# Patient Record
Sex: Female | Born: 1960 | Race: White | Hispanic: No | Marital: Single | Smoking: Former smoker
Health system: Southern US, Community
[De-identification: ages and names within clinical notes are randomized; demographics above are authoritative.]

## PROBLEM LIST (undated history)

## (undated) DIAGNOSIS — R3915 Urgency of urination: Secondary | ICD-10-CM

## (undated) DIAGNOSIS — I340 Nonrheumatic mitral (valve) insufficiency: Secondary | ICD-10-CM

## (undated) DIAGNOSIS — I739 Peripheral vascular disease, unspecified: Secondary | ICD-10-CM

## (undated) DIAGNOSIS — I2510 Atherosclerotic heart disease of native coronary artery without angina pectoris: Secondary | ICD-10-CM

## (undated) DIAGNOSIS — Z8669 Personal history of other diseases of the nervous system and sense organs: Secondary | ICD-10-CM

## (undated) DIAGNOSIS — K219 Gastro-esophageal reflux disease without esophagitis: Secondary | ICD-10-CM

## (undated) DIAGNOSIS — I071 Rheumatic tricuspid insufficiency: Secondary | ICD-10-CM

## (undated) DIAGNOSIS — R350 Frequency of micturition: Secondary | ICD-10-CM

## (undated) DIAGNOSIS — I5032 Chronic diastolic (congestive) heart failure: Secondary | ICD-10-CM

## (undated) DIAGNOSIS — G8929 Other chronic pain: Secondary | ICD-10-CM

## (undated) DIAGNOSIS — F419 Anxiety disorder, unspecified: Secondary | ICD-10-CM

## (undated) DIAGNOSIS — E785 Hyperlipidemia, unspecified: Secondary | ICD-10-CM

## (undated) DIAGNOSIS — F32A Depression, unspecified: Secondary | ICD-10-CM

## (undated) DIAGNOSIS — F329 Major depressive disorder, single episode, unspecified: Secondary | ICD-10-CM

## (undated) DIAGNOSIS — I10 Essential (primary) hypertension: Secondary | ICD-10-CM

## (undated) DIAGNOSIS — M1990 Unspecified osteoarthritis, unspecified site: Secondary | ICD-10-CM

## (undated) DIAGNOSIS — G4733 Obstructive sleep apnea (adult) (pediatric): Secondary | ICD-10-CM

## (undated) DIAGNOSIS — M549 Dorsalgia, unspecified: Secondary | ICD-10-CM

## (undated) DIAGNOSIS — F4310 Post-traumatic stress disorder, unspecified: Secondary | ICD-10-CM

## (undated) DIAGNOSIS — R42 Dizziness and giddiness: Secondary | ICD-10-CM

## (undated) DIAGNOSIS — Z8679 Personal history of other diseases of the circulatory system: Secondary | ICD-10-CM

## (undated) HISTORY — DX: Rheumatic tricuspid insufficiency: I07.1

## (undated) HISTORY — DX: Chronic diastolic (congestive) heart failure: I50.32

## (undated) HISTORY — DX: Depression, unspecified: F32.A

## (undated) HISTORY — DX: Peripheral vascular disease, unspecified: I73.9

## (undated) HISTORY — DX: Atherosclerotic heart disease of native coronary artery without angina pectoris: I25.10

## (undated) HISTORY — DX: Essential (primary) hypertension: I10

## (undated) HISTORY — PX: KNEE ARTHROSCOPY: SUR90

## (undated) HISTORY — DX: Unspecified osteoarthritis, unspecified site: M19.90

## (undated) HISTORY — DX: Anxiety disorder, unspecified: F41.9

## (undated) HISTORY — DX: Major depressive disorder, single episode, unspecified: F32.9

## (undated) HISTORY — DX: Hyperlipidemia, unspecified: E78.5

## (undated) HISTORY — DX: Nonrheumatic mitral (valve) insufficiency: I34.0

## (undated) HISTORY — DX: Personal history of other diseases of the circulatory system: Z86.79

## (undated) HISTORY — DX: Obstructive sleep apnea (adult) (pediatric): G47.33

---

## 1989-11-09 HISTORY — PX: TUBAL LIGATION: SHX77

## 2007-11-10 HISTORY — PX: CARDIAC CATHETERIZATION: SHX172

## 2008-08-10 ENCOUNTER — Inpatient Hospital Stay (HOSPITAL_COMMUNITY): Admission: EM | Admit: 2008-08-10 | Discharge: 2008-08-13 | Payer: Self-pay | Admitting: Emergency Medicine

## 2008-08-13 ENCOUNTER — Encounter (INDEPENDENT_AMBULATORY_CARE_PROVIDER_SITE_OTHER): Payer: Self-pay | Admitting: *Deleted

## 2008-11-09 HISTORY — PX: LUMBAR DISC SURGERY: SHX700

## 2008-12-21 ENCOUNTER — Encounter: Admission: RE | Admit: 2008-12-21 | Discharge: 2008-12-21 | Payer: Self-pay | Admitting: Obstetrics and Gynecology

## 2009-04-15 ENCOUNTER — Encounter: Admission: RE | Admit: 2009-04-15 | Discharge: 2009-04-15 | Payer: Self-pay | Admitting: Family Medicine

## 2009-04-29 ENCOUNTER — Emergency Department (HOSPITAL_COMMUNITY): Admission: EM | Admit: 2009-04-29 | Discharge: 2009-04-30 | Payer: Self-pay | Admitting: Emergency Medicine

## 2009-05-28 ENCOUNTER — Ambulatory Visit: Payer: Self-pay | Admitting: *Deleted

## 2009-05-29 ENCOUNTER — Ambulatory Visit: Payer: Self-pay | Admitting: Vascular Surgery

## 2009-06-04 ENCOUNTER — Ambulatory Visit (HOSPITAL_COMMUNITY): Admission: RE | Admit: 2009-06-04 | Discharge: 2009-06-04 | Payer: Self-pay | Admitting: Psychology

## 2009-06-04 ENCOUNTER — Ambulatory Visit: Payer: Self-pay | Admitting: Surgery

## 2009-06-09 HISTORY — PX: ILIAC ARTERY STENT: SHX1786

## 2009-06-11 ENCOUNTER — Ambulatory Visit (HOSPITAL_COMMUNITY): Admission: RE | Admit: 2009-06-11 | Discharge: 2009-06-11 | Payer: Self-pay | Admitting: Surgery

## 2009-06-11 DIAGNOSIS — I739 Peripheral vascular disease, unspecified: Secondary | ICD-10-CM

## 2009-06-11 HISTORY — DX: Peripheral vascular disease, unspecified: I73.9

## 2009-07-12 ENCOUNTER — Ambulatory Visit: Payer: Self-pay | Admitting: Vascular Surgery

## 2009-07-27 ENCOUNTER — Encounter: Admission: RE | Admit: 2009-07-27 | Discharge: 2009-07-27 | Payer: Self-pay | Admitting: Orthopedic Surgery

## 2010-05-30 ENCOUNTER — Emergency Department (HOSPITAL_COMMUNITY): Admission: EM | Admit: 2010-05-30 | Discharge: 2010-05-30 | Payer: Self-pay | Admitting: Emergency Medicine

## 2010-09-13 ENCOUNTER — Emergency Department (HOSPITAL_COMMUNITY): Admission: EM | Admit: 2010-09-13 | Discharge: 2010-09-13 | Payer: Self-pay | Admitting: Emergency Medicine

## 2011-01-09 ENCOUNTER — Ambulatory Visit (INDEPENDENT_AMBULATORY_CARE_PROVIDER_SITE_OTHER): Payer: Medicare Other | Admitting: Cardiovascular Disease

## 2011-01-09 DIAGNOSIS — R002 Palpitations: Secondary | ICD-10-CM

## 2011-01-13 ENCOUNTER — Ambulatory Visit (INDEPENDENT_AMBULATORY_CARE_PROVIDER_SITE_OTHER): Payer: Medicare Other | Admitting: Nurse Practitioner

## 2011-01-13 DIAGNOSIS — R002 Palpitations: Secondary | ICD-10-CM

## 2011-01-13 DIAGNOSIS — I4891 Unspecified atrial fibrillation: Secondary | ICD-10-CM

## 2011-01-20 LAB — DIFFERENTIAL
Lymphocytes Relative: 33 % (ref 12–46)
Monocytes Absolute: 0.5 10*3/uL (ref 0.1–1.0)
Monocytes Relative: 6 % (ref 3–12)
Neutro Abs: 5.1 10*3/uL (ref 1.7–7.7)

## 2011-01-20 LAB — BASIC METABOLIC PANEL
GFR calc non Af Amer: >60 mL/min (ref >60)
Glucose, Bld: 93 mg/dL (ref 70–99)
Potassium: 4.2 mEq/L (ref 3.5–5.1)
Sodium: 140 mEq/L (ref 135–145)

## 2011-01-20 LAB — CBC
HCT: 46.1 % — ABNORMAL HIGH (ref 36.0–46.0)
Hemoglobin: 15.3 g/dL — ABNORMAL HIGH (ref 12.0–15.0)
MCHC: 33.2 g/dL (ref 30.0–36.0)

## 2011-01-20 LAB — POCT CARDIAC MARKERS
CKMB, poc: <1 ng/mL — ABNORMAL LOW (ref 1.0–8.0)
Myoglobin, poc: 44.5 ng/mL (ref 12–200)
Troponin i, poc: <0.05 ng/mL (ref 0.00–0.09)

## 2011-01-21 ENCOUNTER — Encounter: Payer: Self-pay | Admitting: Cardiovascular Disease

## 2011-01-21 ENCOUNTER — Other Ambulatory Visit (HOSPITAL_COMMUNITY): Payer: Medicare Other

## 2011-01-24 ENCOUNTER — Emergency Department (HOSPITAL_COMMUNITY)
Admission: EM | Admit: 2011-01-24 | Discharge: 2011-01-25 | Disposition: A | Discharge status: Home / Self Care | Payer: Medicare Other | Attending: Emergency Medicine | Admitting: Emergency Medicine

## 2011-01-24 DIAGNOSIS — R112 Nausea with vomiting, unspecified: Secondary | ICD-10-CM | POA: Insufficient documentation

## 2011-01-24 DIAGNOSIS — R1013 Epigastric pain: Secondary | ICD-10-CM | POA: Insufficient documentation

## 2011-01-24 DIAGNOSIS — I4891 Unspecified atrial fibrillation: Secondary | ICD-10-CM | POA: Insufficient documentation

## 2011-01-24 DIAGNOSIS — T50995A Adverse effect of other drugs, medicaments and biological substances, initial encounter: Secondary | ICD-10-CM | POA: Insufficient documentation

## 2011-01-24 DIAGNOSIS — K2970 Gastritis, unspecified, without bleeding: Secondary | ICD-10-CM | POA: Insufficient documentation

## 2011-01-25 LAB — DIFFERENTIAL
Basophils Absolute: 0.1 10*3/uL (ref 0.0–0.1)
Lymphocytes Relative: 34 % (ref 12–46)
Neutro Abs: 5.3 10*3/uL (ref 1.7–7.7)

## 2011-01-25 LAB — CBC
HCT: 41.3 % (ref 36.0–46.0)
Hemoglobin: 14.1 g/dL (ref 12.0–15.0)
MCH: 29.6 pg (ref 26.0–34.0)
MCHC: 34.1 g/dL (ref 30.0–36.0)
MCV: 86.6 fL (ref 78.0–100.0)
Platelets: 220 K/uL (ref 150–400)
RBC: 4.77 MIL/uL (ref 3.87–5.11)
RDW: 13.9 % (ref 11.5–15.5)
WBC: 9 K/uL (ref 4.0–10.5)

## 2011-01-25 LAB — HEPATIC FUNCTION PANEL
AST: 16 U/L (ref 0–37)
Albumin: 3.7 g/dL (ref 3.5–5.2)
Alkaline Phosphatase: 67 U/L (ref 39–117)
Total Bilirubin: <0.1 mg/dL — ABNORMAL LOW (ref 0.3–1.2)
Total Protein: 6.2 g/dL (ref 6.0–8.3)

## 2011-01-25 LAB — POCT I-STAT, CHEM 8
HCT: 43 % (ref 36.0–46.0)
Hemoglobin: 14.6 g/dL (ref 12.0–15.0)
Potassium: 4 mEq/L (ref 3.5–5.1)
Sodium: 137 mEq/L (ref 135–145)

## 2011-02-09 ENCOUNTER — Ambulatory Visit: Payer: Medicare Other | Admitting: Cardiovascular Disease

## 2011-02-09 ENCOUNTER — Encounter: Payer: Self-pay | Admitting: *Deleted

## 2011-02-14 LAB — CREATININE, SERUM
Creatinine, Ser: 0.72 mg/dL (ref 0.4–1.2)
GFR calc non Af Amer: >60 mL/min (ref >60)

## 2011-02-14 LAB — BUN: BUN: 18 mg/dL (ref 6–23)

## 2011-02-14 LAB — POCT I-STAT 4, (NA,K, GLUC, HGB,HCT)
Glucose, Bld: 99 mg/dL (ref 70–99)
HCT: 50 % — ABNORMAL HIGH (ref 36.0–46.0)
Sodium: 139 mEq/L (ref 135–145)

## 2011-02-15 LAB — POCT I-STAT, CHEM 8
Calcium, Ion: 0.96 mmol/L — ABNORMAL LOW (ref 1.12–1.32)
Chloride: 108 mEq/L (ref 96–112)
Creatinine, Ser: 0.8 mg/dL (ref 0.4–1.2)
Glucose, Bld: 96 mg/dL (ref 70–99)
HCT: 46 % (ref 36.0–46.0)
Hemoglobin: 15.6 g/dL — ABNORMAL HIGH (ref 12.0–15.0)
Potassium: 3.6 mEq/L (ref 3.5–5.1)

## 2011-02-16 LAB — DIFFERENTIAL
Basophils Relative: 0 % (ref 0–1)
Lymphocytes Relative: 20 % (ref 12–46)
Lymphs Abs: 2.6 10*3/uL (ref 0.7–4.0)
Monocytes Absolute: 0.6 10*3/uL (ref 0.1–1.0)
Monocytes Relative: 5 % (ref 3–12)
Neutro Abs: 9.5 10*3/uL — ABNORMAL HIGH (ref 1.7–7.7)
Neutrophils Relative %: 74 % (ref 43–77)

## 2011-02-16 LAB — POCT I-STAT, CHEM 8
HCT: 47 % — ABNORMAL HIGH (ref 36.0–46.0)
Hemoglobin: 16 g/dL — ABNORMAL HIGH (ref 12.0–15.0)
Potassium: 3.9 mEq/L (ref 3.5–5.1)
Sodium: 139 mEq/L (ref 135–145)
TCO2: 24 mmol/L (ref 0–100)

## 2011-02-16 LAB — URINE MICROSCOPIC-ADD ON

## 2011-02-16 LAB — CBC
Hemoglobin: 15.2 g/dL — ABNORMAL HIGH (ref 12.0–15.0)
RBC: 4.94 MIL/uL (ref 3.87–5.11)
WBC: 12.9 10*3/uL — ABNORMAL HIGH (ref 4.0–10.5)

## 2011-02-16 LAB — URINALYSIS, ROUTINE W REFLEX MICROSCOPIC
Bilirubin Urine: NEGATIVE
Nitrite: NEGATIVE
Specific Gravity, Urine: 1.026 (ref 1.005–1.030)
pH: 6 (ref 5.0–8.0)

## 2011-02-20 ENCOUNTER — Telehealth: Payer: Self-pay | Admitting: Cardiovascular Disease

## 2011-02-20 NOTE — Telephone Encounter (Signed)
Pt c/o leg pain top of groin and now noticed three lumps in her groin. Pt told to get in with pcp or go to walk in clinic if pcp not available. Pt verbalized understanding. Alfonso Ramus RN

## 2011-03-12 ENCOUNTER — Ambulatory Visit: Payer: Medicare Other | Admitting: Cardiovascular Disease

## 2011-03-24 NOTE — Cardiovascular Report (Signed)
NAME:  Kayla Hopkins, Kayla Hopkins            ACCOUNT NO.:  [id number]   MEDICAL RECORD NO.:  [id number]          PATIENT TYPE:  INP   LOCATION:  3712                         FACILITY:  [facility]   PHYSICIAN:  Elmore Guise., M.D.DATE OF BIRTH:  04/13/1961   DATE OF PROCEDURE:  08/13/2008  DATE OF DISCHARGE:  08/13/2008                            CARDIAC CATHETERIZATION   DATE OF PROCEDURE:  August 13, 2008   Indications for procedure is continued chest pain, multiple cardiac risk  factors, and evaluate for obstructive coronary disease.   PROCEDURE DESCRIPTION:  The patient was brought to the cardiac cath lab  after appropriate informed consent.  She was prepped and draped in  sterile fashion.  Approximately, 10 mL of 1% lidocaine was used for  local anesthesia.  A 5-French sheath was placed in the right femoral  artery without difficulty.  Coronary angiography, LV angiography was  then performed.  The patient tolerated the procedure well and  transferred from the cardiac cath lab in stable condition.   FINDINGS:  1. Left Main:  Normal.  2. LAD:  Normal.  3. D1:  Normal.  4. Large ramus intermedius:  Normal.  5. LCX:  Small, nondominant, and normal appearing.  6. RCA:  Dominant and normal appearing.  7. LV:  EF 65%.  No wall motion abnormalities.  LVDP is 17 mmHg.   IMPRESSION:  1. Normal-appearing coronary arteries.  2. Preserved left ventricular systolic function, ejection fraction      65%, left ventricular diastolic pressure is 17 mmHg.   PLAN:  At this time, I would recommend aggressive risk factor  modification as indicated, and we will continue aspirin and statin  therapy.      Elmore Guise., M.D.  Electronically Signed     TWK/MEDQ  D:  08/13/2008  T:  08/14/2008  Job:  [id number]

## 2011-03-24 NOTE — Consult Note (Signed)
NAME:  Kayla Hopkins, WOOLMAN            ACCOUNT NO.:  [id number]   MEDICAL RECORD NO.:  [id number]          PATIENT TYPE:  INP   LOCATION:  2922                         FACILITY:  [facility]   PHYSICIAN:  Elmore Guise., M.D.DATE OF BIRTH:  Jan 03, 1961   DATE OF CONSULTATION:  DATE OF DISCHARGE:                                 CONSULTATION   REASON FOR CONSULTATION:  Chest pain.   PHYSICIAN REQUESTING CONSULT:  Norton Blizzard, MD   HISTORY OF PRESENT ILLNESS:  Ms. Majkowski is a 50 year old white female  with past medical history of tobacco dependence, dyslipidemia, and  family history of early heart disease who presents with substernal chest  pressure.  The patient reports progressive dyspnea on exertion over the  last year; however, chest pain only starting within the last 24 hours.  Today, the patient had acute onset of left-sided chest pain.  Initially,  she described it as sharp and then a pressure like someone sitting on  my chest.  It was associated with shortness of breath and diaphoresis.  She went to lay down, her pain continued.  She had no improvement until  she was given nitroglycerin.  Her pain had totally relieved, however,  then returned.  She was given a second nitroglycerin with total relief,  and now, she is on Nitropaste with no recurrence in her symptoms.  She  is now chest pain free.  She denies any recent fever, chills, nausea,  vomiting, or diarrhea.  She does report she is very active at home.  She  has occasional palpitations, but this was with activity.  She notes her  heart pounding and skipping at times.  She denies any dizziness or  presyncope associated with these symptoms.  She does smoke at least 1  pack per day and has done so for the last 30 years.  No significant  alcohol intake.   REVIEW OF SYSTEMS:  As per HPI.  All other systems negative.   Current medications are none.   ALLERGIES:  None.   Family history is positive for heart disease.   SOCIAL HISTORY:  She has 30-pack year history of tobacco but no alcohol.  She is moved from [place].   PHYSICAL EXAMINATION:  VITAL SIGNS:  She is afebrile.  Blood pressure  140/70, heart rate is in the 70s, and sating 95% on room air.  GENERAL:  She is very pleasant, middle-aged white female, alert and  oriented x4, in no acute distress.  HEENT:  Appear normal.  NECK:  Supple.  No lymphadenopathy.  A 2+ carotids.  No JVD and no  bruits.  LUNGS:  Clear.  HEART:  Regular with normal S1 and S2.  No murmur, gallops, or rubs.  ABDOMEN:  Soft, nontender, and nondistended.  No rebound or guarding.  EXTREMITIES:  Warm with 2+ pulses and no edema.   Her chest x-ray shows no acute cardiopulmonary disease.  Her ECG shows  normal sinus rhythm at 77 per minute with no ST- or T-wave changes  noted.  Her blood work shows a myoglobin of 45, MB of less than 1.0, and  troponin  I of less than 0.05.  Hemoglobin was 15.  BUN and creatinine of  12 and 0.9 and potassium level 4.0.   IMPRESSION:  1. Chest pain, worrisome for unstable angina.  2. Tobacco dependence.  3. Family history of early heart disease.  4. Dyslipidemia.   PLAN:  1. At this time, I agree with her current treatment.  I would continue      aspirin, heparin, Nitropaste, and add metoprolol 12.5 mg twice      daily.  Check serial cardiac markers, as well as check a.m. lipids.      I will be glad to follow her throughout her hospitalization.  At      this time, the patient likely will need cardiac catheterization for      evaluation of ischemic heart disease.  I did discuss this with her      and her family at length.      Elmore Guise., M.D.  Electronically Signed     TWK/MEDQ  D:  08/10/2008  T:  08/11/2008  Job:  [id number]

## 2011-03-24 NOTE — Op Note (Signed)
NAME:  Kayla Hopkins, Kayla Hopkins            ACCOUNT NO.:  [id number]   MEDICAL RECORD NO.:  [id number]          PATIENT TYPE:  AMB   LOCATION:  SDS                          FACILITY:  [facility]   PHYSICIAN:  Juleen China IV, MDDATE OF BIRTH:  [birth year]   DATE OF PROCEDURE:  06/11/2009  DATE OF DISCHARGE:  06/11/2009                               OPERATIVE REPORT   PREOPERATIVE DIAGNOSIS:  Bilateral claudication.   POSTOPERATIVE DIAGNOSIS:  Bilateral claudication.   PROCEDURES PERFORMED:  1. Ultrasound access, left femoral artery.  2. Ultrasound access, right femoral artery.  3. Stent, right common iliac artery.  4. Stent, left common iliac artery.  5. Right lower extremity runoff.   INDICATION:  This is a 50 year old female with bilateral lifestyle-  limiting claudication.  She underwent arteriogram last week, which  revealed significant proximal common iliac disease.  The sheath had been  inadvertently withdrawn during her runoff last week and therefore she  comes back in today for her intervention and right leg runoff.   PROCEDURE:  The patient was identified in the holding area and taken to  room H.  She was placed supine on the table.  Both groins were prepped  and draped in standard sterile fashion.  A time-out was called.  The  right and left femoral artery were evaluated with ultrasound and found  to be widely patent.  There was some residual hematoma in the right  groin.  Both common femoral arteries were accessed under ultrasound  guidance with a micropuncture needle.  A 0.18 Mandrill wire was advanced  bilaterally into the iliac arterial system.  Under fluoroscopic  visualization, micropuncture sheaths were placed.  Over a Rosen wire, a  7-French bright tip sheaths were placed into the distal abdominal aorta.  Aortogram was performed, which revealed the patient's disease which had  been documented at her previous arteriogram.  Two Cordis genesis 8 x 24  balloon expandable  stents were prepared on the back table and advanced  through the sheath.  They were positioned in the distal abdominal aorta.  Kissing iliac stents were deployed by inflating the balloon to 8  atmospheres.  Follow up arteriogram was then performed.  This revealed  significantly improved flow through the stents.  There was a non-flow  limiting dissection in the left common iliac artery just below the  distal point of the stent.  Multiple images at spanning time of  approximately 15 minutes were taken, this did not appear to be flow-  limiting and the area was increasing in size.  Therefore, I elected to  not intervene in this area.  Finally, right leg runoff was performed via  retrograde injection through the sheath of the right groin.   Right leg angiogram:  The right common femoral artery is widely patent  and the right profunda femoral artery is widely patent.  The right  superficial femoral artery is widely patent, right popliteal artery is  widely patent.  The patient has 3-vessel runoff to right foot.   After all images and procedures above were performed, wires were removed  and the patient had been  taken to the holding area.  Once her  coagulation profile was correct thus she was heparinized for the  interventional portion of the procedure.   IMPRESSION:  1. Successful placement of kissing iliac stents using a Cordis genesis      8 x 24 balloon expandable stent.  2. No significant right lower extremity arterial insufficiency.      Jorge Ny, MD  Electronically Signed     VWB/MEDQ  D:  06/11/2009  T:  06/12/2009  Job:  [id number]

## 2011-03-24 NOTE — Op Note (Signed)
NAME:  Kayla Hopkins, Kayla Hopkins            ACCOUNT NO.:  [id number]   MEDICAL RECORD NO.:  [id number]          PATIENT TYPE:  AMB   LOCATION:  SDS                          FACILITY:  [facility]   PHYSICIAN:  Juleen China IV, MDDATE OF BIRTH:  [birth year]   DATE OF PROCEDURE:  06/04/2009  DATE OF DISCHARGE:  06/04/2009                               OPERATIVE REPORT   SURGEON:  1. Durene Cal IV, MD   PREOPERATIVE DIAGNOSIS:  Bilateral claudication.   POSTOPERATIVE DIAGNOSIS:  Bilateral claudication.   PROCEDURES PERFORMED:  1. Ultrasound-access right femoral artery.  2. Abdominal aortogram.  3. Left lower extremity runoff.  4. Second-order catheterization.   INDICATIONS:  This is a 50 year old female with lifestyle-limiting  claudication with decreased ankle-brachial indices.  She comes in today  for arteriogram, possible intervention.   PROCEDURE:  The patient was identified in the holding area, taken to  room #8, placed supine on table.  Bilateral groins were prepped and  draped in the standard sterile fashion and a time-out was called.  The  right femoral artery was evaluated with ultrasound and found to be  widely patent.  It was accessed under ultrasound guidance with an 18-  gauge needle and 0.035 wire was advanced into the aorta under  fluoroscopic visualization and a 5-French sheath was placed.  Over the  wire, an Omni flush catheter was advanced to the level of L1 and  abdominal aortogram was obtained.  Next, catheters pulled down to the  aortic bifurcation.  Pelvic angiogram was obtained using the Omni flush  catheter and a Bentson wire.  The aortic bifurcation was crossed.  Catheter was placed in left external iliac artery and left leg runoff  was obtained.   FINDINGS:  Aortogram:  The visualized portions of the suprarenal  abdominal aorta showed minimal disease.  There were single renal  arteries bilaterally, which are widely patent.  The infrarenal abdominal  aorta is  widely patent.   Pelvic angiogram:  There is a high-grade stenosis at the origin of the  right common iliac artery.  There is a high-grade stenosis within the  left common iliac artery near its origin.  Both hypogastric arteries are  widely patent.  A large median sacral artery is visualized.  Bilateral  external iliac arteries are widely patent.   Left lower extremity:  The left common femoral artery is widely patent  without significant disease.  Left profunda femoral artery is widely  patent.  The left superficial femoral artery is patent throughout its  course.  The popliteal artery is widely patent.  The patient has 3-  vessel runoff.   In preparation for right lower extremity runoff, the sheath was  inadvertently withdrawn out of the artery, it was unable to be replaced.  For that reason, I have elected to bring the patient back in  approximately 1 week for right lower extremity runoff and bilateral  iliac stenting.   Manual pressure was held.  The patient was hemodynamically stable.  There was not evidence of significant hematoma.   IMPRESSION:  Significant bilateral iliac disease.  Jorge Ny, MD  Electronically Signed     VWB/MEDQ  D:  06/04/2009  T:  06/05/2009  Job:  [id number]

## 2011-03-24 NOTE — Consult Note (Signed)
NEW PATIENT CONSULTATION   Kayla Hopkins, Kayla Hopkins  DOB:  1961-08-11                                       05/29/2009  EAVWU#:[id number]   The patient presents today for evaluation of lower extremity discomfort.  She is a pleasant 50 year old white female with progressive pain over  the past year in her lower extremities.  She has two components of the  discomfort.  One is with walking and she reports with walking any  distance she develops cramping sensation in her buttock muscles  extending down through her thighs and in to her calves.  This is  relieved with rest.  She also has a second component of with prolonged  standing she has some aching sensation in her calves and thighs.  She  has no history of tissue loss.   PAST MEDICAL HISTORY:  Is significant for elevated cholesterol.  She was  admitted in October with chest pain and had a cardiac workup which  according to the patient was negative.  She did have a cardiac cath  which was negative.  She does have a positive family history of  premature atherosclerotic disease in her mother and father.   SOCIAL HISTORY:  She is single with three children.  She is not retired.  She unfortunately does smoke one pack of cigarettes per day and does not  drink alcohol on a regular basis.   REVIEW OF SYSTEMS:  Positive for loss of appetite.  Her weight is  reported 173 pounds.  She is 5 feet 6 inches tall.  She denies  pulmonary, GI dysfunction.  She does have urinary frequency, dizziness,  headaches and history of seizure and does have arthritic joint pain,  muscle pain.  She does have a history of depression.   MEDICATION ALLERGIES:  None.   CURRENT MEDICATIONS:  __________for depression.   PHYSICAL EXAM:  General:  A well-nourished, well-developed white female  appearing stated age of 71.  Vital signs:  Her blood pressure is 160/89,  pulse 82, respirations 18.  Her radial pulses are 2+ bilaterally.  She  is grossly  intact neurologically.  Her carotid arteries are without  bruits.  Heart:  Regular rate and rhythm.  Chest:  Clear bilaterally.  She does have palpable but diminished left femoral pulses and absent  popliteal and distal pulses.   She underwent noninvasive vascular laboratory studies in our office  revealing an ankle arm index of 0.8 on the right and 0.73 on the left  with monophasic and biphasic waveforms bilaterally.   I discussed the significance of this with the patient.  I explained that  the exercise component of her discomfort in all likelihood is related to  arterial insufficiency with relatively typical claudication symptoms.  She reports that she is unable to tolerate this level of discomfort and  therefore we have recommended she undergo arteriography for further  evaluation of this.  This has been scheduled with Dr. Myra Gianotti on  06/04/2009 at [facility].  I explained that if she does indeed  have iliac occlusive disease that she may be a good candidate for  angioplasty.  We will make this determination at the time of her  procedure.   Larina Earthly, M.D.  Electronically Signed   TFE/MEDQ  D:  05/29/2009  T:  05/30/2009  Job:  [id number]   cc:  Elmore Guise., M.D.

## 2011-03-24 NOTE — Assessment & Plan Note (Signed)
OFFICE VISIT   Kayla Hopkins, Kayla Hopkins  DOB:  Dec 25, 1960                                       07/12/2009  CHART#:[id number]   The patient presents today for followup today after arteriogram and  bilateral common iliac artery stenting by Dr. Myra Gianotti at [facility] on 06/11/2009.  She did have severe common iliac stenoses  bilaterally and underwent kissing stents.  She has done quite well  following the procedure and reports complete resolution of all hip and  buttock claudication and also of the aching and tired discomfort with  prolonged standing.  Her femoral pulses are 2+ as are her dorsalis pedis  pulses.  She does not have any bruising or evidence of false aneurysm.   She underwent noninvasive vascular laboratory studies in our office  today and this reveals normal ankle arm index bilaterally at 1.0 up from  0.80 on the right and 0.73 on the left.  She is quite pleased with her  initial treatment outcome as am I.  We will see her again in our  vascular lab protocol.   Larina Earthly, M.D.  Electronically Signed   TFE/MEDQ  D:  07/12/2009  T:  07/13/2009  Job:  [id number]   cc:   Jorge Ny, MD  Elmore Guise., M.D.

## 2011-03-27 NOTE — Discharge Summary (Signed)
NAME:  Kayla Hopkins, Kayla Hopkins            ACCOUNT NO.:  [id number]   MEDICAL RECORD NO.:  [id number]          PATIENT TYPE:  INP   LOCATION:  3712                         FACILITY:  [facility]   PHYSICIAN:  Elmore Guise., M.D.DATE OF BIRTH:  [birth year]   DATE OF ADMISSION:  08/10/2008  DATE OF DISCHARGE:  08/13/2008                               DISCHARGE SUMMARY   DISCHARGE DIAGNOSES:  1. Chest pain.  2. Normal coronary arteries by cardiac catheterization.  3. Dyslipidemia.  4. History of tobacco dependence.   HISTORY OF PRESENT ILLNESS:  Kayla Hopkins is a pleasant 50 year old  white female with past medical history of tobacco dependence,  dyslipidemia, and family history of early heart disease presented to the  hospital with substernal chest pain.  She also noted progressive dyspnea  on exertion over the last year.  She was admitted for further evaluation  and treatment.   HOSPITAL COURSE:  The patient's hospital course was uncomplicated.  She  ruled out for myocardial infarction.  She continued to have off and on  chest pain throughout her hospital course and was referred for cardiac  catheterization.  This was performed on August 13, 2008, showing normal  left main, normal LAD, normal diagonal, normal ramus intermedius, normal  circumflex, and normal RCA.  Results of her cath were discussed with her  at length.  Following her catheterization, she did well.  She was up and  ambulatory without any significant problems.  She was discharged home  later that day.  Her discharge medications include aspirin 81 mg daily  and Zocor 40 mg daily.  She was to have routine post cath restrictions  and increase her activity slowly.  We also discussed about complete  tobacco cessation with her at length.   Her followup appointment will be with Dr. Reyes Ivan at [facility] in 2 weeks.  She is to call the office if she has any further  problems.      Elmore Guise., M.D.  Electronically Signed     TWK/MEDQ  D:  10/01/2008  T:  10/02/2008  Job:  [id number]

## 2011-04-30 ENCOUNTER — Emergency Department (HOSPITAL_COMMUNITY)
Admission: EM | Admit: 2011-04-30 | Discharge: 2011-04-30 | Disposition: A | Discharge status: Home / Self Care | Payer: Medicare Other | Attending: Emergency Medicine | Admitting: Emergency Medicine

## 2011-04-30 ENCOUNTER — Emergency Department (HOSPITAL_COMMUNITY): Payer: Medicare Other

## 2011-04-30 DIAGNOSIS — M545 Low back pain, unspecified: Secondary | ICD-10-CM | POA: Insufficient documentation

## 2011-04-30 DIAGNOSIS — X503XXA Overexertion from repetitive movements, initial encounter: Secondary | ICD-10-CM | POA: Insufficient documentation

## 2011-04-30 DIAGNOSIS — E669 Obesity, unspecified: Secondary | ICD-10-CM | POA: Insufficient documentation

## 2011-04-30 DIAGNOSIS — M79609 Pain in unspecified limb: Secondary | ICD-10-CM | POA: Insufficient documentation

## 2011-04-30 DIAGNOSIS — Z79899 Other long term (current) drug therapy: Secondary | ICD-10-CM | POA: Insufficient documentation

## 2011-04-30 DIAGNOSIS — I4891 Unspecified atrial fibrillation: Secondary | ICD-10-CM | POA: Insufficient documentation

## 2011-04-30 DIAGNOSIS — M5430 Sciatica, unspecified side: Secondary | ICD-10-CM | POA: Insufficient documentation

## 2011-04-30 DIAGNOSIS — R209 Unspecified disturbances of skin sensation: Secondary | ICD-10-CM | POA: Insufficient documentation

## 2011-04-30 DIAGNOSIS — Y93H2 Activity, gardening and landscaping: Secondary | ICD-10-CM | POA: Insufficient documentation

## 2011-06-26 ENCOUNTER — Encounter: Payer: Self-pay | Admitting: Cardiovascular Disease

## 2011-06-26 ENCOUNTER — Ambulatory Visit (INDEPENDENT_AMBULATORY_CARE_PROVIDER_SITE_OTHER): Payer: Medicare Other | Admitting: Cardiovascular Disease

## 2011-06-26 VITALS — BP 158/96 | HR 54 | Ht 66.0 in | Wt 177.6 lb

## 2011-06-26 DIAGNOSIS — E785 Hyperlipidemia, unspecified: Secondary | ICD-10-CM

## 2011-06-26 DIAGNOSIS — I4891 Unspecified atrial fibrillation: Secondary | ICD-10-CM

## 2011-06-26 DIAGNOSIS — I10 Essential (primary) hypertension: Secondary | ICD-10-CM

## 2011-06-26 DIAGNOSIS — R002 Palpitations: Secondary | ICD-10-CM

## 2011-06-26 DIAGNOSIS — M549 Dorsalgia, unspecified: Secondary | ICD-10-CM

## 2011-06-26 MED ORDER — METOPROLOL SUCCINATE ER 50 MG PO TB24: 50.0000 mg | ORAL_TABLET | Freq: Every day | ORAL | Status: DC

## 2011-06-26 MED ORDER — ATORVASTATIN CALCIUM 40 MG PO TABS: 40.0000 mg | ORAL_TABLET | Freq: Every day | ORAL | Status: DC

## 2011-06-26 NOTE — Patient Instructions (Addendum)
Return for blood work for your cholesterol levels in 3 months.  You're at low risk for your upcoming back surgery.

## 2011-06-26 NOTE — Assessment & Plan Note (Signed)
Her last lipid levels were very elevated. Her medical doctor increased her lovastatin but I don't think that that'll be strong enough. We'll try her on Lipitor 40 mg a day.  We'll recheck her lipids again in 3 months.

## 2011-06-26 NOTE — Assessment & Plan Note (Signed)
Her blood pressure is well-controlled. She needs a refill on her metoprolol.

## 2011-06-26 NOTE — Assessment & Plan Note (Addendum)
Patient is to have back surgery. She's not having any type of cardiac complications. She is at low risk for any complications with her back surgery.

## 2011-06-26 NOTE — Progress Notes (Signed)
Kayla Hopkins Date of Birth  1961/03/28 [facility] [address]     [address] [place], [place]  [address] [phone]  Fax  [phone]  History of Present Illness:  Kayla Hopkins is a 50 year old female with a history of  palpitations and headaches. She also has a history of hypercholesterolemia.  She's had paroxysmal atrial fibrillation and was started on Pradaxa.   She developed some gastritis with the Pradaxa and was ultimately discontinued.   She overall feels fairly well. She still has some palpitations when she eats lots of chocolate.    She's had back surgery. She's here for clearance for that surgery.  She's not having any cardiac complications.  Current Outpatient Prescriptions on File Prior to Visit  Medication Sig Dispense Refill  . aspirin 81 MG tablet Take 81 mg by mouth daily.        . metoprolol (TOPROL-XL) 50 MG 24 hr tablet Take 50 mg by mouth daily.        . propranolol (INDERAL) 10 MG tablet Take 10 mg by mouth 3 (three) times daily as needed.        . dabigatran (PRADAXA) 150 MG CAPS Take 150 mg by mouth every 12 (twelve) hours.          Allergies  Allergen Reactions  . Simvastatin     Leg pains     Past Medical History  Diagnosis Date  . Arrhythmia     afib  . Hypertension   . Peripheral vascular disease   . Hyperlipidemia     Past Surgical History  Procedure Date  . Cardiac catheterization     2009    History  Smoking status  . Former Smoker  . Quit date: 09/09/2010  Smokeless tobacco  . Not on file    History  Alcohol Use No    Family History  Problem Relation Age of Onset  . Hypertension Mother   . Heart disease Father   . Heart attack Father   . Hypertension Brother     Reviw of Systems:  Reviewed in the HPI.  All other systems are negative.  Physical Exam: BP 158/96  Pulse 54  Ht 5\' 6"  (1.676 m)  Wt 177 lb 9.6 oz (80.559 kg)  BMI 28.67 kg/m2 The patient is alert and oriented x  3.  The mood and affect are normal.   Skin: warm and dry.  Color is normal.    HEENT:   the sclera are nonicteric.  The mucous membranes are moist.  The carotids are 2+ without bruits.  There is no thyromegaly.  There is no JVD.    Lungs: clear.  The chest wall is non tender.    Heart: regular rate with a normal S1 and S2.  There are no murmurs, gallops, or rubs. The PMI is not displaced.     Abdomen: good bowel sounds.  There is no guarding or rebound.  There is no hepatosplenomegaly or tenderness.  There are no masses.   Extremities:  no clubbing, cyanosis, or edema.  The legs are without rashes.  The distal pulses are intact.   Neuro:  Cranial nerves II - XII are intact.  Motor and sensory functions are intact.    The gait is normal.  ECG: Sinus bradycardia.  Assessment / Plan:

## 2011-08-03 ENCOUNTER — Telehealth: Payer: Self-pay | Admitting: Cardiovascular Disease

## 2011-08-03 NOTE — Telephone Encounter (Signed)
Please fa

## 2011-08-03 NOTE — Telephone Encounter (Signed)
Faxed over last OV Note, EKG, and ECHO, that were available today.

## 2011-08-03 NOTE — Telephone Encounter (Signed)
Please fax last OV, EKG, ECHO, and STRESS, if at all available.

## 2011-08-11 ENCOUNTER — Encounter (HOSPITAL_COMMUNITY): Payer: Medicare Other

## 2011-08-11 ENCOUNTER — Ambulatory Visit (HOSPITAL_COMMUNITY)
Admission: RE | Admit: 2011-08-11 | Discharge: 2011-08-11 | Disposition: A | Discharge status: Home / Self Care | Payer: Medicare Other | Source: Ambulatory Visit | Attending: Orthopedic Surgery | Admitting: Orthopedic Surgery

## 2011-08-11 ENCOUNTER — Other Ambulatory Visit (HOSPITAL_COMMUNITY): Payer: Self-pay | Admitting: Orthopedic Surgery

## 2011-08-11 DIAGNOSIS — Z01812 Encounter for preprocedural laboratory examination: Secondary | ICD-10-CM | POA: Insufficient documentation

## 2011-08-11 DIAGNOSIS — M545 Low back pain, unspecified: Secondary | ICD-10-CM | POA: Insufficient documentation

## 2011-08-11 DIAGNOSIS — M5126 Other intervertebral disc displacement, lumbar region: Secondary | ICD-10-CM

## 2011-08-11 DIAGNOSIS — I10 Essential (primary) hypertension: Secondary | ICD-10-CM | POA: Insufficient documentation

## 2011-08-11 DIAGNOSIS — I4891 Unspecified atrial fibrillation: Secondary | ICD-10-CM | POA: Insufficient documentation

## 2011-08-11 DIAGNOSIS — Z01818 Encounter for other preprocedural examination: Secondary | ICD-10-CM | POA: Insufficient documentation

## 2011-08-11 DIAGNOSIS — F17200 Nicotine dependence, unspecified, uncomplicated: Secondary | ICD-10-CM | POA: Insufficient documentation

## 2011-08-11 LAB — POCT I-STAT, CHEM 8
BUN: 12
Chloride: 107
Creatinine, Ser: 0.9
Glucose, Bld: 93
HCT: 44
Potassium: 4

## 2011-08-11 LAB — URINALYSIS, ROUTINE W REFLEX MICROSCOPIC
Glucose, UA: NEGATIVE mg/dL
Leukocytes, UA: NEGATIVE
pH: 5 (ref 5.0–8.0)

## 2011-08-11 LAB — COMPREHENSIVE METABOLIC PANEL
ALT: 15
ALT: 12 U/L (ref 0–35)
AST: 23
AST: 16 U/L (ref 0–37)
CO2: 26
Calcium: 9.6 mg/dL (ref 8.4–10.5)
Chloride: 108
GFR calc Af Amer: >60
GFR calc non Af Amer: >60
Sodium: 140
Sodium: 138 mEq/L (ref 135–145)
Total Bilirubin: 1.2
Total Protein: 7.1 g/dL (ref 6.0–8.3)

## 2011-08-11 LAB — BASIC METABOLIC PANEL
BUN: 15
CO2: 28
CO2: 29
Calcium: 9
Chloride: 102
Chloride: 103
Creatinine, Ser: 0.82
GFR calc Af Amer: >60
Glucose, Bld: 94
Potassium: 4.2
Sodium: 138
Sodium: 139

## 2011-08-11 LAB — LIPID PANEL
Cholesterol: 220 — ABNORMAL HIGH
LDL Cholesterol: 147 — ABNORMAL HIGH
Total CHOL/HDL Ratio: 6.5

## 2011-08-11 LAB — CBC
HCT: 40.9
Hemoglobin: 13.4
Hemoglobin: 13.4
Hemoglobin: 13.5
Hemoglobin: 13.8
MCHC: 33.5
MCHC: 33.7
MCV: 91.3
MCV: 91.5
MCV: 89 fL (ref 78.0–100.0)
Platelets: 224 10*3/uL (ref 150–400)
RBC: 4.22
RBC: 4.36
RBC: 4.39
RBC: 5.11 MIL/uL (ref 3.87–5.11)
RDW: 13.3
WBC: 8.3
WBC: 9.1
WBC: 8.7 10*3/uL (ref 4.0–10.5)

## 2011-08-11 LAB — HEPARIN LEVEL (UNFRACTIONATED): Heparin Unfractionated: 0.7

## 2011-08-11 LAB — DIFFERENTIAL
Eosinophils Absolute: 0.1 10*3/uL (ref 0.0–0.7)
Lymphs Abs: 3.4
Lymphs Abs: 2.5 10*3/uL (ref 0.7–4.0)
Monocytes Absolute: 0.6
Monocytes Relative: 7
Neutro Abs: 4.3
Neutro Abs: 5.6 10*3/uL (ref 1.7–7.7)
Neutrophils Relative %: 51
Neutrophils Relative %: 65 % (ref 43–77)

## 2011-08-11 LAB — CARDIAC PANEL(CRET KIN+CKTOT+MB+TROPI)
CK, MB: 0.6
Relative Index: INVALID
Relative Index: INVALID
Total CK: 37
Total CK: 52

## 2011-08-11 LAB — PROTIME-INR: Prothrombin Time: 13.5 seconds (ref 11.6–15.2)

## 2011-08-11 LAB — D-DIMER, QUANTITATIVE: D-Dimer, Quant: 0.78 — ABNORMAL HIGH

## 2011-08-11 LAB — APTT: aPTT: 34 seconds (ref 24–37)

## 2011-08-11 LAB — URINE MICROSCOPIC-ADD ON

## 2011-08-11 LAB — CK TOTAL AND CKMB (NOT AT ARMC)
Relative Index: INVALID
Total CK: 53

## 2011-08-11 LAB — SURGICAL PCR SCREEN: Staphylococcus aureus: NEGATIVE

## 2011-08-11 LAB — POCT CARDIAC MARKERS: Troponin i, poc: <0.05

## 2011-08-11 LAB — ABO/RH: ABO/RH(D): A POS

## 2011-08-13 LAB — TYPE AND SCREEN

## 2011-08-14 ENCOUNTER — Ambulatory Visit (HOSPITAL_COMMUNITY): Admission: RE | Admit: 2011-08-14 | Payer: Medicare Other | Source: Ambulatory Visit | Admitting: Orthopedic Surgery

## 2011-08-25 ENCOUNTER — Other Ambulatory Visit: Payer: Self-pay | Admitting: Orthopedic Surgery

## 2011-08-25 ENCOUNTER — Encounter (HOSPITAL_COMMUNITY): Payer: Medicare Other

## 2011-08-25 LAB — URINALYSIS, ROUTINE W REFLEX MICROSCOPIC
Glucose, UA: NEGATIVE mg/dL
Ketones, ur: NEGATIVE mg/dL
Leukocytes, UA: NEGATIVE
Protein, ur: NEGATIVE mg/dL

## 2011-08-25 LAB — COMPREHENSIVE METABOLIC PANEL
ALT: 17 U/L (ref 0–35)
AST: 15 U/L (ref 0–37)
Albumin: 3.8 g/dL (ref 3.5–5.2)
CO2: 27 mEq/L (ref 19–32)
Calcium: 9.7 mg/dL (ref 8.4–10.5)
Chloride: 99 mEq/L (ref 96–112)
Creatinine, Ser: 0.75 mg/dL (ref 0.50–1.10)
GFR calc non Af Amer: >90 mL/min (ref >90)
Sodium: 135 mEq/L (ref 135–145)

## 2011-08-25 LAB — DIFFERENTIAL
Basophils Relative: 1 % (ref 0–1)
Eosinophils Absolute: 0.2 10*3/uL (ref 0.0–0.7)
Neutrophils Relative %: 57 % (ref 43–77)

## 2011-08-25 LAB — PROTIME-INR: Prothrombin Time: 13.2 seconds (ref 11.6–15.2)

## 2011-08-25 LAB — CBC
MCH: 30.5 pg (ref 26.0–34.0)
Platelets: 249 10*3/uL (ref 150–400)
RBC: 5.28 MIL/uL — ABNORMAL HIGH (ref 3.87–5.11)
WBC: 8.5 10*3/uL (ref 4.0–10.5)

## 2011-08-25 LAB — URINE MICROSCOPIC-ADD ON

## 2011-08-25 LAB — APTT: aPTT: 32 seconds (ref 24–37)

## 2011-08-27 ENCOUNTER — Inpatient Hospital Stay (HOSPITAL_COMMUNITY): Payer: Medicare Other

## 2011-08-27 ENCOUNTER — Ambulatory Visit (HOSPITAL_COMMUNITY)
Admission: RE | Admit: 2011-08-27 | Discharge: 2011-08-29 | Disposition: A | Discharge status: Home / Self Care | Payer: Medicare Other | Source: Ambulatory Visit | Attending: Orthopedic Surgery | Admitting: Orthopedic Surgery

## 2011-08-27 ENCOUNTER — Other Ambulatory Visit: Payer: Self-pay | Admitting: Orthopedic Surgery

## 2011-08-27 DIAGNOSIS — M6289 Other specified disorders of muscle: Secondary | ICD-10-CM | POA: Insufficient documentation

## 2011-08-27 DIAGNOSIS — Z79899 Other long term (current) drug therapy: Secondary | ICD-10-CM | POA: Insufficient documentation

## 2011-08-27 DIAGNOSIS — Z01812 Encounter for preprocedural laboratory examination: Secondary | ICD-10-CM | POA: Insufficient documentation

## 2011-08-27 DIAGNOSIS — M545 Low back pain, unspecified: Secondary | ICD-10-CM | POA: Insufficient documentation

## 2011-08-27 DIAGNOSIS — M5126 Other intervertebral disc displacement, lumbar region: Secondary | ICD-10-CM | POA: Insufficient documentation

## 2011-08-27 DIAGNOSIS — M48061 Spinal stenosis, lumbar region without neurogenic claudication: Principal | ICD-10-CM | POA: Insufficient documentation

## 2011-08-27 LAB — GLUCOSE, CAPILLARY: Glucose-Capillary: 125 mg/dL — ABNORMAL HIGH (ref 70–99)

## 2011-08-27 LAB — TYPE AND SCREEN
ABO/RH(D): A POS
Antibody Screen: NEGATIVE

## 2011-08-28 LAB — CBC
Hemoglobin: 13.1 g/dL (ref 12.0–15.0)
MCH: 30.3 pg (ref 26.0–34.0)
MCV: 89.8 fL (ref 78.0–100.0)
RBC: 4.32 MIL/uL (ref 3.87–5.11)

## 2011-08-28 LAB — GLUCOSE, CAPILLARY: Glucose-Capillary: 117 mg/dL — ABNORMAL HIGH (ref 70–99)

## 2011-08-31 NOTE — Op Note (Signed)
NAMEMarland Kitchen  DORTHEY, DEPACE            ACCOUNT NO.:  [id number]  MEDICAL RECORD NO.:  [id number]  LOCATION:  1616                         FACILITY:  [facility]  PHYSICIAN:  Georges Lynch. Whitni Pasquini, M.D.DATE OF BIRTH:  [birth year]  DATE OF PROCEDURE:  08/27/2011 DATE OF DISCHARGE:                              OPERATIVE REPORT   SURGEON:  Georges Lynch. Darrelyn Hillock, M.D.  ASSISTANT:  Marlowe Kays, M.D.  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 All of her symptoms were on the left.  She had a partial foot drop on the left.  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  All of her symptoms were on the left.  She had a partial foot drop on the left.  OPERATION: 1. Decompressive lumbar laminectomy for severe lateral recess stenosis     at L4-5 on the left. 2. Foraminotomy for the L4 root. 3. Foraminotomy for the L5 root. 4. Microdiskectomy L4-5 on the left for foraminal disk.  PROCEDURE IN DETAIL:  Under general anesthesia, routine orthopedic prep and draping of the back was carried out.  The appropriate time-out was carried out in the operating room prior to any incisions.  Also, prior to bringing the patient back to surgery, I marked the appropriate left side of the back where all her symptoms and her foot drop was.  After sterile prepping and draping and all was carried out and time-out, we first marked the back with 2 spinal needles and x-ray was taken.  Also, at this time, an incision then was made over the L4-5 space.  Bleeders identified and cauterized.  The muscle was stripped bilaterally.  We did preserve the spinous process of L4.  We then inserted the [facility] retractors.  We then went down the left side of L4-5, which was extremely tight.  Microscope was brought in.  We did a complete  lateral recess decompression way out laterally because of the foraminal disk. We cauterized lateral recess veins.  The dura in the L5 root was quite scarred down with the ligamentum flavum.  We peeled as much of that as we safely could take off the root.  After that, the root now was extremely free, we did a nice foraminotomy for the 5 root.  We could easily pass a hockey-stick out the foramen now and easily move the root over.  We went up above and did a foraminotomy above as well.  At that time, we identified put a needle at the disk space area L4-5, an x-ray was taken.  We went a little distal to that needle, probably a few millimeters, where the disk actually was, made a cruciate incision, and then did a microdiskectomy.  We utilized the Epstein curettes and the nerve hooks, we went medially and laterally, went out in the lateral foramina, safely we could then remove the disk material as well.  I thoroughly irrigated out the area.  We then passed the hockey-stick out the foramina of the 4 root and the 5  root, they now were totally free. We irrigated the wound out again, loosely applied thrombin-soaked Gelfoam, and the wound was closed in layers usual fashion.  Note, I left a small distal deep and proximal part of the wound open for drainage purposes.  Subcu was closed with 0- Vicryl, skin with metal staples.  Sterile Neosporin dressing was applied.  Prior to surgery, she had 1 g of IV Ancef.  The patient left the operating room in satisfactory condition.  The estimated blood loss was about 50-75 cc.          ______________________________ Georges Lynch. Darrelyn Hillock, M.D.     RAG/MEDQ  D:  08/27/2011  T:  08/28/2011  Job:  [id number]  cc:   Vesta Mixer, M.D. Fax: [phone]  Electronically Signed by Ranee Gosselin M.D. on 08/31/2011 10:49:39 PM

## 2011-09-29 ENCOUNTER — Other Ambulatory Visit (INDEPENDENT_AMBULATORY_CARE_PROVIDER_SITE_OTHER): Payer: Medicare Other | Admitting: *Deleted

## 2011-09-29 ENCOUNTER — Other Ambulatory Visit: Payer: Self-pay | Admitting: Cardiovascular Disease

## 2011-09-29 DIAGNOSIS — E785 Hyperlipidemia, unspecified: Secondary | ICD-10-CM

## 2011-09-29 LAB — BASIC METABOLIC PANEL
BUN: 20 mg/dL (ref 6–23)
CO2: 26 mEq/L (ref 19–32)
Chloride: 105 mEq/L (ref 96–112)
Creatinine, Ser: 0.7 mg/dL (ref 0.4–1.2)

## 2011-09-29 LAB — LIPID PANEL
Total CHOL/HDL Ratio: 4
Triglycerides: 298 mg/dL — ABNORMAL HIGH (ref 0.0–149.0)
VLDL: 59.6 mg/dL — ABNORMAL HIGH (ref 0.0–40.0)

## 2011-09-29 LAB — HEPATIC FUNCTION PANEL
AST: 19 U/L (ref 0–37)
Total Bilirubin: 0.5 mg/dL (ref 0.3–1.2)

## 2011-09-30 LAB — LDL CHOLESTEROL, DIRECT: Direct LDL: 118.6 mg/dL

## 2012-04-02 ENCOUNTER — Emergency Department (HOSPITAL_COMMUNITY): Payer: Medicare Other

## 2012-04-02 ENCOUNTER — Encounter (HOSPITAL_COMMUNITY): Payer: Self-pay

## 2012-04-02 ENCOUNTER — Other Ambulatory Visit: Payer: Self-pay

## 2012-04-02 ENCOUNTER — Emergency Department (HOSPITAL_COMMUNITY)
Admission: EM | Admit: 2012-04-02 | Discharge: 2012-04-02 | Disposition: A | Discharge status: Home / Self Care | Payer: Medicare Other | Attending: Emergency Medicine | Admitting: Emergency Medicine

## 2012-04-02 DIAGNOSIS — R079 Chest pain, unspecified: Secondary | ICD-10-CM | POA: Insufficient documentation

## 2012-04-02 DIAGNOSIS — I10 Essential (primary) hypertension: Secondary | ICD-10-CM | POA: Insufficient documentation

## 2012-04-02 DIAGNOSIS — R42 Dizziness and giddiness: Secondary | ICD-10-CM | POA: Insufficient documentation

## 2012-04-02 DIAGNOSIS — E785 Hyperlipidemia, unspecified: Secondary | ICD-10-CM | POA: Insufficient documentation

## 2012-04-02 DIAGNOSIS — Z7982 Long term (current) use of aspirin: Secondary | ICD-10-CM | POA: Insufficient documentation

## 2012-04-02 DIAGNOSIS — R002 Palpitations: Secondary | ICD-10-CM | POA: Insufficient documentation

## 2012-04-02 DIAGNOSIS — R110 Nausea: Secondary | ICD-10-CM | POA: Insufficient documentation

## 2012-04-02 DIAGNOSIS — I739 Peripheral vascular disease, unspecified: Secondary | ICD-10-CM | POA: Insufficient documentation

## 2012-04-02 DIAGNOSIS — Z79899 Other long term (current) drug therapy: Secondary | ICD-10-CM | POA: Insufficient documentation

## 2012-04-02 DIAGNOSIS — R5381 Other malaise: Secondary | ICD-10-CM | POA: Insufficient documentation

## 2012-04-02 LAB — BASIC METABOLIC PANEL
CO2: 25 mEq/L (ref 19–32)
Calcium: 8.9 mg/dL (ref 8.4–10.5)
Chloride: 101 mEq/L (ref 96–112)
Glucose, Bld: 93 mg/dL (ref 70–99)
Potassium: 4.4 mEq/L (ref 3.5–5.1)
Sodium: 135 mEq/L (ref 135–145)

## 2012-04-02 LAB — CBC
Hemoglobin: 15.1 g/dL — ABNORMAL HIGH (ref 12.0–15.0)
MCH: 30.5 pg (ref 26.0–34.0)
RBC: 4.95 MIL/uL (ref 3.87–5.11)
WBC: 8.3 10*3/uL (ref 4.0–10.5)

## 2012-04-02 LAB — TROPONIN I: Troponin I: <0.3 ng/mL (ref <0.30)

## 2012-04-02 MED ORDER — ACETAMINOPHEN 325 MG PO TABS
650.0000 mg | ORAL_TABLET | Freq: Once | ORAL | Status: AC
  Administered 2012-04-02: 325 mg via ORAL
  Filled 2012-04-02 (×2): qty 1

## 2012-04-02 MED ORDER — ASPIRIN 81 MG PO CHEW: 324.0000 mg | CHEWABLE_TABLET | Freq: Once | ORAL | Status: DC

## 2012-04-02 NOTE — ED Notes (Signed)
Pt. Reports having a hx of A-fib and last night she began having a "chest strickening"   And she also stated, "MY heart has actually stopped beating".  She describes it as tight and non -radiating. She repots having dizziness and nausea.  Pt. ' skin is w/d, resp  Are even and unlabored.

## 2012-04-02 NOTE — ED Provider Notes (Signed)
4:24 PM Care assumed of patient in CDU. Pt awaiting 2nd troponin. Hx of afib, reports a sensation of palpitations and chest discomfort last evening. Negative cardiac cath in 2009. Case d/w [facility] cardiology who has arranged to see pt in clinic this week.  Pt was moved to CDU to await 2nd troponin, which is negative. Remains pain free. We will discharge her home to f/u in clinic this week.  Ronasia Isola, [place] 04/02/12 1705

## 2012-04-02 NOTE — ED Notes (Signed)
Patient has family at bedside. Patient is waiting on test results

## 2012-04-02 NOTE — ED Notes (Signed)
Patient denies pain and is resting comfortably.  

## 2012-04-02 NOTE — ED Provider Notes (Signed)
History     CSN: [id number]  Arrival date & time 04/02/12  1205   First MD Initiated Contact with Patient 04/02/12 1216      Chief Complaint  Patient presents with  . Chest Pain    (Consider location/radiation/quality/duration/timing/severity/associated sxs/prior treatment) HPI Cardiologist: Dr. Lourena Simmonds  Is to the emergency department with complaints of chest pain and palpitations. She has a long history of atrial fibrillation and can usually tell when she is having arrhythmia. She is normally not in A. fib and only has episodes once or twice a year. She states these past 2 weeks she has been coming in and out of A. fib a lot more. She states last night she had an episode where she was lying in bed and she felt as if  her heart stopped beating. She states that she had her husband put his hand on her chest and as soon as she did, her heart began to beat again. After waking up this morning and realizing that her symptoms continued to come and go she decided to come to the ED. She also complaints of feeling weak, dizzy and nausea. She has not had any chest pain since being in the ED.  Past Medical History  Diagnosis Date  . Arrhythmia     afib  . Hypertension   . Peripheral vascular disease   . Hyperlipidemia     Past Surgical History  Procedure Date  . Cardiac catheterization     2009    Family History  Problem Relation Age of Onset  . Hypertension Mother   . Heart disease Father   . Heart attack Father   . Hypertension Brother     History  Substance Use Topics  . Smoking status: Former Smoker    Quit date: 09/09/2010  . Smokeless tobacco: Not on file  . Alcohol Use: No    OB History    Grav Para Term Preterm Abortions TAB SAB Ect Mult Living                  Review of Systems   HEENT: denies blurry vision or change in hearing PULMONARY: Denies difficulty breathing and SOB MUSCULOSKELETAL:  denies being unable to ambulate ABDOMEN AL: denies abdominal  pain GU: denies loss of bowel or urinary control NEURO: denies numbness and tingling in extremities   Allergies  Simvastatin  Home Medications   Current Outpatient Rx  Name Route Sig Dispense Refill  . ASPIRIN 81 MG PO TABS Oral Take 81 mg by mouth daily.      . ATORVASTATIN CALCIUM 40 MG PO TABS Oral Take 1 tablet (40 mg total) by mouth daily. 30 tablet 11  . CARISOPRODOL 350 MG PO TABS Oral Take 350 mg by mouth 4 (four) times daily as needed. For pain    . METOPROLOL SUCCINATE ER 50 MG PO TB24 Oral Take 1 tablet (50 mg total) by mouth daily. 90 tablet 3  . OXYCODONE-ACETAMINOPHEN 10-650 MG PO TABS Oral Take 1 tablet by mouth every 6 (six) hours as needed. For pain    . TRAMADOL HCL 50 MG PO TABS Oral Take 50 mg by mouth every 6 (six) hours as needed. For pain      BP 160/76  Pulse 69  Temp(Src) 97.5 F (36.4 C) (Oral)  Resp 20  SpO2 99%  Physical Exam  Nursing note and vitals reviewed. Constitutional: She appears well-developed and well-nourished. No distress.  HENT:  Head: Normocephalic and atraumatic.  Eyes: Pupils  are equal, round, and reactive to light.  Neck: Normal range of motion. Neck supple.  Cardiovascular: Normal rate and regular rhythm.   Pulmonary/Chest: Effort normal.  Abdominal: Soft.  Neurological: She is alert.  Skin: Skin is warm and dry.    ED Course  Procedures (including critical care time)  Labs Reviewed  CBC - Abnormal; Notable for the following:    Hemoglobin 15.1 (*)    All other components within normal limits  TROPONIN I  BASIC METABOLIC PANEL   No results found.   No diagnosis found.    MDM   Date: 04/02/2012  Rate: 71  Rhythm: normal sinus rhythm  QRS Axis: normal  Intervals: normal  ST/T Wave abnormalities: normal  Conduction Disutrbances:none  Narrative Interpretation: possible left atrial enlargment  Old EKG Reviewed: unchanged from Sep 13, 2010   CARDIAC CATHETERIZATION  DATE OF PROCEDURE: August 13, 2008   Indications for procedure is continued chest pain, multiple cardiac risk  factors, and evaluate for obstructive coronary disease.  PROCEDURE DESCRIPTION: The patient was brought to the cardiac cath lab  after appropriate informed consent. She was prepped and draped in  sterile fashion. Approximately, 10 mL of 1% lidocaine was used for  local anesthesia. A 5-French sheath was placed in the right femoral  artery without difficulty. Coronary angiography, LV angiography was  then performed. The patient tolerated the procedure well and  transferred from the cardiac cath lab in stable condition.  FINDINGS:  1. Left Main: Normal.  2. LAD: Normal.  3. D1: Normal.  4. Large ramus intermedius: Normal.  5. LCX: Small, nondominant, and normal appearing.  6. RCA: Dominant and normal appearing.  7. LV: EF 65%. No wall motion abnormalities. LVDP is 17 mmHg.  IMPRESSION:  1. Normal-appearing coronary arteries.  2. Preserved left ventricular systolic function, ejection fraction  65%, left ventricular diastolic pressure is 17 mmHg.  PLAN: At this time, I would recommend aggressive risk factor  modification as indicated, and we will continue aspirin and statin  therapy.  Elmore Guise., M.D.  Electronically Signed   Cardiac work-up initiated. Pt is currently pain free.  The patients EKG and first troponin are negative. I have spoken with Dr. Tenny Craw with [facility] cardiology who will arrange patient to see Dr. Elease Hashimoto this week in clinic so long as delta Troponin is negative. Patient also had normal cath in 2009 as shown above.  I have discussed this patient with Dr. Lynelle Doctor agrees if Troponins are both negative and patient remains chest pain free she can follow-up in clinic this week.   PT being placed in CDU to await second Troponin. I discussed case with Betsey Holiday, PA-C.       Dorthula Matas, PA 04/02/12 1500

## 2012-04-02 NOTE — Discharge Instructions (Signed)
Aspirin and Your Heart Aspirin affects the way your blood clots and helps "thin" the blood. Aspirin has many uses in heart disease. It may be used as a primary prevention to help reduce the risk of heart related events. It also can be used as a secondary measure to prevent more heart attacks or to prevent additional damage from blood clots.  ASPIRIN MAY HELP IF YOU:  Have had a heart attack or chest pain.   Have undergone open heart surgery such as CABG (Coronary Artery Bypass Surgery).   Have had coronary angioplasty with or without stents.   Have experienced a stroke or TIA (transient ischemic attack).   Have peripheral vascular disease (PAD).   Have chronic heart rhythm problems such as atrial fibrillation.   Are at risk for heart disease.  BEFORE STARTING ASPIRIN Before you start taking aspirin, your caregiver will need to review your medical history. Many things will need to be taken into consideration, such as:  Smoking status.   Blood pressure.   Diabetes.   Gender.   Weight.   Cholesterol level.  ASPIRIN DOSES  Aspirin should only be taken on the advice of your caregiver. Talk to your caregiver about how much aspirin you should take. Aspirin comes in different doses such as:   81 mg.   162 mg.   325 mg.   The aspirin dose you take may be affected by many factors, some of which include:   Your current medications, especially if your are taking blood-thinners or anti-platelet medicine.   Liver function.   Heart disease risk.   Age.   Aspirin comes in two forms:   Non-enteric-coated. This type of aspirin does not have a coating and is absorbed faster. Non-enteric coated aspirin is recommended for patients experiencing chest pain symptoms. This type of aspirin also comes in a chewable form.   Enteric-coated. This means the aspirin has a special coating that releases the medicine very slowly. Enteric-coated aspirin causes less stomach upset. This type of  aspirin should not be chewed or crushed.  ASPIRIN SIDE EFFECTS Daily use of aspirin can increase your risk of serious side effects, some of these include:  Increased bleeding. This can range from a cut that does not stop bleeding to more serious problems such as stomach bleeding or bleeding into the brain (Intracerebral bleeding).   Increased bruising.   Stomach upset.   An allergic reaction such as red, itchy skin.   Increased risk of bleeding when combined with non-steroidal anti-inflammatory medicine (NSAIDS).   Alcohol should be drank in moderation when taking aspirin. Alcohol can increase the risk of stomach bleeding when taken with aspirin.   Aspirin should not be given to children less than 18 years of age due to the association of Reye syndrome. Reye syndrome is a serious illness that can affect the brain and liver. Studies have linked Reye syndrome with aspirin use in children.   People that have nasal polyps have an increased risk of developing an aspirin allergy.  SEEK MEDICAL CARE IF:   You develop an allergic reaction such as:   Hives.   Itchy skin.   Swelling of the lips, tongue or face.   You develop stomach pain.   You have unusual bleeding or bruising.   You have ringing in your ears.  SEEK IMMEDIATE MEDICAL CARE IF:   You have severe chest pain, especially if the pain is crushing or pressure-like and spreads to the arms, back, neck, or jaw. THIS   IS AN EMERGENCY. Do not wait to see if the pain will go away. Get medical help at once. Call your local emergency services (911 in the [place] DO NOT drive yourself to the hospital.   You have stroke-like symptoms such as:   Loss of vision.   Difficulty talking.   Numbness or weakness on one side of your body.   Numbness or weakness in your arm or leg.   Not thinking clearly or feeling confused.   Your bowel movements are bloody, dark red or black in color.   You vomit or cough up blood.   You have blood  in your urine.   You have shortness of breath, coughing or wheezing.  MAKE SURE YOU:   Understand these instructions.   Will monitor your condition.   Seek immediate medical care if necessary.  Document Released: 10/08/2008 Document Revised: 10/15/2011 Document Reviewed: 10/08/2008 [employer] Patient Information 2012 [employer].Chest Pain (Nonspecific) It is often hard to give a specific diagnosis for the cause of chest pain. There is always a chance that your pain could be related to something serious, such as a heart attack or a blood clot in the lungs. You need to follow up with your caregiver for further evaluation. CAUSES   Heartburn.   Pneumonia or bronchitis.   Anxiety or stress.   Inflammation around your heart (pericarditis) or lung (pleuritis or pleurisy).   A blood clot in the lung.   A collapsed lung (pneumothorax). It can develop suddenly on its own (spontaneous pneumothorax) or from injury (trauma) to the chest.   Shingles infection (herpes zoster virus).  The chest wall is composed of bones, muscles, and cartilage. Any of these can be the source of the pain.  The bones can be bruised by injury.   The muscles or cartilage can be strained by coughing or overwork.   The cartilage can be affected by inflammation and become sore (costochondritis).  DIAGNOSIS  Lab tests or other studies, such as X-rays, electrocardiography, stress testing, or cardiac imaging, may be needed to find the cause of your pain.  TREATMENT   Treatment depends on what may be causing your chest pain. Treatment may include:   Acid blockers for heartburn.   Anti-inflammatory medicine.   Pain medicine for inflammatory conditions.   Antibiotics if an infection is present.   You may be advised to change lifestyle habits. This includes stopping smoking and avoiding alcohol, caffeine, and chocolate.   You may be advised to keep your head raised (elevated) when sleeping. This reduces the  chance of acid going backward from your stomach into your esophagus.   Most of the time, nonspecific chest pain will improve within 2 to 3 days with rest and mild pain medicine.  HOME CARE INSTRUCTIONS   If antibiotics were prescribed, take your antibiotics as directed. Finish them even if you start to feel better.   For the next few days, avoid physical activities that bring on chest pain. Continue physical activities as directed.   Do not smoke.   Avoid drinking alcohol.   Only take over-the-counter or prescription medicine for pain, discomfort, or fever as directed by your caregiver.   Follow your caregiver's suggestions for further testing if your chest pain does not go away.   Keep any follow-up appointments you made. If you do not go to an appointment, you could develop lasting (chronic) problems with pain. If there is any problem keeping an appointment, you must call to reschedule.  SEEK   MEDICAL CARE IF:   You think you are having problems from the medicine you are taking. Read your medicine instructions carefully.   Your chest pain does not go away, even after treatment.   You develop a rash with blisters on your chest.  SEEK IMMEDIATE MEDICAL CARE IF:   You have increased chest pain or pain that spreads to your arm, neck, jaw, back, or abdomen.   You develop shortness of breath, an increasing cough, or you are coughing up blood.   You have severe back or abdominal pain, feel nauseous, or vomit.   You develop severe weakness, fainting, or chills.   You have a fever.  THIS IS AN EMERGENCY. Do not wait to see if the pain will go away. Get medical help at once. Call your local emergency services (911 in [place] Do not drive yourself to the hospital. MAKE SURE YOU:   Understand these instructions.   Will watch your condition.   Will get help right away if you are not doing well or get worse.  Document Released: 08/05/2005 Document Revised: 10/15/2011 Document Reviewed:  05/31/2008 [facility] Patient Information 2012 [place], [place].Cardiac Biomarkers Cardiac biomarkers are enzymes, proteins, and hormones that are associated with heart function, damage or failure. Some of the tests are specific for the heart while others are also elevated with skeletal muscle damage. Cardiac biomarkers are used for diagnostic and prognostic purposes and are frequently ordered by caregivers when someone comes into the Emergency Room complaining of symptoms, such as chest pain, pressure, nausea, and shortness of breath. These tests are ordered, along with other laboratory and non-laboratory tests, to detect heart failure (which is often a chronic, progressive condition affecting the ability of the heart to fill with blood and pump efficiently) and the acute coronary syndromes (ACS) as well as to help determine prognosis for people who have had a heart attack. ACS is a group of symptoms that reflect a sudden decrease in the amount of blood and oxygen, also termed 'ischemia,' reaching the heart. This decrease is frequently due to either a narrowing of the coronary arteries (atherosclerosis or vessel spasm) or unstable plaques, which can cause a blood clot (thrombus) and blockage of blood flow. If the oxygen supply is low, it can cause angina (pain); if blood flow is reduced, it can cause death of heart cells (called myocardial infarction or heart attack) and can lead to death of the affected heart muscle cells and to permanent damage and scarring of the heart.  The goal with cardiac biomarkers is to be able to detect the presence and severity of an acute heart condition as soon as possible so that appropriate treatment can be initiated.  There are only a few cardiac biomarkers that are being routinely used by physicians. Some have been phased out because they are not as specific as the marker of choice - troponin. Many other potential cardiac biomarkers are still being researched but their clinical  utility has yet to be established.  Note: Cardiac biomarkers are not the same tests as those that are used to screen the general healthy population for their risk of developing heart disease. Those can be found under Cardiac Risk Assessment. LABORATORY TESTS CURRENT CARDIAC BIOMARKERS   CK and CK-MB   BNP or (NT-proBNP)   Troponin   Myoglobin (not always used; sometimes ordered with troponin)  MORE GENERAL TESTS FREQUENTLY ORDERED ALONG WITH CARDIAC BIOMARKERS   Blood Gases   CMP   BMP   Electrolytes  CBC  ON THE HORIZON Ischemia modified albumin (IMA) - Test has received FDA approval for use with troponin and electrocardiogram to rule out acute coronary syndrome (ACS) in patients with chest pain. May become useful for identifying patients at higher risk of heart attack and potentially could replace myoglobin one day.  NON-LABORATORY TESTS These tests allow caregivers to look at the size, shape, and function of the heart as it is beating. They can be used to detect changes to the rhythm of the heart as well as to detect and evaluate damaged tissues and blocked arteries.   EKG (ECG, electrocardiogram)   Coronary angiography (or arteriography)   Stress testing   Nuclear scan   ECG (echocardiogram)   Chest X-ray  THE FOLLOWING SUMMARIZES CURRENTLY USED CARDIAC BIOMARKERS. Marker: CK  What: Enzyme that exists in three different isoforms   Where Found: Heart, brain, and skeletal muscle   What Indicates: Injury to muscle cells   Time to Increase: 4 to 6 hours after injury, peaks in 18 to 24 hours   Time back to Normal: Normal in 48 to 72 hours, unless due to continuing injury   When/How Used: Being phased out, may be ordered prior to CK-MB  Marker: CK-MB  What: Heart- related portion of total CK enzyme   Where Found: Heart primarily, but also in skeletal muscle   What Indicates: Injury (cell death) to heart   Time to Increase: 4 to 6 hrs after heart attack,  peaks in 12 to 20 hours   Time back to Normal: Returns to normal in 24 to 48 hours unless new/continual damage   When/How Used: Not as specific as Troponin for heart injury/attack, may be ordered when Troponin is not available, may be ordered to monitor new/continuing damage  Marker: Myoglobin  What: Small oxygen-storing protein   Where Found: Heart and other muscle cells   What Indicates: Injury to heart or other muscle cells. Also elevated with kidney problems.   Time to Increase: Starts to rise within 2 to 3 hours, peaks in 8 to 12 hours.   Time back to Normal: Falls back to normal by about one day after injury occurred   When/How Used: Ordered along with Troponin, helps diagnose heart injury/attack  Marker: Cardiac Troponin  What: Components of a Regulatory protein complex. Two cardiac specific isoforms: T and I   Where Found: Heart muscle   What Indicates: Heart injury/damage   Time to Increase: 4 to 8 hours   Time back to Normal: Remains elevated for 7 to 14 days   When/How Used: Ordered to help assess prognosis and diagnose heart attack  Marker: LDH  What: Enzyme   Where Found: Almost all body tissues   What Indicates: General marker of injury to cells   When/How Used: Phased out, not specific  Marker: AST  What: Enzyme   Where Found: Almost all body tissues   What Indicates: General marker of injury to cells   When/How Used: Phased out, not specific  Marker: Hs-CRP  What: Protein   Where Found: Associated with athero-sclerosis   What Indicates: Inflammatory process   Time back to Normal: Elevated with inflammation   When/How Used: May help determine prognosis of patients who have had heart attack  Marker: BNP  What: Hormone   Where Found: Heart's left ventricle   What Indicates: Heart failure   Time back to Normal: Elevation related to severity   When/How Used: Help diagnose and evaluate heart failure, prognosis, and to  monitor therapy    Document Released: 11/18/2004 Document Revised: 10/15/2011 Document Reviewed: 08/05/2005 [facility] Patient Information 2012 [place], [place].Electrocardiography An electrocardiogram is also known as an EKG or ECG. An EKG records the electrical impulses of the heart and is an important test to assess heart health. It provides insight regarding your heart rhythm, heart size, heart function and can can provide information as to whether you have had a heart attack. An EKG may be part of a routine physical exam and is done if you have experienced chest pain.  PROCEDURE   You will be asked to remove your clothes from the waist up, wear a hospital gown and lie down on your back for the test.   Electrodes or sticky patches are placed on your arms, legs and chest.   The electrodes are attached by wires to an EKG machine which records the electrical activity of your heart.   You will be asked to relax and lie still for a few seconds.   The EKG test is simple, safe and painless. It takes only a few minutes to perform the test. You cannot be shocked by the machine and no electricity goes through your body.  AFTER THE EKG  If the EKG was part of a routine physical exam, you may return to normal activities as told by your caregiver.   If the EKG was done to evaluate chest pain, you may need additional testing as determined by your caregiver for your safety.   Your doctor or a specially trained heart doctor (Cardiologist) will read the EKG results.   Not all test results are available during your visit. If your test results are not back during the visit, make an appointment with your caregiver to find out the results. Do not assume everything is normal if you have not heard from your caregiver or the medical facility. It is important for you to follow up on all of your test results.  Document Released: 10/23/2000 Document Revised: 10/15/2011 Document Reviewed: 08/15/2008 [facility] Patient Information 2012  [place], [place].Heart Attack in Women Heart attack (myocardial infarction) is one of the leading causes of sudden, unexpected death in women. Early recognition of heart attack symptoms is critical. Do not ignore heart attack symptoms. If you experience heart attack symptoms, get immediate help. Early treatment helps reduce heart damage. CAUSES  A heart attack happens because the heart (coronary) arteries become blocked by fatty deposits (plaque) or blood clots. This reduces the oxygen and blood supply to the heart. When one or more of the heart arteries becomes blocked, that area of the heart will begin to die, causing the pain felt during a heart attack.  RISK FACTORS In women, as the level of estrogen in the blood decreases after menopause, the risk of heart attack increases. Other risk factors of heart attack in women include:  High blood pressure.   High cholesterol levels.   Diabetes.   Smoking.   Obesity.   Menopause.   Hysterectomy.   Previous heart attack.   Lack of regular exercise.   Family history of heart attacks.  SYMPTOMS  In women, heart attack symptoms may be different than those in men. Women may not experience the typical chest discomfort or pain, which is considered the primary heart attack symptom in men. Women may describe a feeling of pressure, ache, or tightness in the chest. Women may experience new or different physical symptoms sometimes a month or more before a heart attack. Unusual, unexplained fatigue may be the  most frequently identified symptom. Sleep disturbances and weakness in the arms may also be considered warning signs.  Other heart attack symptoms that may occur more often in women are:  Unexplained feelings of nervousness or anxiety.   Discomfort between the shoulder blades.   Tingling in the hands and arms.   Swollen arms.   Headaches.  Heart attack symptoms for both men and women include:  Pain or discomfort spreading to the neck,  shoulder, arm, or jaw.   Shortness of breath.   Sudden cold sweats.   Pain or discomfort in the abdomen.   Heartburn or indigestion with or without vomiting.   Sudden lightheadedness.   Sudden fainting or blackout.  PREVENTION The following healthy lifestyle habits may help decrease your risk of heart attacks:  Quitting smoking.   Keeping your blood pressure, blood sugar, and cholesterol levels within normal limits.   Maintaining a healthy weight.   Staying physically active and exercising regularly.   Decreasing your salt intake.   Eating a diet low in saturated fats and cholesterol.   Increasing your fiber intake by including whole grains, vegetables, and fruits in your diet.   Avoiding situations that cause stress, anger, or depression.   Taking medicine as advised by your caregiver.  SEEK IMMEDIATE MEDICAL CARE IF:   You have pain or discomfort in the middle of your chest.   You have pain or discomfort in the upper part of your body, such as the arms, back, neck, jaw, or stomach.   You develop shortness of breath.   You break out into a cold sweat.   You feel nauseous or lightheaded.   You have a fainting episode.   You feel very unusual weakness.   You feel that your heart is pounding very hard or is beating irregularly.  MAKE SURE YOU:   Understand these instructions.   Will watch your condition.   Will get help right away if you are not doing well or get worse.  Document Released: 04/23/2008 Document Revised: 10/15/2011 Document Reviewed: 04/23/2008 [facility] Patient Information 2012 [place], [place].

## 2012-04-02 NOTE — ED Notes (Signed)
Vital signs stable. 

## 2012-04-02 NOTE — ED Notes (Addendum)
Awaiting cardiologist

## 2012-04-02 NOTE — ED Provider Notes (Signed)
Medical screening examination/treatment/procedure(s) were performed by non-physician practitioner and as supervising physician I was immediately available for consultation/collaboration.   Pt presents with chest tightness and palpitations.  Reviewed old records.  Pt does not have history of CAD per her cath in 2009.  CARDIAC CATHETERIZATION  DATE OF PROCEDURE: August 13, 2008  Indications for procedure is continued chest pain, multiple cardiac risk  factors, and evaluate for obstructive coronary disease.  PROCEDURE DESCRIPTION: The patient was brought to the cardiac cath lab  after appropriate informed consent. She was prepped and draped in  sterile fashion. Approximately, 10 mL of 1% lidocaine was used for  local anesthesia. A 5-French sheath was placed in the right femoral  artery without difficulty. Coronary angiography, LV angiography was  then performed. The patient tolerated the procedure well and  transferred from the cardiac cath lab in stable condition.  FINDINGS:  1. Left Main: Normal.  2. LAD: Normal.  3. D1: Normal.  4. Large ramus intermedius: Normal.  5. LCX: Small, nondominant, and normal appearing.  6. RCA: Dominant and normal appearing.  7. LV: EF 65%. No wall motion abnormalities. LVDP is 17 mmHg.  IMPRESSION:  1. Normal-appearing coronary arteries.  2. Preserved left ventricular systolic function, ejection fraction  65%, left ventricular diastolic pressure is 17 mmHg.  PLAN: At this time, I would recommend aggressive risk factor  modification as indicated, and we will continue aspirin and statin  therapy.  Elmore Guise., M.D.      Celene Kras, MD 04/02/12 [phone]

## 2012-04-08 ENCOUNTER — Ambulatory Visit (INDEPENDENT_AMBULATORY_CARE_PROVIDER_SITE_OTHER): Payer: Medicare Other | Admitting: Nurse Practitioner

## 2012-04-08 ENCOUNTER — Encounter: Payer: Self-pay | Admitting: Nurse Practitioner

## 2012-04-08 VITALS — BP 166/90 | HR 86 | Ht 66.0 in | Wt 177.2 lb

## 2012-04-08 DIAGNOSIS — R002 Palpitations: Secondary | ICD-10-CM

## 2012-04-08 DIAGNOSIS — I739 Peripheral vascular disease, unspecified: Secondary | ICD-10-CM

## 2012-04-08 DIAGNOSIS — I480 Paroxysmal atrial fibrillation: Secondary | ICD-10-CM

## 2012-04-08 DIAGNOSIS — R079 Chest pain, unspecified: Secondary | ICD-10-CM | POA: Insufficient documentation

## 2012-04-08 DIAGNOSIS — I4891 Unspecified atrial fibrillation: Secondary | ICD-10-CM

## 2012-04-08 NOTE — Progress Notes (Signed)
Kayla Hopkins Date of Birth: 1961/01/31 Medical Record #[id number]  History of Present Illness: [name] is seen today for a work in visit. She is seen for Dr. Elease Hashimoto. She is a former patient of Dr. Silva Bandy. She has no known CAD per cath back in 2009. Has history of PVD, PAF, HTN and HLD. Has continued to smoke.  She comes in today. She has been to the ER about a week ago with complaints of chest pain, shortness of breath, dizziness, staggering and difficulty sleeping. Does have palpitations. Still smoking. Her symptoms have worsened over the past couple of weeks. Only drinking one cup of coffee but still consumes chocolate. No alcohol reported. More pain in her legs with walking. She notes that it is the shortness of breath that she first notices, then the chest pressure and palpitations. This happens at least once a day and mostly at night. Her EKG in the ER showed sinus. She does not check her blood pressure at home.   Current Outpatient Prescriptions on File Prior to Visit  Medication Sig Dispense Refill  . atorvastatin (LIPITOR) 40 MG tablet Take 1 tablet (40 mg total) by mouth daily.  30 tablet  11  . metoprolol (TOPROL-XL) 50 MG 24 hr tablet Take 1 tablet (50 mg total) by mouth daily.  90 tablet  3  . traMADol (ULTRAM) 50 MG tablet Take 50 mg by mouth every 6 (six) hours as needed. For pain        Allergies  Allergen Reactions  . Simvastatin     Leg pains     Past Medical History  Diagnosis Date  . PAF (paroxysmal atrial fibrillation)   . Hypertension   . Peripheral vascular disease     with prior stenting per Dr. Myra Gianotti  . Hyperlipidemia   . Tobacco abuse   . Claudication   . S/P cardiac cath 2009    Normal    Past Surgical History  Procedure Date  . Cardiac catheterization     2009    History  Smoking status  . Smoker, Current Status Unknown  . Types: Cigarettes  . Last Attempt to Quit: 09/09/2010  Smokeless tobacco  . Not on file    History    Alcohol Use No    Family History  Problem Relation Age of Onset  . Hypertension Mother   . Heart disease Father   . Heart attack Father   . Hypertension Brother     Review of Systems: The review of systems is per the HPI.  All other systems were reviewed and are negative.  Physical Exam: BP 166/90  Pulse 86  Ht 5\' 6"  (1.676 m)  Wt 177 lb 3.2 oz (80.377 kg)  BMI 28.60 kg/m2  SpO2 98% Patient is very pleasant and in no acute distress. Skin is warm and dry. Color is normal.  HEENT is unremarkable. Normocephalic/atraumatic. PERRL. Sclera are nonicteric. Neck is supple. No masses. No JVD. Lungs are clear. Cardiac exam shows a regular rate and rhythm. Abdomen is soft. Extremities are without edema. She has decreased pulses in her feet bilaterally. Gait and ROM are intact. No gross neurologic deficits noted.   LABORATORY DATA: EKG today shows sinus rhythm.     Chemistry      Component Value Date/Time   NA 135 04/02/2012 1228   K 4.4 04/02/2012 1228   CL 101 04/02/2012 1228   CO2 25 04/02/2012 1228   BUN 18 04/02/2012 1228   CREATININE 0.69 04/02/2012  1228      Component Value Date/Time   CALCIUM 8.9 04/02/2012 1228   ALKPHOS 84 09/29/2011 0855   AST 19 09/29/2011 0855   ALT 23 09/29/2011 0855   BILITOT 0.5 09/29/2011 0855     Lab Results  Component Value Date   WBC 8.3 04/02/2012   HGB 15.1* 04/02/2012   HCT 44.7 04/02/2012   MCV 90.3 04/02/2012   PLT 240 04/02/2012    Lab Results  Component Value Date   CHOL 217* 09/29/2011   HDL 49.20 09/29/2011   LDLCALC  Value: 147        Total Cholesterol/HDL:CHD Risk Coronary Heart Disease Risk Table                     Men   Women  1/2 Average Risk   3.4   3.3* 08/11/2008   LDLDIRECT 118.6 09/29/2011   TRIG 298.0* 09/29/2011   CHOLHDL 4 09/29/2011   Dg Chest 2 View  04/02/2012  *RADIOLOGY REPORT*  Clinical Data: Chest pressure  CHEST - 2 VIEW  Comparison: 08/11/2011  Findings: Normal heart size.  Clear lungs.  No pneumatosis.  No  pleural effusion.  Intact thoracic spine.  IMPRESSION: No acute cardiopulmonary disease.  Original Report Authenticated By: Donavan Burnet, M.D.    Assessment / Plan:

## 2012-04-08 NOTE — Assessment & Plan Note (Signed)
Patient presents with shortness of breath, chest pain and palpitations. She had no CAD per cath in 2009 but has continued to smoke along with her other risk factors. I am going to update her stress test. We will place a 24 hour monitor. We will also arrange for lower extremity arterial doppler study. Smoking cessation is strongly encouraged. Have asked her to try and check some blood pressure readings as an outpatient. Will have Dr. Elease Hashimoto see her back for further discussion. If she is indeed having atrial fib and her stress test is ok, could consider adding Flecainide. But for now, I have left her on her current regimen. Patient is agreeable to this plan and will call if any problems develop in the interim.

## 2012-04-08 NOTE — Patient Instructions (Addendum)
We are going to place a monitor for 24 hours to check your rhythm  Try to monitor your blood pressure at home  We are going to arrange for a stress test  We are going to check arterial dopplers on your legs to look at the circulation to your legs  I will have you see Dr. Elease Hashimoto back for discussion once your studies are complete  Smoking cessation is strongly encouraged.   Call the [facility] office at [phone] if you have any questions, problems or concerns.

## 2012-04-12 NOTE — Progress Notes (Signed)
Addended by: Reine Just on: 04/12/2012 08:07 PM   Modules accepted: Orders

## 2012-04-15 ENCOUNTER — Telehealth: Payer: Self-pay | Admitting: *Deleted

## 2012-04-15 NOTE — Telephone Encounter (Signed)
Pvc's, nsr results given on monitor, pt verbalized understanding

## 2012-04-19 ENCOUNTER — Encounter (INDEPENDENT_AMBULATORY_CARE_PROVIDER_SITE_OTHER): Payer: Medicare Other

## 2012-04-19 DIAGNOSIS — I70219 Atherosclerosis of native arteries of extremities with intermittent claudication, unspecified extremity: Secondary | ICD-10-CM

## 2012-04-19 DIAGNOSIS — I739 Peripheral vascular disease, unspecified: Secondary | ICD-10-CM

## 2012-04-20 ENCOUNTER — Ambulatory Visit (HOSPITAL_COMMUNITY): Payer: Medicare Other | Attending: Cardiovascular Disease | Admitting: Radiology

## 2012-04-20 VITALS — BP 131/78 | Ht 66.0 in | Wt 178.0 lb

## 2012-04-20 DIAGNOSIS — R079 Chest pain, unspecified: Secondary | ICD-10-CM

## 2012-04-20 DIAGNOSIS — I480 Paroxysmal atrial fibrillation: Secondary | ICD-10-CM

## 2012-04-20 DIAGNOSIS — I4891 Unspecified atrial fibrillation: Secondary | ICD-10-CM | POA: Insufficient documentation

## 2012-04-20 DIAGNOSIS — R0602 Shortness of breath: Secondary | ICD-10-CM

## 2012-04-20 MED ORDER — TECHNETIUM TC 99M TETROFOSMIN IV KIT
10.0000 | PACK | Freq: Once | INTRAVENOUS | Status: AC | PRN
  Administered 2012-04-20: 10 via INTRAVENOUS

## 2012-04-20 MED ORDER — TECHNETIUM TC 99M TETROFOSMIN IV KIT
30.0000 | PACK | Freq: Once | INTRAVENOUS | Status: AC | PRN
  Administered 2012-04-20: 30 via INTRAVENOUS

## 2012-04-20 MED ORDER — REGADENOSON 0.4 MG/5ML IV SOLN
0.4000 mg | Freq: Once | INTRAVENOUS | Status: AC
  Administered 2012-04-20: 0.4 mg via INTRAVENOUS

## 2012-04-20 MED ORDER — AMINOPHYLLINE 25 MG/ML IV SOLN
75.0000 mg | Freq: Once | INTRAVENOUS | Status: AC
  Administered 2012-04-20: 75 mg via INTRAVENOUS

## 2012-04-20 NOTE — Progress Notes (Signed)
[facility] SITE 3 NUCLEAR MED [address] [place] [address] [phone]  Cardiology Nuclear Med Study  Kayla Hopkins is a 51 y.o. female     MRN : [id number]     DOB: December 26, 1960  Procedure Date: 04/20/2012  Nuclear Med Background Indication for Stress Test:  Evaluation for Ischemia, and Patient seen in hospital ED on 04/02/12 for CP, and SOB, Enzymes negative History:  '09 Heart Catheterization-NL, EF=65%, '09 Echo: EF=60-65% Cardiac Risk Factors: Claudication, Family History - CAD, Hypertension, Lipids, PVD and Smoker  Symptoms: Chest Pain and Chest Pressure without exertion (last occurrence 04/02/12),  Dizziness, DOE, Fatigue, Fatigue with Exertion, Light-Headedness, Nausea, Near Syncope, Palpitations and SOB   Nuclear Pre-Procedure Caffeine/Decaff Intake:  None NPO After: 9:00pm   Lungs:  clear O2 Sat: 97% on room air. IV 0.9% NS with Angio Cath:  22g  IV Site: R Antecubital  IV Started by:  Bonnita Levan, RN  Chest Size (in):  36 Cup Size: D  Height: 5\' 6"  (1.676 m)  Weight:  178 lb (80.74 kg)  BMI:  Body mass index is 28.73 kg/(m^2). Tech Comments:  Patient held all meds today. The patient complained of persistent bilateral leg and groin pain, fatigue and headache after lexiscan. Aminophylline 75 mg IVP given with resolution of symptoms.Irean Hong, RN    Nuclear Med Study 1 or 2 day study: 1 day  Stress Test Type:  Treadmill/Lexiscan  Reading MD: Charlton Haws, MD  Order Authorizing Provider:  Kristeen Miss, MD, and Norma Fredrickson, NP  Resting Radionuclide: Technetium 38m Tetrofosmin  Resting Radionuclide Dose: 10.9 mCi   Stress Radionuclide:  Technetium 56m Tetrofosmin  Stress Radionuclide Dose: 33.0 mCi           Stress Protocol Rest HR: 62 Stress HR: 126  Rest BP: 131/78 Stress BP: 192/70  Exercise Time (min): 1:45 METS: n/a   Predicted Max HR: 171 bpm % Max HR: 73.68 bpm Rate Pressure Product: 46962   Dose of Adenosine (mg):  n/a  Dose of Lexiscan: 0.4 mg  Dose of Atropine (mg): n/a Dose of Dobutamine: n/a mcg/kg/min (at max HR)  Stress Test Technologist: Irean Hong, RN  Nuclear Technologist:  Domenic Polite, CNMT     Rest Procedure:  Myocardial perfusion imaging was performed at rest 45 minutes following the intravenous administration of Technetium 87m Tetrofosmin. Rest ECG: NSR with minimal early replorization, nonspecific T wave changes  Stress Procedure:  The patient received IV Lexiscan 0.4 mg over 15-seconds with concurrent low level exercise. Technetium 67m Tetrofosmin was injected at 30-seconds while the patient continued walking until the patient complained of  SOB "can't breath" treadmill stopped. O2 sat 98%RA, lungs clear. There were nonspecific ST changes with Lexiscan. The patient complained of chest tightness. There was a rare PVC. Quantitative spect images were obtained after a 45-minute delay. Stress ECG: No significant change from baseline ECG  QPS Raw Data Images:  Normal; no motion artifact; normal heart/lung ratio. Stress Images:  Normal homogeneous uptake in all areas of the myocardium. Rest Images:  Normal homogeneous uptake in all areas of the myocardium. Subtraction (SDS):  Normal Transient Ischemic Dilatation (Normal <1.22):  1.09 Lung/Heart Ratio (Normal <0.45):  0.28  Quantitative Gated Spect Images QGS EDV:  60 ml QGS ESV:  15 ml  Impression Exercise Capacity:  Lexiscan with low level exercise. BP Response:  Normal blood pressure response. Clinical Symptoms:  There is dyspnea. ECG Impression:  No significant ST segment change suggestive of  ischemia. Comparison with Prior Nuclear Study: No previous nuclear study performed  Overall Impression:  Normal stress nuclear study.  LV Ejection Fraction: 75%.  LV Wall Motion:  NL LV Function; NL Wall Motion      Charlton Haws

## 2012-05-04 ENCOUNTER — Ambulatory Visit (INDEPENDENT_AMBULATORY_CARE_PROVIDER_SITE_OTHER): Payer: Medicare Other | Admitting: Cardiovascular Disease

## 2012-05-04 ENCOUNTER — Encounter: Payer: Self-pay | Admitting: Cardiovascular Disease

## 2012-05-04 ENCOUNTER — Encounter: Payer: Self-pay | Admitting: *Deleted

## 2012-05-04 VITALS — BP 128/80 | HR 80 | Ht 66.0 in | Wt 178.0 lb

## 2012-05-04 DIAGNOSIS — I10 Essential (primary) hypertension: Secondary | ICD-10-CM

## 2012-05-04 DIAGNOSIS — I739 Peripheral vascular disease, unspecified: Secondary | ICD-10-CM

## 2012-05-04 DIAGNOSIS — E785 Hyperlipidemia, unspecified: Secondary | ICD-10-CM

## 2012-05-04 NOTE — Progress Notes (Signed)
HPI  Kayla Hopkins is here today for evaluation and management of peripheral arterial disease.  She has no known CAD per cath back in 2009. She does have known history of peripheral arterial disease, paroxysmal atrial fibrillation, hypertension and hyperlipidemia. She continues to smoke.  She had chest discomfort recently and was evaluated by a stress test which was normal. In 2010, she had significant bilateral hip claudication. She underwent angiography by Dr. Myra Gianotti which showed significant bilateral ostial common iliac disease. She underwent bilateral kissing stent placement with an 8 x 24 mm Genesis stent. 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 She has not seen Dr. Myra Gianotti in more than 2 years.  The patient did fine for about 2 years without any discomfort. However, over the last year she had progressive bilateral lower extremity claudication starting from the buttock area all the way down to her thigh. This has been happening with minimal walking and less than 50 feet. It's significantly interfering with her activities of daily living. Unfortunately, she continues to smoke. A  Allergies  Allergen Reactions  . Simvastatin     Leg pains      Current Outpatient Prescriptions on File Prior to Visit  Medication Sig Dispense Refill  . aspirin 325 MG tablet Take 325 mg by mouth daily.      [name] atorvastatin (LIPITOR) 40 MG tablet Take 1 tablet (40 mg total) by mouth daily.  30 tablet  11  . meloxicam (MOBIC) 15 MG tablet Take 7.5 mg by mouth daily.      . metoprolol (TOPROL-XL) 50 MG 24 hr tablet Take 1 tablet (50 mg total) by mouth daily.  90 tablet  3     Past Medical History  Diagnosis Date  . PAF (paroxysmal atrial fibrillation)   . Hypertension   . Peripheral vascular disease 06/11/2009    Bilateral common iliac kissing stents (8X24 mm) done by  Dr. Myra Gianotti  . Hyperlipidemia   . Tobacco abuse   . Claudication   . S/P cardiac  cath 2009    Normal     Past Surgical History  Procedure Date  . Cardiac catheterization     2009     Family History  Problem Relation Age of Onset  . Hypertension Mother   . Heart disease Father   . Heart attack Father   . Hypertension Brother      History   Social History  . Marital Status: Single    Spouse Name: N/A    Number of Children: N/A  . Years of Education: N/A   Occupational History  . Not on file.   Social History Main Topics  . Smoking status: Smoker, Current Status Unknown    Types: Cigarettes    Last Attempt to Quit: 09/09/2010  . Smokeless tobacco: Not on file  . Alcohol Use: No  . Drug Use: No  . Sexually Active: Yes   Other Topics Concern  . Not on file   Social History Narrative  . No narrative on file     ROS Constitutional: Negative for fever, chills, diaphoresis, activity change, appetite change and fatigue.  HENT: Negative for hearing loss, nosebleeds, congestion, sore throat, facial swelling, drooling, trouble swallowing, neck pain, voice change, sinus pressure and tinnitus.  Eyes: Negative for photophobia, pain, discharge and visual disturbance.  Respiratory: Negative for apnea, cough, chest tightness, shortness of breath and wheezing.  Cardiovascular: Negative for chest pain, palpitations and leg swelling.  Gastrointestinal: Negative for nausea, vomiting, abdominal pain, diarrhea, constipation, blood in stool and abdominal distention.  Genitourinary: Negative for dysuria, urgency, frequency, hematuria and decreased urine volume.  Neurological: Negative for dizziness, tremors, seizures, syncope, speech difficulty, weakness, light-headedness, numbness and headaches.  Psychiatric/Behavioral: Negative for suicidal ideas, hallucinations, behavioral problems and agitation. The patient is not nervous/anxious.     PHYSICAL EXAM   BP 128/80  Pulse 80  Ht 5\' 6"  (1.676 m)  Wt 178 lb (80.74 kg)  BMI 28.73 kg/m2  Constitutional: She  is oriented to person, place, and time. She appears well-developed and well-nourished. No distress.  HENT: No nasal discharge.  Head: Normocephalic and atraumatic.  Eyes: Pupils are equal and round. Right eye exhibits no discharge. Left eye exhibits no discharge.  Neck: Normal range of motion. Neck supple. No JVD present. No thyromegaly present.  Cardiovascular: Normal rate, regular rhythm, normal heart sounds. Exam reveals no gallop and no friction rub. No murmur heard.  Pulmonary/Chest: Effort normal and breath sounds normal. No stridor. No respiratory distress. She has no wheezes. She has no rales. She exhibits no tenderness.  Abdominal: Soft. Bowel sounds are normal. She exhibits no distension. There is no tenderness. There is no rebound and no guarding.  Musculoskeletal: Normal range of motion. She exhibits no edema and no tenderness.  Neurological: She is alert and oriented to person, place, and time. Coordination normal.  Skin: Skin is warm and dry. No rash noted. She is not diaphoretic. No erythema. No pallor.  Psychiatric: She has a normal mood and affect. Her behavior is normal. Judgment and thought content normal.  Vascular: Femoral pulses are barely palpable bilaterally. Distal pulses are very faint bilaterally.    ASSESSMENT AND PLAN

## 2012-05-04 NOTE — Assessment & Plan Note (Signed)
Her blood pressure is well controlled. Continue current medications. 

## 2012-05-04 NOTE — Assessment & Plan Note (Signed)
Lab Results  Component Value Date   CHOL 217* 09/29/2011   HDL 49.20 09/29/2011   LDLCALC  Value: 147        Total Cholesterol/HDL:CHD Risk Coronary Heart Disease Risk Table                     Men   Women  1/2 Average Risk   3.4   3.3* 08/11/2008   LDLDIRECT 118.6 09/29/2011   TRIG 298.0* 09/29/2011   CHOLHDL 4 09/29/2011   Continued treatment with Lipitor. Target LDL is less than 70. She might need increasing the dose.

## 2012-05-04 NOTE — Patient Instructions (Addendum)
Your physician recommends that you continue on your current medications as directed. Please refer to the Current Medication list given to you today.   You are scheduled for an abdominal aortagram with lower extremity runoff on May 11, 2012.  Please see instruction letter. You will need to be at [facility] by 8am on July 3,2013

## 2012-05-04 NOTE — Assessment & Plan Note (Signed)
The patient has significant lifestyle limiting claudication bilaterally. Her femoral pulses are barely palpable bilaterally. ABI showed evidence of inflow disease. She likely has significant restenosis bilaterally. Due to severity of her symptoms, I recommend proceeding with an abdominal aortogram and lower extremity runoff and possible angioplasty. Will likely need ultrasound-guided access to the right femoral artery. She tolerated Plavix in the past which was stopped 6 months after due to stomach discomfort. She has no previous history of bleeding complications. I strongly advised her to quit smoking. Aggressive treatment of risk factors is recommended.

## 2012-05-05 ENCOUNTER — Encounter (HOSPITAL_COMMUNITY): Payer: Self-pay | Admitting: Pharmacy Technician

## 2012-05-05 LAB — CBC WITH DIFFERENTIAL/PLATELET
Basophils Relative: 0.9 % (ref 0.0–3.0)
Eosinophils Relative: 1.7 % (ref 0.0–5.0)
HCT: 44.3 % (ref 36.0–46.0)
Hemoglobin: 14.8 g/dL (ref 12.0–15.0)
Lymphs Abs: 2.9 10*3/uL (ref 0.7–4.0)
MCV: 91.8 fl (ref 78.0–100.0)
Monocytes Absolute: 0.5 10*3/uL (ref 0.1–1.0)
Monocytes Relative: 5.2 % (ref 3.0–12.0)
RBC: 4.83 Mil/uL (ref 3.87–5.11)
WBC: 9.3 10*3/uL (ref 4.5–10.5)

## 2012-05-05 LAB — BASIC METABOLIC PANEL
BUN: 18 mg/dL (ref 6–23)
CO2: 28 mEq/L (ref 19–32)
Glucose, Bld: 93 mg/dL (ref 70–99)
Potassium: 4.1 mEq/L (ref 3.5–5.1)
Sodium: 140 mEq/L (ref 135–145)

## 2012-05-06 ENCOUNTER — Encounter: Payer: Self-pay | Admitting: Cardiovascular Disease

## 2012-05-06 ENCOUNTER — Ambulatory Visit (INDEPENDENT_AMBULATORY_CARE_PROVIDER_SITE_OTHER): Payer: Medicare Other | Admitting: Cardiovascular Disease

## 2012-05-06 VITALS — BP 148/84 | HR 67 | Ht 66.0 in | Wt 177.8 lb

## 2012-05-06 DIAGNOSIS — I739 Peripheral vascular disease, unspecified: Secondary | ICD-10-CM

## 2012-05-06 DIAGNOSIS — I10 Essential (primary) hypertension: Secondary | ICD-10-CM

## 2012-05-06 NOTE — Progress Notes (Signed)
[name] Date of Birth  1961-04-12       [facility]    [place] [address]  [address], [address] [place], [place]  [address]   [place], [place]  [address] [phone]     [phone]   Fax  [phone]    Fax [phone]  Problem List: 1. Peripheral vascular disease- s/p stenting of both iliac arteries 2. Paroxysmal atrial fibrillation 3. Hyperlipidemia  History of Present Illness: Manal is a 51 year old female with a history of palpitations and headaches. She also has a history of hypercholesterolemia. She's had paroxysmal atrial fibrillation and was started on Pradaxa. She developed some gastritis with the Pradaxa and was ultimately discontinued. She overall feels fairly well. She still has some palpitations when she eats lots of chocolate.   She was seen by Lawson Fiscal. She's been having some chest pain. A stress Myoview study was negative for ischemia. She had normal left ventricular systolic function. She had an event monitor that revealed normal sinus rhythm and premature atrial contractions. She was also found to have some peripheral vascular disease and is scheduled to have an angiogram with Dr. Kirke Corin.  She has not had a cigarette in 14 days.  She is breathing better since she has "stopped "  She has had lots of palpitations especially at night.  Current Outpatient Prescriptions on File Prior to Visit  Medication Sig Dispense Refill  . acetaminophen (TYLENOL) 500 MG tablet Take 1,000 mg by mouth every 6 (six) hours as needed. For pain      . aspirin 325 MG tablet Take 325 mg by mouth daily.      [name] atorvastatin (LIPITOR) 40 MG tablet Take 40 mg by mouth daily.      . meloxicam (MOBIC) 15 MG tablet Take 7.5 mg by mouth daily.      . metoprolol succinate (TOPROL-XL) 50 MG 24 hr tablet Take 50 mg by mouth daily. Take with or immediately following a meal.        Allergies  Allergen Reactions  . Simvastatin     Leg pains      Past Medical History  Diagnosis Date  . PAF (paroxysmal atrial fibrillation)   . Hypertension   . Peripheral vascular disease 06/11/2009    Bilateral common iliac kissing stents (8X24 mm) done by  Dr. Myra Gianotti  . Hyperlipidemia   . Tobacco abuse   . Claudication   . S/P cardiac cath 2009    Normal    Past Surgical History  Procedure Date  . Cardiac catheterization     2009    History  Smoking status  . Smoker, Current Status Unknown  . Types: Cigarettes  . Last Attempt to Quit: 09/09/2010  Smokeless tobacco  . Not on file    History  Alcohol Use No    Family History  Problem Relation Age of Onset  . Hypertension Mother   . Heart disease Father   . Heart attack Father   . Hypertension Brother     Reviw of Systems:  Reviewed in the HPI.  All other systems are negative.  Physical Exam: Blood pressure 148/84, pulse 67, height 5\' 6"  (1.676 m), weight 177 lb 12.8 oz (80.65 kg). General: Well developed, well nourished, in no acute distress.  Head: Normocephalic, atraumatic, sclera non-icteric, mucus membranes are moist,   Neck: Supple. Carotids are 2 + without bruits. No JVD  Lungs: Clear bilaterally to auscultation.  Heart: regular rate.  normal  S1 S2. No murmurs, gallops or rubs.  Abdomen: Soft, non-tender, non-distended with normal bowel sounds. No hepatomegaly. No rebound/guarding. No masses.  Msk:  Strength and tone are normal  Extremities: No clubbing or cyanosis. No edema.  Distal pedal pulses are 2+ and equal bilaterally.  Neuro: Alert and oriented X 3. Moves all extremities spontaneously.  Psych:  Responds to questions appropriately with a normal affect.  ECG:  Assessment / Plan:

## 2012-05-06 NOTE — Patient Instructions (Addendum)
Your physician wants you to follow-up in: 6 months with Dr Kirke Corin,  You will receive a reminder letter in the mail two months in advance. If you don't receive a letter, please call our office to schedule the follow-up appointment.  Your physician recommends that you return for a FASTING lipid profile: 6 month

## 2012-05-06 NOTE — Assessment & Plan Note (Signed)
Kayla Hopkins has stopped smoking now for 14 days. She's scheduled have an angiogram with Dr. Kirke Corin next week.I will her again in 6 months for office visit, EKG, and lipid profile.  I discussed the fact with her that she may want to followup with Dr. Kirke Corin as her primary cardiologist.  We will set her up to see him in 6 months.

## 2012-05-06 NOTE — Assessment & Plan Note (Addendum)
Her blood pressure is mildly elevated today. She stopped smoking 14 days ago. I've asked her to exercise regularly and to eat a low-salt diet. She's not able to exercise because of claudication. She is scheduled have an angiogram next week with Dr. Kirke Corin.  We'll have her followup with Dr. Kirke Corin following his visit. I'm happy to see her for hypertension and hypercholesteremia if needed.

## 2012-05-11 ENCOUNTER — Encounter (HOSPITAL_COMMUNITY)
Admission: RE | Disposition: A | Discharge status: Home / Self Care | Payer: Self-pay | Source: Ambulatory Visit | Attending: Cardiovascular Disease

## 2012-05-11 ENCOUNTER — Observation Stay (HOSPITAL_COMMUNITY)
Admission: RE | Admit: 2012-05-11 | Discharge: 2012-05-12 | Disposition: A | Discharge status: Home / Self Care | Payer: Medicare Other | Source: Ambulatory Visit | Attending: Cardiovascular Disease | Admitting: Cardiovascular Disease

## 2012-05-11 DIAGNOSIS — F17200 Nicotine dependence, unspecified, uncomplicated: Secondary | ICD-10-CM | POA: Insufficient documentation

## 2012-05-11 DIAGNOSIS — E785 Hyperlipidemia, unspecified: Secondary | ICD-10-CM | POA: Insufficient documentation

## 2012-05-11 DIAGNOSIS — T82898A Other specified complication of vascular prosthetic devices, implants and grafts, initial encounter: Principal | ICD-10-CM | POA: Insufficient documentation

## 2012-05-11 DIAGNOSIS — Y831 Surgical operation with implant of artificial internal device as the cause of abnormal reaction of the patient, or of later complication, without mention of misadventure at the time of the procedure: Secondary | ICD-10-CM | POA: Insufficient documentation

## 2012-05-11 DIAGNOSIS — I70219 Atherosclerosis of native arteries of extremities with intermittent claudication, unspecified extremity: Secondary | ICD-10-CM | POA: Insufficient documentation

## 2012-05-11 DIAGNOSIS — I739 Peripheral vascular disease, unspecified: Secondary | ICD-10-CM

## 2012-05-11 DIAGNOSIS — I10 Essential (primary) hypertension: Secondary | ICD-10-CM | POA: Insufficient documentation

## 2012-05-11 HISTORY — PX: ABDOMINAL ANGIOGRAM: SHX5499

## 2012-05-11 LAB — POCT ACTIVATED CLOTTING TIME: Activated Clotting Time: 219 seconds

## 2012-05-11 SURGERY — ABDOMINAL ANGIOGRAM
Anesthesia: LOCAL

## 2012-05-11 MED ORDER — SODIUM CHLORIDE 0.9 % IV SOLN
INTRAVENOUS | Status: DC
  Administered 2012-05-11: 1000 mL via INTRAVENOUS

## 2012-05-11 MED ORDER — SODIUM CHLORIDE 0.9 % IJ SOLN: 3.0000 mL | Freq: Two times a day (BID) | INTRAMUSCULAR | Status: DC

## 2012-05-11 MED ORDER — LABETALOL HCL 5 MG/ML IV SOLN
20.0000 mg | Freq: Once | INTRAVENOUS | Status: AC
  Administered 2012-05-11: 20 mg via INTRAVENOUS

## 2012-05-11 MED ORDER — LABETALOL HCL 5 MG/ML IV SOLN
INTRAVENOUS | Status: AC
  Filled 2012-05-11: qty 4

## 2012-05-11 MED ORDER — MORPHINE SULFATE 4 MG/ML IJ SOLN
INTRAMUSCULAR | Status: AC
  Filled 2012-05-11: qty 1

## 2012-05-11 MED ORDER — ATORVASTATIN CALCIUM 40 MG PO TABS
40.0000 mg | ORAL_TABLET | Freq: Every day | ORAL | Status: DC
  Administered 2012-05-11: 40 mg via ORAL
  Filled 2012-05-11 (×2): qty 1

## 2012-05-11 MED ORDER — CLOPIDOGREL BISULFATE 75 MG PO TABS
75.0000 mg | ORAL_TABLET | Freq: Every day | ORAL | Status: DC
  Administered 2012-05-12: 75 mg via ORAL
  Filled 2012-05-11 (×2): qty 1

## 2012-05-11 MED ORDER — ACETAMINOPHEN 325 MG PO TABS: 650.0000 mg | ORAL_TABLET | ORAL | Status: DC | PRN

## 2012-05-11 MED ORDER — ALPRAZOLAM 0.25 MG PO TABS: 0.2500 mg | ORAL_TABLET | Freq: Two times a day (BID) | ORAL | Status: DC | PRN

## 2012-05-11 MED ORDER — SODIUM CHLORIDE 0.9 % IV SOLN: INTRAVENOUS | Status: DC

## 2012-05-11 MED ORDER — METOPROLOL SUCCINATE ER 50 MG PO TB24
50.0000 mg | ORAL_TABLET | Freq: Every evening | ORAL | Status: DC
  Administered 2012-05-11: 50 mg via ORAL
  Filled 2012-05-11 (×2): qty 1

## 2012-05-11 MED ORDER — MELOXICAM 7.5 MG PO TABS
7.5000 mg | ORAL_TABLET | Freq: Every day | ORAL | Status: DC
  Administered 2012-05-11 – 2012-05-12 (×2): 7.5 mg via ORAL
  Filled 2012-05-11 (×2): qty 1

## 2012-05-11 MED ORDER — ZOLPIDEM TARTRATE 5 MG PO TABS: 5.0000 mg | ORAL_TABLET | Freq: Every evening | ORAL | Status: DC | PRN

## 2012-05-11 MED ORDER — ASPIRIN 325 MG PO TABS
325.0000 mg | ORAL_TABLET | Freq: Every day | ORAL | Status: DC
  Administered 2012-05-11 – 2012-05-12 (×2): 325 mg via ORAL
  Filled 2012-05-11 (×2): qty 1

## 2012-05-11 MED ORDER — CLOPIDOGREL BISULFATE 75 MG PO TABS: 75.0000 mg | ORAL_TABLET | Freq: Every day | ORAL | Status: DC

## 2012-05-11 MED ORDER — MORPHINE SULFATE 4 MG/ML IJ SOLN
2.0000 mg | INTRAMUSCULAR | Status: DC | PRN
  Administered 2012-05-11 – 2012-05-12 (×4): 2 mg via INTRAVENOUS
  Filled 2012-05-11 (×2): qty 1

## 2012-05-11 MED ORDER — SODIUM CHLORIDE 0.9 % IV SOLN: 250.0000 mL | INTRAVENOUS | Status: DC | PRN

## 2012-05-11 MED ORDER — SODIUM CHLORIDE 0.9 % IJ SOLN: 3.0000 mL | INTRAMUSCULAR | Status: DC | PRN

## 2012-05-11 MED ORDER — NITROGLYCERIN 0.4 MG SL SUBL: 0.4000 mg | SUBLINGUAL_TABLET | SUBLINGUAL | Status: DC | PRN

## 2012-05-11 MED ORDER — ONDANSETRON HCL 4 MG/2ML IJ SOLN: 4.0000 mg | Freq: Four times a day (QID) | INTRAMUSCULAR | Status: DC | PRN

## 2012-05-11 NOTE — Progress Notes (Signed)
   S: Pt is s/p PV angio and PTA w/o stenting for in-stent restenosis w/in prev placed RCIA stent.  Post-procedure pt developed a hematoma, which was stabilized.  She has been recovering in short stay and when she tried to get up to ambulate, she had severe r groin pain and was noted by nurse to develop recurrent hematoma.  She was placed on bedrest, hematoma was manually compressed, and pain has been managed with prn morphine.  Site is now soft and ecchymotic but very tender.  She also has acute on chronic back pain.  Please see clinic note dated 6/26 for complete details/H&P.  O:   Filed Vitals:   05/11/12 1715  BP: 124/97  Pulse: 66  Resp:    Pleasant, NAD, aaox3.  Nl affect.  Neck supple w/o jvd.  Lungs cta, cor RRR.  Abd: soft, nt, nd, bs+x4, ext - r groin ecchymotic and tender.  No hematoma, bleeding, bruit.  DP/PT + bilat.  A/P:  1.  R groin hematoma:  Recurrent hematoma post PTA with site tenderness and pain.  Site is now soft but ecchymotic.  No bruit.  Good distal pulses.  She is having significant pain.  We will observe tonight.  Keep on bedrest for 2 more hrs.  PRN pain meds. Hopefully can d/c in early AM.  2.  PVD:  S/p RCIA stenting.  Good distal pulses.  3.  PAF:  Cont bb.  Not on anticoagulation @ home.  4.  HTN: stable.  Cont bb.  5.  HL:  Cont statin.  Nicolasa Ducking, NP

## 2012-05-11 NOTE — H&P (View-Only) (Signed)
  HPI  Kayla Hopkins is here today for evaluation and management of peripheral arterial disease.  She has no known CAD per cath back in 2009. She does have known history of peripheral arterial disease, paroxysmal atrial fibrillation, hypertension and hyperlipidemia. She continues to smoke.  She had chest discomfort recently and was evaluated by a stress test which was normal. In 2010, she had significant bilateral hip claudication. She underwent angiography by Dr. Brabham which showed significant bilateral ostial common iliac disease. She underwent bilateral kissing stent placement with an 8 x 24 mm Genesis stent. 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 She has not seen Dr. Brabham in more than 2 years.  The patient did fine for about 2 years without any discomfort. However, over the last year she had progressive bilateral lower extremity claudication starting from the buttock area all the way down to her thigh. This has been happening with minimal walking and less than 50 feet. It's significantly interfering with her activities of daily living. Unfortunately, she continues to smoke. A  Allergies  Allergen Reactions  . Simvastatin     Leg pains      Current Outpatient Prescriptions on File Prior to Visit  Medication Sig Dispense Refill  . aspirin 325 MG tablet Take 325 mg by mouth daily.      . atorvastatin (LIPITOR) 40 MG tablet Take 1 tablet (40 mg total) by mouth daily.  30 tablet  11  . meloxicam (MOBIC) 15 MG tablet Take 7.5 mg by mouth daily.      . metoprolol (TOPROL-XL) 50 MG 24 hr tablet Take 1 tablet (50 mg total) by mouth daily.  90 tablet  3     Past Medical History  Diagnosis Date  . PAF (paroxysmal atrial fibrillation)   . Hypertension   . Peripheral vascular disease 06/11/2009    Bilateral common iliac kissing stents (8X24 mm) done by  Dr. Brabham  . Hyperlipidemia   . Tobacco abuse   . Claudication   . S/P cardiac  cath 2009    Normal     Past Surgical History  Procedure Date  . Cardiac catheterization     2009     Family History  Problem Relation Age of Onset  . Hypertension Mother   . Heart disease Father   . Heart attack Father   . Hypertension Brother      History   Social History  . Marital Status: Single    Spouse Name: N/A    Number of Children: N/A  . Years of Education: N/A   Occupational History  . Not on file.   Social History Main Topics  . Smoking status: Smoker, Current Status Unknown    Types: Cigarettes    Last Attempt to Quit: 09/09/2010  . Smokeless tobacco: Not on file  . Alcohol Use: No  . Drug Use: No  . Sexually Active: Yes   Other Topics Concern  . Not on file   Social History Narrative  . No narrative on file     ROS Constitutional: Negative for fever, chills, diaphoresis, activity change, appetite change and fatigue.  HENT: Negative for hearing loss, nosebleeds, congestion, sore throat, facial swelling, drooling, trouble swallowing, neck pain, voice change, sinus pressure and tinnitus.  Eyes: Negative for photophobia, pain, discharge and visual disturbance.  Respiratory: Negative for apnea, cough, chest tightness, shortness of breath and wheezing.  Cardiovascular: Negative for chest pain, palpitations and leg swelling.    Gastrointestinal: Negative for nausea, vomiting, abdominal pain, diarrhea, constipation, blood in stool and abdominal distention.  Genitourinary: Negative for dysuria, urgency, frequency, hematuria and decreased urine volume.  Neurological: Negative for dizziness, tremors, seizures, syncope, speech difficulty, weakness, light-headedness, numbness and headaches.  Psychiatric/Behavioral: Negative for suicidal ideas, hallucinations, behavioral problems and agitation. The patient is not nervous/anxious.     PHYSICAL EXAM   BP 128/80  Pulse 80  Ht 5' 6" (1.676 m)  Wt 178 lb (80.74 kg)  BMI 28.73 kg/m2  Constitutional: She  is oriented to person, place, and time. She appears well-developed and well-nourished. No distress.  HENT: No nasal discharge.  Head: Normocephalic and atraumatic.  Eyes: Pupils are equal and round. Right eye exhibits no discharge. Left eye exhibits no discharge.  Neck: Normal range of motion. Neck supple. No JVD present. No thyromegaly present.  Cardiovascular: Normal rate, regular rhythm, normal heart sounds. Exam reveals no gallop and no friction rub. No murmur heard.  Pulmonary/Chest: Effort normal and breath sounds normal. No stridor. No respiratory distress. She has no wheezes. She has no rales. She exhibits no tenderness.  Abdominal: Soft. Bowel sounds are normal. She exhibits no distension. There is no tenderness. There is no rebound and no guarding.  Musculoskeletal: Normal range of motion. She exhibits no edema and no tenderness.  Neurological: She is alert and oriented to person, place, and time. Coordination normal.  Skin: Skin is warm and dry. No rash noted. She is not diaphoretic. No erythema. No pallor.  Psychiatric: She has a normal mood and affect. Her behavior is normal. Judgment and thought content normal.  Vascular: Femoral pulses are barely palpable bilaterally. Distal pulses are very faint bilaterally.    ASSESSMENT AND PLAN   

## 2012-05-11 NOTE — Progress Notes (Signed)
C BERGE,NP IN TO SEE CLIENT; REPORT CALLED TO 2005 AND TRANSFERRED VIA BED

## 2012-05-11 NOTE — CV Procedure (Signed)
     PERIPHERAL VASCULAR PROCEDURE  NAME:  Kayla Hopkins   MRN: [id number] DOB:  Nov 08, 1961   ADMIT DATE: 05/11/2012  Performing Cardiologist: [name] Primary Physician: Devra Dopp, MD  Procedures Performed:  Abdominal Aortic Angiogram with bilateral iliac angiography   Balloon angioplasty of the right common iliac artery for severe instent restenosis.   Indication(s): Claudication   Consent: The procedure with Risks/Benefits/Alternatives and Indications was reviewed with the patient .  All questions were answered.  Medications:  Sedation:  2 mg IV Versed, 100 mcg IV Fentanyl  Contrast:  112 ML Omnipaque  Procedural details: The right groin was prepped, draped, and anesthetized with 1% lidocaine. There was no pulse felt on the right side. A SmartNeedle was used to access the artery with some difficulty. A 5 French sheath was placed in the right femoral artery. A 5 Fr Short Pigtail Catheter was advanced of over a Versicore wire into the descending Aorta to a level just above the renal arteries. There was some difficulty in resistance in advancing the wire through the previously placed stent. A power injection of 34ml/sec contrast over 1 sec was performed for Abdominal Aortic Angiography.  The catheter was then pulled back to a level just above the Aortic bifurcation, and a second power injection was performed to evaluate bi-ileiofemoral arteries.    Interventional Procedure:  Angiography showed severe 90% in-stent restenosis in the right common iliac artery. The left common iliac artery stent was patent with mild restenosis. The sheath appears to be possibly occlusive versus low flow state. The patient was given unfractionated heparin with therapeutic ACT. The stenosis was rewired with a glide wire to ensure that it was not going through the contralateral stent. A 7 X 20 mm balloon was advanced to the distal aorta. The Glidewire was exchanged to the woolly wire. The instent  restenosis was then ballooned to 12 atmosphere for 2 minutes. The balloon was then removed and the pigtail catheter was advanced again for angiography. Angiography showed very good results with good flow into the right external and common femoral arteries   Hemodynamics:  Central Aortic Pressure / Mean Aortic Pressure:  164/73  Findings:  Abdominal aorta:  normal in size with mild diffuse atherosclerosis in the distal segment.  Left renal artery:  normal  Right renal artery:  normal in size with 20% proximal disease.  Celiac artery:  patent.  Superior mesenteric artery:  patent.  Right common iliac artery:  90% in-stent restenosis. 2 kissing stents are noted covering both ostial common iliac arteries with about 5 mm overlap.  Right internal iliac artery:  mild atherosclerosis.  Right external iliac artery:  mild atherosclerosis.  Right common femoral artery:  mild atherosclerosis.  Left common iliac artery:   patent stent with minimal restenosis. Distal to the stent, a small ulceration is noted likely the site of previously described distal edge dissection. There is a 40 % calcified lesion distally.  Left internal iliac artery:  normal  Left external iliac artery:  normal  Left common femoral artery:  minimal irregularities.  Conclusions: 1. 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 2.  Successful angioplasty for in-stent restenosis without stent placement.  Recommendations:   Plavix for one month. Smoking cessation is strongly advised.   [name], MD, [facility] 05/11/2012 12:16 PM

## 2012-05-11 NOTE — Progress Notes (Signed)
WENT IN TO GET CLIENT UP AND SHE C/O RIGHT GROIN PAIN AND 5X5CM HEMATOMA NOTED AND PRESSURE HELD FOR AND CHRIS BERGE ,NP NOTIFIED AND WILL BE IN TO SEE

## 2012-05-11 NOTE — Interval H&P Note (Signed)
History and Physical Interval Note:  05/11/2012 10:54 AM  Kayla Hopkins  has presented today for surgery, with the diagnosis of r/o pvd  The various methods of treatment have been discussed with the patient and family. After consideration of risks, benefits and other options for treatment, the patient has consented to  Procedure(s) (LRB): ABDOMINAL ANGIOGRAM (N/A) as a surgical intervention .  The patient's history has been reviewed, patient examined, no change in status, stable for surgery.  I have reviewed the patients' chart and labs.  Questions were answered to the patient's satisfaction.     [name]

## 2012-05-12 ENCOUNTER — Encounter (HOSPITAL_COMMUNITY): Payer: Self-pay | Admitting: *Deleted

## 2012-05-12 DIAGNOSIS — I739 Peripheral vascular disease, unspecified: Secondary | ICD-10-CM

## 2012-05-12 LAB — BASIC METABOLIC PANEL
GFR calc Af Amer: >90 mL/min (ref >90)
GFR calc non Af Amer: >90 mL/min (ref >90)
Potassium: 4.1 mEq/L (ref 3.5–5.1)
Sodium: 141 mEq/L (ref 135–145)

## 2012-05-12 LAB — CBC
Hemoglobin: 13.4 g/dL (ref 12.0–15.0)
MCHC: 33.6 g/dL (ref 30.0–36.0)
RDW: 13.3 % (ref 11.5–15.5)
WBC: 7.5 10*3/uL (ref 4.0–10.5)

## 2012-05-12 MED ORDER — MELOXICAM 15 MG PO TABS: 7.5000 mg | ORAL_TABLET | Freq: Every day | ORAL | Status: DC

## 2012-05-12 NOTE — Discharge Summary (Signed)
CARDIOLOGY DISCHARGE SUMMARY   Patient ID: ARIAUNA FARABEE MRN: [id number] DOB/AGE: 1961-02-07 51 y.o.  Admit date: 05/11/2012 Discharge date: 05/12/2012  Primary Discharge Diagnosis:  PV disease, s/p  Secondary Discharge Diagnosis:  Past Medical History  Diagnosis Date  . PAF (paroxysmal atrial fibrillation)   . Hypertension   . Peripheral vascular disease 06/11/2009    Bilateral common iliac kissing stents (8X24 mm) done by  Dr. Myra Gianotti  . Hyperlipidemia   . Tobacco abuse   . Claudication   . S/P cardiac cath 2009    Normal   Procedures: Abdominal Aortic Angiogram with bilateral iliac angiography, Balloon angioplasty of the right common iliac artery for severe instent restenosis.   Hospital Course: Ms. Favorite is a 51 year old female with a history of peripheral vascular disease. She was seen by Dr. Kirke Corin on June 26. She was having claudication symptoms and was scheduled for peripheral vascular cath. She came to the hospital for the procedure on 05/11/2012. The results are listed below.  On 05/12/2012, she was seen by Dr. Clifton James. She was ambulating without chest pain or shortness of breath and considered stable for discharge, to follow up as an outpatient.  Labs:   Lab Results  Component Value Date   WBC 7.5 05/12/2012   HGB 13.4 05/12/2012   HCT 39.9 05/12/2012   MCV 90.9 05/12/2012   PLT 202 05/12/2012    Lab 05/12/12 0543  NA 141  K 4.1  CL 107  CO2 25  BUN 13  CREATININE 0.67  CALCIUM 8.9  PROT --  BILITOT --  ALKPHOS --  ALT --  AST --  GLUCOSE 94    Peripheral Cath:  Findings:  Abdominal aorta: normal in size with mild diffuse atherosclerosis in the distal segment.  Left renal artery: normal  Right renal artery: normal in size with 20% proximal disease.  Celiac artery: patent.  Superior mesenteric artery: patent.  Right common iliac artery: 90% in-stent restenosis. 2 kissing stents are noted covering both ostial common iliac arteries with about 5 mm  overlap.  Right internal iliac artery: mild atherosclerosis.  Right external iliac artery: mild atherosclerosis.  Right common femoral artery: mild atherosclerosis.  Left common iliac artery: patent stent with minimal restenosis. Distal to the stent, a small ulceration is noted likely the site of previously described distal edge dissection. There is a 40 % calcified lesion distally.  Left internal iliac artery: normal  Left external iliac artery: normal  Left common femoral artery: minimal irregularities. Conclusions:  1. 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  2. Successful angioplasty for in-stent restenosis without stent placement.   FOLLOW UP PLANS AND APPOINTMENTS Allergies  Allergen Reactions  . Pradaxa (Dabigatran Etexilate Mesylate)     Gastritis   . Simvastatin     Leg pains     Medication List  As of 05/12/2012  9:31 AM   TAKE these medications         acetaminophen 500 MG tablet   Commonly known as: TYLENOL   Take 1,000 mg by mouth every 6 (six) hours as needed. For pain      aspirin 325 MG tablet   Take 325 mg by mouth daily.      atorvastatin 40 MG tablet   Commonly known as: LIPITOR   Take 40 mg by mouth daily.      clopidogrel 75 MG tablet   Commonly known as: PLAVIX   Take 1 tablet (75 mg total) by mouth  daily.      meloxicam 15 MG tablet   Commonly known as: MOBIC   Take 7.5 mg by mouth daily.      metoprolol succinate 50 MG 24 hr tablet   Commonly known as: TOPROL-XL   Take 50 mg by mouth daily. Take with or immediately following a meal.            Discharge Orders    Future Appointments: Provider: Department: Dept Phone: Center:   05/26/2012 11:30 AM [facility]  None   06/01/2012 2:30 PM Iran Ouch, MD [facility] [phone] [id number]     Follow-up Information    Follow up with [name], MD in 3 weeks. (ABI in 2 weeks)    Contact information:   [address]  [address] [place] [address] [phone]         BRING ALL MEDICATIONS WITH YOU TO FOLLOW UP APPOINTMENTS  Time spent with patient to include physician time: 33 min Signed: Theodore Demark 05/12/2012, 9:31 AM Co-Sign MD

## 2012-05-12 NOTE — Progress Notes (Signed)
    SUBJECTIVE: No complaints. No CP,SOB. No bleeding in right groin overnight. Right groin is sore.   BP 143/82  Pulse 64  Temp 97.7 F (36.5 C) (Oral)  Resp 20  Ht 5\' 6"  (1.676 m)  Wt 179 lb (81.194 kg)  BMI 28.89 kg/m2  SpO2 97% No intake or output data in the 24 hours ending 05/12/12 0824  PHYSICAL EXAM General: Well developed, well nourished, in no acute distress. Alert and oriented x 3.  Psych:  Good affect, responds appropriately Neck: No JVD. No masses noted.  Lungs: Clear bilaterally with no wheezes or rhonci noted.  Heart: RRR with no murmurs noted. Abdomen: Bowel sounds are present. Soft, non-tender.  Extremities: No lower extremity edema. Bruising right groin. Soft. No hematoma.   LABS: Basic Metabolic Panel:  Basename 05/12/12 0543  NA 141  K 4.1  CL 107  CO2 25  GLUCOSE 94  BUN 13  CREATININE 0.67  CALCIUM 8.9  MG --  PHOS --   CBC:  Basename 05/12/12 0543  WBC 7.5  NEUTROABS --  HGB 13.4  HCT 39.9  MCV 90.9  PLT 202   Current Meds:    . aspirin  325 mg Oral Daily  . atorvastatin  40 mg Oral Q2000  . clopidogrel  75 mg Oral Q breakfast  . labetalol  20 mg Intravenous Once  . meloxicam  7.5 mg Oral Daily  . metoprolol succinate  50 mg Oral QPM  . morphine      . DISCONTD: sodium chloride  3 mL Intravenous Q12H     ASSESSMENT AND PLAN:  1. PAD: Pt admitted after intervention on the right common iliac artery yesterday per Dr. Kirke Corin. He performed balloon angioplasty of the severe in-stent restenosis in the right common iliac artery stent. She had a hematoma post procedure. She did well overnight. No bleeding. H/H stable. Continue ASA and Plavix.   2. Dispo: D/C home this am. F/U Dr. Kirke Corin in 2-3 weeks.   Kayla Hopkins  7/4/20138:24 AM

## 2012-05-12 NOTE — Progress Notes (Signed)
UR Completed.  Kayla Hopkins [phone] 05/12/2012

## 2012-05-13 NOTE — Discharge Summary (Signed)
See full note.cdm 

## 2012-05-19 ENCOUNTER — Other Ambulatory Visit: Payer: Self-pay | Admitting: Cardiology

## 2012-05-19 DIAGNOSIS — I739 Peripheral vascular disease, unspecified: Secondary | ICD-10-CM

## 2012-06-01 ENCOUNTER — Encounter: Payer: Medicare Other | Admitting: Cardiovascular Disease

## 2012-06-02 ENCOUNTER — Encounter (INDEPENDENT_AMBULATORY_CARE_PROVIDER_SITE_OTHER): Payer: Medicare Other

## 2012-06-02 DIAGNOSIS — I739 Peripheral vascular disease, unspecified: Secondary | ICD-10-CM

## 2012-06-08 ENCOUNTER — Ambulatory Visit (INDEPENDENT_AMBULATORY_CARE_PROVIDER_SITE_OTHER): Payer: Medicare Other | Admitting: Cardiovascular Disease

## 2012-06-08 ENCOUNTER — Encounter: Payer: Self-pay | Admitting: Cardiovascular Disease

## 2012-06-08 VITALS — BP 136/84 | HR 72 | Ht 66.0 in | Wt 180.0 lb

## 2012-06-08 DIAGNOSIS — E785 Hyperlipidemia, unspecified: Secondary | ICD-10-CM

## 2012-06-08 DIAGNOSIS — I739 Peripheral vascular disease, unspecified: Secondary | ICD-10-CM

## 2012-06-08 NOTE — Patient Instructions (Addendum)
Your physician wants you to follow-up in: 6 months.  You will receive a reminder letter in the mail two months in advance. If you don't receive a letter, please call our office to schedule the follow-up appointment.  Your physician has requested that you have an abdominal aorta duplex. During this test, an ultrasound is used to evaluate the aorta. Allow 30 minutes for this exam. Do not eat after midnight the day before and avoid carbonated beverages. To be done in 3 months.  We will call you with the results of lab work done today

## 2012-06-09 LAB — HEPATIC FUNCTION PANEL
ALT: 22 U/L (ref 0–35)
AST: 17 U/L (ref 0–37)
Alkaline Phosphatase: 73 U/L (ref 39–117)
Bilirubin, Direct: 0 mg/dL (ref 0.0–0.3)
Total Protein: 6.7 g/dL (ref 6.0–8.3)

## 2012-06-20 ENCOUNTER — Encounter: Payer: Self-pay | Admitting: Cardiovascular Disease

## 2012-06-20 NOTE — Assessment & Plan Note (Signed)
I will check fasting lipid and liver profile. Continue treatment with atorvastatin.

## 2012-06-20 NOTE — Progress Notes (Signed)
HPI  Kayla Hopkins is a 51 year old female who is here today for followup visit.  She has no known CAD per cath back in 2009. She does have known history of peripheral arterial disease, paroxysmal atrial fibrillation, hypertension and hyperlipidemia. She was seen recently for significant right lower extremity claudication with significant drop in ABI. In 2010, she had significant bilateral hip claudication. She underwent angiography by Dr. Myra Gianotti which showed significant bilateral ostial common iliac disease. She underwent bilateral kissing stent placement with an 8 x 24 mm Genesis stent. She underwent angiography which confirmed severe 90% in-stent restenosis in the right common iliac artery. She had successful balloon angioplasty without significant residual stenosis. Her claudication has completely resolved. Postprocedure ABI was normal on the right side.  Allergies  Allergen Reactions  . Pradaxa (Dabigatran Etexilate Mesylate)     Gastritis   . Simvastatin     Leg pains      Current Outpatient Prescriptions on File Prior to Visit  Medication Sig Dispense Refill  . acetaminophen (TYLENOL) 500 MG tablet Take 1,000 mg by mouth every 6 (six) hours as needed. For pain      . aspirin 325 MG tablet Take 325 mg by mouth daily.      [name] atorvastatin (LIPITOR) 40 MG tablet Take 40 mg by mouth daily.      . clopidogrel (PLAVIX) 75 MG tablet Take 1 tablet (75 mg total) by mouth daily.  30 tablet  0  . meloxicam (MOBIC) 15 MG tablet Take 0.5 tablets (7.5 mg total) by mouth daily. Try not to use while on Plavix      . metoprolol succinate (TOPROL-XL) 50 MG 24 hr tablet Take 50 mg by mouth daily. Take with or immediately following a meal.         Past Medical History  Diagnosis Date  . PAF (paroxysmal atrial fibrillation)   . Hypertension   . Peripheral vascular disease 06/11/2009    Bilateral common iliac kissing stents (8X24 mm) done by  Dr. Myra Gianotti. Repeat angiography in July of 2013  showed severe in-stent restenosis in the right common iliac artery stent. She underwent successful balloon angioplasty.   . Hyperlipidemia   . Tobacco abuse   . Claudication   . S/P cardiac cath 2009    Normal     Past Surgical History  Procedure Date  . Cardiac catheterization     2009     Family History  Problem Relation Age of Onset  . Hypertension Mother   . Heart disease Father   . Heart attack Father   . Hypertension Brother      History   Social History  . Marital Status: Single    Spouse Name: N/A    Number of Children: N/A  . Years of Education: N/A   Occupational History  . Not on file.   Social History Main Topics  . Smoking status: Current Everyday Smoker    Types: Cigarettes  . Smokeless tobacco: Not on file   Comment: electronic cigarette  . Alcohol Use: No  . Drug Use: No  . Sexually Active: Yes   Other Topics Concern  . Not on file   Social History Narrative  . No narrative on file       PHYSICAL EXAM   BP 136/84  Pulse 72  Ht 5\' 6"  (1.676 m)  Wt 180 lb (81.647 kg)  BMI 29.05 kg/m2  SpO2 93% Constitutional: She is oriented to person, place, and time.  She appears well-developed and well-nourished. No distress.  HENT: No nasal discharge.  Head: Normocephalic and atraumatic.  Eyes: Pupils are equal and round. Right eye exhibits no discharge. Left eye exhibits no discharge.  Neck: Normal range of motion. Neck supple. No JVD present. No thyromegaly present.  Cardiovascular: Normal rate, regular rhythm, normal heart sounds. Exam reveals no gallop and no friction rub. No murmur heard.  Pulmonary/Chest: Effort normal and breath sounds normal. No stridor. No respiratory distress. She has no wheezes. She has no rales. She exhibits no tenderness.  Abdominal: Soft. Bowel sounds are normal. She exhibits no distension. There is no tenderness. There is no rebound and no guarding.  Musculoskeletal: Normal range of motion. She exhibits no edema  and no tenderness.  Neurological: She is alert and oriented to person, place, and time. Coordination normal.  Skin: Skin is warm and dry. No rash noted. She is not diaphoretic. No erythema. No pallor.  Psychiatric: She has a normal mood and affect. Her behavior is normal. Judgment and thought content normal.  Vascular: Femoral pulses are slightly diminished bilaterally. No hematoma or bruit.   ASSESSMENT AND PLAN

## 2012-06-20 NOTE — Assessment & Plan Note (Signed)
Recent angiography showed significant in-stent restenosis to the right common iliac artery stent. She underwent successful balloon angioplasty with resolution of symptoms since then and normalization of ABI. I discussed with her the importance of aggressive lifestyle changes most importantly smoking cessation. I recommend a followup aortoiliac duplex and ABI in 3 months. Followup with me in 6 months.

## 2012-07-15 ENCOUNTER — Other Ambulatory Visit: Payer: Self-pay | Admitting: *Deleted

## 2012-07-15 MED ORDER — METOPROLOL SUCCINATE ER 50 MG PO TB24: 50.0000 mg | ORAL_TABLET | Freq: Every day | ORAL | Status: DC

## 2012-07-15 MED ORDER — ATORVASTATIN CALCIUM 40 MG PO TABS: 40.0000 mg | ORAL_TABLET | Freq: Every day | ORAL | Status: DC

## 2012-07-15 NOTE — Telephone Encounter (Signed)
Fax Received. Refill Completed. Kayla Hopkins (R.M.A)   

## 2012-07-15 NOTE — Telephone Encounter (Signed)
Fax Received. Refill Completed. Yudit Modesitt Chowoe (R.M.A)   

## 2012-09-02 ENCOUNTER — Other Ambulatory Visit: Payer: Self-pay | Admitting: Cardiology

## 2012-09-02 DIAGNOSIS — I700 Atherosclerosis of aorta: Secondary | ICD-10-CM

## 2012-09-02 DIAGNOSIS — I739 Peripheral vascular disease, unspecified: Secondary | ICD-10-CM

## 2012-09-28 ENCOUNTER — Emergency Department (INDEPENDENT_AMBULATORY_CARE_PROVIDER_SITE_OTHER)
Admission: EM | Admit: 2012-09-28 | Discharge: 2012-09-28 | Disposition: A | Discharge status: Home / Self Care | Payer: Medicare Other | Source: Home / Self Care | Attending: Emergency Medicine | Admitting: Emergency Medicine

## 2012-09-28 ENCOUNTER — Encounter (HOSPITAL_COMMUNITY): Payer: Self-pay | Admitting: *Deleted

## 2012-09-28 ENCOUNTER — Encounter (INDEPENDENT_AMBULATORY_CARE_PROVIDER_SITE_OTHER): Payer: Medicare Other

## 2012-09-28 DIAGNOSIS — I700 Atherosclerosis of aorta: Secondary | ICD-10-CM

## 2012-09-28 DIAGNOSIS — I739 Peripheral vascular disease, unspecified: Secondary | ICD-10-CM

## 2012-09-28 DIAGNOSIS — M549 Dorsalgia, unspecified: Secondary | ICD-10-CM

## 2012-09-28 DIAGNOSIS — M5430 Sciatica, unspecified side: Secondary | ICD-10-CM

## 2012-09-28 MED ORDER — CYCLOBENZAPRINE HCL 10 MG PO TABS: 10.0000 mg | ORAL_TABLET | Freq: Three times a day (TID) | ORAL | Status: DC | PRN

## 2012-09-28 MED ORDER — HYDROCODONE-IBUPROFEN 7.5-200 MG PO TABS: 1.0000 | ORAL_TABLET | Freq: Three times a day (TID) | ORAL | Status: DC | PRN

## 2012-09-28 NOTE — ED Notes (Signed)
Pt reports sciatica on the left side with cramping/ radiation down leg - hx of same - back surgery last year

## 2012-09-28 NOTE — ED Provider Notes (Signed)
History     CSN: [id number]  Arrival date & time 09/28/12  1020   First MD Initiated Contact with Patient 09/28/12 1155      Chief Complaint  Patient presents with  . Sciatica    (Consider location/radiation/quality/duration/timing/severity/associated sxs/prior treatment) HPI Comments: Patient presents urgent care this afternoon complaining of ongoing left-sided back pain that radiates all  The way down to her left foot and  lateral buttocks area to the lateral aspect of her left lower extremity all way down to her foot. She describes tingling and numbness sensation all way down to her foot. She feels that last week she was doing some yard work when she suddenly moved " in the wrong way", and she suddenly started experiencing this pain. She recalls that the last time she had sciatic type pain was prior to her back surgery almost 2 years ago. She has made attempts to call her orthopedic provider but the informed her that he could not see her for the next couple of weeks ( she describes). Patient denies any urinary symptoms such as incontinence, changes in bowel movements, denies any constitutional symptoms such as fevers, unintentional weight loss, muscular weakness.  The history is provided by the patient.    Past Medical History  Diagnosis Date  . PAF (paroxysmal atrial fibrillation)   . Hypertension   . Peripheral vascular disease 06/11/2009    Bilateral common iliac kissing stents (8X24 mm) done by  Dr. Myra Gianotti. 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 She underwent successful balloon angioplasty.   . Hyperlipidemia   . Tobacco abuse   . Claudication   . S/P cardiac cath 2009    Normal    Past Surgical History  Procedure Date  . Cardiac catheterization     2009  . Back surgery     Family History  Problem Relation Age of Onset  . Hypertension Mother   . Heart disease Father   . Heart attack Father   .  Hypertension Brother     History  Substance Use Topics  . Smoking status: Current Every Day Smoker    Types: Cigarettes  . Smokeless tobacco: Not on file     Comment: electronic cigarette  . Alcohol Use: No    OB History    Grav Para Term Preterm Abortions TAB SAB Ect Mult Living                  Review of Systems  Constitutional: Positive for activity change. Negative for fever, chills and fatigue.  Genitourinary: Negative for dysuria, urgency, frequency, flank pain and pelvic pain.  Musculoskeletal: Positive for back pain and gait problem. Negative for joint swelling.  Skin: Negative for rash and wound.  Neurological: Negative for dizziness.    Allergies  Pradaxa and Simvastatin  Home Medications   Current Outpatient Rx  Name  Route  Sig  Dispense  Refill  . ASPIRIN 325 MG PO TABS   Oral   Take 325 mg by mouth daily.         . ATORVASTATIN CALCIUM 40 MG PO TABS   Oral   Take 1 tablet (40 mg total) by mouth daily.   30 tablet   5   . METOPROLOL SUCCINATE ER 50 MG PO TB24   Oral   Take 1 tablet (50 mg total) by mouth daily. Take with or immediately following a meal.   90 tablet   3   .  ACETAMINOPHEN 500 MG PO TABS   Oral   Take 1,000 mg by mouth every 6 (six) hours as needed. For pain         . CLOPIDOGREL BISULFATE 75 MG PO TABS   Oral   Take 1 tablet (75 mg total) by mouth daily.   30 tablet   0   . CYCLOBENZAPRINE HCL 10 MG PO TABS   Oral   Take 1 tablet (10 mg total) by mouth 3 (three) times daily as needed for muscle spasms.   20 tablet   0   . HYDROCODONE-IBUPROFEN 7.5-200 MG PO TABS   Oral   Take 1 tablet by mouth every 8 (eight) hours as needed for pain.   10 tablet   0     BP 172/83  Pulse 79  Temp 96.8 F (36 C) (Oral)  Resp 20  SpO2 98%  Physical Exam  Nursing note and vitals reviewed. Constitutional: She appears well-developed. No distress.  Eyes: Conjunctivae normal are normal.  Neck: Neck supple.  Pulmonary/Chest:  Effort normal and breath sounds normal.  Abdominal: Soft.  Musculoskeletal: She exhibits tenderness.       Lumbar back: She exhibits decreased range of motion, tenderness, pain and spasm. She exhibits no bony tenderness, no swelling, no edema, no deformity, no laceration and normal pulse.       Back:       Legs: Neurological: She is alert. No cranial nerve deficit. Coordination normal.  Skin: No rash noted.    ED Course  Procedures (including critical care time)  Labs Reviewed - No data to display No results found.   1. Back pain with radiation   2. Sciatic neuralgia       MDM   Sciatic left sided. Patient exhibited limited lumbar range of motion conserved muscular strength no signs of neural vascular deficits. Symptoms and exam, not consistent with a cauda equina syndrome. Patient had been prescribed Vicoprofen for 72 hours along with muscle relaxer. Has been informed that she needs to call her orthopedic doctor if pain was to persist beyond 3 days. She agrees with treatment plan, and follow-up care with her orthopedist.       Jimmie Molly, MD 09/28/12 1326

## 2012-10-04 NOTE — Progress Notes (Signed)
Pt was notified and appt was scheduled tomorrow at 1:45pm.

## 2012-10-05 ENCOUNTER — Ambulatory Visit (INDEPENDENT_AMBULATORY_CARE_PROVIDER_SITE_OTHER): Payer: Medicare Other | Admitting: Cardiovascular Disease

## 2012-10-05 ENCOUNTER — Encounter: Payer: Self-pay | Admitting: Cardiovascular Disease

## 2012-10-05 VITALS — BP 128/84 | HR 85 | Resp 12 | Ht 66.0 in | Wt 177.4 lb

## 2012-10-05 DIAGNOSIS — I739 Peripheral vascular disease, unspecified: Secondary | ICD-10-CM

## 2012-10-05 DIAGNOSIS — Z01812 Encounter for preprocedural laboratory examination: Secondary | ICD-10-CM

## 2012-10-05 DIAGNOSIS — E785 Hyperlipidemia, unspecified: Secondary | ICD-10-CM

## 2012-10-05 DIAGNOSIS — I10 Essential (primary) hypertension: Secondary | ICD-10-CM

## 2012-10-05 MED ORDER — ASPIRIN 81 MG PO TABS: 325.0000 mg | ORAL_TABLET | Freq: Every day | ORAL | Status: DC

## 2012-10-05 MED ORDER — HYDROCODONE-ACETAMINOPHEN 7.5-325 MG PO TABS: 1.0000 | ORAL_TABLET | Freq: Three times a day (TID) | ORAL | Status: DC | PRN

## 2012-10-05 NOTE — Progress Notes (Signed)
HPI  Kayla Hopkins is a 51-year-old female who is here today for followup visit.  She has no known CAD per cath back in 2009. She does have known history of peripheral arterial disease, paroxysmal atrial fibrillation, hypertension and hyperlipidemia. She was seen in July for significant right lower extremity claudication with significant drop in ABI. In 2010, she had significant bilateral hip claudication. She underwent angiography by Dr. Brabham which showed significant bilateral ostial common iliac disease. She underwent bilateral kissing stent placement with an 8 x 24 mm Genesis stent.  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 She had successful balloon angioplasty without significant residual stenosis. Her claudication resolved completely after the proecdure. Since then, there has been gradual increase in claudication on both sides especially when goes upstairs. Symptoms are worse on the right side than the left side. The patient is known to have chronic low back pain as well. She woke up 2 days ago with a stiff neck with pain.  Allergies  Allergen Reactions  . Pradaxa (Dabigatran Etexilate Mesylate)     Gastritis   . Simvastatin     Leg pains      Current Outpatient Prescriptions on File Prior to Visit  Medication Sig Dispense Refill  . atorvastatin (LIPITOR) 40 MG tablet Take 1 tablet (40 mg total) by mouth daily.  30 tablet  5  . metoprolol succinate (TOPROL-XL) 50 MG 24 hr tablet Take 1 tablet (50 mg total) by mouth daily. Take with or immediately following a meal.  90 tablet  3  . acetaminophen (TYLENOL) 500 MG tablet Take 1,000 mg by mouth every 6 (six) hours as needed. For pain      . clopidogrel (PLAVIX) 75 MG tablet Take 1 tablet (75 mg total) by mouth daily.  30 tablet  0  . cyclobenzaprine (FLEXERIL) 10 MG tablet Take 1 tablet (10 mg total) by mouth 3 (three) times daily as needed for muscle spasms.   20 tablet  0  . HYDROcodone-ibuprofen (VICOPROFEN) 7.5-200 MG per tablet Take 1 tablet by mouth every 8 (eight) hours as needed for pain.  10 tablet  0     Past Medical History  Diagnosis Date  . PAF (paroxysmal atrial fibrillation)   . Hypertension   . Peripheral vascular disease 06/11/2009    Bilateral common iliac kissing stents (8X24 mm) done by  Dr. Brabham. 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 She underwent successful balloon angioplasty.   . Hyperlipidemia   . Tobacco abuse   . Claudication   . S/P cardiac cath 2009    Normal     Past Surgical History  Procedure Date  . Cardiac catheterization     2009  . Back surgery      Family History  Problem Relation Age of Onset  . Hypertension Mother   . Heart disease Father   . Heart attack Father   . Hypertension Brother      History   Social History  . Marital Status: Single    Spouse Name: N/A    Number of Children: N/A  . Years of Education: N/A   Occupational History  . Not on file.   Social History Main Topics  . Smoking status: Former Smoker    Types: Cigarettes    Quit date: 05/11/2012  . Smokeless tobacco: Not on file     Comment:   electronic cigarette  . Alcohol Use: No  . Drug Use: No  . Sexually Active: Yes   Other Topics Concern  . Not on file   Social History Narrative  . No narrative on file       PHYSICAL EXAM   BP 128/84  Pulse 85  Resp 12  Ht 5' 6" (1.676 m)  Wt 177 lb 6.4 oz (80.468 kg)  BMI 28.63 kg/m2 Constitutional: She is oriented to person, place, and time. She appears well-developed and well-nourished. No distress.  HENT: No nasal discharge.  Head: Normocephalic and atraumatic.  Eyes: Pupils are equal and round. Right eye exhibits no discharge. Left eye exhibits no discharge.  Neck: Normal range of motion. Neck supple. No JVD present. No thyromegaly present.  Cardiovascular: Normal rate,  regular rhythm, normal heart sounds. Exam reveals no gallop and no friction rub. No murmur heard.  Pulmonary/Chest: Effort normal and breath sounds normal. No stridor. No respiratory distress. She has no wheezes. She has no rales. She exhibits no tenderness.  Abdominal: Soft. Bowel sounds are normal. She exhibits no distension. There is no tenderness. There is no rebound and no guarding.  Musculoskeletal: Normal range of motion. She exhibits no edema and no tenderness.  Neurological: She is alert and oriented to person, place, and time. Coordination normal.  Skin: Skin is warm and dry. No rash noted. She is not diaphoretic. No erythema. No pallor.  Psychiatric: She has a normal mood and affect. Her behavior is normal. Judgment and thought content normal.  Vascular: Femoral pulses are diminished bilaterally.   ASSESSMENT AND PLAN   

## 2012-10-05 NOTE — Patient Instructions (Addendum)
Your physician recommends that you return for lab work in: one week  Your physician has recommended you make the following change in your medication: START Hydrocodone 1 tablet up to every 8 hours for pain if needed.  DECREASE your aspirin to 81 mg  Your procedure is scheduled for 10/19/12.  Please refer to your instruction letter

## 2012-10-07 ENCOUNTER — Encounter: Payer: Self-pay | Admitting: Cardiovascular Disease

## 2012-10-07 NOTE — Assessment & Plan Note (Signed)
The patient has known history of peripheral arterial disease with bilateral common iliac artery stenting. She underwent balloon angioplasty in July of this year for severe in-stent restenosis of the right common iliac artery. There was mild instent restenosis in the left common iliac artery stent. Her claudication initially resolved completely but has progressed over the last few months. Aortoiliac duplex showed severe restenosis again in the right common iliac artery stent with a velocity above 500. Due to that, I recommend proceeding with iliac angiography and right common iliac artery stenting. The patient will need a covered stent in order to minimize the risk of in-stent restenosis in the future. She will scheduled in 2 weeks to allow improvement in her neck pain. I advised her not to use any nonsteroidal anti-inflammatory medications due to the fact that she is on aspirin and Plavix. I provided her with short-term oxycodone. I advised her to seek evaluation if her symptoms do not improve.

## 2012-10-07 NOTE — Assessment & Plan Note (Signed)
Lab Results  Component Value Date   CHOL 188 06/08/2012   HDL 46.00 06/08/2012   LDLCALC  Value: 147        Total Cholesterol/HDL:CHD Risk Coronary Heart Disease Risk Table                     Men   Women  1/2 Average Risk   3.4   3.3* 08/11/2008   LDLDIRECT 77.8 06/08/2012   TRIG 474.0* 06/08/2012   CHOLHDL 4 06/08/2012   Lipid profile improved after the addition of atorvastatin. I also started her on 3 g of fish oil. She will need a followup lipid profile in the near future.

## 2012-10-14 ENCOUNTER — Other Ambulatory Visit (INDEPENDENT_AMBULATORY_CARE_PROVIDER_SITE_OTHER): Payer: Medicare Other

## 2012-10-14 DIAGNOSIS — I739 Peripheral vascular disease, unspecified: Secondary | ICD-10-CM

## 2012-10-14 DIAGNOSIS — Z01812 Encounter for preprocedural laboratory examination: Secondary | ICD-10-CM

## 2012-10-14 DIAGNOSIS — I10 Essential (primary) hypertension: Secondary | ICD-10-CM

## 2012-10-14 LAB — CBC WITH DIFFERENTIAL/PLATELET
Basophils Absolute: 0.1 10*3/uL (ref 0.0–0.1)
Basophils Relative: 0.9 % (ref 0.0–3.0)
Eosinophils Absolute: 0.1 10*3/uL (ref 0.0–0.7)
MCHC: 32.6 g/dL (ref 30.0–36.0)
MCV: 91.5 fl (ref 78.0–100.0)
Monocytes Absolute: 0.5 10*3/uL (ref 0.1–1.0)
Neutrophils Relative %: 45.4 % (ref 43.0–77.0)
Platelets: 219 10*3/uL (ref 150.0–400.0)
RBC: 4.8 Mil/uL (ref 3.87–5.11)
RDW: 13.3 % (ref 11.5–14.6)

## 2012-10-14 LAB — BASIC METABOLIC PANEL
BUN: 19 mg/dL (ref 6–23)
GFR: 97.54 mL/min (ref >60.00)
Potassium: 4.2 mEq/L (ref 3.5–5.1)

## 2012-10-17 ENCOUNTER — Encounter (HOSPITAL_COMMUNITY): Payer: Self-pay | Admitting: Pharmacy Technician

## 2012-10-18 ENCOUNTER — Other Ambulatory Visit: Payer: Self-pay | Admitting: Cardiovascular Disease

## 2012-10-18 DIAGNOSIS — I739 Peripheral vascular disease, unspecified: Secondary | ICD-10-CM

## 2012-10-19 ENCOUNTER — Encounter (HOSPITAL_COMMUNITY)
Admission: RE | Disposition: A | Discharge status: Home / Self Care | Payer: Self-pay | Source: Ambulatory Visit | Attending: Cardiovascular Disease

## 2012-10-19 ENCOUNTER — Ambulatory Visit (HOSPITAL_COMMUNITY)
Admission: RE | Admit: 2012-10-19 | Discharge: 2012-10-19 | Disposition: A | Discharge status: Home / Self Care | Payer: Medicare Other | Source: Ambulatory Visit | Attending: Cardiovascular Disease | Admitting: Cardiovascular Disease

## 2012-10-19 ENCOUNTER — Other Ambulatory Visit: Payer: Self-pay | Admitting: Cardiovascular Disease

## 2012-10-19 DIAGNOSIS — Z79899 Other long term (current) drug therapy: Secondary | ICD-10-CM | POA: Insufficient documentation

## 2012-10-19 DIAGNOSIS — I10 Essential (primary) hypertension: Secondary | ICD-10-CM | POA: Insufficient documentation

## 2012-10-19 DIAGNOSIS — E785 Hyperlipidemia, unspecified: Secondary | ICD-10-CM | POA: Insufficient documentation

## 2012-10-19 DIAGNOSIS — I739 Peripheral vascular disease, unspecified: Secondary | ICD-10-CM | POA: Insufficient documentation

## 2012-10-19 DIAGNOSIS — I4891 Unspecified atrial fibrillation: Secondary | ICD-10-CM | POA: Insufficient documentation

## 2012-10-19 DIAGNOSIS — Z7901 Long term (current) use of anticoagulants: Secondary | ICD-10-CM | POA: Insufficient documentation

## 2012-10-19 DIAGNOSIS — Y831 Surgical operation with implant of artificial internal device as the cause of abnormal reaction of the patient, or of later complication, without mention of misadventure at the time of the procedure: Secondary | ICD-10-CM | POA: Insufficient documentation

## 2012-10-19 DIAGNOSIS — I70219 Atherosclerosis of native arteries of extremities with intermittent claudication, unspecified extremity: Secondary | ICD-10-CM

## 2012-10-19 DIAGNOSIS — I708 Atherosclerosis of other arteries: Secondary | ICD-10-CM | POA: Insufficient documentation

## 2012-10-19 DIAGNOSIS — T82898A Other specified complication of vascular prosthetic devices, implants and grafts, initial encounter: Secondary | ICD-10-CM | POA: Insufficient documentation

## 2012-10-19 HISTORY — PX: ABDOMINAL AORTAGRAM: SHX5454

## 2012-10-19 LAB — POCT ACTIVATED CLOTTING TIME: Activated Clotting Time: 184 seconds

## 2012-10-19 SURGERY — ABDOMINAL AORTAGRAM
Anesthesia: LOCAL

## 2012-10-19 MED ORDER — ASPIRIN 81 MG PO CHEW
324.0000 mg | CHEWABLE_TABLET | ORAL | Status: AC
  Administered 2012-10-19: 324 mg via ORAL

## 2012-10-19 MED ORDER — ASPIRIN 81 MG PO CHEW
CHEWABLE_TABLET | ORAL | Status: AC
  Filled 2012-10-19: qty 4

## 2012-10-19 MED ORDER — SODIUM CHLORIDE 0.9 % IV SOLN: INTRAVENOUS | Status: AC

## 2012-10-19 MED ORDER — HEPARIN SODIUM (PORCINE) 1000 UNIT/ML IJ SOLN
INTRAMUSCULAR | Status: AC
  Filled 2012-10-19: qty 1

## 2012-10-19 MED ORDER — SODIUM CHLORIDE 0.9 % IJ SOLN: 3.0000 mL | Freq: Two times a day (BID) | INTRAMUSCULAR | Status: DC

## 2012-10-19 MED ORDER — CLOPIDOGREL BISULFATE 300 MG PO TABS
ORAL_TABLET | ORAL | Status: AC
  Filled 2012-10-19: qty 1

## 2012-10-19 MED ORDER — FENTANYL CITRATE 0.05 MG/ML IJ SOLN
INTRAMUSCULAR | Status: AC
  Filled 2012-10-19: qty 2

## 2012-10-19 MED ORDER — MIDAZOLAM HCL 2 MG/2ML IJ SOLN
INTRAMUSCULAR | Status: AC
  Filled 2012-10-19: qty 2

## 2012-10-19 MED ORDER — HEPARIN (PORCINE) IN NACL 2-0.9 UNIT/ML-% IJ SOLN
INTRAMUSCULAR | Status: AC
  Filled 2012-10-19: qty 1000

## 2012-10-19 MED ORDER — CLOPIDOGREL BISULFATE 75 MG PO TABS: 75.0000 mg | ORAL_TABLET | Freq: Every day | ORAL | Status: DC

## 2012-10-19 MED ORDER — ACETAMINOPHEN 325 MG PO TABS: 650.0000 mg | ORAL_TABLET | ORAL | Status: DC | PRN

## 2012-10-19 MED ORDER — SODIUM CHLORIDE 0.9 % IV SOLN
INTRAVENOUS | Status: DC
  Administered 2012-10-19: 08:00:00 via INTRAVENOUS

## 2012-10-19 MED ORDER — SODIUM CHLORIDE 0.9 % IV SOLN: 250.0000 mL | INTRAVENOUS | Status: DC | PRN

## 2012-10-19 MED ORDER — NITROGLYCERIN 0.2 MG/ML ON CALL CATH LAB
INTRAVENOUS | Status: AC
  Filled 2012-10-19: qty 1

## 2012-10-19 MED ORDER — SODIUM CHLORIDE 0.9 % IJ SOLN: 3.0000 mL | INTRAMUSCULAR | Status: DC | PRN

## 2012-10-19 MED ORDER — LIDOCAINE HCL (PF) 1 % IJ SOLN
INTRAMUSCULAR | Status: AC
  Filled 2012-10-19: qty 30

## 2012-10-19 NOTE — CV Procedure (Signed)
     PERIPHERAL VASCULAR PROCEDURE  NAME:  Kayla Hopkins   MRN: [id number] DOB:  [birth year]   ADMIT DATE: 10/19/2012  Performing Cardiologist: [name] Primary Physician: Devra Dopp, MD   Procedures Performed:  Abdominal Aortic Angiogram with iliac angiography without runoff.   Right common iliac artery covered stent placement for significant in-stent restenosis.  Mynx closure device.   Indication(s):   Claudication   Consent: The procedure with Risks/Benefits/Alternatives and Indications was reviewed with the patient .  All questions were answered.  Medications:    Contrast:  70 mL of Visipaque   Procedural details: The right groin was prepped, draped, and anesthetized with 1% lidocaine. Using modified Seldinger technique, a micropuncture sheath French sheath was introduced into the right common femoral artery. This was exchanged over the versicolor wire to a 5 [place] sheath. A 5 Fr Short Pigtail Catheter was advanced of over a Versicore wire into the descending Aorta to a level just below the renal arteries. A power injection of 15ml/sec contrast over 1 sec was performed for Abdominal Aortic Angiography.  This was also performed in the RAO position. Pressure measurement was performed in the abdominal aorta as well as the right common femoral artery. There was a peak systolic gradient of 18 mm mercury without nitroglycerin. There was significant in-stent restenosis in the right common iliac artery.    Interventional Procedure:  The 5 French sheath was exchanged into a 7 [place] Bright tip sheath. 5000 units of unfractionated heparin was given with an additional 1500 units of unfractionated heparin given due to ACT of 185. Given that this was a recurrent in-stent restenosis, I decided to use a covered stent. The sheath was advanced into the distal aorta. A 7 x 22 mm I-cast covered stent was deployed carefully to 8 atmospheres. This was post dilated with an 8 mm  balloon to 10 atmospheres. Angiography showed excellent results. The sheath was exchanged to a short 7 [place] sheath which was then removed and the site was closed with a Mynx device.  Hemodynamics:  Central Aortic Pressure / Mean Aortic Pressure: 167/72  Findings:  Abdominal aorta: Moderate diffuse atherosclerosis in the distal aorta.  Right common iliac artery: 80% in-stent restenosis.  Right internal iliac artery: Mild diffuse disease.  Right external iliac artery: Minor irregularities.  Right common femoral artery: Minor irregularities.  Left common iliac artery:  Mild 20-30% in-stent restenosis. There is a 30% stenosis distally.  Left internal iliac artery: Minor irregularities.  Conclusions: 1. Significant in-stent restenosis in the right common iliac artery. 2. successful covered stent placement with a 7 x 22 mm stent which was post dilated with an 8 mm balloon.  Recommendations:  Dual antiplatelet therapy for at least one month.   [name], MD, [facility] 10/19/2012 9:43 AM

## 2012-10-19 NOTE — Interval H&P Note (Signed)
History and Physical Interval Note:  10/19/2012 8:47 AM  Kayla Hopkins  has presented today for surgery, with the diagnosis of Claudication  The various methods of treatment have been discussed with the patient and family. After consideration of risks, benefits and other options for treatment, the patient has consented to  Procedure(s) (LRB) with comments: ABDOMINAL AORTAGRAM (N/A) as a surgical intervention .  The patient's history has been reviewed, patient examined, no change in status, stable for surgery.  I have reviewed the patient's chart and labs.  Questions were answered to the patient's satisfaction.     [name]

## 2012-10-19 NOTE — H&P (View-Only) (Signed)
HPI  [name] is a 51 year old female who is here today for followup visit.  She has no known CAD per cath back in 2009. She does have known history of peripheral arterial disease, paroxysmal atrial fibrillation, hypertension and hyperlipidemia. She was seen in July for significant right lower extremity claudication with significant drop in ABI. In 2010, she had significant bilateral hip claudication. She underwent angiography by Dr. Myra Gianotti which showed significant bilateral ostial common iliac disease. She underwent bilateral kissing stent placement with an 8 x 24 mm Genesis stent.  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 She had successful balloon angioplasty without significant residual stenosis. Her claudication resolved completely after the proecdure. Since then, there has been gradual increase in claudication on both sides especially when goes upstairs. Symptoms are worse on the right side than the left side. The patient is known to have chronic low back pain as well. She woke up 2 days ago with a stiff neck with pain.  Allergies  Allergen Reactions  . Pradaxa (Dabigatran Etexilate Mesylate)     Gastritis   . Simvastatin     Leg pains      Current Outpatient Prescriptions on File Prior to Visit  Medication Sig Dispense Refill  . atorvastatin (LIPITOR) 40 MG tablet Take 1 tablet (40 mg total) by mouth daily.  30 tablet  5  . metoprolol succinate (TOPROL-XL) 50 MG 24 hr tablet Take 1 tablet (50 mg total) by mouth daily. Take with or immediately following a meal.  90 tablet  3  . acetaminophen (TYLENOL) 500 MG tablet Take 1,000 mg by mouth every 6 (six) hours as needed. For pain      . clopidogrel (PLAVIX) 75 MG tablet Take 1 tablet (75 mg total) by mouth daily.  30 tablet  0  . cyclobenzaprine (FLEXERIL) 10 MG tablet Take 1 tablet (10 mg total) by mouth 3 (three) times daily as needed for muscle spasms.   20 tablet  0  . HYDROcodone-ibuprofen (VICOPROFEN) 7.5-200 MG per tablet Take 1 tablet by mouth every 8 (eight) hours as needed for pain.  10 tablet  0     Past Medical History  Diagnosis Date  . PAF (paroxysmal atrial fibrillation)   . Hypertension   . Peripheral vascular disease 06/11/2009    Bilateral common iliac kissing stents (8X24 mm) done by  Dr. Myra Gianotti. 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 She underwent successful balloon angioplasty.   . Hyperlipidemia   . Tobacco abuse   . Claudication   . S/P cardiac cath 2009    Normal     Past Surgical History  Procedure Date  . Cardiac catheterization     2009  . Back surgery      Family History  Problem Relation Age of Onset  . Hypertension Mother   . Heart disease Father   . Heart attack Father   . Hypertension Brother      History   Social History  . Marital Status: Single    Spouse Name: N/A    Number of Children: N/A  . Years of Education: N/A   Occupational History  . Not on file.   Social History Main Topics  . Smoking status: Former Smoker    Types: Cigarettes    Quit date: 05/11/2012  . Smokeless tobacco: Not on file     Comment:  electronic cigarette  . Alcohol Use: No  . Drug Use: No  . Sexually Active: Yes   Other Topics Concern  . Not on file   Social History Narrative  . No narrative on file       PHYSICAL EXAM   BP 128/84  Pulse 85  Resp 12  Ht 5\' 6"  (1.676 m)  Wt 177 lb 6.4 oz (80.468 kg)  BMI 28.63 kg/m2 Constitutional: She is oriented to person, place, and time. She appears well-developed and well-nourished. No distress.  HENT: No nasal discharge.  Head: Normocephalic and atraumatic.  Eyes: Pupils are equal and round. Right eye exhibits no discharge. Left eye exhibits no discharge.  Neck: Normal range of motion. Neck supple. No JVD present. No thyromegaly present.  Cardiovascular: Normal rate,  regular rhythm, normal heart sounds. Exam reveals no gallop and no friction rub. No murmur heard.  Pulmonary/Chest: Effort normal and breath sounds normal. No stridor. No respiratory distress. She has no wheezes. She has no rales. She exhibits no tenderness.  Abdominal: Soft. Bowel sounds are normal. She exhibits no distension. There is no tenderness. There is no rebound and no guarding.  Musculoskeletal: Normal range of motion. She exhibits no edema and no tenderness.  Neurological: She is alert and oriented to person, place, and time. Coordination normal.  Skin: Skin is warm and dry. No rash noted. She is not diaphoretic. No erythema. No pallor.  Psychiatric: She has a normal mood and affect. Her behavior is normal. Judgment and thought content normal.  Vascular: Femoral pulses are diminished bilaterally.   ASSESSMENT AND PLAN

## 2012-10-20 ENCOUNTER — Other Ambulatory Visit: Payer: Self-pay

## 2012-10-20 ENCOUNTER — Telehealth: Payer: Self-pay | Admitting: Cardiovascular Disease

## 2012-10-20 MED ORDER — CLOPIDOGREL BISULFATE 75 MG PO TABS: 75.0000 mg | ORAL_TABLET | Freq: Every day | ORAL | Status: DC

## 2012-10-20 NOTE — Telephone Encounter (Signed)
plz return call to pt [phone] regarding recent procedure and prescription that was suppose to be called in.

## 2012-10-20 NOTE — Telephone Encounter (Signed)
Pt is calling because her rx was not at [facility] on [address].  I redirected the rx to this pharmacy, (it originally was sent to [facility] on [address]).

## 2012-10-25 ENCOUNTER — Telehealth: Payer: Self-pay

## 2012-10-25 NOTE — Telephone Encounter (Signed)
N/A at pt number.  LMTC.

## 2012-10-25 NOTE — Telephone Encounter (Signed)
[facility] was notified.

## 2012-10-25 NOTE — Telephone Encounter (Signed)
She should request those from her primary care physician.

## 2012-10-25 NOTE — Telephone Encounter (Signed)
Requesting refill of norco and cyclobenzaprine.  Is this ok?

## 2012-10-26 NOTE — Telephone Encounter (Signed)
Pt was notified.  

## 2012-11-03 ENCOUNTER — Ambulatory Visit: Payer: Medicare Other | Admitting: Cardiology

## 2012-11-04 ENCOUNTER — Encounter (INDEPENDENT_AMBULATORY_CARE_PROVIDER_SITE_OTHER): Payer: Medicare Other | Admitting: Cardiology

## 2012-11-04 DIAGNOSIS — I739 Peripheral vascular disease, unspecified: Secondary | ICD-10-CM

## 2012-11-16 ENCOUNTER — Ambulatory Visit (INDEPENDENT_AMBULATORY_CARE_PROVIDER_SITE_OTHER): Payer: Medicare Other | Admitting: Cardiovascular Disease

## 2012-11-16 ENCOUNTER — Encounter: Payer: Self-pay | Admitting: Cardiovascular Disease

## 2012-11-16 VITALS — BP 136/90 | HR 72 | Ht 66.0 in | Wt 179.0 lb

## 2012-11-16 DIAGNOSIS — I739 Peripheral vascular disease, unspecified: Secondary | ICD-10-CM

## 2012-11-16 DIAGNOSIS — E785 Hyperlipidemia, unspecified: Secondary | ICD-10-CM

## 2012-11-16 NOTE — Assessment & Plan Note (Signed)
Lab Results  Component Value Date   CHOL 188 06/08/2012   HDL 46.00 06/08/2012   LDLCALC  Value: 147        Total Cholesterol/HDL:CHD Risk Coronary Heart Disease Risk Table                     Men   Women  1/2 Average Risk   3.4   3.3* 08/11/2008   LDLDIRECT 77.8 06/08/2012   TRIG 474.0* 06/08/2012   CHOLHDL 4 06/08/2012   Lipid profile improved after the addition of atorvastatin. I also started her on 3 g of fish oil but she is not taking this on a regular basis. She will need a followup lipid profile in 6-12 months.

## 2012-11-16 NOTE — Progress Notes (Signed)
HPI  [name] is a 52 year old female who is here today for followup visit.  She has no known CAD per cath back in 2009. She does have known history of peripheral arterial disease, paroxysmal atrial fibrillation, hypertension and hyperlipidemia. She was seen in July for significant right lower extremity claudication with significant drop in ABI. In 2010, she had significant bilateral hip claudication. She underwent angiography by Dr. Myra Gianotti which showed significant bilateral ostial common iliac disease. She underwent bilateral kissing stent placement with an 8 x 24 mm Genesis stent.  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 She had successful balloon angioplasty without significant residual stenosis. Her claudication resolved completely after the proecdure. Since then, there has been gradual increase in claudication on both sides especially when goes upstairs. Symptoms are worse on the right side than the left side. Duplex ultrasound showed recurrent in-stent restenosis on the right side. She underwent angiography which confirmed this and thus I decided to place a covered stent which was done successfully. She reports resolution of claudication. She has no claudication on the left side. She reports chronic pain related to sciatica.   Allergies  Allergen Reactions  . Pradaxa (Dabigatran Etexilate Mesylate)     Gastritis   . Simvastatin     Leg pains      Current Outpatient Prescriptions on File Prior to Visit  Medication Sig Dispense Refill  . aspirin EC 81 MG tablet Take 81 mg by mouth daily.      [name] atorvastatin (LIPITOR) 40 MG tablet Take 1 tablet (40 mg total) by mouth daily.  30 tablet  5  . clopidogrel (PLAVIX) 75 MG tablet Take 1 tablet (75 mg total) by mouth daily.  30 tablet  6  . cyclobenzaprine (FLEXERIL) 10 MG tablet Take 1 tablet (10 mg total) by mouth 3 (three) times daily as needed for muscle  spasms.  20 tablet  0  . fish oil-omega-3 fatty acids 1000 MG capsule Take 1 g by mouth daily.      [name] HYDROcodone-acetaminophen (NORCO) 7.5-325 MG per tablet Take 1 tablet by mouth every 8 (eight) hours as needed for pain.  60 tablet  0  . metoprolol succinate (TOPROL-XL) 50 MG 24 hr tablet Take 1 tablet (50 mg total) by mouth daily. Take with or immediately following a meal.  90 tablet  3     Past Medical History  Diagnosis Date  . PAF (paroxysmal atrial fibrillation)   . Hypertension   . Peripheral vascular disease 06/11/2009    Bilateral common iliac kissing stents (8X24 mm) . 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 She underwent successful balloon angioplasty.  Restenosis in [facility] in 12/13 s/p  Covered stent  . Hyperlipidemia   . Tobacco abuse   . Claudication   . S/P cardiac cath 2009    Normal     Past Surgical History  Procedure Date  . Cardiac catheterization     2009  . Back surgery      Family History  Problem Relation Age of Onset  . Hypertension Mother   . Heart disease Father   . Heart attack Father   . Hypertension Brother      History   Social History  . Marital Status: Single    Spouse Name: N/A    Number of Children: N/A  . Years of Education:  N/A   Occupational History  . Not on file.   Social History Main Topics  . Smoking status: Former Smoker    Types: Cigarettes    Quit date: 05/11/2012  . Smokeless tobacco: Not on file     Comment: electronic cigarette  . Alcohol Use: No  . Drug Use: No  . Sexually Active: Yes   Other Topics Concern  . Not on file   Social History Narrative  . No narrative on file       PHYSICAL EXAM   BP 136/90  Pulse 72  Ht 5\' 6"  (1.676 m)  Wt 179 lb (81.194 kg)  BMI 28.89 kg/m2  SpO2 97% Constitutional: She is oriented to person, place, and time. She appears well-developed and well-nourished. No distress.  HENT: No nasal  discharge.  Head: Normocephalic and atraumatic.  Eyes: Pupils are equal and round. Right eye exhibits no discharge. Left eye exhibits no discharge.  Neck: Normal range of motion. Neck supple. No JVD present. No thyromegaly present.  Cardiovascular: Normal rate, regular rhythm, normal heart sounds. Exam reveals no gallop and no friction rub. No murmur heard.  Pulmonary/Chest: Effort normal and breath sounds normal. No stridor. No respiratory distress. She has no wheezes. She has no rales. She exhibits no tenderness.  Abdominal: Soft. Bowel sounds are normal. She exhibits no distension. There is no tenderness. There is no rebound and no guarding.  Musculoskeletal: Normal range of motion. She exhibits no edema and no tenderness.  Neurological: She is alert and oriented to person, place, and time. Coordination normal.  Skin: Skin is warm and dry. No rash noted. She is not diaphoretic. No erythema. No pallor.  Psychiatric: She has a normal mood and affect. Her behavior is normal. Judgment and thought content normal.  Vascular: Femoral pulses are diminished bilaterally.   ASSESSMENT AND PLAN

## 2012-11-16 NOTE — Patient Instructions (Addendum)
Your physician has requested that you have an aortailiac duplex in 3 months.  During this test, an ultrasound is used to evaluate the aorta. Allow 30 minutes for this exam. Do not eat after midnight the day before and avoid carbonated beverages  Your physician wants you to follow-up in: 6 months.   You will receive a reminder letter in the mail two months in advance. If you don't receive a letter, please call our office to schedule the follow-up appointment.

## 2012-11-16 NOTE — Assessment & Plan Note (Addendum)
She reports complete resolution of claudication after recent covered stent placement for recurrent in-stent restenosis in the right, iliac artery. ABI is back to normal. There was mild instent restenosis in the left side which would have to be monitored. Continue medical therapy. Aortoiliac duplex will be done in 3 months.

## 2013-01-05 ENCOUNTER — Encounter (HOSPITAL_COMMUNITY): Payer: Self-pay | Admitting: Emergency Medicine

## 2013-01-05 ENCOUNTER — Emergency Department (HOSPITAL_COMMUNITY)
Admission: EM | Admit: 2013-01-05 | Discharge: 2013-01-05 | Disposition: A | Discharge status: Home / Self Care | Payer: Medicare Other | Attending: Emergency Medicine | Admitting: Emergency Medicine

## 2013-01-05 DIAGNOSIS — E785 Hyperlipidemia, unspecified: Secondary | ICD-10-CM | POA: Insufficient documentation

## 2013-01-05 DIAGNOSIS — Z79899 Other long term (current) drug therapy: Secondary | ICD-10-CM | POA: Insufficient documentation

## 2013-01-05 DIAGNOSIS — M5430 Sciatica, unspecified side: Secondary | ICD-10-CM | POA: Insufficient documentation

## 2013-01-05 DIAGNOSIS — M5416 Radiculopathy, lumbar region: Secondary | ICD-10-CM

## 2013-01-05 DIAGNOSIS — R209 Unspecified disturbances of skin sensation: Secondary | ICD-10-CM | POA: Insufficient documentation

## 2013-01-05 DIAGNOSIS — M549 Dorsalgia, unspecified: Secondary | ICD-10-CM

## 2013-01-05 DIAGNOSIS — Z8679 Personal history of other diseases of the circulatory system: Secondary | ICD-10-CM | POA: Insufficient documentation

## 2013-01-05 DIAGNOSIS — I10 Essential (primary) hypertension: Secondary | ICD-10-CM | POA: Insufficient documentation

## 2013-01-05 DIAGNOSIS — M545 Low back pain, unspecified: Secondary | ICD-10-CM | POA: Insufficient documentation

## 2013-01-05 DIAGNOSIS — Z9889 Other specified postprocedural states: Secondary | ICD-10-CM | POA: Insufficient documentation

## 2013-01-05 DIAGNOSIS — G8929 Other chronic pain: Secondary | ICD-10-CM

## 2013-01-05 DIAGNOSIS — Z87891 Personal history of nicotine dependence: Secondary | ICD-10-CM | POA: Insufficient documentation

## 2013-01-05 DIAGNOSIS — IMO0002 Reserved for concepts with insufficient information to code with codable children: Secondary | ICD-10-CM | POA: Insufficient documentation

## 2013-01-05 DIAGNOSIS — Z9861 Coronary angioplasty status: Secondary | ICD-10-CM | POA: Insufficient documentation

## 2013-01-05 DIAGNOSIS — I4891 Unspecified atrial fibrillation: Secondary | ICD-10-CM | POA: Insufficient documentation

## 2013-01-05 DIAGNOSIS — Z7982 Long term (current) use of aspirin: Secondary | ICD-10-CM | POA: Insufficient documentation

## 2013-01-05 MED ORDER — PREDNISONE 20 MG PO TABS: ORAL_TABLET | ORAL | Status: DC

## 2013-01-05 MED ORDER — HYDROCODONE-ACETAMINOPHEN 5-325 MG PO TABS
2.0000 | ORAL_TABLET | Freq: Once | ORAL | Status: AC
  Administered 2013-01-05: 2 via ORAL
  Filled 2013-01-05: qty 2

## 2013-01-05 MED ORDER — HYDROCODONE-ACETAMINOPHEN 5-325 MG PO TABS: 1.0000 | ORAL_TABLET | Freq: Four times a day (QID) | ORAL | Status: DC | PRN

## 2013-01-05 MED ORDER — PREDNISONE 20 MG PO TABS
60.0000 mg | ORAL_TABLET | Freq: Once | ORAL | Status: AC
  Administered 2013-01-05: 60 mg via ORAL
  Filled 2013-01-05: qty 3

## 2013-01-05 NOTE — ED Provider Notes (Signed)
History  This chart was scribed for non-physician practitioner working with Ward Givens, MD, by Candelaria Stagers, ED Scribe. This patient was seen in room WTR7/WTR7 and the patient's care was started at 4:19 PM   CSN: [id number]  Arrival date & time 01/05/13  1535   First MD Initiated Contact with Patient 01/05/13 1553      Chief Complaint  Patient presents with  . Back Pain    The history is provided by the patient. No language interpreter was used.   Kayla Hopkins is a 52 y.o. female who presents to the Emergency Department complaining of chronic lower back pain that became worse about six months ago.  Pt had lumbar laminectomy about 1.5 years ago, performed by Dr. Tedra Coupe.  Pt was seen about six months after the surgery along with PT with no complications.  Pt reports that the pain returned about six months ago, worse than before the surgery, and has increasingly gotten worse.  She states that pain is mostly on the right side and she is also experiencing sciatic pain to the left side.  Pt reports that two weeks ago she began experiencing numbness to the left toes.  She reports she called Dr. Tedra Coupe after pain returned six months ago and could not be seen, she followed up with her PCP who referred her to pain management.  Pain management could not access her files from Dr. Tedra Coupe.  Using a heating pad makes the pain somewhat better.  She has taken ibuprofen and tylenol with no relief.  Pt has h/o afib and HTN.  Pt smokes.     Pt reports that she feels like her legs are going to give out when walking.  Past Medical History  Diagnosis Date  . PAF (paroxysmal atrial fibrillation)   . Hypertension   . Peripheral vascular disease 06/11/2009    Bilateral common iliac kissing stents (8X24 mm) . 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 She underwent successful balloon angioplasty.  Restenosis in [facility] in 12/13 s/p  Covered  stent  . Hyperlipidemia   . Tobacco abuse   . Claudication   . S/P cardiac cath 2009    Normal    Past Surgical History  Procedure Laterality Date  . Cardiac catheterization      2009  . Back surgery      Family History  Problem Relation Age of Onset  . Hypertension Mother   . Heart disease Father   . Heart attack Father   . Hypertension Brother     History  Substance Use Topics  . Smoking status: Former Smoker    Types: Cigarettes    Quit date: 05/11/2012  . Smokeless tobacco: Not on file     Comment: electronic cigarette  . Alcohol Use: No    OB History   Grav Para Term Preterm Abortions TAB SAB Ect Mult Living                  Review of Systems  Musculoskeletal: Positive for back pain.  Neurological: Positive for numbness (numbness to left toes).  All other systems reviewed and are negative.    Allergies  Pradaxa and Simvastatin  Home Medications   Current Outpatient Rx  Name  Route  Sig  Dispense  Refill  . aspirin EC 81 MG tablet   Oral   Take 81 mg by mouth every morning.          [name]  atorvastatin (LIPITOR) 40 MG tablet   Oral   Take 40 mg by mouth every evening.         . fish oil-omega-3 fatty acids 1000 MG capsule   Oral   Take 1 g by mouth every morning.          [name] HYDROcodone-acetaminophen (NORCO) 7.5-325 MG per tablet   Oral   Take 1 tablet by mouth every 8 (eight) hours as needed (pain).         . metoprolol succinate (TOPROL-XL) 50 MG 24 hr tablet   Oral   Take 50 mg by mouth every evening. Take with or immediately following a meal.           BP 142/87  Pulse 86  Temp(Src) 97.7 F (36.5 C) (Oral)  Resp 16  SpO2 98%  Physical Exam  Nursing note and vitals reviewed. Constitutional: She is oriented to person, place, and time. She appears well-developed and well-nourished. No distress.  HENT:  Head: Normocephalic and atraumatic.  Eyes: Conjunctivae and EOM are normal.  Neck: Normal range of motion. Neck supple.  No tracheal deviation present.  Cardiovascular: Normal rate, regular rhythm and normal heart sounds.   Pulmonary/Chest: Effort normal and breath sounds normal. No respiratory distress.  Musculoskeletal: Normal range of motion.  Tenderness to palpation of .  Right para spinal muscle spasm noted.  Right sided back pain reproducible with left straight leg raise. Pedal pulses strong and equal bilaterally.  Cap refill less than 3 sec.  Right leg 5/5 strength with knee flexion and extension, 3/5 on the left.  Right 5/5 strength with hip flexion, 3/5 on the left.      Neurological: She is alert and oriented to person, place, and time. No sensory deficit. Gait normal.  Skin: Skin is warm and dry.  Psychiatric: She has a normal mood and affect. Her behavior is normal.    ED Course  Procedures  DIAGNOSTIC STUDIES: Oxygen Saturation is 98% on room air, normal by my interpretation.    COORDINATION OF CARE:  4:23 PM Discussed course of care with my including.  Pt understands and agrees.       Labs Reviewed - No data to display No results found.   1. Lumbar radiculopathy   2. Chronic back pain       MDM  Acute on chronic back pain. Patient is supposed to be seen by pain management, however records not sent yet. She will f/u with her PCP. Norco given for pain along with prednisone taper. Patient aware of the importance of following up with PCP. No red flags concerning patient's back pain. She is able to ambulate in the emergency department. No signs of cauda equina. Patient is in no apparent distress. Stable for discharge. Return precautions discussed. Patient states understanding of plan and is agreeable.  I personally performed the services described in this documentation, which was scribed in my presence. The recorded information has been reviewed and is accurate.         Trevor Mace, PA-C 01/05/13 1712

## 2013-01-05 NOTE — ED Provider Notes (Signed)
Medical screening examination/treatment/procedure(s) were performed by non-physician practitioner and as supervising physician I was immediately available for consultation/collaboration. Devoria Albe, MD, FACEP   Ward Givens, MD 01/05/13 [phone]

## 2013-01-05 NOTE — ED Notes (Signed)
Pt c/o chronic back pain x6 mos.  Reports back surgery x2 years.

## 2013-01-10 ENCOUNTER — Encounter: Payer: Self-pay | Admitting: Physical Medicine & Rehabilitation

## 2013-01-24 ENCOUNTER — Ambulatory Visit (HOSPITAL_BASED_OUTPATIENT_CLINIC_OR_DEPARTMENT_OTHER): Payer: Medicare Other | Admitting: Physical Medicine & Rehabilitation

## 2013-01-24 ENCOUNTER — Encounter: Payer: Medicare Other | Attending: Physical Medicine & Rehabilitation

## 2013-01-24 ENCOUNTER — Encounter: Payer: Self-pay | Admitting: Physical Medicine & Rehabilitation

## 2013-01-24 ENCOUNTER — Ambulatory Visit: Payer: Medicare Other | Admitting: Physical Medicine & Rehabilitation

## 2013-01-24 VITALS — BP 152/90 | HR 86 | Resp 14 | Ht 66.0 in | Wt 183.0 lb

## 2013-01-24 DIAGNOSIS — Z5181 Encounter for therapeutic drug level monitoring: Secondary | ICD-10-CM

## 2013-01-24 DIAGNOSIS — M961 Postlaminectomy syndrome, not elsewhere classified: Secondary | ICD-10-CM

## 2013-01-24 DIAGNOSIS — M5416 Radiculopathy, lumbar region: Secondary | ICD-10-CM | POA: Insufficient documentation

## 2013-01-24 DIAGNOSIS — M549 Dorsalgia, unspecified: Secondary | ICD-10-CM

## 2013-01-24 DIAGNOSIS — I739 Peripheral vascular disease, unspecified: Secondary | ICD-10-CM | POA: Insufficient documentation

## 2013-01-24 DIAGNOSIS — M79609 Pain in unspecified limb: Secondary | ICD-10-CM | POA: Insufficient documentation

## 2013-01-24 DIAGNOSIS — IMO0002 Reserved for concepts with insufficient information to code with codable children: Secondary | ICD-10-CM

## 2013-01-24 DIAGNOSIS — M545 Low back pain, unspecified: Secondary | ICD-10-CM | POA: Insufficient documentation

## 2013-01-24 DIAGNOSIS — I10 Essential (primary) hypertension: Secondary | ICD-10-CM | POA: Insufficient documentation

## 2013-01-24 DIAGNOSIS — E785 Hyperlipidemia, unspecified: Secondary | ICD-10-CM | POA: Insufficient documentation

## 2013-01-24 DIAGNOSIS — Z79899 Other long term (current) drug therapy: Secondary | ICD-10-CM

## 2013-01-24 MED ORDER — TRAMADOL HCL 50 MG PO TABS: 50.0000 mg | ORAL_TABLET | Freq: Three times a day (TID) | ORAL | Status: DC | PRN

## 2013-01-24 MED ORDER — GABAPENTIN 300 MG PO CAPS: 300.0000 mg | ORAL_CAPSULE | Freq: Three times a day (TID) | ORAL | Status: DC
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
## 2013-01-26 ENCOUNTER — Ambulatory Visit (HOSPITAL_COMMUNITY): Admission: RE | Admit: 2013-01-26 | Payer: Medicare Other | Source: Ambulatory Visit

## 2013-02-03 ENCOUNTER — Other Ambulatory Visit: Payer: Self-pay | Admitting: *Deleted

## 2013-02-03 MED ORDER — ATORVASTATIN CALCIUM 40 MG PO TABS: 40.0000 mg | ORAL_TABLET | Freq: Every evening | ORAL | Status: DC

## 2013-02-03 NOTE — Telephone Encounter (Signed)
Fax Received. Refill Completed. Kayla Hopkins (R.M.A)   

## 2013-02-13 ENCOUNTER — Encounter: Payer: Medicare Other | Admitting: Cardiology

## 2013-02-13 ENCOUNTER — Ambulatory Visit: Payer: Medicare Other | Admitting: Cardiology

## 2013-02-13 ENCOUNTER — Ambulatory Visit: Payer: Medicare Other | Admitting: Physical Medicine & Rehabilitation

## 2013-03-31 ENCOUNTER — Other Ambulatory Visit: Payer: Self-pay

## 2013-03-31 MED ORDER — GABAPENTIN 300 MG PO CAPS: 300.0000 mg | ORAL_CAPSULE | Freq: Three times a day (TID) | ORAL | Status: DC

## 2013-03-31 MED ORDER — TRAMADOL HCL 50 MG PO TABS: 50.0000 mg | ORAL_TABLET | Freq: Three times a day (TID) | ORAL | Status: DC | PRN

## 2013-04-04 ENCOUNTER — Telehealth: Payer: Self-pay | Admitting: *Deleted

## 2013-04-04 NOTE — Telephone Encounter (Signed)
Called for refills on tramadol and gabapentin.  These were filled 03/31/13. Pt vm "not accepting calls at this time."

## 2013-04-07 ENCOUNTER — Encounter (INDEPENDENT_AMBULATORY_CARE_PROVIDER_SITE_OTHER): Payer: Medicare Other

## 2013-04-07 DIAGNOSIS — I70219 Atherosclerosis of native arteries of extremities with intermittent claudication, unspecified extremity: Secondary | ICD-10-CM

## 2013-04-07 DIAGNOSIS — I739 Peripheral vascular disease, unspecified: Secondary | ICD-10-CM

## 2013-04-07 DIAGNOSIS — I700 Atherosclerosis of aorta: Secondary | ICD-10-CM

## 2013-04-13 ENCOUNTER — Ambulatory Visit: Payer: Medicare Other | Admitting: Cardiovascular Disease

## 2013-04-25 ENCOUNTER — Encounter: Payer: Medicare Other | Attending: Physical Medicine & Rehabilitation

## 2013-04-25 ENCOUNTER — Ambulatory Visit: Payer: Medicare Other | Admitting: Physical Medicine & Rehabilitation

## 2013-04-25 DIAGNOSIS — I10 Essential (primary) hypertension: Secondary | ICD-10-CM | POA: Insufficient documentation

## 2013-04-25 DIAGNOSIS — I739 Peripheral vascular disease, unspecified: Secondary | ICD-10-CM | POA: Insufficient documentation

## 2013-04-25 DIAGNOSIS — M545 Low back pain, unspecified: Secondary | ICD-10-CM | POA: Insufficient documentation

## 2013-04-25 DIAGNOSIS — M961 Postlaminectomy syndrome, not elsewhere classified: Secondary | ICD-10-CM | POA: Insufficient documentation

## 2013-04-25 DIAGNOSIS — M79609 Pain in unspecified limb: Secondary | ICD-10-CM | POA: Insufficient documentation

## 2013-04-25 DIAGNOSIS — E785 Hyperlipidemia, unspecified: Secondary | ICD-10-CM | POA: Insufficient documentation

## 2013-04-27 ENCOUNTER — Ambulatory Visit (INDEPENDENT_AMBULATORY_CARE_PROVIDER_SITE_OTHER): Payer: Medicare Other | Admitting: Family Medicine

## 2013-04-27 ENCOUNTER — Ambulatory Visit: Payer: Medicare Other

## 2013-04-27 VITALS — BP 146/84 | HR 86 | Temp 98.0°F | Resp 16 | Ht 66.0 in | Wt 189.0 lb

## 2013-04-27 DIAGNOSIS — M542 Cervicalgia: Secondary | ICD-10-CM

## 2013-04-27 DIAGNOSIS — M25512 Pain in left shoulder: Secondary | ICD-10-CM

## 2013-04-27 DIAGNOSIS — S43402A Unspecified sprain of left shoulder joint, initial encounter: Secondary | ICD-10-CM

## 2013-04-27 DIAGNOSIS — S139XXA Sprain of joints and ligaments of unspecified parts of neck, initial encounter: Secondary | ICD-10-CM

## 2013-04-27 DIAGNOSIS — M25519 Pain in unspecified shoulder: Secondary | ICD-10-CM

## 2013-04-27 MED ORDER — IBUPROFEN 600 MG PO TABS: 600.0000 mg | ORAL_TABLET | Freq: Three times a day (TID) | ORAL | Status: DC | PRN

## 2013-04-27 MED ORDER — CYCLOBENZAPRINE HCL 5 MG PO TABS: 5.0000 mg | ORAL_TABLET | Freq: Three times a day (TID) | ORAL | Status: DC | PRN

## 2013-04-27 MED ORDER — KETOROLAC TROMETHAMINE 60 MG/2ML IM SOLN
60.0000 mg | Freq: Once | INTRAMUSCULAR | Status: AC
  Administered 2013-04-27: 60 mg via INTRAMUSCULAR

## 2013-04-27 MED ORDER — HYDROCODONE-ACETAMINOPHEN 5-325 MG PO TABS: 1.0000 | ORAL_TABLET | Freq: Four times a day (QID) | ORAL | Status: DC | PRN

## 2013-04-27 NOTE — Progress Notes (Signed)
[address]   [place], [place]  [address]   [phone]  Subjective:    Patient ID: Kayla Hopkins, female    DOB: 1961/10/11, 52 y.o.   MRN: [id number]  HPI This 52 y.o. female presents for evaluation of arm pain and neck pain.  Took dog to the vet; yanked on arm this occurred yesterday morning.  Onset last night.  Pain with ROM of arm, neck.  Pain anterior chest, under axilla, behind L shoulder, L neck.  Pain radiates into fingers.  Severity 8/10.  Movement makes it worse.  No n/t in arms; feels clammy and nauseated.  No weakness in arms.  No headache.  Took Tramadol one today at 6:00am without improvement.  Heat to area without improvement.  History of neck issues in past; history of neck cramps every three months.  This is worst pain ever in neck.  Unable to move arm L due to severity of pain.   Menopausal.  PCP: Elpidio Anis   Review of Systems  Constitutional: Negative for fever, chills, diaphoresis and fatigue.  Gastrointestinal: Positive for nausea.  Musculoskeletal: Positive for myalgias, back pain and arthralgias. Negative for joint swelling and gait problem.  Skin: Negative for rash.  Neurological: Negative for dizziness, weakness, light-headedness, numbness and headaches.    Past Medical History  Diagnosis Date  . PAF (paroxysmal atrial fibrillation)   . Hypertension   . Peripheral vascular disease 06/11/2009    Bilateral common iliac kissing stents (8X24 mm) . 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 She underwent successful balloon angioplasty.  Restenosis in [facility] in 12/13 s/p  Covered stent  . Hyperlipidemia   . Tobacco abuse   . Claudication   . S/P cardiac cath 2009    Normal    Past Surgical History  Procedure Laterality Date  . Cardiac catheterization      2009  . Back surgery    . Spine surgery      Prior to Admission medications   Medication Sig Start Date End Date Taking?  Authorizing Provider  aspirin EC 81 MG tablet Take 81 mg by mouth every morning.    Yes Historical Provider, MD  atorvastatin (LIPITOR) 40 MG tablet Take 1 tablet (40 mg total) by mouth every evening. 02/03/13  Yes Vesta Mixer, MD  fish oil-omega-3 fatty acids 1000 MG capsule Take 1 g by mouth every morning.    Yes Historical Provider, MD  gabapentin (NEURONTIN) 300 MG capsule Take 1 capsule (300 mg total) by mouth 3 (three) times daily. 03/31/13  Yes Erick Colace, MD  metoprolol succinate (TOPROL-XL) 50 MG 24 hr tablet Take 50 mg by mouth every evening. Take with or immediately following a meal. 07/15/12  Yes Vesta Mixer, MD  traMADol (ULTRAM) 50 MG tablet Take 1 tablet (50 mg total) by mouth every 8 (eight) hours as needed for pain. 03/31/13  Yes Erick Colace, MD  valACYclovir (VALTREX) 500 MG tablet  12/30/12  Yes Historical Provider, MD    Allergies  Allergen Reactions  . Pradaxa (Dabigatran Etexilate Mesylate)     Gastritis   . Simvastatin     Leg pains     History   Social History  . Marital Status: Single    Spouse Name: N/A    Number of Children: N/A  . Years of Education: N/A   Occupational History  . Not on file.   Social History  Main Topics  . Smoking status: Former Smoker    Types: Cigarettes    Quit date: 05/11/2012  . Smokeless tobacco: Never Used     Comment: electronic cigarette  . Alcohol Use: No  . Drug Use: No  . Sexually Active: Yes   Other Topics Concern  . Not on file   Social History Narrative  . No narrative on file    Family History  Problem Relation Age of Onset  . Hypertension Mother   . Heart disease Father   . Heart attack Father   . Hypertension Brother        Objective:   Physical Exam  Nursing note and vitals reviewed. Constitutional: She is oriented to person, place, and time. She appears well-developed and well-nourished. She appears distressed.  Slightly distressed secondary to pain.  HENT:  Head:  Normocephalic and atraumatic.  Eyes: Conjunctivae are normal. Pupils are equal, round, and reactive to light.  Neck: Neck supple. No thyromegaly present.  Cardiovascular: Normal rate, regular rhythm and normal heart sounds.  Exam reveals no gallop and no friction rub.   No murmur heard. Pulses:      Radial pulses are 2+ on the left side.  CAPILLARY REFILL<3 SECONDS.  Pulmonary/Chest: Effort normal and breath sounds normal. She has no wheezes. She has no rales.  Musculoskeletal:       Left shoulder: She exhibits decreased range of motion, tenderness, pain and decreased strength. She exhibits no bony tenderness, no swelling, no effusion, no crepitus, no spasm and normal pulse.       Cervical back: She exhibits decreased range of motion, tenderness, pain and spasm. She exhibits no bony tenderness, no swelling and normal pulse.  CERVICAL SPINE:  +TTP MIDLINE AND PARASPINAL MUSCLES B; +TTP TRAPEZIUS REGION L.  DECREASED ROM THROUGHOUT; +PAIN WITH ALL DIRECTIONS ROM.   L SHOULDER: REFUSED TO MOVE ARM DUE TO PAIN; ABLE TO PASSIVELY ELEVATE ARM TO 90 DEGREES BUT LIMITED BY PAIN.  GRIP 5/5 LUE.  Lymphadenopathy:    She has no cervical adenopathy.  Neurological: She is alert and oriented to person, place, and time. No cranial nerve deficit. She exhibits normal muscle tone. Coordination normal.  Skin: Skin is warm and dry. No rash noted. She is not diaphoretic. No erythema.  Psychiatric: She has a normal mood and affect. Her behavior is normal. Thought content normal.   TORADOL 60MG  IM ADMINISTERED.  [facility] reading (PRIMARY) by  Dr. Katrinka Blazing.  C-SPINE: NARROWING MULTIPLE LEVES; NAD.   L SHOULDER:  NAD       Assessment & Plan:  Neck pain - Plan: DG Shoulder Left, DG Cervical Spine Complete, ketorolac (TORADOL) injection 60 mg, ibuprofen (ADVIL,MOTRIN) 600 MG tablet, cyclobenzaprine (FLEXERIL) 5 MG tablet, HYDROcodone-acetaminophen (NORCO/VICODIN) 5-325 MG per tablet  Pain in joint, shoulder region, left  - Plan: DG Shoulder Left, DG Cervical Spine Complete, ketorolac (TORADOL) injection 60 mg  Sprain, neck, initial encounter  Sprain shoulder/arm, left, initial encounter   1.  Neck pain/strain:  New.  S/p Toradol injection.  Rx for Ibuprofen, Flexeril, Hydrocodone.  Passive ROM; heat to area. 2.  L Shoulder pain/strain:  New.  Rx for Ibuprofen, Flexeril, Hydrocodone.  Passive ROM in upcoming 48 hours.  Call if no improvement in 1 week for ortho referral.  Meds ordered this encounter  Medications  . ketorolac (TORADOL) injection 60 mg    Sig:   . ibuprofen (ADVIL,MOTRIN) 600 MG tablet    Sig: Take 1 tablet (600 mg total) by  mouth every 8 (eight) hours as needed for pain.    Dispense:  40 tablet    Refill:  0  . cyclobenzaprine (FLEXERIL) 5 MG tablet    Sig: Take 1 tablet (5 mg total) by mouth 3 (three) times daily as needed for muscle spasms.    Dispense:  40 tablet    Refill:  0  . HYDROcodone-acetaminophen (NORCO/VICODIN) 5-325 MG per tablet    Sig: Take 1 tablet by mouth every 6 (six) hours as needed for pain.    Dispense:  40 tablet    Refill:  0

## 2013-04-27 NOTE — Patient Instructions (Addendum)
1.  USE HEAT TO AREAS TWICE DAILY FOR 15-20 MINUTES. 2. NO LIFTING WITH L ARM. 3.  CALL IN ONE WEEK IF NO IMPROVEMENT.

## 2013-05-02 ENCOUNTER — Ambulatory Visit: Payer: Medicare Other | Admitting: Cardiovascular Disease

## 2013-05-02 ENCOUNTER — Encounter: Payer: Self-pay | Admitting: Cardiovascular Disease

## 2013-05-02 ENCOUNTER — Ambulatory Visit (INDEPENDENT_AMBULATORY_CARE_PROVIDER_SITE_OTHER): Payer: Medicare Other | Admitting: Cardiovascular Disease

## 2013-05-02 ENCOUNTER — Encounter (HOSPITAL_COMMUNITY): Payer: Self-pay

## 2013-05-02 VITALS — BP 126/78 | HR 80 | Ht 66.0 in | Wt 192.2 lb

## 2013-05-02 DIAGNOSIS — I10 Essential (primary) hypertension: Secondary | ICD-10-CM

## 2013-05-02 DIAGNOSIS — I739 Peripheral vascular disease, unspecified: Secondary | ICD-10-CM

## 2013-05-02 LAB — CBC WITH DIFFERENTIAL/PLATELET
Basophils Relative: 1.1 % (ref 0.0–3.0)
Eosinophils Relative: 2.3 % (ref 0.0–5.0)
HCT: 42.2 % (ref 36.0–46.0)
Hemoglobin: 14.2 g/dL (ref 12.0–15.0)
Lymphs Abs: 2.4 10*3/uL (ref 0.7–4.0)
MCV: 91.5 fl (ref 78.0–100.0)
Monocytes Absolute: 0.5 10*3/uL (ref 0.1–1.0)
Monocytes Relative: 6.8 % (ref 3.0–12.0)
Neutro Abs: 3.7 10*3/uL (ref 1.4–7.7)
Platelets: 220 10*3/uL (ref 150.0–400.0)
RBC: 4.61 Mil/uL (ref 3.87–5.11)
WBC: 6.8 10*3/uL (ref 4.5–10.5)

## 2013-05-02 LAB — BASIC METABOLIC PANEL
BUN: 15 mg/dL (ref 6–23)
Chloride: 105 mEq/L (ref 96–112)
GFR: 95.7 mL/min (ref >60.00)
Potassium: 4.2 mEq/L (ref 3.5–5.1)
Sodium: 138 mEq/L (ref 135–145)

## 2013-05-02 LAB — PROTIME-INR: Prothrombin Time: 10.3 s (ref 10.2–12.4)

## 2013-05-02 MED ORDER — CLOPIDOGREL BISULFATE 75 MG PO TABS: 75.0000 mg | ORAL_TABLET | Freq: Every day | ORAL | Status: AC

## 2013-05-02 NOTE — Patient Instructions (Addendum)
Your physician has requested that you have a peripheral vascular angiogram. This exam is performed at the hospital. During this exam IV contrast is used to look at arterial blood flow. Please review the information sheet given for details.  Your physician has recommended you make the following change in your medication: START Plavix 75mg  take one by mouth daily

## 2013-05-02 NOTE — Progress Notes (Signed)
HPI  Kayla Hopkins is a 52 year old female who is here today for a followup visit.  Kayla Hopkins has no known CAD per cath back in 2009. Kayla Hopkins does have known history of peripheral arterial disease, paroxysmal atrial fibrillation, hypertension and hyperlipidemia. Kayla Hopkins was seen in July,2013 for significant right lower extremity claudication with significant drop in ABI. In 2010, Kayla Hopkins underwent angiography by Dr. Myra Gianotti which showed significant bilateral ostial common iliac disease. Kayla Hopkins underwent bilateral kissing stent placement with an 8 x 24 mm Genesis stent.  Kayla Hopkins underwent angiography by me in July,2013 which confirmed severe 90% in-stent restenosis in the right common iliac artery. Kayla Hopkins had successful balloon angioplasty without significant residual stenosis. Her claudication resolved completely after the procedure but devolped recurrent claudication. Duplex ultrasound showed recurrent in-stent restenosis on the right side. Kayla Hopkins underwent angiography in 10/2012 which confirmed this and thus I decided to place a covered stent which was done successfully. There was moderate stenosis in distal left common iliac artery at that time.  Kayla Hopkins returns with severe left hip and thigh claudication even after just walking to the mailbox. Recent ABI was normal but significantly dropped on the left to 0.61 with evidence of severe stenosis in distal left common iliac artery.   Allergies  Allergen Reactions  . Pradaxa (Dabigatran Etexilate Mesylate)     Gastritis   . Simvastatin     Leg pains      Current Outpatient Prescriptions on File Prior to Visit  Medication Sig Dispense Refill  . aspirin EC 81 MG tablet Take 81 mg by mouth every morning.       [name] atorvastatin (LIPITOR) 40 MG tablet Take 1 tablet (40 mg total) by mouth every evening.  30 tablet  3  . cyclobenzaprine (FLEXERIL) 5 MG tablet Take 1 tablet (5 mg total) by mouth 3 (three) times daily as needed for muscle spasms.  40 tablet  0  . fish oil-omega-3 fatty  acids 1000 MG capsule Take 1 g by mouth every morning.       . gabapentin (NEURONTIN) 300 MG capsule Take 1 capsule (300 mg total) by mouth 3 (three) times daily.  90 capsule  2  . HYDROcodone-acetaminophen (NORCO/VICODIN) 5-325 MG per tablet Take 1 tablet by mouth every 6 (six) hours as needed for pain.  40 tablet  0  . ibuprofen (ADVIL,MOTRIN) 600 MG tablet Take 1 tablet (600 mg total) by mouth every 8 (eight) hours as needed for pain.  40 tablet  0  . metoprolol succinate (TOPROL-XL) 50 MG 24 hr tablet Take 50 mg by mouth every evening. Take with or immediately following a meal.      . traMADol (ULTRAM) 50 MG tablet Take 1 tablet (50 mg total) by mouth every 8 (eight) hours as needed for pain.  90 tablet  2  . valACYclovir (VALTREX) 500 MG tablet        No current facility-administered medications on file prior to visit.     Past Medical History  Diagnosis Date  . PAF (paroxysmal atrial fibrillation)   . Hypertension   . Peripheral vascular disease 06/11/2009    Bilateral common iliac kissing stents (8X24 mm) . 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 Kayla Hopkins underwent successful balloon angioplasty.  Restenosis in [facility] in 12/13 s/p  Covered stent  . Hyperlipidemia   . Tobacco abuse   . Claudication   . S/P cardiac cath 2009  Normal     Past Surgical History  Procedure Laterality Date  . Cardiac catheterization      2009  . Back surgery    . Spine surgery       Family History  Problem Relation Age of Onset  . Hypertension Mother   . Heart disease Father   . Heart attack Father   . Hypertension Brother      History   Social History  . Marital Status: Single    Spouse Name: N/A    Number of Children: N/A  . Years of Education: N/A   Occupational History  . Not on file.   Social History Main Topics  . Smoking status: Former Smoker    Types: Cigarettes    Quit date: 05/11/2012  . Smokeless  tobacco: Never Used     Comment: electronic cigarette  . Alcohol Use: No  . Drug Use: No  . Sexually Active: Yes   Other Topics Concern  . Not on file   Social History Narrative  . No narrative on file       PHYSICAL EXAM   BP 126/78  Pulse 80  Ht 5\' 6"  (1.676 m)  Wt 192 lb 3.2 oz (87.181 kg)  BMI 31.04 kg/m2 Constitutional: Kayla Hopkins is oriented to person, place, and time. Kayla Hopkins appears well-developed and well-nourished. No distress.  HENT: No nasal discharge.  Head: Normocephalic and atraumatic.  Eyes: Pupils are equal and round. Right eye exhibits no discharge. Left eye exhibits no discharge.  Neck: Normal range of motion. Neck supple. No JVD present. No thyromegaly present.  Cardiovascular: Normal rate, regular rhythm, normal heart sounds. Exam reveals no gallop and no friction rub. No murmur heard.  Pulmonary/Chest: Effort normal and breath sounds normal. No stridor. No respiratory distress. Kayla Hopkins has no wheezes. Kayla Hopkins has no rales. Kayla Hopkins exhibits no tenderness.  Abdominal: Soft. Bowel sounds are normal. Kayla Hopkins exhibits no distension. There is no tenderness. There is no rebound and no guarding.  Musculoskeletal: Normal range of motion. Kayla Hopkins exhibits no edema and no tenderness.  Neurological: Kayla Hopkins is alert and oriented to person, place, and time. Coordination normal.  Skin: Skin is warm and dry. No rash noted. Kayla Hopkins is not diaphoretic. No erythema. No pallor.  Psychiatric: Kayla Hopkins has a normal mood and affect. Her behavior is normal. Judgment and thought content normal.  Vascular: Femoral pulses: diminished on the right and absent on the left   ASSESSMENT AND PLAN

## 2013-05-02 NOTE — Assessment & Plan Note (Signed)
Severe lifestyle limiting claudication involving left leg due to significant progression of distal left common iliac artery stenosis beyond the stented area.  ABI is normal on the right and dropped to 0.61 on the left.  I recommend preceding with angiography and possible stenting of left iliac artery.  Start Plavix 75 mg once daily.

## 2013-05-10 ENCOUNTER — Ambulatory Visit (HOSPITAL_COMMUNITY)
Admission: RE | Admit: 2013-05-10 | Discharge: 2013-05-10 | Disposition: A | Discharge status: Home / Self Care | Payer: Medicare Other | Source: Ambulatory Visit | Attending: Cardiovascular Disease | Admitting: Cardiovascular Disease

## 2013-05-10 ENCOUNTER — Encounter (HOSPITAL_COMMUNITY)
Admission: RE | Disposition: A | Discharge status: Home / Self Care | Payer: Self-pay | Source: Ambulatory Visit | Attending: Cardiovascular Disease

## 2013-05-10 DIAGNOSIS — E785 Hyperlipidemia, unspecified: Secondary | ICD-10-CM | POA: Insufficient documentation

## 2013-05-10 DIAGNOSIS — Z7982 Long term (current) use of aspirin: Secondary | ICD-10-CM | POA: Insufficient documentation

## 2013-05-10 DIAGNOSIS — Z888 Allergy status to other drugs, medicaments and biological substances status: Secondary | ICD-10-CM | POA: Insufficient documentation

## 2013-05-10 DIAGNOSIS — I10 Essential (primary) hypertension: Secondary | ICD-10-CM | POA: Insufficient documentation

## 2013-05-10 DIAGNOSIS — I70219 Atherosclerosis of native arteries of extremities with intermittent claudication, unspecified extremity: Secondary | ICD-10-CM

## 2013-05-10 DIAGNOSIS — Z87891 Personal history of nicotine dependence: Secondary | ICD-10-CM | POA: Insufficient documentation

## 2013-05-10 DIAGNOSIS — I4891 Unspecified atrial fibrillation: Secondary | ICD-10-CM | POA: Insufficient documentation

## 2013-05-10 DIAGNOSIS — Z79899 Other long term (current) drug therapy: Secondary | ICD-10-CM | POA: Insufficient documentation

## 2013-05-10 HISTORY — PX: ABDOMINAL AORTAGRAM: SHX5454

## 2013-05-10 SURGERY — ABDOMINAL AORTAGRAM
Anesthesia: LOCAL

## 2013-05-10 MED ORDER — LIDOCAINE HCL (PF) 1 % IJ SOLN
INTRAMUSCULAR | Status: AC
  Filled 2013-05-10: qty 30

## 2013-05-10 MED ORDER — MORPHINE SULFATE 2 MG/ML IJ SOLN
2.0000 mg | INTRAMUSCULAR | Status: DC | PRN
  Administered 2013-05-10: 2 mg via INTRAVENOUS

## 2013-05-10 MED ORDER — SODIUM CHLORIDE 0.9 % IV SOLN: 250.0000 mL | INTRAVENOUS | Status: DC | PRN

## 2013-05-10 MED ORDER — HEPARIN SODIUM (PORCINE) 1000 UNIT/ML IJ SOLN
INTRAMUSCULAR | Status: AC
  Filled 2013-05-10: qty 1

## 2013-05-10 MED ORDER — DIAZEPAM 5 MG PO TABS
5.0000 mg | ORAL_TABLET | ORAL | Status: AC
  Administered 2013-05-10: 5 mg via ORAL

## 2013-05-10 MED ORDER — FENTANYL CITRATE 0.05 MG/ML IJ SOLN
INTRAMUSCULAR | Status: AC
  Filled 2013-05-10: qty 2

## 2013-05-10 MED ORDER — SODIUM CHLORIDE 0.9 % IV SOLN: INTRAVENOUS | Status: DC

## 2013-05-10 MED ORDER — MIDAZOLAM HCL 2 MG/2ML IJ SOLN
INTRAMUSCULAR | Status: AC
  Filled 2013-05-10: qty 2

## 2013-05-10 MED ORDER — ONDANSETRON HCL 4 MG/2ML IJ SOLN
4.0000 mg | Freq: Four times a day (QID) | INTRAMUSCULAR | Status: DC | PRN
  Administered 2013-05-10: 4 mg via INTRAVENOUS

## 2013-05-10 MED ORDER — SODIUM CHLORIDE 0.9 % IJ SOLN: 3.0000 mL | INTRAMUSCULAR | Status: DC | PRN

## 2013-05-10 MED ORDER — ASPIRIN 81 MG PO CHEW
CHEWABLE_TABLET | ORAL | Status: AC
  Filled 2013-05-10: qty 4

## 2013-05-10 MED ORDER — ASPIRIN 81 MG PO CHEW
324.0000 mg | CHEWABLE_TABLET | ORAL | Status: AC
  Administered 2013-05-10: 324 mg via ORAL

## 2013-05-10 MED ORDER — SODIUM CHLORIDE 0.9 % IJ SOLN: 3.0000 mL | Freq: Two times a day (BID) | INTRAMUSCULAR | Status: DC

## 2013-05-10 MED ORDER — DIAZEPAM 5 MG PO TABS
ORAL_TABLET | ORAL | Status: AC
  Filled 2013-05-10: qty 1

## 2013-05-10 MED ORDER — ACETAMINOPHEN 325 MG PO TABS
ORAL_TABLET | ORAL | Status: AC
  Administered 2013-05-10: 650 mg via ORAL
  Filled 2013-05-10: qty 2

## 2013-05-10 MED ORDER — ACETAMINOPHEN 325 MG PO TABS: 650.0000 mg | ORAL_TABLET | ORAL | Status: DC | PRN

## 2013-05-10 MED ORDER — HYDRALAZINE HCL 20 MG/ML IJ SOLN
INTRAMUSCULAR | Status: AC
  Filled 2013-05-10: qty 1

## 2013-05-10 NOTE — Interval H&P Note (Signed)
History and Physical Interval Note:  05/10/2013 11:35 AM  Kayla Hopkins  has presented today for surgery, with the diagnosis of pvd  The various methods of treatment have been discussed with the patient and family. After consideration of risks, benefits and other options for treatment, the patient has consented to  Procedure(s): ABDOMINAL AORTAGRAM (N/A) as a surgical intervention .  The patient's history has been reviewed, patient examined, no change in status, stable for surgery.  I have reviewed the patient's chart and labs.  Questions were answered to the patient's satisfaction.     [name]

## 2013-05-10 NOTE — Progress Notes (Signed)
States h/a decreased to 5/10 and states no nausea

## 2013-05-10 NOTE — H&P (View-Only) (Signed)
HPI  Kayla Hopkins is a 52 year old female who is here today for a followup visit.  She has no known CAD per cath back in 2009. She does have known history of peripheral arterial disease, paroxysmal atrial fibrillation, hypertension and hyperlipidemia. She was seen in July,2013 for significant right lower extremity claudication with significant drop in ABI. In 2010, she underwent angiography by Dr. Myra Gianotti which showed significant bilateral ostial common iliac disease. She underwent bilateral kissing stent placement with an 8 x 24 mm Genesis stent.  She underwent angiography by me in July,2013 which confirmed severe 90% in-stent restenosis in the right common iliac artery. She had successful balloon angioplasty without significant residual stenosis. Her claudication resolved completely after the procedure but devolped recurrent claudication. Duplex ultrasound showed recurrent in-stent restenosis on the right side. She underwent angiography in 10/2012 which confirmed this and thus I decided to place a covered stent which was done successfully. There was moderate stenosis in distal left common iliac artery at that time.  She returns with severe left hip and thigh claudication even after just walking to the mailbox. Recent ABI was normal but significantly dropped on the left to 0.61 with evidence of severe stenosis in distal left common iliac artery.   Allergies  Allergen Reactions  . Pradaxa (Dabigatran Etexilate Mesylate)     Gastritis   . Simvastatin     Leg pains      Current Outpatient Prescriptions on File Prior to Visit  Medication Sig Dispense Refill  . aspirin EC 81 MG tablet Take 81 mg by mouth every morning.       [name] atorvastatin (LIPITOR) 40 MG tablet Take 1 tablet (40 mg total) by mouth every evening.  30 tablet  3  . cyclobenzaprine (FLEXERIL) 5 MG tablet Take 1 tablet (5 mg total) by mouth 3 (three) times daily as needed for muscle spasms.  40 tablet  0  . fish oil-omega-3 fatty  acids 1000 MG capsule Take 1 g by mouth every morning.       . gabapentin (NEURONTIN) 300 MG capsule Take 1 capsule (300 mg total) by mouth 3 (three) times daily.  90 capsule  2  . HYDROcodone-acetaminophen (NORCO/VICODIN) 5-325 MG per tablet Take 1 tablet by mouth every 6 (six) hours as needed for pain.  40 tablet  0  . ibuprofen (ADVIL,MOTRIN) 600 MG tablet Take 1 tablet (600 mg total) by mouth every 8 (eight) hours as needed for pain.  40 tablet  0  . metoprolol succinate (TOPROL-XL) 50 MG 24 hr tablet Take 50 mg by mouth every evening. Take with or immediately following a meal.      . traMADol (ULTRAM) 50 MG tablet Take 1 tablet (50 mg total) by mouth every 8 (eight) hours as needed for pain.  90 tablet  2  . valACYclovir (VALTREX) 500 MG tablet        No current facility-administered medications on file prior to visit.     Past Medical History  Diagnosis Date  . PAF (paroxysmal atrial fibrillation)   . Hypertension   . Peripheral vascular disease 06/11/2009    Bilateral common iliac kissing stents (8X24 mm) . 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 She underwent successful balloon angioplasty.  Restenosis in [facility] in 12/13 s/p  Covered stent  . Hyperlipidemia   . Tobacco abuse   . Claudication   . S/P cardiac cath 2009  Normal     Past Surgical History  Procedure Laterality Date  . Cardiac catheterization      2009  . Back surgery    . Spine surgery       Family History  Problem Relation Age of Onset  . Hypertension Mother   . Heart disease Father   . Heart attack Father   . Hypertension Brother      History   Social History  . Marital Status: Single    Spouse Name: N/A    Number of Children: N/A  . Years of Education: N/A   Occupational History  . Not on file.   Social History Main Topics  . Smoking status: Former Smoker    Types: Cigarettes    Quit date: 05/11/2012  . Smokeless  tobacco: Never Used     Comment: electronic cigarette  . Alcohol Use: No  . Drug Use: No  . Sexually Active: Yes   Other Topics Concern  . Not on file   Social History Narrative  . No narrative on file       PHYSICAL EXAM   BP 126/78  Pulse 80  Ht 5\' 6"  (1.676 m)  Wt 192 lb 3.2 oz (87.181 kg)  BMI 31.04 kg/m2 Constitutional: She is oriented to person, place, and time. She appears well-developed and well-nourished. No distress.  HENT: No nasal discharge.  Head: Normocephalic and atraumatic.  Eyes: Pupils are equal and round. Right eye exhibits no discharge. Left eye exhibits no discharge.  Neck: Normal range of motion. Neck supple. No JVD present. No thyromegaly present.  Cardiovascular: Normal rate, regular rhythm, normal heart sounds. Exam reveals no gallop and no friction rub. No murmur heard.  Pulmonary/Chest: Effort normal and breath sounds normal. No stridor. No respiratory distress. She has no wheezes. She has no rales. She exhibits no tenderness.  Abdominal: Soft. Bowel sounds are normal. She exhibits no distension. There is no tenderness. There is no rebound and no guarding.  Musculoskeletal: Normal range of motion. She exhibits no edema and no tenderness.  Neurological: She is alert and oriented to person, place, and time. Coordination normal.  Skin: Skin is warm and dry. No rash noted. She is not diaphoretic. No erythema. No pallor.  Psychiatric: She has a normal mood and affect. Her behavior is normal. Judgment and thought content normal.  Vascular: Femoral pulses: diminished on the right and absent on the left   ASSESSMENT AND PLAN

## 2013-05-10 NOTE — CV Procedure (Signed)
PERIPHERAL VASCULAR PROCEDURE  NAME:  Kayla Hopkins   MRN: [id number] DOB:  12-Jan-1961   ADMIT DATE: 05/10/2013  Performing Cardiologist: [name] Primary Physician: Windy Carina, PA-C   Procedures Performed:  Ultrasound-guided arterial access.  Abdominal Aortic Angiogram with Bi-Iliofemoral Runoff.   Selective left iliac artery angiography.  Self-expanding stent placement to the distal left common iliac artery.  Mynx closure device.   Indication(s):   Lifestyle limiting Claudication with significant drop in ABI over the last 6 months.    Consent: The procedure with Risks/Benefits/Alternatives and Indications was reviewed with the patient .  All questions were answered.  Medications:  Sedation:  2 mg IV Versed, 100 mcg IV Fentanyl  Contrast:  147 ML  Visipaque   Procedural details: The left  groanin was prepped, draped, and anesthetized with 1% lidocaine. there was no palpable pulse.thus, ultrasound was used to visualize the left common femoral artery.   Using modified Seldinger technique, a  a micropuncture needle and sheath was used. This was then exchanged into a 5 [place] sheath.   A 5 Fr Short Pigtail Catheter was advanced of over a  Versicore wire into the descending Aorta to a level just above the renal arteries. A power injection of 25ml/sec contrast over 1 sec was performed for Abdominal Aortic Angiography.  The catheter was then pulled back to a level just above the Aortic bifurcation, and a second power injection was performed to evaluate the iliac arteries.   Bilateral lower extremity arterial run off was then performed via power injection of  7 ml / sec contrast for a total of  77 ml.  The pigtail catheter was then removed over a wire. I then advanced and endhole straight tip catheter to check gradient across the lesion in the distal left common iliac artery. The systolic gradient was noted to be above 40 mm mercury which correlated with Doppler  findings.    Interventional Procedure:   The sheath was exchanged into a 6 French bright tip sheath. The lesion was crossed with the Versicore wire. The lesion was at the origin of the internal iliac artery which had to be jailed. 5000 units of unfractionated heparin was given with an ACT of 210. She has already been on aspirin and Plavix. An 8 x 30 mm Smart self-expanding stent was placed and was post dilated with a 7 x 20 mm balloon with excellent results. There was normal flow in the internal iliac. The sheath was exchanged into a short 6 French sheath and the site was closed with a Mynx device.  Hemodynamics:  Central Aortic Pressure / Mean Aortic Pressure:  170/86  Findings:  Abdominal aorta:  normal in size with no evidence of aneurysm. There is mild diffuse disease distally.  Left renal artery:  normal  Right renal artery:  mild 20% proximal disease.  Celiac artery:  patent.  Superior mesenteric artery:  patent.  Right common iliac artery:  a stent is noted at the ostium which is patent with minor restenosis. Distal to the stent, there is a small pseudoaneurysm which has not changed in size since the most recent angiography.  Right internal iliac artery:  patent with 40% ostial disease.  Right external iliac artery:  minor irregularities.  Right common femoral artery:  normal.  Right profunda femoral artery:  normal  Right superficial femoral artery:  normal  Right popliteal artery:  Normal  Three-vessel runoff below the knee although the anterior tibial is not  well-visualized.  Left common iliac artery:   Patent stents with minor restenosis. Distal to the stent, there is 20% stenosis. At the distal segment, there is a discrete 70% stenosis with greater than 40 mm systolic gradient by pullback.  Left internal iliac artery:  patent with minor irregularities.  Left external iliac artery:  normal  Left common femoral artery:  minor irregularities.  Left profunda  femoral artery:  normal  Left superficial femoral artery:   Normal  Left popliteal artery:  Normal  Three-vessel runoff below the knee.  Conclusions:  1. Patent ostial common iliac artery stents with minor restenosis. 2. Significant stenosis in the distal left common iliac artery just proximal to the origin of the internal iliac artery. 3. No significant infrainguinal disease. 4. Successful self-expanding stent placement to the left common iliac artery. Post-stent gradient was 9 mm systolic.  Recommendations:   Continue aggressive medical therapy, smoking cessation and dual antiplatelet therapy.   [name], MD, [facility] 05/10/2013 12:41 PM

## 2013-05-18 ENCOUNTER — Encounter (INDEPENDENT_AMBULATORY_CARE_PROVIDER_SITE_OTHER): Payer: Medicare Other

## 2013-05-18 DIAGNOSIS — I739 Peripheral vascular disease, unspecified: Secondary | ICD-10-CM

## 2013-05-30 ENCOUNTER — Encounter: Payer: Self-pay | Admitting: Cardiovascular Disease

## 2013-05-30 ENCOUNTER — Other Ambulatory Visit: Payer: Self-pay | Admitting: Cardiovascular Disease

## 2013-05-30 ENCOUNTER — Ambulatory Visit (INDEPENDENT_AMBULATORY_CARE_PROVIDER_SITE_OTHER): Payer: Medicare Other | Admitting: Cardiovascular Disease

## 2013-05-30 VITALS — BP 116/90 | HR 59 | Ht 66.0 in | Wt 186.0 lb

## 2013-05-30 DIAGNOSIS — I739 Peripheral vascular disease, unspecified: Secondary | ICD-10-CM

## 2013-05-30 NOTE — Progress Notes (Signed)
HPI  Kayla Hopkins is a 52 year old female who is here today for a followup visit.  She has no known CAD per cath back in 2009. She does have known history of peripheral arterial disease, paroxysmal atrial fibrillation, hypertension and hyperlipidemia.  In 2010, she underwent angiography by Dr. Myra Gianotti which showed significant bilateral ostial common iliac disease. She underwent bilateral kissing stent placement with an 8 x 24 mm Genesis stent.  She underwent angiography by me in July,2013 for recurrent claudication which confirmed severe 90% in-stent restenosis in the right common iliac artery. She had successful balloon angioplasty without significant residual stenosis. Her claudication resolved completely after the procedure but devolped recurrent claudication. Duplex ultrasound showed recurrent in-stent restenosis on the right side. She underwent angiography in 10/2012 which confirmed this and thus I decided to place a covered stent which was done successfully. There was moderate stenosis in distal left common iliac artery at that time.  She was seen recently for severe left hip and thigh claudication even after just walking to the mailbox.  ABI significantly dropped on the left to 0.61 with evidence of severe stenosis in distal left common iliac artery.  She underwent angiography which showed 70% stenosis in the distal left common iliac artery just before the origin of the internal iliac artery. There was greater than 40 mm systolic gradient across the lesion. Thus, this was treated with a self-expanding stent placement without complications. Since then, she reports resolution of her claudication and has been able to get off pain medications. She is able to walk without limitations. She has not smoked in the last year. Postprocedure ABI was 0.9.  Allergies  Allergen Reactions  . Pradaxa (Dabigatran Etexilate Mesylate)     Gastritis   . Simvastatin     Leg pains      Current Outpatient  Prescriptions on File Prior to Visit  Medication Sig Dispense Refill  . aspirin EC 81 MG tablet Take 81 mg by mouth every morning.       [name] atorvastatin (LIPITOR) 40 MG tablet Take 1 tablet (40 mg total) by mouth every evening.  30 tablet  3  . clopidogrel (PLAVIX) 75 MG tablet Take 1 tablet (75 mg total) by mouth daily.  30 tablet  11  . cyclobenzaprine (FLEXERIL) 5 MG tablet Take 1 tablet (5 mg total) by mouth 3 (three) times daily as needed for muscle spasms.  40 tablet  0  . fish oil-omega-3 fatty acids 1000 MG capsule Take 1 g by mouth every morning.       . gabapentin (NEURONTIN) 300 MG capsule Take 1 capsule (300 mg total) by mouth 3 (three) times daily.  90 capsule  2  . metoprolol succinate (TOPROL-XL) 50 MG 24 hr tablet Take 50 mg by mouth every evening. Take with or immediately following a meal.      . traMADol (ULTRAM) 50 MG tablet Take 1 tablet (50 mg total) by mouth every 8 (eight) hours as needed for pain.  90 tablet  2  . valACYclovir (VALTREX) 500 MG tablet Take 500 mg by mouth daily.        No current facility-administered medications on file prior to visit.     Past Medical History  Diagnosis Date  . PAF (paroxysmal atrial fibrillation)   . Hypertension   . Peripheral vascular disease 06/11/2009    Bilateral common iliac kissing stents (8X24 mm) . Repeat angiography in July of 2013 showed severe in-stent restenosis in the right  common iliac artery stent. She underwent successful balloon angioplasty.  Restenosis in [facility] in 12/13 s/p  Covered stent  . Hyperlipidemia   . Tobacco abuse   . Claudication   . S/P cardiac cath 2009    Normal     Past Surgical History  Procedure Laterality Date  . Cardiac catheterization      2009  . Back surgery    . Spine surgery       Family History  Problem Relation Age of Onset  . Hypertension Mother   . Heart disease Father   . Heart attack Father   . Hypertension Brother      History   Social History  . Marital  Status: Single    Spouse Name: N/A    Number of Children: N/A  . Years of Education: N/A   Occupational History  . Not on file.   Social History Main Topics  . Smoking status: Former Smoker    Types: Cigarettes    Quit date: 05/11/2012  . Smokeless tobacco: Never Used     Comment: electronic cigarette  . Alcohol Use: No  . Drug Use: No  . Sexually Active: Yes   Other Topics Concern  . Not on file   Social History Narrative  . No narrative on file       PHYSICAL EXAM   BP 116/90  Pulse 59  Ht 5\' 6"  (1.676 m)  Wt 186 lb (84.369 kg)  BMI 30.04 kg/m2 Constitutional: She is oriented to person, place, and time. She appears well-developed and well-nourished. No distress.  HENT: No nasal discharge.  Head: Normocephalic and atraumatic.  Eyes: Pupils are equal and round. Right eye exhibits no discharge. Left eye exhibits no discharge.  Neck: Normal range of motion. Neck supple. No JVD present. No thyromegaly present.  Cardiovascular: Normal rate, regular rhythm, normal heart sounds. Exam reveals no gallop and no friction rub. No murmur heard.  Pulmonary/Chest: Effort normal and breath sounds normal. No stridor. No respiratory distress. She has no wheezes. She has no rales. She exhibits no tenderness.  Abdominal: Soft. Bowel sounds are normal. She exhibits no distension. There is no tenderness. There is no rebound and no guarding.  Musculoskeletal: Normal range of motion. She exhibits no edema and no tenderness.  Neurological: She is alert and oriented to person, place, and time. Coordination normal.  Skin: Skin is warm and dry. No rash noted. She is not diaphoretic. No erythema. No pallor.  Psychiatric: She has a normal mood and affect. Her behavior is normal. Judgment and thought content normal.  Vascular: Femoral pulses: diminished on both sides. Distal pulses are faint.   ASSESSMENT AND PLAN

## 2013-05-30 NOTE — Patient Instructions (Addendum)
Your physician has requested that you have an aorto-iliac duplex in 3 MONTHS. During this test, an ultrasound is used to evaluate the aorta and iliac arteries. Do not eat after midnight the day before and avoid carbonated beverages  Your physician wants you to follow-up in: 6 MONTHS with Dr Kirke Corin.  You will receive a reminder letter in the mail two months in advance. If you don't receive a letter, please call our office to schedule the follow-up appointment.  Your physician recommends that you continue on your current medications as directed. Please refer to the Current Medication list given to you today.

## 2013-05-30 NOTE — Assessment & Plan Note (Signed)
He had complete resolution of claudication after recent stenting of the distal left common iliac artery. Continue dual antiplatelet therapy long-term due to her multiple peripheral stents including a covered stent on the right side. Obtain aortoiliac duplex in 3 months from now.

## 2013-06-30 ENCOUNTER — Other Ambulatory Visit: Payer: Self-pay | Admitting: Cardiovascular Disease

## 2013-08-07 ENCOUNTER — Other Ambulatory Visit: Payer: Self-pay | Admitting: Cardiovascular Disease

## 2013-08-30 ENCOUNTER — Other Ambulatory Visit: Payer: Self-pay | Admitting: Cardiovascular Disease

## 2013-09-01 ENCOUNTER — Ambulatory Visit (INDEPENDENT_AMBULATORY_CARE_PROVIDER_SITE_OTHER): Payer: Medicare Other | Admitting: Internal Medicine

## 2013-09-01 ENCOUNTER — Ambulatory Visit
Admission: RE | Admit: 2013-09-01 | Discharge: 2013-09-01 | Disposition: A | Discharge status: Home / Self Care | Payer: Medicare Other | Source: Ambulatory Visit | Attending: Internal Medicine | Admitting: Internal Medicine

## 2013-09-01 VITALS — BP 139/90 | HR 65 | Temp 97.8°F | Resp 16 | Ht 65.0 in | Wt 189.0 lb

## 2013-09-01 DIAGNOSIS — R319 Hematuria, unspecified: Secondary | ICD-10-CM

## 2013-09-01 DIAGNOSIS — R1011 Right upper quadrant pain: Secondary | ICD-10-CM

## 2013-09-01 DIAGNOSIS — R32 Unspecified urinary incontinence: Secondary | ICD-10-CM

## 2013-09-01 DIAGNOSIS — R109 Unspecified abdominal pain: Secondary | ICD-10-CM

## 2013-09-01 DIAGNOSIS — M961 Postlaminectomy syndrome, not elsewhere classified: Secondary | ICD-10-CM

## 2013-09-01 DIAGNOSIS — IMO0002 Reserved for concepts with insufficient information to code with codable children: Secondary | ICD-10-CM

## 2013-09-01 DIAGNOSIS — M5416 Radiculopathy, lumbar region: Secondary | ICD-10-CM

## 2013-09-01 DIAGNOSIS — M549 Dorsalgia, unspecified: Secondary | ICD-10-CM

## 2013-09-01 DIAGNOSIS — R112 Nausea with vomiting, unspecified: Secondary | ICD-10-CM

## 2013-09-01 LAB — POCT CBC
Hemoglobin: 15.8 g/dL (ref 12.2–16.2)
MCHC: 32.3 g/dL (ref 31.8–35.4)
MPV: 9.6 fL (ref 0–99.8)
POC Granulocyte: 5.7 (ref 2–6.9)
POC MID %: 5.9 %M (ref 0–12)
Platelet Count, POC: 277 10*3/uL (ref 142–424)
RBC: 5.16 M/uL (ref 4.04–5.48)

## 2013-09-01 LAB — COMPREHENSIVE METABOLIC PANEL
ALT: 21 U/L (ref 0–35)
AST: 17 U/L (ref 0–37)
CO2: 26 mEq/L (ref 19–32)
Chloride: 102 mEq/L (ref 96–112)
Sodium: 135 mEq/L (ref 135–145)
Total Bilirubin: 0.4 mg/dL (ref 0.3–1.2)
Total Protein: 7.4 g/dL (ref 6.0–8.3)

## 2013-09-01 LAB — POCT UA - MICROSCOPIC ONLY: Crystals, Ur, HPF, POC: NEGATIVE

## 2013-09-01 LAB — POCT URINALYSIS DIPSTICK
Glucose, UA: NEGATIVE
Spec Grav, UA: >=1.03

## 2013-09-01 MED ORDER — KETOROLAC TROMETHAMINE 60 MG/2ML IM SOLN
60.0000 mg | Freq: Once | INTRAMUSCULAR | Status: AC
  Administered 2013-09-01: 60 mg via INTRAMUSCULAR

## 2013-09-01 NOTE — Patient Instructions (Signed)
You may go now for the CT scan at [facility] [address] [place]   Driving directions to [address] [place], [place], [place] [address] [id number]  - more info    [address]  [place], [place] [address]     1. Head south on [address] toward [address]      0.5 mi    2. Sharp left onto [address]      0.6 mi    3. Turn left onto the [address] ramp      0.2 mi    4. Merge onto [address]      3.0 mi    5. Continue straight to stay on [address]      0.4 mi    6. Slight left to stay on [facility] [address]  Destination will be on the right     1.0 mi     [address] [place], [place] [address]

## 2013-09-01 NOTE — Progress Notes (Signed)
This chart was scribed for Kayla Earls, MD by [name], ED Scribe. This patient was seen in room Room 14 and the patient's care was started at 11:09 AM  Subjective:    Patient ID: Kayla Hopkins, female    DOB: [birth year], 52 y.o.   MRN: [id number]  HPI Kayla Hopkins is a 52 y.o. female who presents to the [facility] complaining of ongoing back pain that radiates to abd region. Pt had spinal stenosis surgery 2 years ago. Pt states pain starts in the R side of the lower back and radiates to abd regions. Worsens when she eats.She states she had an episode of emesis last night once she ate supper due to pain. Pt states she has had frequency episodes within the last 3 days. Pt states when she stands at times, she just urinates on herself. Pt states she at times feels she is done urinating but when she stands, she continues to urinate on herself.  She states when pain begins, she begins to have nausea. She describes pain as if she is "being stabbed." Pt denies ever having nephrolithiasis. Pt denies hx of GERD. Pt denies pain with rotation of body. Pt denies fever, SOB, and cough.  This problem has been recurrent over the last year in episodes.  Review of Systems  Constitutional: Positive for appetite change. Negative for fever, fatigue and unexpected weight change.  Respiratory: Negative for cough and shortness of breath.   Cardiovascular: Negative for chest pain.  Genitourinary: Positive for frequency.       Has had episodes of stress incontinence on and off for 6-12 months and recently over the last 3 days.  Musculoskeletal: Positive for back pain (that radiates to abd region).   Patient Active Problem List   Diagnosis Date Noted  . Left lumbar radiculitis 01/24/2013  . Postlaminectomy syndrome, lumbar region 01/24/2013  . Peripheral vascular disease   . Chest pain 04/08/2012  . HTN (hypertension) 06/26/2011  . Palpitations 06/26/2011  . Hyperlipidemia 06/26/2011  . Back  pain 06/26/2011  Current outpatient prescriptions:aspirin EC 81 MG tablet, Take 81 mg by mouth every morning. , Disp: , Rfl: ;  atorvastatin (LIPITOR) 40 MG tablet, TAKE ONE TABLET BY MOUTH ONCE DAILY IN THE EVENING **NEEDS  APPOINTMENT  IN  3  MONTHS  [id number]**, Disp: 30 tablet, Rfl: 0;  clopidogrel (PLAVIX) 75 MG tablet, Take 1 tablet (75 mg total) by mouth daily., Disp: 30 tablet, Rfl: 11 fish oil-omega-3 fatty acids 1000 MG capsule, Take 1 g by mouth every morning. , Disp: , Rfl: ;  gabapentin (NEURONTIN) 300 MG capsule, Take 1 capsule (300 mg total) by mouth 3 (three) times daily., Disp: 90 capsule, Rfl: 2;  metoprolol succinate (TOPROL-XL) 50 MG 24 hr tablet, Take 50 mg by mouth every evening. Take with or immediately following a meal., Disp: , Rfl:  cyclobenzaprine (FLEXERIL) 5 MG tablet, Take 1 tablet (5 mg total) by mouth 3 (three) times daily as needed for muscle spasms., Disp: 40 tablet, Rfl: 0;  traMADol (ULTRAM) 50 MG tablet, Take 1 tablet (50 mg total) by mouth every 8 (eight) hours as needed for pain., Disp: 90 tablet, Rfl: 2;  valACYclovir (VALTREX) 500 MG tablet, Take 500 mg by mouth daily. , Disp: , Rfl:       Objective:   Physical Exam  Constitutional: She is oriented to person, place, and time.  Overweight  Eyes: EOM are normal. Pupils are equal, round, and reactive to light.  Cardiovascular: Normal  rate, regular rhythm and normal heart sounds.   No murmur heard. Pulmonary/Chest: Effort normal and breath sounds normal.  Abdominal: Soft. Bowel sounds are normal. There is tenderness (RUQ and RLQ. Tenderness to palpation and percussion). There is no rebound and no guarding.  Musculoskeletal:  R flank pain with percussion. Straight leg raise negative.  Neurological: She is alert and oriented to person, place, and time. She has normal reflexes.       Results for orders placed in visit on 09/01/13  POCT URINALYSIS DIPSTICK      Result Value Range   Color, UA yellow      Clarity, UA clear     Glucose, UA neg     Bilirubin, UA small     Ketones, UA trace     Spec Grav, UA >=1.030     Blood, UA mod     pH, UA 5.5     Protein, UA trace     Urobilinogen, UA 0.2     Nitrite, UA neg     Leukocytes, UA Negative    POCT UA - MICROSCOPIC ONLY      Result Value Range   WBC, Ur, HPF, POC 2-3     RBC, urine, microscopic 16-18     Bacteria, U Microscopic 2+     Mucus, UA pos     Epithelial cells, urine per micros 2-3     Crystals, Ur, HPF, POC neg     Casts, Ur, LPF, POC neg     Yeast, UA neg    POCT CBC      Result Value Range   WBC 9.2  4.6 - 10.2 K/uL   Lymph, poc 3.0  0.6 - 3.4   POC LYMPH PERCENT 32.3  10 - 50 %L   MID (cbc) 0.5  0 - 0.9   POC MID % 5.9  0 - 12 %M   POC Granulocyte 5.7  2 - 6.9   Granulocyte percent 61.8  37 - 80 %G   RBC 5.16  4.04 - 5.48 M/uL   Hemoglobin 15.8  12.2 - 16.2 g/dL   HCT, POC 16.1 (*) 09.6 - 47.9 %   MCV 94.8  80 - 97 fL   MCH, POC 30.6  27 - 31.2 pg   MCHC 32.3  31.8 - 35.4 g/dL   RDW, POC 04.5     Platelet Count, POC 277  142 - 424 K/uL   MPV 9.6  0 - 99.8 fL    Assessment & Plan:  RUQ abdominal pain - Plan: POCT urinalysis dipstick, POCT UA - Microscopic Only, POCT CBC, Comprehensive metabolic panel, CT Abdomen Pelvis Wo Contrast, ketorolac (TORADOL) injection 60 mg  Nausea with vomiting - Plan: POCT urinalysis dipstick, POCT UA - Microscopic Only, POCT CBC, Comprehensive metabolic panel  Flank pain - Plan: POCT urinalysis dipstick, POCT UA - Microscopic Only, POCT CBC, Comprehensive metabolic panel, CT Abdomen Pelvis Wo Contrast, ketorolac (TORADOL) injection 60 mg  Urinary incontinence  Postlaminectomy syndrome, lumbar region - Plan: POCT urinalysis dipstick, POCT UA - Microscopic Only, POCT CBC, Comprehensive metabolic panel  Left lumbar radiculitis - Plan: POCT urinalysis dipstick, POCT UA - Microscopic Only, POCT CBC, Comprehensive metabolic panel  Back pain - Plan: POCT urinalysis dipstick, POCT  UA - Microscopic Only, POCT CBC, Comprehensive metabolic panel  Hematuria - Plan: CT Abdomen Pelvis Wo Contrast  Meds ordered this encounter  Medications  . ketorolac (TORADOL) injection 60 mg    Sig:

## 2013-09-04 ENCOUNTER — Telehealth: Payer: Self-pay | Admitting: Radiology

## 2013-09-04 DIAGNOSIS — R109 Unspecified abdominal pain: Secondary | ICD-10-CM

## 2013-09-04 DIAGNOSIS — R319 Hematuria, unspecified: Secondary | ICD-10-CM

## 2013-09-04 NOTE — Telephone Encounter (Signed)
It is unclear from the last OV note what the plan was if CT neg.  Would advise pt to RTC or go to ED if pain is acutely worse.  Will forward to Dr. Merla Riches to advise.    Looks like pt has only been seen by [place] one other time in the last year.  Urology ref may be appropriate to evaluate incont.  Ref back to neurosurg and/or ortho may be appropriate to re-eval chronic back pain

## 2013-09-04 NOTE — Telephone Encounter (Signed)
She has hematuria to go with this pain and needs evaluation by urology next

## 2013-09-04 NOTE — Telephone Encounter (Signed)
Thanks, she indicates she is not getting worse, just not improving. Would like Dr Merla Riches to advise.

## 2013-09-04 NOTE — Telephone Encounter (Signed)
Patient advised CT scan does not show kidney stones or any other abnormality she still has a lot of flank pain.

## 2013-09-05 NOTE — Telephone Encounter (Signed)
Pt advised.

## 2013-09-08 ENCOUNTER — Other Ambulatory Visit: Payer: Self-pay | Admitting: *Deleted

## 2013-09-09 ENCOUNTER — Emergency Department (HOSPITAL_COMMUNITY): Payer: Medicare Other

## 2013-09-09 ENCOUNTER — Emergency Department (HOSPITAL_COMMUNITY)
Admission: EM | Admit: 2013-09-09 | Discharge: 2013-09-09 | Disposition: A | Discharge status: Home / Self Care | Payer: Medicare Other | Attending: Emergency Medicine | Admitting: Emergency Medicine

## 2013-09-09 ENCOUNTER — Encounter (HOSPITAL_COMMUNITY): Payer: Self-pay | Admitting: Emergency Medicine

## 2013-09-09 DIAGNOSIS — M545 Low back pain, unspecified: Secondary | ICD-10-CM | POA: Insufficient documentation

## 2013-09-09 DIAGNOSIS — Z79899 Other long term (current) drug therapy: Secondary | ICD-10-CM | POA: Insufficient documentation

## 2013-09-09 DIAGNOSIS — R1011 Right upper quadrant pain: Secondary | ICD-10-CM | POA: Insufficient documentation

## 2013-09-09 DIAGNOSIS — I4891 Unspecified atrial fibrillation: Secondary | ICD-10-CM | POA: Insufficient documentation

## 2013-09-09 DIAGNOSIS — Z7982 Long term (current) use of aspirin: Secondary | ICD-10-CM | POA: Insufficient documentation

## 2013-09-09 DIAGNOSIS — Z9889 Other specified postprocedural states: Secondary | ICD-10-CM | POA: Insufficient documentation

## 2013-09-09 DIAGNOSIS — Z87891 Personal history of nicotine dependence: Secondary | ICD-10-CM | POA: Insufficient documentation

## 2013-09-09 DIAGNOSIS — Z9861 Coronary angioplasty status: Secondary | ICD-10-CM | POA: Insufficient documentation

## 2013-09-09 DIAGNOSIS — R110 Nausea: Secondary | ICD-10-CM | POA: Insufficient documentation

## 2013-09-09 DIAGNOSIS — R109 Unspecified abdominal pain: Secondary | ICD-10-CM

## 2013-09-09 DIAGNOSIS — I10 Essential (primary) hypertension: Secondary | ICD-10-CM | POA: Insufficient documentation

## 2013-09-09 DIAGNOSIS — E785 Hyperlipidemia, unspecified: Secondary | ICD-10-CM | POA: Insufficient documentation

## 2013-09-09 DIAGNOSIS — G8929 Other chronic pain: Secondary | ICD-10-CM | POA: Insufficient documentation

## 2013-09-09 LAB — URINE MICROSCOPIC-ADD ON

## 2013-09-09 LAB — COMPREHENSIVE METABOLIC PANEL
Albumin: 3.8 g/dL (ref 3.5–5.2)
BUN: 15 mg/dL (ref 6–23)
Calcium: 9.2 mg/dL (ref 8.4–10.5)
Creatinine, Ser: 0.75 mg/dL (ref 0.50–1.10)
GFR calc Af Amer: >90 mL/min (ref >90)
Glucose, Bld: 97 mg/dL (ref 70–99)
Total Protein: 7 g/dL (ref 6.0–8.3)

## 2013-09-09 LAB — URINALYSIS, ROUTINE W REFLEX MICROSCOPIC
Bilirubin Urine: NEGATIVE
Glucose, UA: NEGATIVE mg/dL
Ketones, ur: NEGATIVE mg/dL
Protein, ur: NEGATIVE mg/dL

## 2013-09-09 LAB — CBC
HCT: 43.5 % (ref 36.0–46.0)
Hemoglobin: 15.2 g/dL — ABNORMAL HIGH (ref 12.0–15.0)
MCH: 31.3 pg (ref 26.0–34.0)
MCHC: 34.9 g/dL (ref 30.0–36.0)
MCV: 89.7 fL (ref 78.0–100.0)
RDW: 13.9 % (ref 11.5–15.5)

## 2013-09-09 LAB — LIPASE, BLOOD: Lipase: 31 U/L (ref 11–59)

## 2013-09-09 MED ORDER — OXYCODONE-ACETAMINOPHEN 5-325 MG PO TABS: 1.0000 | ORAL_TABLET | Freq: Four times a day (QID) | ORAL | Status: DC | PRN

## 2013-09-09 MED ORDER — SODIUM CHLORIDE 0.9 % IV BOLUS (SEPSIS)
1000.0000 mL | Freq: Once | INTRAVENOUS | Status: AC
  Administered 2013-09-09: 1000 mL via INTRAVENOUS

## 2013-09-09 MED ORDER — MORPHINE SULFATE 4 MG/ML IJ SOLN
4.0000 mg | Freq: Once | INTRAMUSCULAR | Status: AC
  Administered 2013-09-09: 4 mg via INTRAVENOUS
  Filled 2013-09-09: qty 1

## 2013-09-09 MED ORDER — ONDANSETRON HCL 4 MG PO TABS: 4.0000 mg | ORAL_TABLET | Freq: Four times a day (QID) | ORAL | Status: DC

## 2013-09-09 MED ORDER — ONDANSETRON HCL 4 MG/2ML IJ SOLN
4.0000 mg | Freq: Once | INTRAMUSCULAR | Status: AC
  Administered 2013-09-09: 4 mg via INTRAVENOUS
  Filled 2013-09-09: qty 2

## 2013-09-09 NOTE — ED Provider Notes (Signed)
CSN: [id number]     Arrival date & time 09/09/13  1624 History   First MD Initiated Contact with Patient 09/09/13 1630     Chief Complaint  Patient presents with  . Abdominal Pain   (Consider location/radiation/quality/duration/timing/severity/associated sxs/prior Treatment) HPI Pt presents with c/o right upper abdominal pain as well as right flank pain.  Pain in right flank began approx 1 week ago.  She was seen at urgent care and had hematuria as well as abd/pelvic CT scan- this was reassuring.  Pt states that right flank pain has been constant.  Today she developed right upper adominal pain with nausea.  No vomiting, no fever/chills.  Denies dysuria.  Has no hx of gallbladder disease in the past.  Also has chronic low back pain and has had surgery in the past but states her right flank pain feels differently from the sciatica which used to radiate down right leg.  Pt states she did not received any pain medicatins from urgent care.  There are no other associated systemic symptoms, there are no other alleviating or modifying factors.   Past Medical History  Diagnosis Date  . PAF (paroxysmal atrial fibrillation)   . Hypertension   . Peripheral vascular disease 06/11/2009    Bilateral common iliac kissing stents (8X24 mm) . 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 She underwent successful balloon angioplasty.  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   . Hyperlipidemia   . Tobacco abuse   . Claudication   . S/P cardiac cath 2009    Normal   Past Surgical History  Procedure Laterality Date  . Cardiac catheterization      2009  . Back surgery    . Spine surgery     Family History  Problem Relation Age of Onset  . Hypertension Mother   . Heart disease Father   . Heart attack Father   . Hypertension Brother    History   Substance Use Topics  . Smoking status: Former Smoker    Types: Cigarettes    Quit date: 05/11/2012  . Smokeless tobacco: Never Used     Comment: electronic cigarette  . Alcohol Use: No   OB History   Grav Para Term Preterm Abortions TAB SAB Ect Mult Living                 Review of Systems ROS reviewed and all otherwise negative except for mentioned in HPI  Allergies  Pradaxa and Simvastatin  Home Medications   Current Outpatient Rx  Name  Route  Sig  Dispense  Refill  . aspirin EC 81 MG tablet   Oral   Take 81 mg by mouth every morning.          [name] atorvastatin (LIPITOR) 40 MG tablet   Oral   Take 40 mg by mouth every evening.         . clopidogrel (PLAVIX) 75 MG tablet   Oral   Take 1 tablet (75 mg total) by mouth daily.   30 tablet   11   . fish oil-omega-3 fatty acids 1000 MG capsule   Oral   Take 1 g by mouth every morning.          . metoprolol succinate (TOPROL-XL) 50 MG 24 hr tablet   Oral   Take 50 mg by mouth every  evening. Take with or immediately following a meal.         . valACYclovir (VALTREX) 500 MG tablet   Oral   Take 500 mg by mouth daily as needed (break out).          . ondansetron (ZOFRAN) 4 MG tablet   Oral   Take 1 tablet (4 mg total) by mouth every 6 (six) hours.   12 tablet   0   . oxyCODONE-acetaminophen (PERCOCET/ROXICET) 5-325 MG per tablet   Oral   Take 1-2 tablets by mouth every 6 (six) hours as needed for pain.   15 tablet   0    BP 153/74  Pulse 60  Temp(Src) 98 F (36.7 C) (Oral)  Resp 20  Ht 5\' 6"  (1.676 m)  Wt 187 lb (84.823 kg)  BMI 30.2 kg/m2  SpO2 100% Vitals reviewed Physical Exam Physical Examination: General appearance - alert, well appearing, and in no distress Mental status - alert, oriented to person, place, and time Eyes - no scleral icterus, no conjunctival injection Mouth - mucous membranes moist, pharynx normal without lesions Chest - clear to auscultation, no wheezes, rales or  rhonchi, symmetric air entry Heart - normal rate, regular rhythm, normal S1, S2, no murmurs, rubs, clicks or gallops Abdomen - soft, ttp in epigastric region and right upper abdomen- tenderness is mild, no gaurding or rebound tenderness, nondistended, no masses or organomegaly Back exam - full range of motion, no tenderness, palpable spasm or pain on motion Extremities - peripheral pulses normal, no pedal edema, no clubbing or cyanosis Skin - normal coloration and turgor, no rashes  ED Course  Procedures (including critical care time) Labs Review Labs Reviewed  URINALYSIS, ROUTINE W REFLEX MICROSCOPIC - Abnormal; Notable for the following:    Hgb urine dipstick SMALL (*)    Leukocytes, UA TRACE (*)    All other components within normal limits  CBC - Abnormal; Notable for the following:    Hemoglobin 15.2 (*)    All other components within normal limits  COMPREHENSIVE METABOLIC PANEL - Abnormal; Notable for the following:    Total Bilirubin 0.2 (*)    All other components within normal limits  URINE MICROSCOPIC-ADD ON - Abnormal; Notable for the following:    Squamous Epithelial / LPF FEW (*)    Bacteria, UA FEW (*)    All other components within normal limits  URINE CULTURE  LIPASE, BLOOD   Imaging Review US Abdomen Complete  09/09/2013   CLINICAL DATA:  Right upper quadrant pain  EXAM: ULTRASOUND ABDOMEN COMPLETE  COMPARISON:  09/01/2013  FINDINGS: Gallbladder  Mild gallbladder sludge is noted. No gallstones are seen. No increased wall thickness is noted.  Common bile duct  Diameter: 3 mm.  Liver  No focal lesion identified. Within normal limits in parenchymal echogenicity.  IVC  No abnormality visualized.  Pancreas  Visualized portion unremarkable.  Spleen  Size and appearance within normal limits.  Right Kidney  Length: 10.8 cm. Echogenicity within normal limits. No mass or hydronephrosis visualized.  Left Kidney  Length: 10.3 cm. Echogenicity within normal limits. No mass or  hydronephrosis visualized.  Abdominal aorta  No aneurysm visualized.  IMPRESSION: Gallbladder sludge. No cholelithiasis is noted. No other focal abnormality is seen.   Electronically Signed   By: Alcide Clever M.D.   On: 09/09/2013 17:56    EKG Interpretation   None       MDM   1. Abdominal pain   2. Flank pain  Pt presenting with c/o right upper and epigastric pain, as well as continued right flank pain over the past week.  Labs reassuring, ultrasound obtained to ensure no liver or gallbladder pathology and this was reassuring- there was some gallbladder sludge but no acute abnormalities.  Lipase reassuring.  Pt has f/u arranged for hematuria with urology on 11/5.  She had CT scan one week ago which was reassuring as well.  Low suspicion for acute emergent condition that requires further treatment tonight in the ED.  Pt treated with pain and nausea meds as well as IV hydration. No signs or symptoms of cauda equina.  Discharged with strict return precautions.  Pt agreeable with plan.   Ethelda Chick, MD 09/09/13 [phone]

## 2013-09-09 NOTE — ED Notes (Signed)
The pt is c/o abd and lower back pain for 1-2 weeks.  She was seen at urgent care Friday and was told that she had blood in her urine.  Today the pain in her abd is worse.  lmp none

## 2013-09-11 LAB — URINE CULTURE
Colony Count: NO GROWTH
Culture: NO GROWTH

## 2013-09-14 ENCOUNTER — Other Ambulatory Visit: Payer: Self-pay

## 2013-09-15 ENCOUNTER — Other Ambulatory Visit: Payer: Self-pay

## 2013-09-18 ENCOUNTER — Other Ambulatory Visit: Payer: Self-pay

## 2013-10-06 ENCOUNTER — Other Ambulatory Visit: Payer: Self-pay

## 2013-10-06 MED ORDER — METOPROLOL SUCCINATE ER 50 MG PO TB24: 50.0000 mg | ORAL_TABLET | Freq: Every evening | ORAL | Status: DC

## 2013-10-10 ENCOUNTER — Other Ambulatory Visit: Payer: Self-pay | Admitting: Cardiovascular Disease

## 2013-10-23 ENCOUNTER — Encounter: Payer: Self-pay | Admitting: Internal Medicine

## 2013-10-24 ENCOUNTER — Other Ambulatory Visit (INDEPENDENT_AMBULATORY_CARE_PROVIDER_SITE_OTHER): Payer: Medicare Other

## 2013-10-24 ENCOUNTER — Encounter: Payer: Self-pay | Admitting: Internal Medicine

## 2013-10-24 ENCOUNTER — Ambulatory Visit (INDEPENDENT_AMBULATORY_CARE_PROVIDER_SITE_OTHER): Payer: Medicare Other | Admitting: Internal Medicine

## 2013-10-24 VITALS — BP 130/78 | HR 80 | Ht 64.5 in | Wt 186.4 lb

## 2013-10-24 DIAGNOSIS — R1013 Epigastric pain: Secondary | ICD-10-CM

## 2013-10-24 DIAGNOSIS — Z1211 Encounter for screening for malignant neoplasm of colon: Secondary | ICD-10-CM

## 2013-10-24 DIAGNOSIS — K5900 Constipation, unspecified: Secondary | ICD-10-CM

## 2013-10-24 LAB — COMPREHENSIVE METABOLIC PANEL
ALT: 19 U/L (ref 0–35)
AST: 17 U/L (ref 0–37)
Alkaline Phosphatase: 71 U/L (ref 39–117)
Calcium: 9 mg/dL (ref 8.4–10.5)
Chloride: 102 mEq/L (ref 96–112)
Creatinine, Ser: 0.8 mg/dL (ref 0.4–1.2)
Potassium: 3.8 mEq/L (ref 3.5–5.1)

## 2013-10-24 LAB — CBC
Hemoglobin: 14.6 g/dL (ref 12.0–15.0)
MCHC: 33.7 g/dL (ref 30.0–36.0)
MCV: 88.7 fl (ref 78.0–100.0)
RDW: 13.8 % (ref 11.5–14.6)

## 2013-10-24 LAB — IGA: IgA: 146 mg/dL (ref 68–378)

## 2013-10-24 MED ORDER — MOVIPREP 100 G PO SOLR: ORAL | Status: DC

## 2013-10-24 MED ORDER — LINACLOTIDE 290 MCG PO CAPS: 290.0000 ug | ORAL_CAPSULE | Freq: Every day | ORAL | Status: DC

## 2013-10-24 MED ORDER — PANTOPRAZOLE SODIUM 40 MG PO TBEC: 40.0000 mg | DELAYED_RELEASE_TABLET | Freq: Every day | ORAL | Status: DC

## 2013-10-24 NOTE — Progress Notes (Signed)
Patient ID: Kayla Hopkins, female   DOB: 17-Sep-1961, 52 y.o.   MRN: [id number] HPI: Kayla Hopkins is a 52 year old female with a past medical history of atrial fibrillation, peripheral vascular disease status post percutaneous intervention on Plavix, hypertension, hyperlipidemia, tobacco abuse who is seen in consultation at the request of Elpidio Anis [facility] to evaluate abdominal pain and constipation. She reports that she is struggling with middle abdominal pain which can spread across her upper abdomen, particularly after eating. This is usually absent in the morning but worse after lunch and dinner. It can be sharp and associated with nausea. No vomiting. She also reports pain across her mid to lower back. She's unsure if these 2 are directly related. She tried Zofran which has not helped with the nausea. No dysphagia or odynophagia. She does have some heartburn on occasion. Bowel movements have been infrequent and she reports somewhat long-standing constipation. She reports averaging a bowel movement every 7-8 days. Her comments are often hard and difficult to pass. She has gone as long as 30 days without a bowel movement. She has tried Correctal and Ex-lax reports this led to severe lower abdominal cramping which she felt was worse than the constipation itself. She denies weight loss. She denies blood in her stool or melena. She does report a history of hemorrhoids with scant rectal bleeding in the past but none recently.  She has been seen by urology and was told that her current symptoms are not related to her kidneys.  She has never had endoscopic procedures  She is on Plavix under the direction of Dr. Kary Kos  Past Medical History  Diagnosis Date  . PAF (paroxysmal atrial fibrillation)   . Hypertension   . Peripheral vascular disease 06/11/2009    Bilateral common iliac kissing stents (8X24 mm) . Repeat angiography in July of 2013 showed severe in-stent restenosis in the right common iliac artery  stent. She underwent successful balloon angioplasty.  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   . Hyperlipidemia   . Tobacco abuse   . Claudication     clots in legs  . S/P cardiac cath 2009    Normal    Past Surgical History  Procedure Laterality Date  . Cardiac catheterization  2009    with stents  . Lumbar disc surgery      spinal stenosis  . Knee arthroscopy Bilateral     Current Outpatient Prescriptions  Medication Sig Dispense Refill  . aspirin EC 81 MG tablet Take 81 mg by mouth every morning.       [name] atorvastatin (LIPITOR) 40 MG tablet TAKE ONE TABLET BY MOUTH ONCE DAILY IN THE EVENING **NEED APPOINTMENT IN 3 MONTHS PER MD**  30 tablet  0  . clopidogrel (PLAVIX) 75 MG tablet Take 1 tablet (75 mg total) by mouth daily.  30 tablet  11  . fish oil-omega-3 fatty acids 1000 MG capsule Take 1 g by mouth every morning.       . gabapentin (NEURONTIN) 300 MG capsule       . metoprolol succinate (TOPROL-XL) 50 MG 24 hr tablet Take 1 tablet (50 mg total) by mouth every evening. Take with or immediately following a meal.  30 tablet  6  . ondansetron (ZOFRAN) 4 MG tablet Take 4 mg by mouth every 6 (six) hours as needed.      . valACYclovir (VALTREX) 500 MG tablet Take  500 mg by mouth daily as needed (break out).       . Linaclotide (LINZESS) 290 MCG CAPS capsule Take 1 capsule (290 mcg total) by mouth daily.  30 capsule  3  . MOVIPREP 100 G SOLR Use per prep instruction  1 kit  0  . pantoprazole (PROTONIX) 40 MG tablet Take 1 tablet (40 mg total) by mouth daily.  90 tablet  3   No current facility-administered medications for this visit.    Allergies  Allergen Reactions  . Pradaxa [Dabigatran Etexilate Mesylate]     Gastritis   . Zocor [Simvastatin]     Leg pains     Family History  Problem Relation Age of Onset  . Hypertension Mother   . Heart disease Father   . Heart attack Father    . Hypertension Brother   . Ovarian cancer Mother   . Lung cancer Mother   . Colon polyps Sister     History  Substance Use Topics  . Smoking status: Current Some Day Smoker    Types: Cigarettes  . Smokeless tobacco: Never Used     Comment: electronic cigarette  . Alcohol Use: No    ROS: As per history of present illness, otherwise negative  BP 130/78  Pulse 80  Ht 5' 4.5" (1.638 m)  Wt 186 lb 6 oz (84.539 kg)  BMI 31.51 kg/m2 Constitutional: Well-developed and well-nourished. No distress. HEENT: Normocephalic and atraumatic. Oropharynx is clear and moist. No oropharyngeal exudate. Conjunctivae are normal.  No scleral icterus. Neck: Neck supple. Trachea midline. Cardiovascular: Normal rate, regular rhythm and intact distal pulses.  Pulmonary/chest: Effort normal and breath sounds normal. No wheezing, rales or rhonchi. Abdominal: Soft, mild diffuse tenderness without rebound or guarding, nondistended. Bowel sounds active throughout.  Extremities: no clubbing, cyanosis, or edema Neurological: Alert and oriented to person place and time. Skin: Skin is warm and dry. No rashes noted. Psychiatric: Normal mood and affect. Behavior is normal.  RELEVANT LABS AND IMAGING: CBC    Component Value Date/Time   WBC 9.1 09/09/2013 1640   WBC 9.2 09/01/2013 1151   RBC 4.85 09/09/2013 1640   RBC 5.16 09/01/2013 1151   HGB 15.2* 09/09/2013 1640   HGB 15.8 09/01/2013 1151   HCT 43.5 09/09/2013 1640   HCT 48.9* 09/01/2013 1151   PLT 240 09/09/2013 1640   MCV 89.7 09/09/2013 1640   MCV 94.8 09/01/2013 1151   MCH 31.3 09/09/2013 1640   MCH 30.6 09/01/2013 1151   MCHC 34.9 09/09/2013 1640   MCHC 32.3 09/01/2013 1151   RDW 13.9 09/09/2013 1640   LYMPHSABS 2.4 05/02/2013 0846   MONOABS 0.5 05/02/2013 0846   EOSABS 0.2 05/02/2013 0846   BASOSABS 0.1 05/02/2013 0846    CMP     Component Value Date/Time   NA 137 09/09/2013 1640   K 4.0 09/09/2013 1640   CL 103 09/09/2013 1640   CO2 24  09/09/2013 1640   GLUCOSE 97 09/09/2013 1640   BUN 15 09/09/2013 1640   CREATININE 0.75 09/09/2013 1640   CREATININE 0.85 09/01/2013 1148   CALCIUM 9.2 09/09/2013 1640   PROT 7.0 09/09/2013 1640   ALBUMIN 3.8 09/09/2013 1640   AST 17 09/09/2013 1640   ALT 18 09/09/2013 1640   ALKPHOS 75 09/09/2013 1640   BILITOT 0.2* 09/09/2013 1640   GFRNONAA >90 09/09/2013 1640   GFRAA >90 09/09/2013 1640   CT ABDOMEN AND PELVIS WITHOUT CONTRAST   TECHNIQUE: Multidetector CT imaging of the abdomen  and pelvis was performed following the standard protocol without intravenous contrast.   COMPARISON:  09/13/2010   FINDINGS: No acute musculoskeletal findings. Visualized lung bases are clear.   Liver, gallbladder, spleen, pancreas, and adrenal glands are normal. Tiny focus of renal vascular calcification right renal hilum. Kidneys otherwise normal with no stones or hydronephrosis.   Nonobstructive bowel gas pattern. Appendix not identified. Bilateral iliac stents present with aortic calcification but no dilatation. No free fluid. Bladder normal. No acute abnormalities involving the reproductive organs. Left fundal uterine soft tissue convexity may represent leiomyomatous involvement. This is a similar appearance to the prior study. This area measures about 3.5 x 2.7 cm.   IMPRESSION: No acute abnormalities. No nephroureterolithiasis or hydroureteronephrosis. Isodense soft tissue mass left uterine fundus possibly representing fibroid, similar to prior study.     ULTRASOUND ABDOMEN COMPLETE   COMPARISON:  09/01/2013   FINDINGS: Gallbladder   Mild gallbladder sludge is noted. No gallstones are seen. No increased wall thickness is noted.   Common bile duct   Diameter: 3 mm.   Liver   No focal lesion identified. Within normal limits in parenchymal echogenicity.   IVC   No abnormality visualized.   Pancreas   Visualized portion unremarkable.   Spleen   Size and appearance within  normal limits.   Right Kidney   Length: 10.8 cm. Echogenicity within normal limits. No mass or hydronephrosis visualized.   Left Kidney   Length: 10.3 cm. Echogenicity within normal limits. No mass or hydronephrosis visualized.   Abdominal aorta   No aneurysm visualized.   IMPRESSION: Gallbladder sludge. No cholelithiasis is noted. No other focal abnormality is seen.   ASSESSMENT/PLAN: 52 year old female with a past medical history of atrial fibrillation, peripheral vascular disease status post percutaneous intervention on Plavix, hypertension, hyperlipidemia, tobacco abuse who is seen in consultation at the request of Elpidio Anis [facility] to evaluate abdominal pain and constipation.   1.  Epigastric abd pain -- I recommended initiation pantoprazole 40 mg daily. She is asked to take this 30 minutes before her first meal today. We will also proceed to upper endoscopy to exclude H. pylori and ulcer disease. We discussed the test today including the risks and benefits and she is agreeable to proceed. Labs today to include CBC, CMP, lipase, celiac panel and repeat TSH.  Her ultrasound did show gallbladder stones and therefore cannot rule out biliary dyskinesia, but want to rule out ulcer disease or significant gastroduodenitis for consideration of cholecystectomy or HIDA scan.  She can continue to use as needed and as directed Zofran for nausea.  2.  Constipation -- chronic, I recommended Linzess 290 mcg daily. I asked her to let me know if she develops diarrhea or tolerates this medication poorly.  3.  CRC screening -- she has never had colonoscopy, and I have recommended colonoscopy for this purpose. We discussed the test including risks and benefits and she is agreeable to proceed. This can be performed on the same day as her upper endoscopy.   Prior to the procedures, we will seek permission from Dr. Kirke Corin Plavix 5 days before these procedures.

## 2013-10-24 NOTE — Patient Instructions (Addendum)
You have been given a separate informational sheet regarding your tobacco use, the importance of quitting and local resources to help you quit.   You have been scheduled for a colonoscopy/endoscopy with propofol. Please follow written instructions given to you at your visit today.  Please pick up your prep kit at the pharmacy within the next 1-3 days. If you use inhalers (even only as needed), please bring them with you on the day of your procedure. Your physician has requested that you go to www.startemmi.com and enter the access code given to you at your visit today. This web site gives a general overview about your procedure. However, you should still follow specific instructions given to you by our office regarding your preparation for the procedure.   We have sent the following medications to your pharmacy for you to pick up at your convenience: Linzess 290 mcg daily  Pantoprazole 40 everyday Moviprep; you were given instructions today at your office visit  Your physician has requested that you go to the basement for the following lab work before leaving today: CBC, CMP, TSH, Lipase, Celiac panel                                               We are excited to introduce MyChart, a new best-in-class service that provides you online access to important information in your electronic medical record. We want to make it easier for you to view your health information - all in one secure location - when and where you need it. We expect MyChart will enhance the quality of care and service we provide.  When you register for MyChart, you can:    View your test results.    Request appointments and receive appointment reminders via email.    Request medication renewals.    View your medical history, allergies, medications and immunizations.    Communicate with your physician's office through a password-protected site.    Conveniently print information such as your medication lists.  To find  out if MyChart is right for you, please talk to a member of our clinical staff today. We will gladly answer your questions about this free health and wellness tool.  If you are age 11 or older and want a member of your family to have access to your record, you must provide written consent by completing a proxy form available at our office. Please speak to our clinical staff about guidelines regarding accounts for patients younger than age 26.  As you activate your MyChart account and need any technical assistance, please call the MyChart technical support line at [phone]) [phone]-[phone]) or email your question to [email or web address]. If you email your question(s), please include your name, a return phone number and the best time to reach you.  If you have non-urgent health-related questions, you can send a message to our office through MyChart at [email or web address]. If you have a medical emergency, call 911.  Thank you for using MyChart as your new health and wellness resource!   MyChart licensed from [employer],  4540-9811. Patents Pending.

## 2013-11-12 ENCOUNTER — Other Ambulatory Visit: Payer: Self-pay | Admitting: Cardiovascular Disease

## 2013-11-28 ENCOUNTER — Telehealth: Payer: Self-pay | Admitting: Internal Medicine

## 2013-11-28 NOTE — Telephone Encounter (Signed)
I cannot find where a letter was sent to Dr Fletcher Anon for management of Plavix. Informed pt I will call her back. Sent a staff message to Marella Chimes, [place].

## 2013-11-28 NOTE — Telephone Encounter (Signed)
Left message on pt's voicemail to not prep tonight and that we are cancelling procedures for tomorrow We never got the okay to hold Plavix before procedure and thus this med was not held Need to seek permission from Dr. Fletcher Anon to hold plavix 5 days before endo/colon Reschedule endo/colon

## 2013-11-29 ENCOUNTER — Encounter: Payer: Medicare Other | Admitting: Internal Medicine

## 2013-11-29 NOTE — Telephone Encounter (Signed)
Did not hear from Marella Chimes, [place] about Plavix instructions. Pt did not do the prep and is waiting to be r/s. She states the best days for her are Monday, Tuesday, and Wednesday.Stacy, please get cardiology clearance from Dr Fletcher Anon. Thanks.

## 2013-12-03 NOTE — Telephone Encounter (Signed)
Plavix can be stopped 5 days before procedure and should be resumed after if no bleeding issues.

## 2013-12-05 ENCOUNTER — Telehealth: Payer: Self-pay | Admitting: Gastroenterology

## 2013-12-05 NOTE — Telephone Encounter (Signed)
lvm for pt to call me back to schedule procedure with Dr. Hilarie Fredrickson

## 2013-12-06 NOTE — Telephone Encounter (Signed)
lvm for pt to call me back to schedule her procedure

## 2013-12-12 ENCOUNTER — Ambulatory Visit (INDEPENDENT_AMBULATORY_CARE_PROVIDER_SITE_OTHER): Payer: Medicare Other | Admitting: Cardiovascular Disease

## 2013-12-12 ENCOUNTER — Encounter: Payer: Self-pay | Admitting: Cardiovascular Disease

## 2013-12-12 VITALS — BP 138/92 | HR 66 | Ht 66.0 in | Wt 183.0 lb

## 2013-12-12 DIAGNOSIS — E785 Hyperlipidemia, unspecified: Secondary | ICD-10-CM

## 2013-12-12 DIAGNOSIS — I739 Peripheral vascular disease, unspecified: Secondary | ICD-10-CM

## 2013-12-12 DIAGNOSIS — I10 Essential (primary) hypertension: Secondary | ICD-10-CM

## 2013-12-12 NOTE — Progress Notes (Signed)
HPI  [name] is a 53 year old female who is here today for a followup visit.  She has no known CAD per cath back in 2009. She does have known history of peripheral arterial disease, paroxysmal atrial fibrillation, hypertension and hyperlipidemia.  She is followed for bilateral iliac stenting. She underwent bilateral kissing stent placement with an 8 x 24 mm Genesis stent in 2010.  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 She had successful balloon angioplasty without significant residual stenosis. She develops recurrent in-stent restenosis. This was treated with a covered stent placement. There was moderate stenosis in distal left common iliac artery at that time.  She was seen for severe left hip and thigh claudication in 04/2013.  ABI significantly dropped on the left to 0.61 with evidence of severe stenosis in distal left common iliac artery.  She underwent angiography which showed 70% stenosis in the distal left common iliac artery just before the origin of the internal iliac artery. There was greater than 40 mm systolic gradient across the lesion. Thus, this was treated with a self-expanding stent placement without complications.  She continues to have chronic back discomfort. She is using electronic cigarettes with nicotine.   Allergies  Allergen Reactions  . Pradaxa [Dabigatran Etexilate Mesylate]     Gastritis   . Zocor [Simvastatin]     Leg pains      Current Outpatient Prescriptions on File Prior to Visit  Medication Sig Dispense Refill  . aspirin EC 81 MG tablet Take 81 mg by mouth every morning.       [name] atorvastatin (LIPITOR) 40 MG tablet Take 1 tablet (40 mg total) by mouth daily at 6 PM.  30 tablet  6  . clopidogrel (PLAVIX) 75 MG tablet Take 1 tablet (75 mg total) by mouth daily.  30 tablet  11  . fish oil-omega-3 fatty acids 1000 MG capsule Take 1 g by mouth every morning.        . metoprolol succinate (TOPROL-XL) 50 MG 24 hr tablet TAKE ONE TABLET BY MOUTH ONCE DAILY IN THE EVENING TAKE  WITH  OR  IMMEDIATELY  FOLLOWING  A  MEAL  30 tablet  6  . MOVIPREP 100 G SOLR Use per prep instruction  1 kit  0  . valACYclovir (VALTREX) 500 MG tablet Take 500 mg by mouth daily as needed (break out).        No current facility-administered medications on file prior to visit.     Past Medical History  Diagnosis Date  . PAF (paroxysmal atrial fibrillation)   . Hypertension   . Peripheral vascular disease 06/11/2009    Bilateral common iliac kissing stents (8X24 mm) . 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 She underwent successful balloon angioplasty.  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   . Hyperlipidemia   . Tobacco abuse   . Claudication     clots in legs  . S/P cardiac cath 2009    Normal     Past Surgical History  Procedure Laterality Date  . Cardiac catheterization  2009    with stents  . Lumbar disc surgery      spinal stenosis  . Knee arthroscopy Bilateral      Family History  Problem Relation Age of Onset  .  Hypertension Mother   . Heart disease Father   . Heart attack Father   . Hypertension Brother   . Ovarian cancer Mother   . Lung cancer Mother   . Colon polyps Sister      History   Social History  . Marital Status: Single    Spouse Name: N/A    Number of Children: 3  . Years of Education: N/A   Occupational History  . Not on file.   Social History Main Topics  . Smoking status: Current Some Day Smoker    Types: Cigarettes  . Smokeless tobacco: Never Used     Comment: electronic cigarette  . Alcohol Use: No  . Drug Use: No  . Sexual Activity: Yes   Other Topics Concern  . Not on file   Social History Narrative  . No narrative on file       PHYSICAL  EXAM   BP 138/92  Pulse 66  Ht _0  (1.676 m)  Wt 183 lb (83.008 kg)  BMI 29.55 kg/m2 Constitutional: She is oriented to person, place, and time. She appears well-developed and well-nourished. No distress.  HENT: No nasal discharge.  Head: Normocephalic and atraumatic.  Eyes: Pupils are equal and round. Right eye exhibits no discharge. Left eye exhibits no discharge.  Neck: Normal range of motion. Neck supple. No JVD present. No thyromegaly present.  Cardiovascular: Normal rate, regular rhythm, normal heart sounds. Exam reveals no gallop and no friction rub. No murmur heard.  Pulmonary/Chest: Effort normal and breath sounds normal. No stridor. No respiratory distress. She has no wheezes. She has no rales. She exhibits no tenderness.  Abdominal: Soft. Bowel sounds are normal. She exhibits no distension. There is no tenderness. There is no rebound and no guarding.  Musculoskeletal: Normal range of motion. She exhibits no edema and no tenderness.  Neurological: She is alert and oriented to person, place, and time. Coordination normal.  Skin: Skin is warm and dry. No rash noted. She is not diaphoretic. No erythema. No pallor.  Psychiatric: She has a normal mood and affect. Her behavior is normal. Judgment and thought content normal.  Vascular: Femoral pulses:  very diminished on both sides. Distal pulses are faint.  EKG: Normal sinus rhythm with possible left atrial enlargement.   ASSESSMENT AND PLAN

## 2013-12-12 NOTE — Patient Instructions (Addendum)
Your physician has requested that you have an ankle brachial index (ABI). During this test an ultrasound and blood pressure cuff are used to evaluate the arteries that supply the arms and legs with blood. Allow thirty minutes for this exam. There are no restrictions or special instructions.  Your physician has requested that you have an aorto-iliac duplex. During this test, an ultrasound is used to evaluate the aorta and the iliac arteries. Allow 30 minutes for this exam. Do not eat after midnight the day before and avoid carbonated beverages  Your physician wants you to follow-up in: 6 months with Dr. Fletcher Anon. You will receive a reminder letter in the mail two months in advance. If you don't receive a letter, please call our office to schedule the follow-up appointment.

## 2013-12-13 NOTE — Assessment & Plan Note (Signed)
Lab Results  Component Value Date   CHOL 188 06/08/2012   HDL 46.00 06/08/2012   LDLCALC  Value: 147        Total Cholesterol/HDL:CHD Risk Coronary Heart Disease Risk Table                     Men   Women  1/2 Average Risk   3.4   3.3* 08/11/2008   LDLDIRECT 77.8 06/08/2012   TRIG 474.0* 06/08/2012   CHOLHDL 4 06/08/2012   continue treatment with atorvastatin.

## 2013-12-13 NOTE — Assessment & Plan Note (Signed)
Blood pressure is reasonably controlled on current medications. 

## 2013-12-13 NOTE — Assessment & Plan Note (Signed)
She is doing reasonably well. She has chronic back pain which makes it harder to determine if she has true claudication or not. I recommend an ABI and aortoiliac duplex. This was supposed to be done 3 months ago.

## 2013-12-15 ENCOUNTER — Ambulatory Visit (HOSPITAL_COMMUNITY): Payer: Medicare Other

## 2013-12-15 ENCOUNTER — Ambulatory Visit (HOSPITAL_COMMUNITY): Payer: Medicare Other | Attending: Cardiovascular Disease

## 2013-12-15 DIAGNOSIS — I10 Essential (primary) hypertension: Secondary | ICD-10-CM | POA: Insufficient documentation

## 2013-12-15 DIAGNOSIS — I739 Peripheral vascular disease, unspecified: Secondary | ICD-10-CM

## 2013-12-15 DIAGNOSIS — Z87891 Personal history of nicotine dependence: Secondary | ICD-10-CM | POA: Insufficient documentation

## 2013-12-15 DIAGNOSIS — E785 Hyperlipidemia, unspecified: Secondary | ICD-10-CM | POA: Insufficient documentation

## 2013-12-15 DIAGNOSIS — I700 Atherosclerosis of aorta: Secondary | ICD-10-CM

## 2013-12-15 DIAGNOSIS — I708 Atherosclerosis of other arteries: Secondary | ICD-10-CM | POA: Insufficient documentation

## 2014-01-30 ENCOUNTER — Ambulatory Visit: Payer: Medicare Other | Admitting: Physician Assistant

## 2014-03-08 ENCOUNTER — Emergency Department (HOSPITAL_COMMUNITY): Payer: Medicare Other

## 2014-03-08 ENCOUNTER — Encounter (HOSPITAL_COMMUNITY): Payer: Self-pay | Admitting: Emergency Medicine

## 2014-03-08 ENCOUNTER — Observation Stay (HOSPITAL_COMMUNITY)
Admission: EM | Admit: 2014-03-08 | Discharge: 2014-03-09 | Disposition: A | Discharge status: Home / Self Care | Payer: Medicare Other | Attending: Internal Medicine | Admitting: Internal Medicine

## 2014-03-08 DIAGNOSIS — I739 Peripheral vascular disease, unspecified: Secondary | ICD-10-CM | POA: Insufficient documentation

## 2014-03-08 DIAGNOSIS — M7989 Other specified soft tissue disorders: Secondary | ICD-10-CM | POA: Insufficient documentation

## 2014-03-08 DIAGNOSIS — Z7902 Long term (current) use of antithrombotics/antiplatelets: Secondary | ICD-10-CM | POA: Insufficient documentation

## 2014-03-08 DIAGNOSIS — R233 Spontaneous ecchymoses: Secondary | ICD-10-CM

## 2014-03-08 DIAGNOSIS — R55 Syncope and collapse: Secondary | ICD-10-CM | POA: Insufficient documentation

## 2014-03-08 DIAGNOSIS — M549 Dorsalgia, unspecified: Secondary | ICD-10-CM | POA: Insufficient documentation

## 2014-03-08 DIAGNOSIS — M961 Postlaminectomy syndrome, not elsewhere classified: Secondary | ICD-10-CM | POA: Insufficient documentation

## 2014-03-08 DIAGNOSIS — K5900 Constipation, unspecified: Secondary | ICD-10-CM

## 2014-03-08 DIAGNOSIS — M5416 Radiculopathy, lumbar region: Secondary | ICD-10-CM

## 2014-03-08 DIAGNOSIS — R072 Precordial pain: Principal | ICD-10-CM | POA: Insufficient documentation

## 2014-03-08 DIAGNOSIS — G9001 Carotid sinus syncope: Secondary | ICD-10-CM

## 2014-03-08 DIAGNOSIS — R238 Other skin changes: Secondary | ICD-10-CM

## 2014-03-08 DIAGNOSIS — I10 Essential (primary) hypertension: Secondary | ICD-10-CM | POA: Insufficient documentation

## 2014-03-08 DIAGNOSIS — R0602 Shortness of breath: Secondary | ICD-10-CM | POA: Insufficient documentation

## 2014-03-08 DIAGNOSIS — Z7982 Long term (current) use of aspirin: Secondary | ICD-10-CM | POA: Insufficient documentation

## 2014-03-08 DIAGNOSIS — R079 Chest pain, unspecified: Secondary | ICD-10-CM

## 2014-03-08 DIAGNOSIS — R0789 Other chest pain: Secondary | ICD-10-CM

## 2014-03-08 DIAGNOSIS — R002 Palpitations: Secondary | ICD-10-CM

## 2014-03-08 DIAGNOSIS — F17200 Nicotine dependence, unspecified, uncomplicated: Secondary | ICD-10-CM | POA: Insufficient documentation

## 2014-03-08 DIAGNOSIS — IMO0002 Reserved for concepts with insufficient information to code with codable children: Secondary | ICD-10-CM | POA: Insufficient documentation

## 2014-03-08 DIAGNOSIS — E785 Hyperlipidemia, unspecified: Secondary | ICD-10-CM | POA: Insufficient documentation

## 2014-03-08 DIAGNOSIS — I4891 Unspecified atrial fibrillation: Secondary | ICD-10-CM | POA: Insufficient documentation

## 2014-03-08 LAB — CBC
HCT: 41.6 % (ref 36.0–46.0)
HEMATOCRIT: 45.6 % (ref 36.0–46.0)
HEMOGLOBIN: 15.3 g/dL — AB (ref 12.0–15.0)
Hemoglobin: 13.6 g/dL (ref 12.0–15.0)
MCH: 30.4 pg (ref 26.0–34.0)
MCH: 30 pg (ref 26.0–34.0)
MCHC: 33.6 g/dL (ref 30.0–36.0)
MCHC: 32.7 g/dL (ref 30.0–36.0)
MCV: 90.7 fL (ref 78.0–100.0)
MCV: 91.8 fL (ref 78.0–100.0)
Platelets: 223 10*3/uL (ref 150–400)
Platelets: 197 10*3/uL (ref 150–400)
RBC: 5.03 MIL/uL (ref 3.87–5.11)
RBC: 4.53 MIL/uL (ref 3.87–5.11)
RDW: 13.7 % (ref 11.5–15.5)
RDW: 13.6 % (ref 11.5–15.5)
WBC: 8.7 10*3/uL (ref 4.0–10.5)
WBC: 8.3 10*3/uL (ref 4.0–10.5)

## 2014-03-08 LAB — CREATININE, SERUM
Creatinine, Ser: 0.75 mg/dL (ref 0.50–1.10)
GFR calc Af Amer: >90 mL/min (ref >90)
GFR calc non Af Amer: >90 mL/min (ref >90)

## 2014-03-08 LAB — BASIC METABOLIC PANEL
BUN: 14 mg/dL (ref 6–23)
CO2: 23 mEq/L (ref 19–32)
Calcium: 8.8 mg/dL (ref 8.4–10.5)
Chloride: 104 mEq/L (ref 96–112)
Creatinine, Ser: 0.61 mg/dL (ref 0.50–1.10)
GFR calc Af Amer: >90 mL/min (ref >90)
GFR calc non Af Amer: >90 mL/min (ref >90)
GLUCOSE: 93 mg/dL (ref 70–99)
POTASSIUM: 4.4 meq/L (ref 3.7–5.3)
Sodium: 139 mEq/L (ref 137–147)

## 2014-03-08 LAB — TROPONIN I
Troponin I: <0.3 ng/mL (ref <0.30)
Troponin I: <0.3 ng/mL (ref <0.30)

## 2014-03-08 LAB — I-STAT TROPONIN, ED: Troponin i, poc: 0 ng/mL (ref 0.00–0.08)

## 2014-03-08 LAB — PRO B NATRIURETIC PEPTIDE: Pro B Natriuretic peptide (BNP): 187.3 pg/mL — ABNORMAL HIGH (ref 0–125)

## 2014-03-08 MED ORDER — ASPIRIN EC 81 MG PO TBEC
81.0000 mg | DELAYED_RELEASE_TABLET | Freq: Every day | ORAL | Status: DC
  Administered 2014-03-08: 81 mg via ORAL
  Filled 2014-03-08 (×2): qty 1

## 2014-03-08 MED ORDER — ONDANSETRON HCL 4 MG/2ML IJ SOLN: 4.0000 mg | Freq: Four times a day (QID) | INTRAMUSCULAR | Status: DC | PRN

## 2014-03-08 MED ORDER — OMEGA-3-ACID ETHYL ESTERS 1 G PO CAPS
1.0000 g | ORAL_CAPSULE | Freq: Every day | ORAL | Status: DC
  Filled 2014-03-08: qty 1

## 2014-03-08 MED ORDER — PNEUMOCOCCAL VAC POLYVALENT 25 MCG/0.5ML IJ INJ
0.5000 mL | INJECTION | INTRAMUSCULAR | Status: DC
  Filled 2014-03-08: qty 0.5

## 2014-03-08 MED ORDER — MORPHINE SULFATE 4 MG/ML IJ SOLN
4.0000 mg | Freq: Once | INTRAMUSCULAR | Status: AC
  Administered 2014-03-08: 4 mg via INTRAVENOUS
  Filled 2014-03-08: qty 1

## 2014-03-08 MED ORDER — CLOPIDOGREL BISULFATE 75 MG PO TABS
75.0000 mg | ORAL_TABLET | Freq: Every day | ORAL | Status: DC
  Filled 2014-03-08 (×2): qty 1

## 2014-03-08 MED ORDER — NITROGLYCERIN 0.4 MG SL SUBL
SUBLINGUAL_TABLET | SUBLINGUAL | Status: AC
  Filled 2014-03-08: qty 2.5

## 2014-03-08 MED ORDER — NITROGLYCERIN 0.4 MG SL SUBL
0.4000 mg | SUBLINGUAL_TABLET | SUBLINGUAL | Status: DC | PRN
  Administered 2014-03-08: 0.4 mg via SUBLINGUAL

## 2014-03-08 MED ORDER — IOHEXOL 350 MG/ML SOLN
100.0000 mL | Freq: Once | INTRAVENOUS | Status: AC | PRN
  Administered 2014-03-08: 100 mL via INTRAVENOUS

## 2014-03-08 MED ORDER — OMEGA-3 FATTY ACIDS 1000 MG PO CAPS: 1.0000 g | ORAL_CAPSULE | Freq: Every morning | ORAL | Status: DC

## 2014-03-08 MED ORDER — ENOXAPARIN SODIUM 40 MG/0.4ML ~~LOC~~ SOLN
40.0000 mg | SUBCUTANEOUS | Status: DC
Start: 2014-03-08 — End: 2014-03-09
  Administered 2014-03-08: 40 mg via SUBCUTANEOUS
  Filled 2014-03-08 (×2): qty 0.4

## 2014-03-08 MED ORDER — CLOPIDOGREL BISULFATE 75 MG PO TABS
75.0000 mg | ORAL_TABLET | Freq: Every day | ORAL | Status: DC
  Administered 2014-03-08: 75 mg via ORAL
  Filled 2014-03-08 (×2): qty 1

## 2014-03-08 MED ORDER — ACETAMINOPHEN 325 MG PO TABS: 650.0000 mg | ORAL_TABLET | Freq: Four times a day (QID) | ORAL | Status: DC | PRN

## 2014-03-08 MED ORDER — ACETAMINOPHEN 650 MG RE SUPP: 650.0000 mg | Freq: Four times a day (QID) | RECTAL | Status: DC | PRN

## 2014-03-08 MED ORDER — ACETAMINOPHEN 325 MG PO TABS
650.0000 mg | ORAL_TABLET | Freq: Once | ORAL | Status: AC
  Administered 2014-03-08: 650 mg via ORAL
  Filled 2014-03-08: qty 2

## 2014-03-08 MED ORDER — ATORVASTATIN CALCIUM 40 MG PO TABS
40.0000 mg | ORAL_TABLET | Freq: Every day | ORAL | Status: DC
  Administered 2014-03-08: 40 mg via ORAL
  Filled 2014-03-08 (×2): qty 1

## 2014-03-08 MED ORDER — KETOROLAC TROMETHAMINE 15 MG/ML IJ SOLN
15.0000 mg | Freq: Three times a day (TID) | INTRAMUSCULAR | Status: DC | PRN
  Administered 2014-03-08 – 2014-03-09 (×3): 15 mg via INTRAVENOUS
  Filled 2014-03-08 (×3): qty 1

## 2014-03-08 MED ORDER — SODIUM CHLORIDE 0.9 % IJ SOLN
3.0000 mL | Freq: Two times a day (BID) | INTRAMUSCULAR | Status: DC
  Administered 2014-03-08 – 2014-03-09 (×2): 3 mL via INTRAVENOUS

## 2014-03-08 MED ORDER — OXYCODONE HCL 5 MG PO TABS
5.0000 mg | ORAL_TABLET | ORAL | Status: DC | PRN
  Administered 2014-03-08 – 2014-03-09 (×2): 5 mg via ORAL
  Filled 2014-03-08 (×2): qty 1

## 2014-03-08 MED ORDER — REGADENOSON 0.4 MG/5ML IV SOLN
0.4000 mg | Freq: Once | INTRAVENOUS | Status: AC
  Administered 2014-03-09: 0.4 mg via INTRAVENOUS
  Filled 2014-03-08: qty 5

## 2014-03-08 MED ORDER — ASPIRIN EC 81 MG PO TBEC
81.0000 mg | DELAYED_RELEASE_TABLET | Freq: Every morning | ORAL | Status: DC
  Filled 2014-03-08: qty 1

## 2014-03-08 MED ORDER — ONDANSETRON HCL 4 MG PO TABS: 4.0000 mg | ORAL_TABLET | Freq: Four times a day (QID) | ORAL | Status: DC | PRN

## 2014-03-08 MED ORDER — METOPROLOL SUCCINATE ER 50 MG PO TB24
50.0000 mg | ORAL_TABLET | Freq: Every day | ORAL | Status: DC
  Administered 2014-03-08: 50 mg via ORAL
  Filled 2014-03-08 (×2): qty 1

## 2014-03-08 MED ORDER — ATORVASTATIN CALCIUM 40 MG PO TABS
40.0000 mg | ORAL_TABLET | Freq: Every day | ORAL | Status: DC
  Filled 2014-03-08: qty 1

## 2014-03-08 MED ORDER — ALUM & MAG HYDROXIDE-SIMETH 200-200-20 MG/5ML PO SUSP
30.0000 mL | Freq: Once | ORAL | Status: AC
  Administered 2014-03-09: 30 mL via ORAL
  Filled 2014-03-08: qty 30

## 2014-03-08 NOTE — ED Notes (Signed)
Pt also reports waking with leg pain the other night.  Pt states she then had a small bruise and now a large bruise and knot on leg.  Pt denies any injury or trauma.

## 2014-03-08 NOTE — Progress Notes (Signed)
Progress note. Chief complaint right neck and jaw pain. History of present illness. This 53 year old female was admitted earlier today with chest pain/pressure substernally responding to sublingual nitroglycerin and with associated shortness of breath, dyspnea on exertion and neck pain. The patient complains of right neck and jaw pain without chest pain currently. A 12-lead EKG was obtained at the bedside and this resulted normal sinus rhythm, normal EKG. I reviewed this and see no indication of acute ischemia. Came to see the patient at bedside in followup. I did order sublingual nitroglycerin and oxycodone 5 mg every 4 hours when necessary. At bedside the patient continues to complain of neck and jaw pain on the right but this has improved following nitroglycerin and oxycodone. I deferred the use of morphine as the patient states she did not tolerate this well and given in the emergency room. The patient denies any current chest pain. Denies sensation of chest pressure. There is associated no nausea or diaphoresis. Patient does indicate she has a prior history of GERD. She has had no recent trauma or injury to her neck or jaw. Vital signs. Temperature 97.8, pulse 82, respiration 18, blood pressure 148/89. O2 sats 99%. Cardiac. Rate and rhythm regular. Heart sounds somewhat distant. Lungs. Breath sounds clear and equal. Abdomen. Soft with positive bowel sounds. No pain. Impression/plan. Problem #1. Right jaw and neck pain. Patient's initial troponin resulted negative. Bedside EKG does not look suggestive of acute ischemia. Patient did however report improvement in pain with sublingual  Nitroglycerin. I will give her dose of Maalox in case this is pain associated from GERD. Sublingual nitroglycerin order in place in case this is cardiac pain. OxyIR ordered for possible musculoskeletal origin to her pain. We'll follow for any troponin elevation in subsequent lab draws. Clinically stable per bedside exam.  Cardiology is following concurrently.

## 2014-03-08 NOTE — Consult Note (Addendum)
Patient seen, interviewed and examined.  Agree with Assessment and Plan as outlined by Perry Mount PA-C.  Patient with a history of significant PVD, PAF and HTN who presents with complaints CP.  This started while she was lying down.  She thinks she went into PAF for a short while.  Of note she is not on chronic anticoagulation and has a high CHADs2VASC score of at least 4 (female/HTN/PVD).  Her CP comes and goes and is associated with SOB and nausea.  She had a similar episode in 2009 and cath was normal.  She continues to smoke.  EKG is nonischemic and initial cardiac enzymes are normal.  Continue ASA/statin/Plavix/beta blocker.  Plan 2D echo to assess LVF and NPO after MN for Lexiscan myoview in the am.  [place] had a spontaneous hematoma on her calf so will hold off on full dose anticoagulation.

## 2014-03-08 NOTE — Progress Notes (Signed)
RN entered pt room and pt c/o chest pain and dyspnea.  RN repositioned pt to sitting in bed, placed pt on 2L [place] and perform EKG.  EKG reading NSR heart rate of 65. VS 179/108, O2 100%.  Pt stated pain is better than before and she can breath better.  Pt continued on 2L [place] and given PRN 15mg  IV of Toradol.  Pt stated pain down to 7/10 from 10/10 previously.  Lights turned off, call light in reach, RN will continue to monitor.   Nolon Nations, RN

## 2014-03-08 NOTE — Progress Notes (Signed)
RN reassessed pt and pt stated throbbing pain in chest and right breast were gone but jaw/neck pain were still there at 9/10.  Consulting Cardiology paged and stated "does not feel it is cardiology related to page attending".  RN paged [facility] given orders to give SL nitro and 1 Oxycodone IR. When RN entered room pt stated that CP and groin pain had returned with with 10/10 pain.  Administered new medications, pt stated after 5 minutes of Nitro "I feel much better like weight is lifted off of my chest". Pt resting in bed with call light in, RN will continue to monitor.   Nolon Nations, RN

## 2014-03-08 NOTE — H&P (Signed)
[facility] History and Physical  Kayla Hopkins NIO:[id number] DOB: Mar 22, 1961 DOA: 03/08/2014  Referring physician:  PCP: No PCP Per Patient  Specialists:   Chief Complaint: chest pain   HPI: Kayla Hopkins is a 53 y.o. female with PMH of PAF, HTN,PAD s/p stenting presented with intermittent substernal chest pressure responded to SL NTG in ED; she also reports associated with SOB, DOE and neck pain; denies cough, no fever, no n/v/d  Review of Systems: The patient denies anorexia, fever, weight loss,, vision loss, decreased hearing, hoarseness, chest pain, syncope, dyspnea on exertion, peripheral edema, balance deficits, hemoptysis, abdominal pain, melena, hematochezia, severe indigestion/heartburn, hematuria, incontinence, genital sores, muscle weakness, suspicious skin lesions, transient blindness, difficulty walking, depression, unusual weight change, abnormal bleeding, enlarged lymph nodes, angioedema, and breast masses.    Past Medical History  Diagnosis Date  . PAF (paroxysmal atrial fibrillation)   . Hypertension   . Peripheral vascular disease 06/11/2009    Bilateral common iliac kissing stents (8X24 mm) . 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 She underwent successful balloon angioplasty.  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   . Hyperlipidemia   . Tobacco abuse   . Claudication     clots in legs  . S/P cardiac cath 2009    Normal   Past Surgical History  Procedure Laterality Date  . Cardiac catheterization  2009    with stents  . Lumbar disc surgery      spinal stenosis  . Knee arthroscopy Bilateral    Social History:  reports that she has been smoking Cigarettes.  She has been smoking about 0.00 packs per day. She has never used smokeless tobacco. She reports that she does not drink alcohol or  use illicit drugs. Home;  where does patient live--home, ALF, SNF? and with whom if at home? Yes;  Can patient participate in ADLs?  Allergies  Allergen Reactions  . Pradaxa [Dabigatran Etexilate Mesylate]     Gastritis   . Zocor [Simvastatin]     Leg pains     Family History  Problem Relation Age of Onset  . Hypertension Mother   . Heart disease Father   . Heart attack Father   . Hypertension Brother   . Ovarian cancer Mother   . Lung cancer Mother   . Colon polyps Sister     (be sure to complete)  Prior to Admission medications   Medication Sig Start Date End Date Taking? Authorizing Provider  aspirin EC 81 MG tablet Take 81 mg by mouth every morning.    Yes Historical Provider, MD  atorvastatin (LIPITOR) 40 MG tablet Take 1 tablet (40 mg total) by mouth daily at 6 PM. 11/12/13  Yes [name], MD  clopidogrel (PLAVIX) 75 MG tablet Take 1 tablet (75 mg total) by mouth daily. 05/02/13 05/02/14 Yes [name], MD  fish oil-omega-3 fatty acids 1000 MG capsule Take 1 g by mouth every morning.    Yes Historical Provider, MD  metoprolol succinate (TOPROL-XL) 50 MG 24 hr tablet Take 50 mg by mouth daily. Take with or immediately following a meal.   Yes Historical Provider, MD   Physical Exam: Filed Vitals:   03/08/14 1530  BP: 137/69  Pulse: 61  Temp:   Resp: 14     General:  alert  Eyes: EOM-I  ENT: no oral ulcers   Neck: supple   Cardiovascular: s1,s2 rrr  Respiratory: CTA BL  Abdomen: soft,nt,nd   Skin: some ecchymosis   Musculoskeletal: no LE edema   Psychiatric: no hallucinations   Neurologic: CN 2-12 intact; motor 5/5 BL symmetric   Labs on Admission:  Basic Metabolic Panel:  Recent Labs Lab 03/08/14 1125  NA 139  K 4.4  CL 104  CO2 23  GLUCOSE 93  BUN 14  CREATININE 0.61  CALCIUM 8.8   Liver Function Tests: No results found for this basename: AST, ALT, ALKPHOS, BILITOT, PROT, ALBUMIN,  in the last 168 hours No results found  for this basename: LIPASE, AMYLASE,  in the last 168 hours No results found for this basename: AMMONIA,  in the last 168 hours CBC:  Recent Labs Lab 03/08/14 1125  WBC 8.7  HGB 15.3*  HCT 45.6  MCV 90.7  PLT 223   Cardiac Enzymes: No results found for this basename: CKTOTAL, CKMB, CKMBINDEX, TROPONINI,  in the last 168 hours  BNP (last 3 results)  Recent Labs  03/08/14 1125  PROBNP 187.3*   CBG: No results found for this basename: GLUCAP,  in the last 168 hours  Radiological Exams on Admission: Ct Head Wo Contrast  03/08/2014   CLINICAL DATA:  Neck pain.  Head pain.  EXAM: CT HEAD WITHOUT CONTRAST  TECHNIQUE: Contiguous axial images were obtained from the base of the skull through the vertex without contrast.  COMPARISON:  CT head 04/29/2009 hand MR brain 04/15/2009.  FINDINGS: Normal appearance of the intracranial structures. No evidence for acute hemorrhage, mass lesion, midline shift, hydrocephalus or large infarct. No acute bony abnormality. Mild chronic bilateral ethmoid sinus disease  IMPRESSION: No acute intracranial abnormality.   Electronically Signed   By: Rolla Flatten M.D.   On: 03/08/2014 13:59   Ct Angio Neck W/cm &/or Wo/cm  03/08/2014   CLINICAL DATA:  53 year old female with pain radiating to the right lateral neck and occiput. Chest pressure. Initial encounter. No known injury.  EXAM: CT ANGIOGRAPHY NECK  TECHNIQUE: Multidetector CT imaging of the neck was performed using the standard protocol during bolus administration of intravenous contrast. Multiplanar CT image reconstructions and MIPs were obtained to evaluate the vascular anatomy. Carotid stenosis measurements (when applicable) are obtained utilizing NASCET criteria, using the distal internal carotid diameter as the denominator.  CONTRAST:  167mL OMNIPAQUE IOHEXOL 350 MG/ML SOLN  COMPARISON:  Head CT without contrast 1249 hr the same day.  FINDINGS: Dependent atelectasis and some paraseptal emphysema in the lung  apices. No superior mediastinal lymphadenopathy. No apical pneumothorax or pleural fluid. No acute findings in the visualized osseous structures of the thorax.  Negative thyroid, larynx, pharynx, parapharyngeal spaces, retropharyngeal space, sublingual space, submandibular glands, parotid glands, and visualized orbits soft tissues. Grossly negative visualized brain parenchyma.  Ethmoid sinus mucosal thickening and opacification partially visible. Other Visualized paranasal sinuses and mastoids are clear. No acute osseous abnormality identified. No cervical lymphadenopathy.  VASCULAR FINDINGS:  Three vessel arch configuration. Minimal arch atherosclerosis. No great vessel origin stenosis.  Dense right subclavian vein venous contrast does partially obscure the right CCA origin and proximal subclavian. Visualized right CCA is normal proximal to the bifurcation. At the bifurcation there is soft plaque affecting the right ICA origin, but resulting in less than 50 % stenosis with respect to the distal vessel (series 515, image 7). Mild tortuosity of the cervical right ICA which otherwise is normal to the skullbase. Visible right  ICA siphon is patent.  Proximal right subclavian partially obscured by venous artifact but patent. Normal right vertebral artery origin. The right vertebral artery is dominant and normal to the skullbase. Intracranially normal right PICA origin is noted and the right vertebral artery supplies the basilar.  Minimal plaque at the left CCA origin without stenosis. Otherwise minimal left CCA plaque. At the left carotid bifurcation there is soft and calcified plaque resulting in less than 50 % stenosis with respect to the distal vessel (series 513, image 107). Mildly tortuous cervical left ICA otherwise. Visible left ICA siphon is patent with mild calcified plaque.  No proximal left subclavian artery stenosis. Diminutive left vertebral artery within normal origin. The left vertebral remains diminutive  throughout the neck but is patent to the skullbase where it terminates in the left PICA.  Review of the MIP images confirms the above findings.  IMPRESSION: 1. No arterial dissection in the neck. 2. Bilateral carotid bifurcation atherosclerosis resulting in less than 50% stenosis at both ICA origins. 3. Dominant right vertebral artery. The left is diminutive and terminates in the left PICA.   Electronically Signed   By: Lars Pinks M.D.   On: 03/08/2014 14:35   Ct Angio Chest Pe W/cm &/or Wo Cm  03/08/2014   CLINICAL DATA:  Shortness of breath.  Chest pain.  EXAM: CT ANGIOGRAPHY CHEST WITH CONTRAST  TECHNIQUE: Multidetector CT imaging of the chest was performed using the standard protocol during bolus administration of intravenous contrast. Multiplanar CT image reconstructions and MIPs were obtained to evaluate the vascular anatomy.  CONTRAST:  126mL OMNIPAQUE IOHEXOL 350 MG/ML SOLN  COMPARISON:  None.  FINDINGS: Satisfactory opacification of pulmonary arteries noted, and no pulmonary emboli identified. No evidence of thoracic aortic dissection or aneurysm. No evidence of mediastinal hematoma or mass.  No lymphadenopathy identified within the thorax. No evidence of pleural or pericardial effusion. No evidence of pulmonary airspace disease or mass. No central endobronchial lesion identified. Mild paraseptal emphysema noted.  Review of the MIP images confirms the above findings.  IMPRESSION: No evidence of pulmonary embolism or other acute findings.   Electronically Signed   By: Earle Gell M.D.   On: 03/08/2014 14:04   Dg Chest Port 1 View  03/08/2014   CLINICAL DATA:  Mid chest discomfort, shortness of breath  EXAM: PORTABLE CHEST - 1 VIEW  COMPARISON:  04/02/2012  FINDINGS: Lungs are clear.  No pleural effusion or pneumothorax.  The heart is normal in size.  IMPRESSION: No evidence of acute cardiopulmonary disease.   Electronically Signed   By: Julian Hy M.D.   On: 03/08/2014 12:38    EKG:  Independently reviewed.   Assessment/Plan Principal Problem:   Chest pain Active Problems:   HTN (hypertension)   Back pain   Left lumbar radiculitis   Postlaminectomy syndrome, lumbar region   53 y.o. female with PMH of PAF, HTN, PAD s/p stenting presented with intermittent substernal chest pressure   1. Chest pain hight risk for underlying CAD due to severe PAD;  -initial ECG, trop unremarkable; cont serial trop, ECG; consulted cardiology; may need LHC vs stress test  2. 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 Severe PAD s/p stent; cont plavix+ASA    4. HTN cont BB  Cardiology;  if consultant consulted, please document name and whether formally or informally consulted  Code Status: full (must indicate code status--if unknown or must  be presumed, indicate so) Family Communication: d/w patient, her husband  (indicate person spoken with, if applicable, with phone number if by telephone) Disposition Plan: home 24-48 hours  (indicate anticipated LOS)  Time spent: >35 minutes   Kinnie Feil [facility] Pager [phone]  If 7PM-7AM, please contact night-coverage [email or web address] Password [facility] 03/08/2014, 3:43 PM

## 2014-03-08 NOTE — ED Provider Notes (Signed)
CSN: [id number]     Arrival date & time 03/08/14  1104 History   First MD Initiated Contact with Patient 03/08/14 1120     Chief Complaint  Patient presents with  . Chest Pain  . Loss of Consciousness     (Consider location/radiation/quality/duration/timing/severity/associated sxs/prior Treatment) HPI Comments: Patient is a 53 year old female the past medical history of paroxysmal age of fibrillation, hypertension, peripheral vascular disease, hyperlipidemia and normal cardiac cath in 2009 who presents to the emergency department with her husband complaining of sudden onset midsternal chest pain beginning when she was laying in bed last night. She describes the pain as if someone was sitting on her chest and was having difficulty catching her breath. This morning when she woke up she had severe radiating right-sided neck pain up into her head which has been constant. Chest pain today is not as bad as yesterday but still present. States she is still having intermittent episodes of shortness of breath. States her neck pain is severe. Her husband states on the way to the emergency department she had 2 syncopal episodes, both of which patient does not remember. Denies numbness, weakness, facial asymmetry, speech changes, vomiting or diaphoresis. She is slightly nauseated. Denies ever having pain like this in the past. Patient also states yesterday she noticed that her right lower leg was bruising. She can feel a "knot" with bruising around it which has spread. She is on Plavix.  Patient is a 53 y.o. female presenting with chest pain and syncope. The history is provided by the patient and the spouse.  Chest Pain Associated symptoms: headache, nausea, shortness of breath and syncope   Loss of Consciousness Associated symptoms: chest pain, headaches, nausea and shortness of breath     Past Medical History  Diagnosis Date  . PAF (paroxysmal atrial fibrillation)   . Hypertension   . Peripheral  vascular disease 06/11/2009    Bilateral common iliac kissing stents (8X24 mm) . 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 She underwent successful balloon angioplasty.  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   . Hyperlipidemia   . Tobacco abuse   . Claudication     clots in legs  . S/P cardiac cath 2009    Normal   Past Surgical History  Procedure Laterality Date  . Cardiac catheterization  2009    with stents  . Lumbar disc surgery      spinal stenosis  . Knee arthroscopy Bilateral    Family History  Problem Relation Age of Onset  . Hypertension Mother   . Heart disease Father   . Heart attack Father   . Hypertension Brother   . Ovarian cancer Mother   . Lung cancer Mother   . Colon polyps Sister    History  Substance Use Topics  . Smoking status: Current Some Day Smoker    Types: Cigarettes  . Smokeless tobacco: Never Used     Comment: electronic cigarette  . Alcohol Use: No   OB History   Grav Para Term Preterm Abortions TAB SAB Ect Mult Living                 Review of Systems  Respiratory: Positive for shortness of breath.   Cardiovascular: Positive for chest pain and syncope.  Gastrointestinal: Positive for nausea.  Musculoskeletal: Positive for neck pain.  Neurological:  Positive for syncope and headaches.  All other systems reviewed and are negative.     Allergies  Pradaxa and Zocor  Home Medications   Prior to Admission medications   Medication Sig Start Date End Date Taking? Authorizing Provider  aspirin EC 81 MG tablet Take 81 mg by mouth every morning.    Yes Historical Provider, MD  atorvastatin (LIPITOR) 40 MG tablet Take 1 tablet (40 mg total) by mouth daily at 6 PM. 11/12/13  Yes [name], MD  clopidogrel (PLAVIX) 75 MG tablet Take 1 tablet (75 mg total) by mouth daily. 05/02/13  05/02/14 Yes [name], MD  fish oil-omega-3 fatty acids 1000 MG capsule Take 1 g by mouth every morning.    Yes Historical Provider, MD  metoprolol succinate (TOPROL-XL) 50 MG 24 hr tablet Take 50 mg by mouth daily. Take with or immediately following a meal.   Yes Historical Provider, MD   BP 167/73  Pulse 66  Temp(Src) 97.9 F (36.6 C) (Oral)  Resp 11  Ht 5\' 6"  (1.676 m)  Wt 185 lb (83.915 kg)  BMI 29.87 kg/m2  SpO2 100% Physical Exam  Nursing note and vitals reviewed. Constitutional: She is oriented to person, place, and time. She appears well-developed and well-nourished. She appears distressed (moderate).  HENT:  Head: Normocephalic and atraumatic.  Mouth/Throat: Oropharynx is clear and moist.  Eyes: Conjunctivae and EOM are normal. Pupils are equal, round, and reactive to light.  Neck: Trachea normal and normal range of motion. Neck supple. Normal carotid pulses and no JVD present. Carotid bruit is not present.    Cardiovascular: Normal rate, regular rhythm, normal heart sounds and intact distal pulses.   No extremity edema.  Pulmonary/Chest: Effort normal and breath sounds normal. No respiratory distress. She exhibits no tenderness.  Abdominal: Soft. Bowel sounds are normal. There is no tenderness.  Musculoskeletal: Normal range of motion. She exhibits no edema.  Bruising and and tenderness to right calf.  Neurological: She is alert and oriented to person, place, and time. She has normal strength. No sensory deficit.  Speech fluent, goal oriented. Moves limbs without ataxia. Equal grip strength bilateral.  Skin: Skin is warm and dry. She is not diaphoretic. There is pallor.  Psychiatric: She has a normal mood and affect. Her behavior is normal.    ED Course  Procedures (including critical care time) Labs Review Labs Reviewed  CBC - Abnormal; Notable for the following:    Hemoglobin 15.3 (*)    All other components within normal limits  PRO B NATRIURETIC  PEPTIDE - Abnormal; Notable for the following:    Pro B Natriuretic peptide (BNP) 187.3 (*)    All other components within normal limits  BASIC METABOLIC PANEL  I-STAT TROPOININ, ED    Imaging Review Ct Head Wo Contrast  03/08/2014   CLINICAL DATA:  Neck pain.  Head pain.  EXAM: CT HEAD WITHOUT CONTRAST  TECHNIQUE: Contiguous axial images were obtained from the base of the skull through the vertex without contrast.  COMPARISON:  CT head 04/29/2009 hand MR brain 04/15/2009.  FINDINGS: Normal appearance of the intracranial structures. No evidence for acute hemorrhage, mass lesion, midline shift, hydrocephalus or large infarct. No acute bony abnormality. Mild chronic bilateral ethmoid sinus disease  IMPRESSION: No acute intracranial abnormality.   Electronically Signed   By: Rolla Flatten M.D.   On: 03/08/2014 13:59   Ct Angio Neck W/cm &/or Wo/cm  03/08/2014   CLINICAL DATA:  53 year old female with  pain radiating to the right lateral neck and occiput. Chest pressure. Initial encounter. No known injury.  EXAM: CT ANGIOGRAPHY NECK  TECHNIQUE: Multidetector CT imaging of the neck was performed using the standard protocol during bolus administration of intravenous contrast. Multiplanar CT image reconstructions and MIPs were obtained to evaluate the vascular anatomy. Carotid stenosis measurements (when applicable) are obtained utilizing NASCET criteria, using the distal internal carotid diameter as the denominator.  CONTRAST:  138mL OMNIPAQUE IOHEXOL 350 MG/ML SOLN  COMPARISON:  Head CT without contrast 1249 hr the same day.  FINDINGS: Dependent atelectasis and some paraseptal emphysema in the lung apices. No superior mediastinal lymphadenopathy. No apical pneumothorax or pleural fluid. No acute findings in the visualized osseous structures of the thorax.  Negative thyroid, larynx, pharynx, parapharyngeal spaces, retropharyngeal space, sublingual space, submandibular glands, parotid glands, and visualized  orbits soft tissues. Grossly negative visualized brain parenchyma.  Ethmoid sinus mucosal thickening and opacification partially visible. Other Visualized paranasal sinuses and mastoids are clear. No acute osseous abnormality identified. No cervical lymphadenopathy.  VASCULAR FINDINGS:  Three vessel arch configuration. Minimal arch atherosclerosis. No great vessel origin stenosis.  Dense right subclavian vein venous contrast does partially obscure the right CCA origin and proximal subclavian. Visualized right CCA is normal proximal to the bifurcation. At the bifurcation there is soft plaque affecting the right ICA origin, but resulting in less than 50 % stenosis with respect to the distal vessel (series 515, image 7). Mild tortuosity of the cervical right ICA which otherwise is normal to the skullbase. Visible right ICA siphon is patent.  Proximal right subclavian partially obscured by venous artifact but patent. Normal right vertebral artery origin. The right vertebral artery is dominant and normal to the skullbase. Intracranially normal right PICA origin is noted and the right vertebral artery supplies the basilar.  Minimal plaque at the left CCA origin without stenosis. Otherwise minimal left CCA plaque. At the left carotid bifurcation there is soft and calcified plaque resulting in less than 50 % stenosis with respect to the distal vessel (series 513, image 107). Mildly tortuous cervical left ICA otherwise. Visible left ICA siphon is patent with mild calcified plaque.  No proximal left subclavian artery stenosis. Diminutive left vertebral artery within normal origin. The left vertebral remains diminutive throughout the neck but is patent to the skullbase where it terminates in the left PICA.  Review of the MIP images confirms the above findings.  IMPRESSION: 1. No arterial dissection in the neck. 2. Bilateral carotid bifurcation atherosclerosis resulting in less than 50% stenosis at both ICA origins. 3. Dominant  right vertebral artery. The left is diminutive and terminates in the left PICA.   Electronically Signed   By: Lars Pinks M.D.   On: 03/08/2014 14:35   Ct Angio Chest Pe W/cm &/or Wo Cm  03/08/2014   CLINICAL DATA:  Shortness of breath.  Chest pain.  EXAM: CT ANGIOGRAPHY CHEST WITH CONTRAST  TECHNIQUE: Multidetector CT imaging of the chest was performed using the standard protocol during bolus administration of intravenous contrast. Multiplanar CT image reconstructions and MIPs were obtained to evaluate the vascular anatomy.  CONTRAST:  184mL OMNIPAQUE IOHEXOL 350 MG/ML SOLN  COMPARISON:  None.  FINDINGS: Satisfactory opacification of pulmonary arteries noted, and no pulmonary emboli identified. No evidence of thoracic aortic dissection or aneurysm. No evidence of mediastinal hematoma or mass.  No lymphadenopathy identified within the thorax. No evidence of pleural or pericardial effusion. No evidence of pulmonary airspace disease or mass. No central  endobronchial lesion identified. Mild paraseptal emphysema noted.  Review of the MIP images confirms the above findings.  IMPRESSION: No evidence of pulmonary embolism or other acute findings.   Electronically Signed   By: Earle Gell M.D.   On: 03/08/2014 14:04   Dg Chest Port 1 View  03/08/2014   CLINICAL DATA:  Mid chest discomfort, shortness of breath  EXAM: PORTABLE CHEST - 1 VIEW  COMPARISON:  04/02/2012  FINDINGS: Lungs are clear.  No pleural effusion or pneumothorax.  The heart is normal in size.  IMPRESSION: No evidence of acute cardiopulmonary disease.   Electronically Signed   By: Julian Hy M.D.   On: 03/08/2014 12:38     EKG Interpretation   Date/Time:  Thursday March 08 2014 11:08:19 EDT Ventricular Rate:  61 PR Interval:  140 QRS Duration: 86 QT Interval:  402 QTC Calculation: 404 R Axis:   58 Text Interpretation:  Normal sinus rhythm Normal ECG No significant change  since last tracing Confirmed by SHELDON  MD, Juanda Crumble [phone]) on  03/08/2014  11:11:59 AM      MDM   Final diagnoses:  Chest pain  Carotidynia    Patient presenting with chest and neck pain, shortness of breath. She appears in moderate distress and uncomfortable. Slightly hypertensive, vitals otherwise stable. EKG normal. Labs pending. Plan to obtain CT angiogram chest and neck along with CT head given patient's symptoms to rule out carotid dissection and PE and/or stroke. 3:01 PM Imaging studies negative for any acute finding. Troponin negative. Patient states her symptoms have somewhat improved, however are intermittent. Given the nature of her symptoms, patient will be admitted for observation and chest pain rule out. Admission accepted by Dr. Daleen Bo, [facility].  Case discussed with attending Dr. Karle Starch who agrees with plan of care.    Illene Labrador, PA-C 03/08/14 1502

## 2014-03-08 NOTE — ED Notes (Signed)
Pt states Morphine made her neck/jaw pain much worse.  Pt also felt more SOB.  O2 sats remained 99%.  O2 via [place] at 2L/min applied for pt comfort.  Pt reports she is feeling better now.

## 2014-03-08 NOTE — ED Notes (Addendum)
Pt and family reports onset of mid chest discomfort and sob since last night. Having nausea, and pain into her neck and jaw. +syncopal episodes pta x 2. ekg done on arrival, airway intact, skin clammy.

## 2014-03-08 NOTE — Consult Note (Signed)
CARDIOLOGY CONSULT NOTE   Patient ID: STEHANIE Hopkins MRN: [id number] DOB/AGE: Mar 09, 1961 53 y.o.  Admit date: 03/08/2014  Primary Physician   No PCP Per Patient Primary Cardiologist   Dr. Fletcher Anon Reason for Consultation   Chest pain   HPI: Kayla Hopkins is a 53 y.o. female with a history of  PAF on ASA, HTN, PAD s/p stenting presented with intermittent substernal chest pressure. The chest pain started last night while lying on her side. She went into atrial fibrillation (per patient report) and then she started to notice chest pressure ( and her heart stopped beating). It was 10/10 pain that comes and goes. It was associated with SOB, diaphoresis and nausea. It is not related to eating and is not worse with deep inspiration. Currently she has 1/10 chest pressure and the feeling like her body will not breath on its own and she has to consciously breath. She also complains of neck pain that feels like a constant throbbing, which is causing her to have a headache. No recent cough or fever. The pain is similar to a previous admission in 2009 when she was admitted and ruled out for myocardial infarction. She continued to have off and on chest pain throughout her hospital course and was referred for cardiac catheterization which revealed a normal left main, normal LAD, normal diagonal, normal ramus intermedius, normal circumflex, and normal RCA. She has not had any problems since that time.    Past Medical History  Diagnosis Date  . PAF (paroxysmal atrial fibrillation)   . Hypertension   . Peripheral vascular disease 06/11/2009    Bilateral common iliac kissing stents (8X24 mm) . 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 She underwent successful balloon angioplasty.  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   . Hyperlipidemia   .  Tobacco abuse   . Claudication     clots in legs  . S/P cardiac cath 2009    Normal     Past Surgical History  Procedure Laterality Date  . Cardiac catheterization  2009    with stents  . Lumbar disc surgery      spinal stenosis  . Knee arthroscopy Bilateral     Allergies  Allergen Reactions  . Pradaxa [Dabigatran Etexilate Mesylate]     Gastritis   . Zocor [Simvastatin]     Leg pains     I have reviewed the patient's current medications . aspirin EC  81 mg Oral q morning - 10a  . atorvastatin  40 mg Oral q1800  . clopidogrel  75 mg Oral Q breakfast  . enoxaparin (LOVENOX) injection  40 mg Subcutaneous Q24H  . metoprolol succinate  50 mg Oral Daily  . [START ON 03/09/2014] omega-3 acid ethyl esters  1 g Oral Daily  . sodium chloride  3 mL Intravenous Q12H     acetaminophen, acetaminophen, ketorolac, ondansetron (ZOFRAN) IV, ondansetron  Prior to Admission medications   Medication Sig Start Date End Date Taking? Authorizing Provider  aspirin EC 81 MG tablet Take 81 mg by mouth every morning.    Yes Historical Provider, MD  atorvastatin (LIPITOR) 40 MG tablet Take 1 tablet (40 mg total) by mouth daily at 6 PM. 11/12/13  Yes [name], MD  clopidogrel (PLAVIX) 75 MG tablet Take 1 tablet (75 mg total) by  mouth daily. 05/02/13 05/02/14 Yes [name], MD  fish oil-omega-3 fatty acids 1000 MG capsule Take 1 g by mouth every morning.    Yes Historical Provider, MD  metoprolol succinate (TOPROL-XL) 50 MG 24 hr tablet Take 50 mg by mouth daily. Take with or immediately following a meal.   Yes Historical Provider, MD     History   Social History  . Marital Status: Single    Spouse Name: N/A    Number of Children: 3  . Years of Education: N/A   Occupational History  . Not on file.   Social History Main Topics  . Smoking status: Current Some Day Smoker    Types: Cigarettes  . Smokeless tobacco: Never Used     Comment: electronic cigarette  . Alcohol Use: No    . Drug Use: No  . Sexual Activity: Yes   Other Topics Concern  . Not on file   Social History Narrative  . No narrative on file    Family Status  Relation Status Death Age  . Mother Alive   . Father Alive   . Sister Alive   . Brother Alive    Family History  Problem Relation Age of Onset  . Hypertension Mother   . Heart disease Father   . Heart attack Father   . Hypertension Brother   . Ovarian cancer Mother   . Lung cancer Mother   . Colon polyps Sister      ROS:  Full 14 point review of systems complete and found to be negative unless listed above.  Physical Exam: Blood pressure 147/69, pulse 69, temperature 97.6 F (36.4 C), temperature source Oral, resp. rate 18, height 5\' 6"  (1.676 m), weight 185 lb (83.915 kg), SpO2 100.00%.  General: Well developed, well nourished, female in no acute distress Head: Eyes PERRLA, No xanthomas.  Normocephalic and atraumatic, oropharynx without edema or exudate. Dentition:  Lungs: CTA Heart: HRRR S1 S2, no rub/gallop, Heart irregular rate and rhythm with S1, S2  murmur. pulses are 2+ extrem.   Neck: No carotid bruits. No lymphadenopathy. No  JVD. Abdomen: Bowel sounds present, abdomen soft and non-tender without masses or hernias noted. Msk:  No spine or cva tenderness. No weakness, no joint deformities or effusions. Extremities: No clubbing or cyanosis.  No edema.  R leg with large hematoma and knot. With minimal swelling.  Neuro: Alert and oriented X 3. No focal deficits noted. Psych:  Good affect, responds appropriately Skin: No rashes or lesions noted.  Labs:   Lab Results  Component Value Date   WBC 8.7 03/08/2014   HGB 15.3* 03/08/2014   HCT 45.6 03/08/2014   MCV 90.7 03/08/2014   PLT 223 03/08/2014     Recent Labs Lab 03/08/14 1125  NA 139  K 4.4  CL 104  CO2 23  BUN 14  CREATININE 0.61  CALCIUM 8.8  GLUCOSE 93    Recent Labs  03/08/14 1131  TROPIPOC 0.00   Pro B Natriuretic peptide (BNP)  Date/Time  Value Ref Range Status  03/08/2014 11:25 AM 187.3* 0 - 125 pg/mL Final    ECG:  NSR  Radiology:  Ct Head Wo Contrast  03/08/2014   CLINICAL DATA:  Neck pain.  Head pain.  EXAM: CT HEAD WITHOUT CONTRAST  TECHNIQUE: Contiguous axial images were obtained from the base of the skull through the vertex without contrast.  COMPARISON:  CT head 04/29/2009 hand MR brain 04/15/2009.  FINDINGS: Normal appearance of the  intracranial structures. No evidence for acute hemorrhage, mass lesion, midline shift, hydrocephalus or large infarct. No acute bony abnormality. Mild chronic bilateral ethmoid sinus disease  IMPRESSION: No acute intracranial abnormality.     Ct Angio Neck W/cm &/or Wo/cm  03/08/2014   CLINICAL DATA:  53 year old female with pain radiating to the right lateral neck and occiput. Chest pressure. Initial encounter. No known injury.  EXAM: CT ANGIOGRAPHY NECK  TECHNIQUE: Multidetector CT imaging of the neck was performed using the standard protocol during bolus administration of intravenous contrast. Multiplanar CT image reconstructions and MIPs were obtained to evaluate the vascular anatomy. Carotid stenosis measurements (when applicable) are obtained utilizing NASCET criteria, using the distal internal carotid diameter as the denominator.  CONTRAST:  182mL OMNIPAQUE IOHEXOL 350 MG/ML SOLN  COMPARISON:  Head CT without contrast 1249 hr the same day.  FINDINGS: Dependent atelectasis and some paraseptal emphysema in the lung apices. No superior mediastinal lymphadenopathy. No apical pneumothorax or pleural fluid. No acute findings in the visualized osseous structures of the thorax.  Negative thyroid, larynx, pharynx, parapharyngeal spaces, retropharyngeal space, sublingual space, submandibular glands, parotid glands, and visualized orbits soft tissues. Grossly negative visualized brain parenchyma.  Ethmoid sinus mucosal thickening and opacification partially visible. Other Visualized paranasal sinuses and  mastoids are clear. No acute osseous abnormality identified. No cervical lymphadenopathy.  VASCULAR FINDINGS:  Three vessel arch configuration. Minimal arch atherosclerosis. No great vessel origin stenosis.  Dense right subclavian vein venous contrast does partially obscure the right CCA origin and proximal subclavian. Visualized right CCA is normal proximal to the bifurcation. At the bifurcation there is soft plaque affecting the right ICA origin, but resulting in less than 50 % stenosis with respect to the distal vessel (series 515, image 7). Mild tortuosity of the cervical right ICA which otherwise is normal to the skullbase. Visible right ICA siphon is patent.  Proximal right subclavian partially obscured by venous artifact but patent. Normal right vertebral artery origin. The right vertebral artery is dominant and normal to the skullbase. Intracranially normal right PICA origin is noted and the right vertebral artery supplies the basilar.  Minimal plaque at the left CCA origin without stenosis. Otherwise minimal left CCA plaque. At the left carotid bifurcation there is soft and calcified plaque resulting in less than 50 % stenosis with respect to the distal vessel (series 513, image 107). Mildly tortuous cervical left ICA otherwise. Visible left ICA siphon is patent with mild calcified plaque.  No proximal left subclavian artery stenosis. Diminutive left vertebral artery within normal origin. The left vertebral remains diminutive throughout the neck but is patent to the skullbase where it terminates in the left PICA.  Review of the MIP images confirms the above findings.  IMPRESSION: 1. No arterial dissection in the neck. 2. Bilateral carotid bifurcation atherosclerosis resulting in less than 50% stenosis at both ICA origins. 3. Dominant right vertebral artery. The left is diminutive and terminates in the left PICA.     Ct Angio Chest Pe W/cm &/or Wo Cm  03/08/2014   CLINICAL DATA:  Shortness of breath.   Chest pain.  EXAM: CT ANGIOGRAPHY CHEST WITH CONTRAST  TECHNIQUE: Multidetector CT imaging of the chest was performed using the standard protocol during bolus administration of intravenous contrast. Multiplanar CT image reconstructions and MIPs were obtained to evaluate the vascular anatomy.  CONTRAST:  143mL OMNIPAQUE IOHEXOL 350 MG/ML SOLN  COMPARISON:  None.  FINDINGS: Satisfactory opacification of pulmonary arteries noted, and no pulmonary emboli identified. No evidence  of thoracic aortic dissection or aneurysm. No evidence of mediastinal hematoma or mass.  No lymphadenopathy identified within the thorax. No evidence of pleural or pericardial effusion. No evidence of pulmonary airspace disease or mass. No central endobronchial lesion identified. Mild paraseptal emphysema noted.  Review of the MIP images confirms the above findings.  IMPRESSION: No evidence of pulmonary embolism or other acute findings.   Dg Chest Port 1 View  03/08/2014   CLINICAL DATA:  Mid chest discomfort, shortness of breath  EXAM: PORTABLE CHEST - 1 VIEW  COMPARISON:  04/02/2012  FINDINGS: Lungs are clear.  No pleural effusion or pneumothorax.  The heart is normal in size.  IMPRESSION: No evidence of acute cardiopulmonary disease.     CARDIAC CATHETERIZATION  DATE OF PROCEDURE: August 13, 2008  Indications for procedure is continued chest pain, multiple cardiac risk  factors, and evaluate for obstructive coronary disease.  PROCEDURE DESCRIPTION: The patient was brought to the cardiac cath lab  after appropriate informed consent. She was prepped and draped in  sterile fashion. Approximately, 10 mL of 1% lidocaine was used for  local anesthesia. A 5-French sheath was placed in the right femoral  artery without difficulty. Coronary angiography, LV angiography was  then performed. The patient tolerated the procedure well and  transferred from the cardiac cath lab in stable condition.  FINDINGS:  1. Left Main: Normal.  2. LAD:  Normal.  3. D1: Normal.  4. Large ramus intermedius: Normal.  5. LCX: Small, nondominant, and normal appearing.  6. RCA: Dominant and normal appearing.  7. LV: EF 65%. No wall motion abnormalities. LVDP is 17 mmHg.  IMPRESSION:  1. Normal-appearing coronary arteries.  2. Preserved left ventricular systolic function, ejection fraction  65%, left ventricular diastolic pressure is 17 mmHg.  PLAN: At this time, I would recommend aggressive risk factor  modification as indicated, and we will continue aspirin and statin  therapy.     ASSESSMENT AND PLAN:    Principal Problem:   Chest pain Active Problems:   HTN (hypertension)   Back pain   Left lumbar radiculitis   Postlaminectomy syndrome, lumbar region  53 y.o. female with PMH of PAF, HTN, PAD s/p stenting presented with intermittent substernal chest pressure.   Chest pain high risk for underlying CAD due to severe PAD. LHC with no CAD in 2009, Lexiscan myoview in 2013 normal.  --Troponin x1 negative, ECG with no acute ST or TW changes --Will admit to telemtry for observation --Cycle Troponin and serial ECGs -- Lexiscan myovue in morning if rules out. NPO after midnight. 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 Reports an episode last night. ?? -- Not on anticoagulation - unsure why?   Signed: Perry Mount, PA-C 03/08/2014 4:27 PM  Pager [phone]  Co-Sign MD

## 2014-03-08 NOTE — ED Provider Notes (Signed)
Medical screening examination/treatment/procedure(s) were performed by non-physician practitioner and as supervising physician I was immediately available for consultation/collaboration.   EKG Interpretation   Date/Time:  Thursday March 08 2014 11:08:19 EDT Ventricular Rate:  61 PR Interval:  140 QRS Duration: 86 QT Interval:  402 QTC Calculation: 404 R Axis:   58 Text Interpretation:  Normal sinus rhythm Normal ECG No significant change  since last tracing Confirmed by [facility]  MD, CHARLES [phone]) on 03/08/2014  11:11:59 AM        Juanda Crumble B. Karle Starch, MD 03/08/14 [phone]

## 2014-03-09 ENCOUNTER — Encounter (HOSPITAL_COMMUNITY): Payer: Medicare Other

## 2014-03-09 ENCOUNTER — Observation Stay (HOSPITAL_COMMUNITY): Payer: Medicare Other

## 2014-03-09 DIAGNOSIS — R238 Other skin changes: Secondary | ICD-10-CM

## 2014-03-09 DIAGNOSIS — R0789 Other chest pain: Secondary | ICD-10-CM | POA: Diagnosis present

## 2014-03-09 DIAGNOSIS — I517 Cardiomegaly: Secondary | ICD-10-CM

## 2014-03-09 DIAGNOSIS — R079 Chest pain, unspecified: Secondary | ICD-10-CM

## 2014-03-09 DIAGNOSIS — M79609 Pain in unspecified limb: Secondary | ICD-10-CM

## 2014-03-09 LAB — TROPONIN I

## 2014-03-09 MED ORDER — TECHNETIUM TC 99M SESTAMIBI GENERIC - CARDIOLITE
10.0000 | Freq: Once | INTRAVENOUS | Status: AC | PRN
  Administered 2014-03-09: 10 via INTRAVENOUS

## 2014-03-09 MED ORDER — ACETAMINOPHEN 325 MG PO TABS: 650.0000 mg | ORAL_TABLET | Freq: Four times a day (QID) | ORAL | Status: DC | PRN

## 2014-03-09 MED ORDER — TECHNETIUM TC 99M TETROFOSMIN IV KIT
30.0000 | PACK | Freq: Once | INTRAVENOUS | Status: AC | PRN
  Administered 2014-03-09: 30 via INTRAVENOUS

## 2014-03-09 MED ORDER — SODIUM CHLORIDE 0.9 % IJ SOLN
80.0000 mg | INTRAVENOUS | Status: AC
  Administered 2014-03-09: 80 mg via INTRAVENOUS

## 2014-03-09 MED ORDER — REGADENOSON 0.4 MG/5ML IV SOLN
INTRAVENOUS | Status: AC
  Administered 2014-03-09: 0.4 mg
  Filled 2014-03-09: qty 5

## 2014-03-09 NOTE — Progress Notes (Signed)
Subjective: + headache, no chest pain  Objective: Vital signs in last 24 hours: Temp:  [97.8 F (36.6 C)-98 F (36.7 C)] 98 F (36.7 C) (05/01 1508) Pulse Rate:  [57-107] 63 (05/01 1508) Resp:  [16-18] 18 (05/01 1508) BP: (119-179)/(61-108) 119/61 mmHg (05/01 1508) SpO2:  [97 %-100 %] 99 % (05/01 1508) Weight change:  Last BM Date: 03/06/14 Intake/Output from previous day: 04/30 0701 - 05/01 0700 In: 0  Out: 500 [Urine:500] Intake/Output this shift: Total I/O In: 0  Out: 500 [Urine:500]  PE: General:Pleasant affect, NAD Skin:Warm and dry, brisk capillary refill HEENT:normocephalic, sclera clear, mucus membranes moist Heart:S1S2 RRR with soft murmur, gallup, rub or click Lungs:clear without rales, rhonchi, or wheezes KZL:DJTT, non tender, + BS, do not palpate liver spleen or masses Ext:no lower ext edema, 2+ pedal pulses, 2+ radial pulses Neuro:alert and oriented, MAE, follows commands, + facial symmetry   Lab Results:  Recent Labs  03/08/14 1125 03/08/14 1920  WBC 8.7 8.3  HGB 15.3* 13.6  HCT 45.6 41.6  PLT 223 197   BMET  Recent Labs  03/08/14 1125 03/08/14 1920  NA 139  --   K 4.4  --   CL 104  --   CO2 23  --   GLUCOSE 93  --   BUN 14  --   CREATININE 0.61 0.75  CALCIUM 8.8  --     Recent Labs  03/08/14 2217 03/09/14 0420  TROPONINI <0.30 <0.30    Lab Results  Component Value Date   CHOL 188 06/08/2012   HDL 46.00 06/08/2012   LDLCALC  Value: 147        Total Cholesterol/HDL:CHD Risk Coronary Heart Disease Risk Table                     Men   Women  1/2 Average Risk   3.4   3.3* 08/11/2008   LDLDIRECT 77.8 06/08/2012   TRIG 474.0* 06/08/2012   CHOLHDL 4 06/08/2012   No results found for this basename: HGBA1C     Lab Results  Component Value Date   TSH 0.90 10/24/2013     Studies/Results: Ct Head Wo Contrast  03/08/2014   CLINICAL DATA:  Neck pain.  Head pain.  EXAM: CT HEAD WITHOUT CONTRAST  TECHNIQUE: Contiguous axial images  were obtained from the base of the skull through the vertex without contrast.  COMPARISON:  CT head 04/29/2009 hand MR brain 04/15/2009.  FINDINGS: Normal appearance of the intracranial structures. No evidence for acute hemorrhage, mass lesion, midline shift, hydrocephalus or large infarct. No acute bony abnormality. Mild chronic bilateral ethmoid sinus disease  IMPRESSION: No acute intracranial abnormality.   Electronically Signed   By: Rolla Flatten M.D.   On: 03/08/2014 13:59   Ct Angio Neck W/cm &/or Wo/cm  03/08/2014   CLINICAL DATA:  53 year old female with pain radiating to the right lateral neck and occiput. Chest pressure. Initial encounter. No known injury.  EXAM: CT ANGIOGRAPHY NECK  TECHNIQUE: Multidetector CT imaging of the neck was performed using the standard protocol during bolus administration of intravenous contrast. Multiplanar CT image reconstructions and MIPs were obtained to evaluate the vascular anatomy. Carotid stenosis measurements (when applicable) are obtained utilizing NASCET criteria, using the distal internal carotid diameter as the denominator.  CONTRAST:  114mL OMNIPAQUE IOHEXOL 350 MG/ML SOLN  COMPARISON:  Head CT without contrast 1249 hr the same day.  FINDINGS: Dependent atelectasis and some paraseptal emphysema in the lung  apices. No superior mediastinal lymphadenopathy. No apical pneumothorax or pleural fluid. No acute findings in the visualized osseous structures of the thorax.  Negative thyroid, larynx, pharynx, parapharyngeal spaces, retropharyngeal space, sublingual space, submandibular glands, parotid glands, and visualized orbits soft tissues. Grossly negative visualized brain parenchyma.  Ethmoid sinus mucosal thickening and opacification partially visible. Other Visualized paranasal sinuses and mastoids are clear. No acute osseous abnormality identified. No cervical lymphadenopathy.  VASCULAR FINDINGS:  Three vessel arch configuration. Minimal arch atherosclerosis. No  great vessel origin stenosis.  Dense right subclavian vein venous contrast does partially obscure the right CCA origin and proximal subclavian. Visualized right CCA is normal proximal to the bifurcation. At the bifurcation there is soft plaque affecting the right ICA origin, but resulting in less than 50 % stenosis with respect to the distal vessel (series 515, image 7). Mild tortuosity of the cervical right ICA which otherwise is normal to the skullbase. Visible right ICA siphon is patent.  Proximal right subclavian partially obscured by venous artifact but patent. Normal right vertebral artery origin. The right vertebral artery is dominant and normal to the skullbase. Intracranially normal right PICA origin is noted and the right vertebral artery supplies the basilar.  Minimal plaque at the left CCA origin without stenosis. Otherwise minimal left CCA plaque. At the left carotid bifurcation there is soft and calcified plaque resulting in less than 50 % stenosis with respect to the distal vessel (series 513, image 107). Mildly tortuous cervical left ICA otherwise. Visible left ICA siphon is patent with mild calcified plaque.  No proximal left subclavian artery stenosis. Diminutive left vertebral artery within normal origin. The left vertebral remains diminutive throughout the neck but is patent to the skullbase where it terminates in the left PICA.  Review of the MIP images confirms the above findings.  IMPRESSION: 1. No arterial dissection in the neck. 2. Bilateral carotid bifurcation atherosclerosis resulting in less than 50% stenosis at both ICA origins. 3. Dominant right vertebral artery. The left is diminutive and terminates in the left PICA.   Electronically Signed   By: Lars Pinks M.D.   On: 03/08/2014 14:35   Ct Angio Chest Pe W/cm &/or Wo Cm  03/08/2014   CLINICAL DATA:  Shortness of breath.  Chest pain.  EXAM: CT ANGIOGRAPHY CHEST WITH CONTRAST  TECHNIQUE: Multidetector CT imaging of the chest was  performed using the standard protocol during bolus administration of intravenous contrast. Multiplanar CT image reconstructions and MIPs were obtained to evaluate the vascular anatomy.  CONTRAST:  150mL OMNIPAQUE IOHEXOL 350 MG/ML SOLN  COMPARISON:  None.  FINDINGS: Satisfactory opacification of pulmonary arteries noted, and no pulmonary emboli identified. No evidence of thoracic aortic dissection or aneurysm. No evidence of mediastinal hematoma or mass.  No lymphadenopathy identified within the thorax. No evidence of pleural or pericardial effusion. No evidence of pulmonary airspace disease or mass. No central endobronchial lesion identified. Mild paraseptal emphysema noted.  Review of the MIP images confirms the above findings.  IMPRESSION: No evidence of pulmonary embolism or other acute findings.   Electronically Signed   By: Earle Gell M.D.   On: 03/08/2014 14:04   Nm Myocar Multi W/spect W/wall Motion / Ef  03/09/2014   CLINICAL DATA:  Chest pain  EXAM: Lexiscan Myovue  TECHNIQUE: The patient received IV Lexiscan .4mg  over 15 seconds. 33.0 mCi of Technetium 20m Sestamibi injected at 30 seconds. Quantitative SPECT images were obtained in the vertical, horizontal and short axis planes after a 45 minute  delay. Rest images were obtained with similar planes and delay using 10.2 mCi of Technetium 41m Sestamibi.  FINDINGS: ECG:  No ischemic ECG changes  Quantitiative Gated SPECT EF: 78%. Gated images showed normal wall motion.  Perfusion Images: There was a small, mild apical lateral perfusion defect seen both at rest and Lexiscan stress. There was prominent breast shadowing.  IMPRESSION: 1. Fixed, small, mild apical lateral perfusion defect. This likely represents breast attenuation. No ischemia. I think this is a normal study.  2.  Normal EF and wall motion.  Kayla Hopkins   Electronically Signed   By: Loralie Champagne M.D.   On: 03/09/2014 15:50   Dg Chest Port 1 View  03/08/2014   CLINICAL DATA:  Mid chest  discomfort, shortness of breath  EXAM: PORTABLE CHEST - 1 VIEW  COMPARISON:  04/02/2012  FINDINGS: Lungs are clear.  No pleural effusion or pneumothorax.  The heart is normal in size.  IMPRESSION: No evidence of acute cardiopulmonary disease.   Electronically Signed   By: Julian Hy M.D.   On: 03/08/2014 12:38    Medications: I have reviewed the patient's current medications. Scheduled Meds: . aspirin EC  81 mg Oral Q2000  . atorvastatin  40 mg Oral Q2000  . clopidogrel  75 mg Oral Q2000  . enoxaparin (LOVENOX) injection  40 mg Subcutaneous Q24H  . metoprolol succinate  50 mg Oral Daily  . omega-3 acid ethyl esters  1 g Oral Daily  . pneumococcal 23 valent vaccine  0.5 mL Intramuscular Tomorrow-1000  . sodium chloride  3 mL Intravenous Q12H   Continuous Infusions:  PRN Meds:.acetaminophen, acetaminophen, ketorolac, nitroGLYCERIN, ondansetron (ZOFRAN) IV, ondansetron, oxyCODONE  Assessment/Plan: Principal Problem:   Chest pain- negative MI, for Lexiscan myoview today, negative PE by CTA Active Problems:   HTN (hypertension)   Back pain   Left lumbar radiculitis   Postlaminectomy syndrome, lumbar region    LOS: 1 day   Time spent with pt. :15 minutes. Cecilie Kicks  Nurse Practitioner Certified Pager [phone] or after 5pm and on weekends call [phone] 03/09/2014, 5:11 PM  Pt seen & Examined this PM after Myoview; Agree with Ms. Dorene Ar' findings, exam & rec's -- no Ischemia or Infarction; LE doppler not yet read, but prelim report indicates no clot.  Non-cardiac CP => ok for d/c from Cardiac standpoint. For RLE bruise -- hold ASA x 1 wk, apply warm compress ~3-4 x daily (for ~15-20 min) to help resorption.  Leonie Man, M.D., M.S. Interventional Cardiologist  [place] Pager # [phone] 03/09/2014

## 2014-03-09 NOTE — Progress Notes (Signed)
UR Completed.  Kayla Hopkins Jane Nial Hawe [phone] 03/09/2014  

## 2014-03-09 NOTE — Progress Notes (Signed)
VASCULAR LAB PRELIMINARY  PRELIMINARY  PRELIMINARY  PRELIMINARY  Right lower extremity venous duplex completed.    Preliminary report:  Right:  No evidence of DVT, superficial thrombosis, or Baker's cyst.  Kayla Hopkins, [place] 03/09/2014, 2:14 PM

## 2014-03-09 NOTE — Progress Notes (Signed)
Lexiscan myoview completed without complications.  + chest pain and extremity pain, improved in recovery and aminophylline.  Nuc results to follow.

## 2014-03-09 NOTE — Discharge Summary (Signed)
Physician Discharge Summary  Kayla Hopkins TDV:[id number] DOB: 10-Feb-1961 DOA: 03/08/2014  PCP: No PCP Per Patient  Admit date: 03/08/2014 Discharge date: 03/09/2014  Discharge Diagnoses:  Principal Problem:   Chest pain Active Problems:   HTN (hypertension)   Back pain   Left lumbar radiculitis   Postlaminectomy syndrome, lumbar region   Discharge Condition: stable  Filed Weights   03/08/14 1109 03/08/14 1623  Weight: 83.915 kg (185 lb) 83.915 kg (185 lb)    History of present illness:  53 y.o. female with PMH of PAF, HTN,PAD s/p stenting presented with intermittent substernal chest pressure responded to [facility] NTG in ED; she also reports associated with SOB, DOE and neck pain; denies cough, no fever, no n/v/d   Hospital Course:  Admitted to hospitalists. Cardiology consulted. MI ruled out.  Stress myoview low risk, likely breast attenuation. Echo normal EF and wall motion. No further pain.  Procedures:  none  Consultations:  cardiology  Discharge Exam: Filed Vitals:   03/09/14 1508  BP: 119/61  Pulse: 63  Temp: 98 F (36.7 C)  Resp: 18    General: comfortable Cardiovascular: RRR Respiratory: CTA  Discharge Instructions You were cared for by a hospitalist during your hospital stay. If you have any questions about your discharge medications or the care you received while you were in the hospital after you are discharged, you can call the unit and asked to speak with the hospitalist on call if the hospitalist that took care of you is not available. Once you are discharged, your primary care physician will handle any further medical issues. Please note that NO REFILLS for any discharge medications will be authorized once you are discharged, as it is imperative that you return to your primary care physician (or establish a relationship with a primary care physician if you do not have one) for your aftercare needs so that they can reassess your need for medications and  monitor your lab values.  Discharge Orders   Future Orders Complete By Expires   Activity as tolerated - No restrictions  As directed    Bed rest  As directed    Diet - low sodium heart healthy  As directed    Increase activity slowly  As directed        Medication List         acetaminophen 325 MG tablet  Commonly known as:  TYLENOL  Take 2 tablets (650 mg total) by mouth every 6 (six) hours as needed for mild pain (or Fever >/= 101).     aspirin EC 81 MG tablet  Take 81 mg by mouth every morning.     atorvastatin 40 MG tablet  Commonly known as:  LIPITOR  Take 1 tablet (40 mg total) by mouth daily at 6 PM.     clopidogrel 75 MG tablet  Commonly known as:  PLAVIX  Take 1 tablet (75 mg total) by mouth daily.     fish oil-omega-3 fatty acids 1000 MG capsule  Take 1 g by mouth every morning.     metoprolol succinate 50 MG 24 hr tablet  Commonly known as:  TOPROL-XL  Take 50 mg by mouth daily. Take with or immediately following a meal.       Allergies  Allergen Reactions  . Pradaxa [Dabigatran Etexilate Mesylate]     Gastritis   . Zocor [Simvastatin]     Leg pains        Follow-up Information   Follow up with primary  care provider of choice In 2 weeks.       The results of significant diagnostics from this hospitalization (including imaging, microbiology, ancillary and laboratory) are listed below for reference.    Significant Diagnostic Studies: Ct Head Wo Contrast  03/08/2014   CLINICAL DATA:  Neck pain.  Head pain.  EXAM: CT HEAD WITHOUT CONTRAST  TECHNIQUE: Contiguous axial images were obtained from the base of the skull through the vertex without contrast.  COMPARISON:  CT head 04/29/2009 hand MR brain 04/15/2009.  FINDINGS: Normal appearance of the intracranial structures. No evidence for acute hemorrhage, mass lesion, midline shift, hydrocephalus or large infarct. No acute bony abnormality. Mild chronic bilateral ethmoid sinus disease  IMPRESSION: No  acute intracranial abnormality.   Electronically Signed   By: Rolla Flatten M.D.   On: 03/08/2014 13:59   Ct Angio Neck W/cm &/or Wo/cm  03/08/2014   CLINICAL DATA:  53 year old female with pain radiating to the right lateral neck and occiput. Chest pressure. Initial encounter. No known injury.  EXAM: CT ANGIOGRAPHY NECK  TECHNIQUE: Multidetector CT imaging of the neck was performed using the standard protocol during bolus administration of intravenous contrast. Multiplanar CT image reconstructions and MIPs were obtained to evaluate the vascular anatomy. Carotid stenosis measurements (when applicable) are obtained utilizing NASCET criteria, using the distal internal carotid diameter as the denominator.  CONTRAST:  150mL OMNIPAQUE IOHEXOL 350 MG/ML SOLN  COMPARISON:  Head CT without contrast 1249 hr the same day.  FINDINGS: Dependent atelectasis and some paraseptal emphysema in the lung apices. No superior mediastinal lymphadenopathy. No apical pneumothorax or pleural fluid. No acute findings in the visualized osseous structures of the thorax.  Negative thyroid, larynx, pharynx, parapharyngeal spaces, retropharyngeal space, sublingual space, submandibular glands, parotid glands, and visualized orbits soft tissues. Grossly negative visualized brain parenchyma.  Ethmoid sinus mucosal thickening and opacification partially visible. Other Visualized paranasal sinuses and mastoids are clear. No acute osseous abnormality identified. No cervical lymphadenopathy.  VASCULAR FINDINGS:  Three vessel arch configuration. Minimal arch atherosclerosis. No great vessel origin stenosis.  Dense right subclavian vein venous contrast does partially obscure the right CCA origin and proximal subclavian. Visualized right CCA is normal proximal to the bifurcation. At the bifurcation there is soft plaque affecting the right ICA origin, but resulting in less than 50 % stenosis with respect to the distal vessel (series 515, image 7). Mild  tortuosity of the cervical right ICA which otherwise is normal to the skullbase. Visible right ICA siphon is patent.  Proximal right subclavian partially obscured by venous artifact but patent. Normal right vertebral artery origin. The right vertebral artery is dominant and normal to the skullbase. Intracranially normal right PICA origin is noted and the right vertebral artery supplies the basilar.  Minimal plaque at the left CCA origin without stenosis. Otherwise minimal left CCA plaque. At the left carotid bifurcation there is soft and calcified plaque resulting in less than 50 % stenosis with respect to the distal vessel (series 513, image 107). Mildly tortuous cervical left ICA otherwise. Visible left ICA siphon is patent with mild calcified plaque.  No proximal left subclavian artery stenosis. Diminutive left vertebral artery within normal origin. The left vertebral remains diminutive throughout the neck but is patent to the skullbase where it terminates in the left PICA.  Review of the MIP images confirms the above findings.  IMPRESSION: 1. No arterial dissection in the neck. 2. Bilateral carotid bifurcation atherosclerosis resulting in less than 50% stenosis at both ICA origins.  3. Dominant right vertebral artery. The left is diminutive and terminates in the left PICA.   Electronically Signed   By: Lars Pinks M.D.   On: 03/08/2014 14:35   Ct Angio Chest Pe W/cm &/or Wo Cm  03/08/2014   CLINICAL DATA:  Shortness of breath.  Chest pain.  EXAM: CT ANGIOGRAPHY CHEST WITH CONTRAST  TECHNIQUE: Multidetector CT imaging of the chest was performed using the standard protocol during bolus administration of intravenous contrast. Multiplanar CT image reconstructions and MIPs were obtained to evaluate the vascular anatomy.  CONTRAST:  164mL OMNIPAQUE IOHEXOL 350 MG/ML SOLN  COMPARISON:  None.  FINDINGS: Satisfactory opacification of pulmonary arteries noted, and no pulmonary emboli identified. No evidence of thoracic  aortic dissection or aneurysm. No evidence of mediastinal hematoma or mass.  No lymphadenopathy identified within the thorax. No evidence of pleural or pericardial effusion. No evidence of pulmonary airspace disease or mass. No central endobronchial lesion identified. Mild paraseptal emphysema noted.  Review of the MIP images confirms the above findings.  IMPRESSION: No evidence of pulmonary embolism or other acute findings.   Electronically Signed   By: Earle Gell M.D.   On: 03/08/2014 14:04   Nm Myocar Multi W/spect W/wall Motion / Ef  03/09/2014   CLINICAL DATA:  Chest pain  EXAM: Lexiscan Myovue  TECHNIQUE: The patient received IV Lexiscan .4mg  over 15 seconds. 33.0 mCi of Technetium 73m Sestamibi injected at 30 seconds. Quantitative SPECT images were obtained in the vertical, horizontal and short axis planes after a 45 minute delay. Rest images were obtained with similar planes and delay using 10.2 mCi of Technetium 64m Sestamibi.  FINDINGS: ECG:  No ischemic ECG changes  Quantitiative Gated SPECT EF: 78%. Gated images showed normal wall motion.  Perfusion Images: There was a small, mild apical lateral perfusion defect seen both at rest and Lexiscan stress. There was prominent breast shadowing.  IMPRESSION: 1. Fixed, small, mild apical lateral perfusion defect. This likely represents breast attenuation. No ischemia. I think this is a normal study.  2.  Normal EF and wall motion.  Dalton Mclean   Electronically Signed   By: Loralie Champagne M.D.   On: 03/09/2014 15:50   Dg Chest Port 1 View  03/08/2014   CLINICAL DATA:  Mid chest discomfort, shortness of breath  EXAM: PORTABLE CHEST - 1 VIEW  COMPARISON:  04/02/2012  FINDINGS: Lungs are clear.  No pleural effusion or pneumothorax.  The heart is normal in size.  IMPRESSION: No evidence of acute cardiopulmonary disease.   Electronically Signed   By: Julian Hy M.D.   On: 03/08/2014 12:38    Microbiology: No results found for this or any previous  visit (from the past 240 hour(s)).   Labs: Basic Metabolic Panel:  Recent Labs Lab 03/08/14 1125 03/08/14 1920  NA 139  --   K 4.4  --   CL 104  --   CO2 23  --   GLUCOSE 93  --   BUN 14  --   CREATININE 0.61 0.75  CALCIUM 8.8  --    Liver Function Tests: No results found for this basename: AST, ALT, ALKPHOS, BILITOT, PROT, ALBUMIN,  in the last 168 hours No results found for this basename: LIPASE, AMYLASE,  in the last 168 hours No results found for this basename: AMMONIA,  in the last 168 hours CBC:  Recent Labs Lab 03/08/14 1125 03/08/14 1920  WBC 8.7 8.3  HGB 15.3* 13.6  HCT 45.6 41.6  MCV 90.7 91.8  PLT 223 197   Cardiac Enzymes:  Recent Labs Lab 03/08/14 1920 03/08/14 2217 03/09/14 0420  TROPONINI <0.30 <0.30 <0.30   BNP: BNP (last 3 results)  Recent Labs  03/08/14 1125  PROBNP 187.3*   CBG: No results found for this basename: GLUCAP,  in the last 168 hours     Signed:  [name], MD  [facility] 03/09/2014, 4:32 PM

## 2014-03-09 NOTE — Progress Notes (Signed)
*  PRELIMINARY RESULTS* Echocardiogram 2D Echocardiogram has been performed.  Elvia Collum 03/09/2014, 2:00 PM

## 2014-03-09 NOTE — Progress Notes (Signed)
Will await results. See full PN.  Leonie Man, MD

## 2014-05-17 ENCOUNTER — Other Ambulatory Visit: Payer: Self-pay

## 2014-05-17 MED ORDER — CLOPIDOGREL BISULFATE 75 MG PO TABS: 75.0000 mg | ORAL_TABLET | Freq: Every day | ORAL | Status: DC

## 2014-06-25 ENCOUNTER — Other Ambulatory Visit: Payer: Self-pay | Admitting: *Deleted

## 2014-06-25 MED ORDER — METOPROLOL SUCCINATE ER 50 MG PO TB24: 50.0000 mg | ORAL_TABLET | Freq: Every day | ORAL | Status: DC

## 2014-06-25 MED ORDER — ATORVASTATIN CALCIUM 40 MG PO TABS: 40.0000 mg | ORAL_TABLET | Freq: Every day | ORAL | Status: DC

## 2014-06-26 ENCOUNTER — Encounter: Payer: Self-pay | Admitting: Cardiovascular Disease

## 2014-06-26 ENCOUNTER — Ambulatory Visit (INDEPENDENT_AMBULATORY_CARE_PROVIDER_SITE_OTHER): Payer: Medicare Other | Admitting: Cardiovascular Disease

## 2014-06-26 VITALS — BP 120/84 | HR 70 | Ht 66.0 in | Wt 183.2 lb

## 2014-06-26 DIAGNOSIS — E785 Hyperlipidemia, unspecified: Secondary | ICD-10-CM

## 2014-06-26 DIAGNOSIS — I10 Essential (primary) hypertension: Secondary | ICD-10-CM

## 2014-06-26 DIAGNOSIS — I739 Peripheral vascular disease, unspecified: Secondary | ICD-10-CM

## 2014-06-26 NOTE — Patient Instructions (Signed)
Your physician has recommended an aorto-iliac duplex.  Your physician wants you to follow-up in: 6 MONTHS with Dr Fletcher Anon.  You will receive a reminder letter in the mail two months in advance. If you don't receive a letter, please call our office to schedule the follow-up appointment.  Your physician recommends that you continue on your current medications as directed. Please refer to the Current Medication list given to you today.

## 2014-06-26 NOTE — Assessment & Plan Note (Signed)
She Is doing well overall with no significant claudication. Continue medical therapy. She is due for an aortoiliac duplex.

## 2014-06-26 NOTE — Assessment & Plan Note (Signed)
Lab Results  Component Value Date   CHOL 188 06/08/2012   HDL 46.00 06/08/2012   LDLCALC  Value: 147        Total Cholesterol/HDL:CHD Risk Coronary Heart Disease Risk Table                     Men   Women  1/2 Average Risk   3.4   3.3* 08/11/2008   LDLDIRECT 77.8 06/08/2012   TRIG 474.0* 06/08/2012   CHOLHDL 4 06/08/2012   Continue treatment with atorvastatin. She is due for a lipid profile.

## 2014-06-26 NOTE — Progress Notes (Signed)
HPI  Kayla Hopkins is a 53 year old female who is here today for a followup visit.  She has no known CAD per cath back in 2009. She does have known history of peripheral arterial disease, paroxysmal atrial fibrillation, hypertension and hyperlipidemia.  She is followed for bilateral iliac stenting. She underwent bilateral kissing stent placement with an 8 x 24 mm Genesis stent in 2010.  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 She had successful balloon angioplasty without significant residual stenosis. She develops recurrent in-stent restenosis. This was treated with a covered stent placement. There was moderate stenosis in distal left common iliac artery at that time.  She was seen for severe left hip and thigh claudication in 04/2013.  ABI significantly dropped on the left to 0.61 with evidence of severe stenosis in distal left common iliac artery.  She underwent angiography which showed 70% stenosis in the distal left common iliac artery just before the origin of the internal iliac artery. There was greater than 40 mm systolic gradient across the lesion. Thus, this was treated with a self-expanding stent placement without complications.   She was hospitalized in April for palpitations and atypical chest pain. Cardiac workup was negative. She felt that her symptoms were due to electronic cigarettes. She quit smoking completely since then and has been doing better. She denies claudication. She continues to have chronic back discomfort. She is using electronic cigarettes with nicotine.   Allergies  Allergen Reactions  . Pradaxa [Dabigatran Etexilate Mesylate]     Gastritis   . Zocor [Simvastatin]     Leg pains      Current Outpatient Prescriptions on File Prior to Visit  Medication Sig Dispense Refill  . acetaminophen (TYLENOL) 325 MG tablet Take 2 tablets (650 mg total) by mouth every 6 (six)  hours as needed for mild pain (or Fever >/= 101).      [name] aspirin EC 81 MG tablet Take 81 mg by mouth every morning.       [name] atorvastatin (LIPITOR) 40 MG tablet Take 1 tablet (40 mg total) by mouth daily at 6 PM.  30 tablet  0  . clopidogrel (PLAVIX) 75 MG tablet Take 1 tablet (75 mg total) by mouth daily.  90 tablet  3  . fish oil-omega-3 fatty acids 1000 MG capsule Take 1 g by mouth every morning.       . metoprolol succinate (TOPROL-XL) 50 MG 24 hr tablet Take 1 tablet (50 mg total) by mouth daily. Take with or immediately following a meal.  30 tablet  0   No current facility-administered medications on file prior to visit.     Past Medical History  Diagnosis Date  . PAF (paroxysmal atrial fibrillation)   . Hypertension   . Peripheral vascular disease 06/11/2009    Bilateral common iliac kissing stents (8X24 mm) . 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 She underwent successful balloon angioplasty.  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   . Hyperlipidemia   . Tobacco abuse   . Claudication     clots in legs  . S/P cardiac cath 2009    Normal     Past Surgical History  Procedure Laterality Date  . Cardiac catheterization  2009    with stents  .  Lumbar disc surgery      spinal stenosis  . Knee arthroscopy Bilateral      Family History  Problem Relation Age of Onset  . Hypertension Mother   . Heart disease Father   . Heart attack Father   . Hypertension Brother   . Ovarian cancer Mother   . Lung cancer Mother   . Colon polyps Sister      History   Social History  . Marital Status: Single    Spouse Name: N/A    Number of Children: 3  . Years of Education: N/A   Occupational History  . Not on file.   Social History Main Topics  . Smoking status: Current Some Day Smoker    Types: Cigarettes  .  Smokeless tobacco: Never Used     Comment: electronic cigarette  . Alcohol Use: No  . Drug Use: No  . Sexual Activity: Yes   Other Topics Concern  . Not on file   Social History Narrative  . No narrative on file       PHYSICAL EXAM   BP 120/84  Pulse 70  Ht 5\' 6"  (1.676 m)  Wt 183 lb 3.2 oz (83.099 kg)  BMI 29.58 kg/m2 Constitutional: She is oriented to person, place, and time. She appears well-developed and well-nourished. No distress.  HENT: No nasal discharge.  Head: Normocephalic and atraumatic.  Eyes: Pupils are equal and round. Right eye exhibits no discharge. Left eye exhibits no discharge.  Neck: Normal range of motion. Neck supple. No JVD present. No thyromegaly present.  Cardiovascular: Normal rate, regular rhythm, normal heart sounds. Exam reveals no gallop and no friction rub. No murmur heard.  Pulmonary/Chest: Effort normal and breath sounds normal. No stridor. No respiratory distress. She has no wheezes. She has no rales. She exhibits no tenderness.  Abdominal: Soft. Bowel sounds are normal. She exhibits no distension. There is no tenderness. There is no rebound and no guarding.  Musculoskeletal: Normal range of motion. She exhibits no edema and no tenderness.  Neurological: She is alert and oriented to person, place, and time. Coordination normal.  Skin: Skin is warm and dry. No rash noted. She is not diaphoretic. No erythema. No pallor.  Psychiatric: She has a normal mood and affect. Her behavior is normal. Judgment and thought content normal.  Vascular: Femoral pulses:  very diminished on both sides. Distal pulses are palpable on the left but not the right.    ASSESSMENT AND PLAN

## 2014-06-26 NOTE — Assessment & Plan Note (Signed)
Blood pressure is well controlled on current medications. 

## 2014-07-03 ENCOUNTER — Ambulatory Visit (HOSPITAL_COMMUNITY): Payer: Medicare Other | Attending: Cardiovascular Disease | Admitting: Cardiology

## 2014-07-03 ENCOUNTER — Ambulatory Visit (INDEPENDENT_AMBULATORY_CARE_PROVIDER_SITE_OTHER): Payer: Medicare Other | Admitting: Cardiology

## 2014-07-03 ENCOUNTER — Other Ambulatory Visit (HOSPITAL_COMMUNITY): Payer: Self-pay | Admitting: Cardiology

## 2014-07-03 DIAGNOSIS — Z48812 Encounter for surgical aftercare following surgery on the circulatory system: Secondary | ICD-10-CM | POA: Diagnosis present

## 2014-07-03 DIAGNOSIS — I700 Atherosclerosis of aorta: Secondary | ICD-10-CM

## 2014-07-03 DIAGNOSIS — I739 Peripheral vascular disease, unspecified: Secondary | ICD-10-CM

## 2014-07-03 DIAGNOSIS — I708 Atherosclerosis of other arteries: Secondary | ICD-10-CM | POA: Diagnosis not present

## 2014-07-03 NOTE — Progress Notes (Signed)
ABI performed  

## 2014-07-03 NOTE — Progress Notes (Signed)
Aorta duplex performed. 

## 2014-07-25 ENCOUNTER — Other Ambulatory Visit: Payer: Self-pay | Admitting: *Deleted

## 2014-07-25 MED ORDER — ATORVASTATIN CALCIUM 40 MG PO TABS: 40.0000 mg | ORAL_TABLET | Freq: Every day | ORAL | Status: DC

## 2014-07-25 MED ORDER — METOPROLOL SUCCINATE ER 50 MG PO TB24: 50.0000 mg | ORAL_TABLET | Freq: Every day | ORAL | Status: DC

## 2014-10-18 ENCOUNTER — Encounter (HOSPITAL_COMMUNITY): Payer: Self-pay | Admitting: Cardiovascular Disease

## 2014-11-15 ENCOUNTER — Other Ambulatory Visit: Payer: Self-pay | Admitting: *Deleted

## 2014-11-15 MED ORDER — CLOPIDOGREL BISULFATE 75 MG PO TABS: 75.0000 mg | ORAL_TABLET | Freq: Every day | ORAL | Status: DC

## 2014-11-15 MED ORDER — ATORVASTATIN CALCIUM 40 MG PO TABS: 40.0000 mg | ORAL_TABLET | Freq: Every day | ORAL | Status: DC

## 2014-11-15 MED ORDER — METOPROLOL SUCCINATE ER 50 MG PO TB24: 50.0000 mg | ORAL_TABLET | Freq: Every day | ORAL | Status: DC

## 2014-12-11 ENCOUNTER — Emergency Department (HOSPITAL_COMMUNITY)
Admission: EM | Admit: 2014-12-11 | Discharge: 2014-12-11 | Discharge status: Against Medical Advice | Payer: Commercial Managed Care - HMO | Attending: Emergency Medicine | Admitting: Emergency Medicine

## 2014-12-11 ENCOUNTER — Encounter (HOSPITAL_COMMUNITY): Payer: Self-pay | Admitting: Emergency Medicine

## 2014-12-11 DIAGNOSIS — Z5321 Procedure and treatment not carried out due to patient leaving prior to being seen by health care provider: Secondary | ICD-10-CM

## 2014-12-11 DIAGNOSIS — R079 Chest pain, unspecified: Secondary | ICD-10-CM | POA: Insufficient documentation

## 2014-12-11 LAB — I-STAT TROPONIN, ED: TROPONIN I, POC: 0 ng/mL (ref 0.00–0.08)

## 2014-12-11 LAB — BASIC METABOLIC PANEL
Anion gap: 8 (ref 5–15)
BUN: 15 mg/dL (ref 6–23)
CALCIUM: 8.8 mg/dL (ref 8.4–10.5)
CO2: 23 mmol/L (ref 19–32)
Chloride: 107 mmol/L (ref 96–112)
Creatinine, Ser: 0.82 mg/dL (ref 0.50–1.10)
GFR, EST NON AFRICAN AMERICAN: 81 mL/min — AB (ref >90)
Glucose, Bld: 106 mg/dL — ABNORMAL HIGH (ref 70–99)
Potassium: 4.5 mmol/L (ref 3.5–5.1)
SODIUM: 138 mmol/L (ref 135–145)

## 2014-12-11 LAB — CBC WITH DIFFERENTIAL/PLATELET
BASOS PCT: 1 % (ref 0–1)
Basophils Absolute: 0.1 10*3/uL (ref 0.0–0.1)
EOS PCT: 3 % (ref 0–5)
Eosinophils Absolute: 0.2 10*3/uL (ref 0.0–0.7)
HCT: 45.9 % (ref 36.0–46.0)
HEMOGLOBIN: 15.5 g/dL — AB (ref 12.0–15.0)
LYMPHS ABS: 2.9 10*3/uL (ref 0.7–4.0)
LYMPHS PCT: 40 % (ref 12–46)
MCH: 29.5 pg (ref 26.0–34.0)
MCHC: 33.8 g/dL (ref 30.0–36.0)
MCV: 87.3 fL (ref 78.0–100.0)
Monocytes Absolute: 0.4 10*3/uL (ref 0.1–1.0)
Monocytes Relative: 6 % (ref 3–12)
NEUTROS ABS: 3.5 10*3/uL (ref 1.7–7.7)
Neutrophils Relative %: 50 % (ref 43–77)
Platelets: 243 10*3/uL (ref 150–400)
RBC: 5.26 MIL/uL — ABNORMAL HIGH (ref 3.87–5.11)
RDW: 13 % (ref 11.5–15.5)
WBC: 7.1 10*3/uL (ref 4.0–10.5)

## 2014-12-11 NOTE — ED Notes (Addendum)
Pt c/o mid sternal CP worse with inspiration x weeks; pt sts some body aches

## 2014-12-11 NOTE — ED Notes (Signed)
Called for room, No answer x1

## 2014-12-11 NOTE — ED Notes (Signed)
Pt. Called x2 for room and for xray. No answer.

## 2015-01-21 ENCOUNTER — Encounter: Payer: Self-pay | Admitting: Internal Medicine

## 2015-01-21 ENCOUNTER — Other Ambulatory Visit (INDEPENDENT_AMBULATORY_CARE_PROVIDER_SITE_OTHER): Payer: Commercial Managed Care - HMO

## 2015-01-21 ENCOUNTER — Ambulatory Visit (INDEPENDENT_AMBULATORY_CARE_PROVIDER_SITE_OTHER): Payer: Commercial Managed Care - HMO | Admitting: Internal Medicine

## 2015-01-21 VITALS — BP 122/78 | HR 81 | Temp 98.2°F | Resp 18 | Ht 66.0 in | Wt 189.0 lb

## 2015-01-21 DIAGNOSIS — IMO0001 Reserved for inherently not codable concepts without codable children: Secondary | ICD-10-CM

## 2015-01-21 DIAGNOSIS — M159 Polyosteoarthritis, unspecified: Secondary | ICD-10-CM

## 2015-01-21 DIAGNOSIS — M791 Myalgia: Secondary | ICD-10-CM

## 2015-01-21 DIAGNOSIS — I10 Essential (primary) hypertension: Secondary | ICD-10-CM

## 2015-01-21 DIAGNOSIS — R0602 Shortness of breath: Secondary | ICD-10-CM

## 2015-01-21 DIAGNOSIS — M609 Myositis, unspecified: Secondary | ICD-10-CM

## 2015-01-21 DIAGNOSIS — Z1239 Encounter for other screening for malignant neoplasm of breast: Secondary | ICD-10-CM | POA: Diagnosis not present

## 2015-01-21 DIAGNOSIS — I739 Peripheral vascular disease, unspecified: Secondary | ICD-10-CM

## 2015-01-21 DIAGNOSIS — M150 Primary generalized (osteo)arthritis: Secondary | ICD-10-CM

## 2015-01-21 DIAGNOSIS — Z0000 Encounter for general adult medical examination without abnormal findings: Secondary | ICD-10-CM

## 2015-01-21 DIAGNOSIS — M2550 Pain in unspecified joint: Secondary | ICD-10-CM

## 2015-01-21 LAB — CBC
HEMATOCRIT: 39.8 % (ref 36.0–46.0)
Hemoglobin: 13.4 g/dL (ref 12.0–15.0)
MCHC: 33.6 g/dL (ref 30.0–36.0)
MCV: 88.2 fl (ref 78.0–100.0)
PLATELETS: 224 10*3/uL (ref 150.0–400.0)
RBC: 4.51 Mil/uL (ref 3.87–5.11)
RDW: 13.6 % (ref 11.5–15.5)
WBC: 7.6 10*3/uL (ref 4.0–10.5)

## 2015-01-21 LAB — COMPREHENSIVE METABOLIC PANEL
ALK PHOS: 79 U/L (ref 39–117)
ALT: 21 U/L (ref 0–35)
AST: 17 U/L (ref 0–37)
Albumin: 4 g/dL (ref 3.5–5.2)
BILIRUBIN TOTAL: 0.3 mg/dL (ref 0.2–1.2)
BUN: 16 mg/dL (ref 6–23)
CO2: 32 mEq/L (ref 19–32)
CREATININE: 0.8 mg/dL (ref 0.40–1.20)
Calcium: 8.8 mg/dL (ref 8.4–10.5)
Chloride: 104 mEq/L (ref 96–112)
GFR: 80.13 mL/min (ref >60.00)
GLUCOSE: 93 mg/dL (ref 70–99)
Potassium: 3.4 mEq/L — ABNORMAL LOW (ref 3.5–5.1)
SODIUM: 140 meq/L (ref 135–145)
TOTAL PROTEIN: 6.5 g/dL (ref 6.0–8.3)

## 2015-01-21 LAB — HEMOGLOBIN A1C: HEMOGLOBIN A1C: 6.1 % (ref 4.6–6.5)

## 2015-01-21 LAB — RHEUMATOID FACTOR

## 2015-01-21 LAB — CK: Total CK: 107 U/L (ref 7–177)

## 2015-01-21 NOTE — Patient Instructions (Signed)
We will check some lab work today to see if we can fine a cause for the pain. We will be able to figure out the best thing for pain when we get the results and will call you.   We will also send you for a lung test to check some signs of damage from the smoking.   We will get the mammogram and the referral to orthopedics for your back and knee.   Come back in about 1 month to talk about all these results. It may also be worthwhile to go back to the cardiologist Dr. Fletcher Anon to see if he thinks any further work up needs to be done with your heart for the tightness and heaviness episodes you have been having.

## 2015-01-21 NOTE — Progress Notes (Signed)
Pre visit review using our clinic review tool, if applicable. No additional management support is needed unless otherwise documented below in the visit note. 

## 2015-01-22 LAB — PRO B NATRIURETIC PEPTIDE: Pro B Natriuretic peptide (BNP): 440.3 pg/mL — ABNORMAL HIGH (ref <126)

## 2015-01-22 LAB — ANA: ANA: NEGATIVE

## 2015-01-24 ENCOUNTER — Encounter: Payer: Self-pay | Admitting: Internal Medicine

## 2015-01-24 DIAGNOSIS — R0602 Shortness of breath: Secondary | ICD-10-CM | POA: Insufficient documentation

## 2015-01-24 DIAGNOSIS — M2550 Pain in unspecified joint: Secondary | ICD-10-CM | POA: Insufficient documentation

## 2015-01-24 NOTE — Assessment & Plan Note (Signed)
BP well controled on metoprolol-xl for now. Checking labs today. No side effects and continue current regimen.

## 2015-01-24 NOTE — Assessment & Plan Note (Signed)
Have asked her to continue following with cardiology and vascular for her PVD.

## 2015-01-24 NOTE — Assessment & Plan Note (Signed)
No prior work up and having more than 20 minutes of stiffness in the morning. Check ANA, RF, CK (on statin), CBC, CMP, HgA1c. Will continue to advise conservative treatment and refer to orthopedics to see if the back can be injected with steroids for pain relief. Has had good results with that in the past.

## 2015-01-24 NOTE — Progress Notes (Signed)
   Subjective:    Patient ID: Kayla Hopkins, female    DOB: 1961-01-27, 54 y.o.   MRN: [id number]  HPI The patient is a new 54 YO female here for SOB and joint pains. She has been more SOB with exertion but denies any chest pains. She was a smoker for a long time and has a chronic cough. She is no longer a smoker. Denies rapid heart rate. Does not have any inhalers at home. Has not tried anything for the SOB. Able to recover with rest for 5 minutes or less. This has been going on for 1 year or more. The next problem is joint pains. She has them and they seem to migrate joints. She rates it 6-7/10 and has tried OTC pain medicine for it without much relief. Stiffness in the morning that lasts longer than 20 minutes. These have been present for >2 years but worse in the last 6 months.   PMH, [facility], social history reviewed and updated today.   Review of Systems  Constitutional: Positive for activity change. Negative for fever, appetite change, fatigue and unexpected weight change.  HENT: Negative.   Eyes: Negative.   Respiratory: Positive for cough and shortness of breath. Negative for chest tightness and wheezing.   Cardiovascular: Negative for chest pain, palpitations and leg swelling.  Gastrointestinal: Negative for nausea, abdominal pain, diarrhea, constipation and abdominal distention.  Musculoskeletal: Positive for myalgias, back pain and arthralgias.  Skin: Negative.   Neurological: Negative.   Psychiatric/Behavioral: Negative.       Objective:   Physical Exam  Constitutional: She is oriented to person, place, and time. She appears well-developed and well-nourished.  HENT:  Head: Normocephalic and atraumatic.  Eyes: EOM are normal.  Neck: Normal range of motion.  Cardiovascular: Normal rate and regular rhythm.   Pulmonary/Chest: Effort normal and breath sounds normal.  Abdominal: Soft. Bowel sounds are normal. She exhibits no distension. There is no tenderness.  Musculoskeletal:  She exhibits tenderness.  Neurological: She is alert and oriented to person, place, and time. Coordination normal.  Skin: Skin is warm and dry.  Psychiatric: She has a normal mood and affect.   Filed Vitals:   01/21/15 1413  BP: 122/78  Pulse: 81  Temp: 98.2 F (36.8 C)  TempSrc: Oral  Resp: 18  Height: 5\' 6"  (1.676 m)  Weight: 189 lb (85.73 kg)  SpO2: 95%      Assessment & Plan:

## 2015-01-24 NOTE — Assessment & Plan Note (Signed)
She does have PVD so could be cardiac in nature. Checking BNP to see if we can r/o heart failure as cause. PFTs to see if she has COPD from smoking.

## 2015-02-12 ENCOUNTER — Encounter: Payer: Self-pay | Admitting: Cardiovascular Disease

## 2015-02-12 ENCOUNTER — Ambulatory Visit (INDEPENDENT_AMBULATORY_CARE_PROVIDER_SITE_OTHER): Payer: Commercial Managed Care - HMO | Admitting: Cardiovascular Disease

## 2015-02-12 VITALS — BP 178/102 | HR 67 | Ht 66.0 in | Wt 189.8 lb

## 2015-02-12 DIAGNOSIS — I739 Peripheral vascular disease, unspecified: Secondary | ICD-10-CM | POA: Diagnosis not present

## 2015-02-12 DIAGNOSIS — I10 Essential (primary) hypertension: Secondary | ICD-10-CM

## 2015-02-12 DIAGNOSIS — R51 Headache: Secondary | ICD-10-CM

## 2015-02-12 DIAGNOSIS — E785 Hyperlipidemia, unspecified: Secondary | ICD-10-CM | POA: Diagnosis not present

## 2015-02-12 DIAGNOSIS — R519 Headache, unspecified: Secondary | ICD-10-CM

## 2015-02-12 DIAGNOSIS — R0602 Shortness of breath: Secondary | ICD-10-CM

## 2015-02-12 NOTE — Progress Notes (Signed)
HPI  Kayla Hopkins is a 54 year old female who is here today for a followup visit.  She has no known CAD per cath back in 2009. She does have known history of peripheral arterial disease, paroxysmal atrial fibrillation, hypertension and hyperlipidemia.  She is followed for bilateral iliac stenting. She underwent bilateral kissing stent placement with an 8 x 24 mm Genesis stent in 2010.  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 She had successful balloon angioplasty without significant residual stenosis. She developed recurrent in-stent restenosis. This was treated with a covered stent placement. There was moderate stenosis in distal left common iliac artery at that time.  She was seen for severe left hip and thigh claudication in 04/2013.  ABI significantly dropped on the left to 0.61 with evidence of severe stenosis in distal left common iliac artery.  She underwent angiography in 05/2013 which showed 70% stenosis in the distal left common iliac artery just before the origin of the internal iliac artery. There was greater than 40 mm systolic gradient across the lesion. Thus, this was treated with a self-expanding stent placement without complications.  Most recent noninvasive evaluation in 06/2014 showed normal ABI with borderline stenosis in right common iliac artery.  She was hospitalized in April,2015 for palpitations and atypical chest pain. Cardiac workup was negative. She felt that her symptoms were due to electronic cigarettes. She quit smoking completely since then and has been doing better. She denies claudication. Most recent nuclear stress test in May 2015 showed no evidence of ischemia with normal ejection fraction. She has been having increased shortness of breath and occasional chest tightness especially when she takes a deep breath. She is also complaining of daily headache over the last 2 weeks  mostly behind the right eye. The headache usually responds to Tylenol.   Allergies  Allergen Reactions  . Pradaxa [Dabigatran Etexilate Mesylate]     Gastritis   . Zocor [Simvastatin]     Leg pains      Current Outpatient Prescriptions on File Prior to Visit  Medication Sig Dispense Refill  . acetaminophen (TYLENOL) 325 MG tablet Take 2 tablets (650 mg total) by mouth every 6 (six) hours as needed for mild pain (or Fever >/= 101).    [name] aspirin EC 81 MG tablet Take 81 mg by mouth every morning.     [name] atorvastatin (LIPITOR) 40 MG tablet Take 1 tablet (40 mg total) by mouth daily at 6 PM. 90 tablet 1  . clopidogrel (PLAVIX) 75 MG tablet Take 1 tablet (75 mg total) by mouth daily. 90 tablet 1  . fish oil-omega-3 fatty acids 1000 MG capsule Take 1 g by mouth every morning.     . metoprolol succinate (TOPROL-XL) 50 MG 24 hr tablet Take 1 tablet (50 mg total) by mouth daily. Take with or immediately following a meal. 90 tablet 1   No current facility-administered medications on file prior to visit.     Past Medical History  Diagnosis Date  . PAF (paroxysmal atrial fibrillation)   . Hypertension   . Peripheral vascular disease 06/11/2009    Bilateral common iliac kissing stents (8X24 mm) . 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 She underwent successful balloon angioplasty.  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   [name]  Hyperlipidemia   . Tobacco abuse   . Claudication     clots in legs  . S/P cardiac cath 2009    Normal  . Arthritis   . Depression      Past Surgical History  Procedure Laterality Date  . Cardiac catheterization  2009    with stents  . Lumbar disc surgery      spinal stenosis  . Knee arthroscopy Bilateral   . Abdominal angiogram N/A 05/11/2012    Procedure: ABDOMINAL ANGIOGRAM;  Surgeon: [name], MD;  Location: [facility] CATH LAB;  Service: Cardiovascular;  Laterality: N/A;  . Abdominal aortagram N/A 10/19/2012    Procedure: ABDOMINAL Maxcine Ham;  Surgeon: [name], MD;  Location: [facility] CATH LAB;  Service: Cardiovascular;  Laterality: N/A;  . Abdominal aortagram N/A 05/10/2013    Procedure: ABDOMINAL Maxcine Ham;  Surgeon: [name], MD;  Location: [facility] CATH LAB;  Service: Cardiovascular;  Laterality: N/A;     Family History  Problem Relation Age of Onset  . Hypertension Mother   . Heart disease Father   . Heart attack Father   . Hypertension Brother   . Ovarian cancer Mother   . Lung cancer Mother   . Colon polyps Sister      History   Social History  . Marital Status: Married    Spouse Name: N/A  . Number of Children: 3  . Years of Education: N/A   Occupational History  . Not on file.   Social History Main Topics  . Smoking status: Former Smoker    Types: Cigarettes  . Smokeless tobacco: Never Used     Comment: electronic cigarette  . Alcohol Use: No  . Drug Use: No  . Sexual Activity: Yes   Other Topics Concern  . Not on file   Social History Narrative       PHYSICAL EXAM   BP 178/102 mmHg  Pulse 67  Ht 5\' 6"  (1.676 m)  Wt 189 lb 12 oz (86.07 kg)  BMI 30.64 kg/m2 Constitutional: She is oriented to person, place, and time. She appears well-developed and well-nourished. No distress.  HENT: No nasal discharge.  Head: Normocephalic and atraumatic.  Eyes: Pupils are equal and round. Right eye exhibits no discharge. Left eye exhibits no discharge.  Neck: Normal range of motion. Neck supple. No JVD present. No thyromegaly present.  Cardiovascular: Normal rate, regular rhythm, normal heart sounds. Exam reveals no gallop and no friction rub. No murmur heard.  Pulmonary/Chest: Effort normal and breath sounds normal. No stridor. No respiratory distress. She has no wheezes. She has no rales. She exhibits no tenderness.  Abdominal: Soft. Bowel sounds  are normal. She exhibits no distension. There is no tenderness. There is no rebound and no guarding.  Musculoskeletal: Normal range of motion. She exhibits no edema and no tenderness.  Neurological: She is alert and oriented to person, place, and time. Coordination normal.  Skin: Skin is warm and dry. No rash noted. She is not diaphoretic. No erythema. No pallor.  Psychiatric: She has a normal mood and affect. Her behavior is normal. Judgment and thought content normal.  Vascular: Femoral pulses:  very diminished on both sides. Distal pulses are palpable on the left but not the right.    ASSESSMENT AND PLAN

## 2015-02-12 NOTE — Assessment & Plan Note (Signed)
Blood pressure is elevated today but she is having headache. Recent blood pressure was normal. Thus, I did not make any changes.

## 2015-02-12 NOTE — Patient Instructions (Signed)
Your physician has requested that you have an echocardiogram. Echocardiography is a painless test that uses sound waves to create images of your heart. It provides your doctor with information about the size and shape of your heart and how well your heart's chambers and valves are working. This procedure takes approximately one hour. There are no restrictions for this procedure.  You have been referred to Neurology for evaluation of headaches.   Your physician recommends that you continue on your current medications as directed. Please refer to the Current Medication list given to you today.  Your physician has requested that you have an ankle brachial index (ABI) in 6 MONTHS. During this test an ultrasound and blood pressure cuff are used to evaluate the arteries that supply the arms and legs with blood. Allow thirty minutes for this exam. There are no restrictions or special instructions.  Your physician has recommended an aorto-iliac duplex in 6 MONTHS.   Your physician wants you to follow-up in: 6 MONTHS with Dr Fletcher Anon. You will receive a reminder letter in the mail two months in advance. If you don't receive a letter, please call our office to schedule the follow-up appointment.

## 2015-02-12 NOTE — Assessment & Plan Note (Addendum)
She is doing well overall with no recurrent claudication. Continue medical therapy and repeat aortoiliac duplex an ABI in 6 months.

## 2015-02-12 NOTE — Assessment & Plan Note (Signed)
The etiology of this is not entirely clear. ECG is unremarkable. She had a nuclear stress test done in may of 2015 which showed no significant ischemia. Also prior cardiac catheterization showed no evident coronary artery disease. Thus, this unlikely related to coronary artery disease. I requested an echocardiogram to evaluate LV systolic function, diastolic function and pulmonary pressure. She might need further pulmonary evaluation if symptoms persist.

## 2015-02-12 NOTE — Assessment & Plan Note (Signed)
Continue treatment with atorvastatin with a target LDL of less than 70. 

## 2015-02-14 ENCOUNTER — Ambulatory Visit (HOSPITAL_COMMUNITY): Payer: Commercial Managed Care - HMO | Attending: Cardiology | Admitting: Radiology

## 2015-02-14 ENCOUNTER — Ambulatory Visit
Admission: RE | Admit: 2015-02-14 | Discharge: 2015-02-14 | Disposition: A | Discharge status: Home / Self Care | Payer: Commercial Managed Care - HMO | Source: Ambulatory Visit | Attending: Internal Medicine | Admitting: Internal Medicine

## 2015-02-14 DIAGNOSIS — R0602 Shortness of breath: Secondary | ICD-10-CM

## 2015-02-14 DIAGNOSIS — Z1239 Encounter for other screening for malignant neoplasm of breast: Secondary | ICD-10-CM

## 2015-02-14 NOTE — Progress Notes (Signed)
Echocardiogram performed.  

## 2015-02-20 ENCOUNTER — Telehealth: Payer: Self-pay | Admitting: Internal Medicine

## 2015-02-20 DIAGNOSIS — N6459 Other signs and symptoms in breast: Secondary | ICD-10-CM

## 2015-02-20 NOTE — Telephone Encounter (Signed)
Patient states she is having R breast pain in the middle of her breast and also she states her nipple is indented. The breast center needs orders for diagnostic mammogram and [place] (uni right inc axilla).

## 2015-02-20 NOTE — Telephone Encounter (Signed)
Have sent to the breast center.

## 2015-02-21 ENCOUNTER — Ambulatory Visit (INDEPENDENT_AMBULATORY_CARE_PROVIDER_SITE_OTHER): Payer: Commercial Managed Care - HMO | Admitting: Internal Medicine

## 2015-02-21 ENCOUNTER — Encounter: Payer: Self-pay | Admitting: Internal Medicine

## 2015-02-21 VITALS — BP 128/78 | HR 68 | Temp 97.9°F | Resp 14 | Ht 66.0 in | Wt 187.0 lb

## 2015-02-21 DIAGNOSIS — M961 Postlaminectomy syndrome, not elsewhere classified: Secondary | ICD-10-CM | POA: Diagnosis not present

## 2015-02-21 MED ORDER — PREDNISONE 20 MG PO TABS: 40.0000 mg | ORAL_TABLET | Freq: Every day | ORAL | Status: DC

## 2015-02-21 MED ORDER — TIZANIDINE HCL 4 MG PO TABS: 4.0000 mg | ORAL_TABLET | Freq: Three times a day (TID) | ORAL | Status: DC | PRN

## 2015-02-21 NOTE — Progress Notes (Signed)
Pre visit review using our clinic review tool, if applicable. No additional management support is needed unless otherwise documented below in the visit note. 

## 2015-02-21 NOTE — Patient Instructions (Signed)
We have sent in the steroids that you will take 2 pills a day for 5 days to help calm down the muscles in the back and in the chest.   Keep the appointment for the lungs tomorrow and with the back for the 20th.   We have also sent in a strong pain medicine for the back called tizanidine that you can take 1 pill up to 3 times a day for pain.   Call us back if you have any problems or questions.   Back Injury Prevention The following tips can help you to prevent a back injury. PHYSICAL FITNESS  Exercise often. Try to develop strong stomach (abdominal) muscles.  Do aerobic exercises often. This includes walking, jogging, biking, swimming.  Do exercises that help with balance and strength often. This includes tai chi and yoga.  Stretch before and after you exercise.  Keep a healthy weight. DIET   Ask your doctor how much calcium and vitamin D you need every day.  Include calcium in your diet. Foods high in calcium include dairy products; green, leafy vegetables; and products with calcium added (fortified).  Include vitamin D in your diet. Foods high in vitamin D include milk and products with vitamin D added.  Think about taking a multivitamin or other nutritional products called " supplements."  Stop smoking if you smoke. POSTURE   Sit and stand up straight. Avoid leaning forward or hunching over.  Choose chairs that support your lower back.  If you work at a desk:  Sit close to your work so you do not lean over.  Keep your chin tucked in.  Keep your neck drawn back.  Keep your elbows bent at a right angle. Your arms should look like the letter "L."  Sit high and close to the steering wheel when you drive. Add low back support to your car seat if needed.  Avoid sitting or standing in one position for too long. Get up and move around every hour. Take breaks if you are driving for a long time.  Sleep on your side with your knees slightly bent. You can also sleep on your  back with a pillow under your knees. Do not sleep on your stomach. LIFTING, TWISTING, AND REACHING  Avoid heavy lifting, especially lifting over and over again. If you must do heavy lifting:  Stretch before lifting.  Work slowly.  Rest between lifts.  Use carts and dollies to move objects when possible.  Make several small trips instead of carrying 1 heavy load.  Ask for help when you need it.  Ask for help when moving big, awkward objects.  Follow these steps when lifting:  Stand with your feet shoulder-width apart.  Get as close to the object as you can. Do not pick up heavy objects that are far from your body.  Use handles or lifting straps when possible.  Bend at your knees. Squat down, but keep your heels off the floor.  Keep your shoulders back, your chin tucked in, and your back straight.  Lift the object slowly. Tighten the muscles in your legs, stomach, and butt. Keep the object as close to the center of your body as possible.  Reverse these directions when you put a load down.  Do not:  Lift the object above your waist.  Twist at the waist while lifting or carrying a load. Move your feet if you need to turn, not your waist.  Bend over without bending at your knees.  Avoid  reaching over your head, across a table, or for an object on a high surface. OTHER TIPS  Avoid wet floors and keep sidewalks clear of ice.  Do not sleep on a mattress that is too soft or too hard.  Keep items that you use often within easy reach.  Put heavier objects on shelves at waist level. Put lighter objects on lower or higher shelves.  Find ways to lessen your stress. You can try exercise, massage, or relaxation.  Get help for depression or anxiety if needed. GET HELP IF:  You injure your back.  You have questions about diet, exercise, or other ways to prevent back injuries. MAKE SURE YOU:  Understand these instructions.  Will watch your condition.  Will get help  right away if you are not doing well or get worse. Document Released: 04/13/2008 Document Revised: 01/18/2012 Document Reviewed: 12/07/2011 [facility] Patient Information 2015 [place], [place]. This information is not intended to replace advice given to you by your health care provider. Make sure you discuss any questions you have with your health care provider.

## 2015-02-22 ENCOUNTER — Ambulatory Visit (INDEPENDENT_AMBULATORY_CARE_PROVIDER_SITE_OTHER): Payer: Commercial Managed Care - HMO | Admitting: Internal Medicine

## 2015-02-22 DIAGNOSIS — R0602 Shortness of breath: Secondary | ICD-10-CM

## 2015-02-22 LAB — PULMONARY FUNCTION TEST
DL/VA % PRED: 99 %
DL/VA: 5.02 ml/min/mmHg/L
DLCO UNC: 24.01 ml/min/mmHg
DLCO unc % pred: 89 %
FEF 25-75 PRE: 2.24 L/s
FEF 25-75 Post: 3.26 L/sec
FEF2575-%CHANGE-POST: 45 %
FEF2575-%PRED-PRE: 79 %
FEF2575-%Pred-Post: 116 %
FEV1-%Change-Post: 9 %
FEV1-%PRED-POST: 90 %
FEV1-%Pred-Pre: 82 %
FEV1-PRE: 2.44 L
FEV1-Post: 2.67 L
FEV1FVC-%Change-Post: 4 %
FEV1FVC-%Pred-Pre: 98 %
FEV6-%Change-Post: 4 %
FEV6-%PRED-POST: 89 %
FEV6-%Pred-Pre: 85 %
FEV6-POST: 3.26 L
FEV6-Pre: 3.1 L
FEV6FVC-%Change-Post: 0 %
FEV6FVC-%PRED-POST: 103 %
FEV6FVC-%Pred-Pre: 102 %
FVC-%CHANGE-POST: 4 %
FVC-%PRED-PRE: 82 %
FVC-%Pred-Post: 86 %
FVC-POST: 3.26 L
FVC-Pre: 3.11 L
PRE FEV1/FVC RATIO: 79 %
Post FEV1/FVC ratio: 82 %
Post FEV6/FVC ratio: 100 %
Pre FEV6/FVC Ratio: 100 %
RV % pred: 77 %
RV: 1.5 L
TLC % PRED: 88 %
TLC: 4.74 L

## 2015-02-22 NOTE — Progress Notes (Signed)
PFT done today. 

## 2015-02-23 NOTE — Progress Notes (Signed)
   Subjective:    Patient ID: Kayla Hopkins, female    DOB: [birth year], 54 y.o.   MRN: [id number]  HPI The patient is a 54 YO female who is coming in for worsening low back pain. She has been struggling with chronic low back pain for years and doing fairly well with NSAIDs and tylenol. In the last 3 weeks she has had worsening pain and is not sure she remembers any thing she might have done to strain it. She denies heavy lifting or twisting. Pain does not go into her legs, no new numbness in her legs, no bowel or bladder incontinence. Rates it 8/10.   Review of Systems  Constitutional: Negative for fever, activity change, appetite change, fatigue and unexpected weight change.  Respiratory: Negative.   Cardiovascular: Negative.   Musculoskeletal: Positive for myalgias and back pain. Negative for gait problem.  Skin: Negative.       Objective:   Physical Exam  Constitutional: She is oriented to person, place, and time. She appears well-developed and well-nourished.  Cardiovascular: Normal rate and regular rhythm.   Pulmonary/Chest: Effort normal and breath sounds normal.  Abdominal: Soft.  Musculoskeletal: She exhibits tenderness.  Bilateral lumbar paraspinal tenderness.   Neurological: She is alert and oriented to person, place, and time. Coordination normal.  Skin: Skin is warm and dry.   Filed Vitals:   02/21/15 1029  BP: 128/78  Pulse: 68  Temp: 97.9 F (36.6 C)  TempSrc: Oral  Resp: 14  Height: 5\' 6"  (1.676 m)  Weight: 187 lb (84.823 kg)  SpO2: 96%      Assessment & Plan:

## 2015-02-23 NOTE — Assessment & Plan Note (Signed)
Pain worse without clear precipitant. No spinal tenderness on exam. Try steroid burst for pain and muscle relaxer with tizanidine. If no improvement consider repeat referral to orthopedics for possible steroid injection.

## 2015-02-27 DIAGNOSIS — M542 Cervicalgia: Secondary | ICD-10-CM | POA: Diagnosis not present

## 2015-02-27 DIAGNOSIS — M545 Low back pain: Secondary | ICD-10-CM | POA: Diagnosis not present

## 2015-02-27 DIAGNOSIS — M546 Pain in thoracic spine: Secondary | ICD-10-CM | POA: Diagnosis not present

## 2015-02-27 DIAGNOSIS — I10 Essential (primary) hypertension: Secondary | ICD-10-CM | POA: Diagnosis not present

## 2015-03-05 DIAGNOSIS — M545 Low back pain: Secondary | ICD-10-CM | POA: Diagnosis not present

## 2015-03-18 ENCOUNTER — Encounter: Payer: Self-pay | Admitting: Neurology

## 2015-03-18 ENCOUNTER — Ambulatory Visit (INDEPENDENT_AMBULATORY_CARE_PROVIDER_SITE_OTHER): Payer: Commercial Managed Care - HMO | Admitting: Neurology

## 2015-03-18 VITALS — BP 128/70 | HR 64 | Resp 18 | Ht 66.0 in | Wt 189.4 lb

## 2015-03-18 DIAGNOSIS — G43119 Migraine with aura, intractable, without status migrainosus: Secondary | ICD-10-CM

## 2015-03-18 DIAGNOSIS — G4441 Drug-induced headache, not elsewhere classified, intractable: Secondary | ICD-10-CM

## 2015-03-18 DIAGNOSIS — G4440 Drug-induced headache, not elsewhere classified, not intractable: Secondary | ICD-10-CM | POA: Insufficient documentation

## 2015-03-18 MED ORDER — TOPIRAMATE 25 MG PO TABS: 25.0000 mg | ORAL_TABLET | Freq: Every day | ORAL | Status: DC

## 2015-03-18 NOTE — Progress Notes (Signed)
NEUROLOGY CONSULTATION NOTE  Kayla Hopkins MRN: [id number] DOB: Sep 01, 1961  Referring provider: Dr. Fletcher Anon Primary care provider: Dr. Doug Sou  Reason for consult:  headache  HISTORY OF PRESENT ILLNESS: Kayla Hopkins is a 54 year old right-handed woman with paroxysmal atrial fibrillation, peripheral vascular disease, hyperlipidemia, depression, hypertension, tobacco abuse and post-laminectomy syndrome who presents for headache.  Records and CT of head reviewed.  Onset:  8 months ago.  She had similar headaches in 2003 which resolved after 5 years. Location:  Right sided (eye, back of head) Quality:  pounding Intensity:  10/10 when severe (usually wakes up with dull headache) Aura:  Blurred vision in right eye Prodrome:  no Associated symptoms:  Nausea, vomiting, photophobia, phonophobia, sometimes osmophobia Duration:  All daily Frequency:  daily Triggers/exacerbating factors:  none Relieving factors:  none Activity:  Cannot function 1 to 2 days per week  Past abortive therapy:  Hydrocodone (ran out a week ago), used probable Imitrex NS in remote past Past preventative therapy:  Cannot remember  Current abortive therapy:  Tylenol (daily), ibuprofen (daily) Current preventative therapy:  none Other medication:  metoprolol 24h 50mg , Plavix, ASA 81mg , tizanidine (for back pain)  CT of head from 03/08/14 was unremarkable.  Caffeine:  1 cup of coffee daily Alcohol:  no Smoker:  Currently using vapor Diet:  Eats healthy, keeps hydrated Exercise:  no Depression/stress:  Controlled, although had recent panic attack Sleep hygiene:  Poor due to headache Family history of headache:  no  PAST MEDICAL HISTORY: Past Medical History  Diagnosis Date  . PAF (paroxysmal atrial fibrillation)   . Hypertension   . Peripheral vascular disease 06/11/2009    Bilateral common iliac kissing stents (8X24 mm) . Repeat angiography in July of 2013 showed severe in-stent restenosis in the  right common iliac artery stent. She underwent successful balloon angioplasty.  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   . Hyperlipidemia   . Tobacco abuse   . Claudication     clots in legs  . S/P cardiac cath 2009    Normal  . Arthritis   . Depression   . Headache     PAST SURGICAL HISTORY: Past Surgical History  Procedure Laterality Date  . Cardiac catheterization  2009    with stents  . Lumbar disc surgery      spinal stenosis  . Knee arthroscopy Bilateral   . Abdominal angiogram N/A 05/11/2012    Procedure: ABDOMINAL ANGIOGRAM;  Surgeon: [name], MD;  Location: [facility] CATH LAB;  Service: Cardiovascular;  Laterality: N/A;  . Abdominal aortagram N/A 10/19/2012    Procedure: ABDOMINAL Maxcine Ham;  Surgeon: [name], MD;  Location: [facility] CATH LAB;  Service: Cardiovascular;  Laterality: N/A;  . Abdominal aortagram N/A 05/10/2013    Procedure: ABDOMINAL Maxcine Ham;  Surgeon: [name], MD;  Location: [facility] CATH LAB;  Service: Cardiovascular;  Laterality: N/A;    MEDICATIONS: Current Outpatient Prescriptions on File Prior to Visit  Medication Sig Dispense Refill  . acetaminophen (TYLENOL) 325 MG tablet Take 2 tablets (650 mg total) by mouth every 6 (six) hours as needed for mild pain (or Fever >/= 101).    [name] aspirin EC 81 MG tablet Take 81 mg by mouth every morning.     [name] atorvastatin (LIPITOR) 40 MG tablet Take 1 tablet (40 mg total) by mouth daily at 6 PM. 90 tablet 1  .  clopidogrel (PLAVIX) 75 MG tablet Take 1 tablet (75 mg total) by mouth daily. 90 tablet 1  . fish oil-omega-3 fatty acids 1000 MG capsule Take 1 g by mouth every morning.     . metoprolol succinate (TOPROL-XL) 50 MG 24 hr tablet Take 1 tablet (50 mg total) by mouth daily. Take with or immediately following a meal. 90 tablet 1  . tiZANidine (ZANAFLEX) 4 MG tablet Take 1 tablet (4 mg total) by mouth every 8 (eight)  hours as needed (pain). 60 tablet 0  . predniSONE (DELTASONE) 20 MG tablet Take 2 tablets (40 mg total) by mouth daily with breakfast. (Patient not taking: Reported on 03/18/2015) 10 tablet 0   No current facility-administered medications on file prior to visit.    ALLERGIES: Allergies  Allergen Reactions  . Pradaxa [Dabigatran Etexilate Mesylate]     Gastritis   . Zocor [Simvastatin]     Leg pains     FAMILY HISTORY: Family History  Problem Relation Age of Onset  . Hypertension Mother   . Heart disease Father   . Heart attack Father   . Hypertension Brother   . Ovarian cancer Mother   . Lung cancer Mother   . Colon polyps Sister   . Cancer Maternal Grandfather     lung  . Cancer Paternal Grandfather     throat     SOCIAL HISTORY: History   Social History  . Marital Status: Married    Spouse Name: N/A  . Number of Children: 3  . Years of Education: N/A   Occupational History  . Not on file.   Social History Main Topics  . Smoking status: Former Smoker    Types: Cigarettes  . Smokeless tobacco: Never Used     Comment: electronic cigarette  . Alcohol Use: No  . Drug Use: No  . Sexual Activity:    Partners: Male   Other Topics Concern  . Not on file   Social History Narrative    REVIEW OF SYSTEMS: Constitutional: No fevers, chills, or sweats, no generalized fatigue, change in appetite Eyes: No visual changes, double vision, eye pain Ear, nose and throat: No hearing loss, ear pain, nasal congestion, sore throat Cardiovascular: No chest pain, palpitations Respiratory:  No shortness of breath at rest or with exertion, wheezes GastrointestinaI: No nausea, vomiting, diarrhea, abdominal pain, fecal incontinence Genitourinary:  No dysuria, urinary retention or frequency Musculoskeletal:  back pain Integumentary: No rash, pruritus, skin lesions Neurological: as above Psychiatric: No depression, anxiety Endocrine: No palpitations, fatigue, diaphoresis, mood  swings, change in appetite, change in weight, increased thirst Hematologic/Lymphatic:  No anemia, purpura, petechiae. Allergic/Immunologic: no itchy/runny eyes, nasal congestion, recent allergic reactions, rashes  PHYSICAL EXAM: Filed Vitals:   03/18/15 0806  BP: 128/70  Pulse: 64  Resp: 18   General: No acute distress Head:  Normocephalic/atraumatic Eyes:  fundi unremarkable, without vessel changes, exudates, hemorrhages or papilledema. Neck: supple, no paraspinal tenderness, full range of motion Back: No paraspinal tenderness Heart: regular rate and rhythm Lungs: Clear to auscultation bilaterally. Vascular: No carotid bruits. Neurological Exam: Mental status: alert and oriented to person, place, and time, recent and remote memory intact, fund of knowledge intact, attention and concentration intact, speech fluent and not dysarthric, language intact. Cranial nerves: CN I: not tested CN II: pupils equal, round and reactive to light, visual fields intact, fundi unremarkable, without vessel changes, exudates, hemorrhages or papilledema. CN III, IV, VI:  full range of motion, no nystagmus, no ptosis CN  V: facial sensation intact CN VII: upper and lower face symmetric CN VIII: hearing intact CN IX, X: gag intact, uvula midline CN XI: sternocleidomastoid and trapezius muscles intact CN XII: tongue midline Bulk & Tone: normal, no fasciculations. Motor:  5/5 throughout Sensation:  Temperature and vibration intact Deep Tendon Reflexes:  2+ throughout, toes downgoing Finger to nose testing:  No dysmetria Heel to shin:  No dysmetria Gait:  Normal station and stride.  Able to turn and walk in tandem. Romberg negative.  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  Start topiramate 25mg  at bedtime 2.  Stop tylenol and ibuprofen.  May try naproxen 500mg  (long-acting) but limit to no more than 2 days out of the week 3.  Call in 4 weeks with update.   Follow up in 3 months.  Thank you for allowing me to take part in the care of this patient.  Metta Clines, DO  CC:  Kathlyn Sacramento, MD  Vertell Novak, MD

## 2015-03-18 NOTE — Patient Instructions (Signed)
1.  We will start topiramate (Topamax) 25mg  at bedtime.  Possible side effects include: impaired thinking, sedation, paresthesias (numbness and tingling) and weight loss.  It may cause dehydration and there is a small risk for kidney stones, so make sure to stay hydrated with water during the day.  There is also a very small risk for glaucoma, so if you notice any change in your vision while taking this medication, see an ophthalmologist.    2.  Stop Tylenol and ibuprofen.  At earliest onset of headache, take naproxen 500mg .  May repeat dose in 12 hours if needed.  Do not take more than 2 days out of the week. 3.  Follow up in 3 months.  CALL IN 4 WEEKS WITH UPDATE AND WE CAN ADJUST MEDICATION IF NEEDED.  Keep headache diary 4.  Start routine exercise

## 2015-03-26 ENCOUNTER — Ambulatory Visit
Admission: RE | Admit: 2015-03-26 | Discharge: 2015-03-26 | Disposition: A | Discharge status: Home / Self Care | Payer: Commercial Managed Care - HMO | Source: Ambulatory Visit | Attending: Internal Medicine | Admitting: Internal Medicine

## 2015-03-26 ENCOUNTER — Other Ambulatory Visit: Payer: Self-pay | Admitting: Internal Medicine

## 2015-03-26 DIAGNOSIS — R928 Other abnormal and inconclusive findings on diagnostic imaging of breast: Secondary | ICD-10-CM

## 2015-03-26 DIAGNOSIS — N6459 Other signs and symptoms in breast: Secondary | ICD-10-CM

## 2015-03-26 DIAGNOSIS — N63 Unspecified lump in breast: Secondary | ICD-10-CM | POA: Diagnosis not present

## 2015-05-10 ENCOUNTER — Other Ambulatory Visit: Payer: Self-pay | Admitting: Cardiovascular Disease

## 2015-05-10 NOTE — Telephone Encounter (Signed)
Please review for refill. Thanks!  

## 2015-05-11 ENCOUNTER — Ambulatory Visit (INDEPENDENT_AMBULATORY_CARE_PROVIDER_SITE_OTHER): Payer: Commercial Managed Care - HMO | Admitting: Emergency Medicine

## 2015-05-11 VITALS — BP 168/98 | HR 74 | Temp 97.4°F | Resp 17 | Ht 65.5 in | Wt 190.2 lb

## 2015-05-11 DIAGNOSIS — M436 Torticollis: Secondary | ICD-10-CM | POA: Diagnosis not present

## 2015-05-11 MED ORDER — HYDROCODONE-ACETAMINOPHEN 5-325 MG PO TABS: 1.0000 | ORAL_TABLET | ORAL | Status: DC | PRN

## 2015-05-11 MED ORDER — CYCLOBENZAPRINE HCL 10 MG PO TABS: 10.0000 mg | ORAL_TABLET | Freq: Three times a day (TID) | ORAL | Status: DC | PRN

## 2015-05-11 MED ORDER — NAPROXEN SODIUM 550 MG PO TABS: 550.0000 mg | ORAL_TABLET | Freq: Two times a day (BID) | ORAL | Status: DC

## 2015-05-11 NOTE — Progress Notes (Signed)
Subjective:  Patient ID: Kayla Hopkins, female    DOB: 1961/01/17  Age: 54 y.o. MRN: [id number]  CC: Torticollis and Arm Pain   HPI Kayla Hopkins presents  with neck pain. She woke this morning with no history of injury or overuse with pain in the neck in her neck is inclined of the right she can't straighten it she also should pain in her posterior left shoulder. The pain is worse whenever she moves her head or left arm. She has no radiation of pain in her arm. No numbness tingling or weakness. She specifically denies any injury. Has no history of recurrent neck injury.  History Leiloni has a past medical history of PAF (paroxysmal atrial fibrillation); Hypertension; Peripheral vascular disease (06/11/2009); Hyperlipidemia; Tobacco abuse; Claudication; S/P cardiac cath (2009); Arthritis; Depression; and Headache.   She has past surgical history that includes Cardiac catheterization (2009); Lumbar disc surgery; Knee arthroscopy (Bilateral); abdominal angiogram (N/A, 05/11/2012); abdominal aortagram (N/A, 10/19/2012); and abdominal aortagram (N/A, 05/10/2013).   Her  family history includes Cancer in her maternal grandfather and paternal grandfather; Colon polyps in her sister; Heart attack in her father; Heart disease in her father; Hypertension in her brother and mother; Lung cancer in her mother; Ovarian cancer in her mother.  She   reports that she has been smoking Cigarettes.  She has been smoking about 0.50 packs per day. She has never used smokeless tobacco. She reports that she does not drink alcohol or use illicit drugs.  Outpatient Prescriptions Prior to Visit  Medication Sig Dispense Refill  . acetaminophen (TYLENOL) 325 MG tablet Take 2 tablets (650 mg total) by mouth every 6 (six) hours as needed for mild pain (or Fever >/= 101).    [name] aspirin EC 81 MG tablet Take 81 mg by mouth every morning.     [name] atorvastatin (LIPITOR) 40 MG tablet TAKE 1 TABLET EVERY DAY  AT  6PM 90  tablet 1  . clopidogrel (PLAVIX) 75 MG tablet TAKE 1 TABLET EVERY DAY 90 tablet 1  . fish oil-omega-3 fatty acids 1000 MG capsule Take 1 g by mouth every morning.     . metoprolol succinate (TOPROL-XL) 50 MG 24 hr tablet TAKE 1 TABLET (50 MG TOTAL) BY MOUTH DAILY. TAKE WITH OR IMMEDIATELY FOLLOWING A MEAL. 90 tablet 1  . predniSONE (DELTASONE) 20 MG tablet Take 2 tablets (40 mg total) by mouth daily with breakfast. (Patient not taking: Reported on 05/11/2015) 10 tablet 0  . tiZANidine (ZANAFLEX) 4 MG tablet Take 1 tablet (4 mg total) by mouth every 8 (eight) hours as needed (pain). (Patient not taking: Reported on 05/11/2015) 60 tablet 0  . HYDROcodone-acetaminophen (NORCO/VICODIN) 5-325 MG per tablet     . topiramate (TOPAMAX) 25 MG tablet Take 1 tablet (25 mg total) by mouth at bedtime. 30 tablet 0   No facility-administered medications prior to visit.    History   Social History  . Marital Status: Married    Spouse Name: N/A  . Number of Children: 3  . Years of Education: N/A   Social History Main Topics  . Smoking status: Current Every Day Smoker -- 0.50 packs/day    Types: Cigarettes  . Smokeless tobacco: Never Used     Comment: electronic cigarette  . Alcohol Use: No  . Drug Use: No  . Sexual Activity:    Partners: Male   Other Topics Concern  . None   Social History Narrative     Review of  Systems  Constitutional: Negative for fever, chills and appetite change.  HENT: Negative for congestion, ear pain, postnasal drip, sinus pressure and sore throat.   Eyes: Negative for pain and redness.  Respiratory: Negative for cough, shortness of breath and wheezing.   Cardiovascular: Negative for leg swelling.  Gastrointestinal: Negative for nausea, vomiting, abdominal pain, diarrhea, constipation and blood in stool.  Endocrine: Negative for polyuria.  Genitourinary: Negative for dysuria, urgency, frequency and flank pain.  Musculoskeletal: Positive for neck pain. Negative for  gait problem.  Skin: Negative for rash.  Neurological: Negative for weakness and headaches.  Psychiatric/Behavioral: Negative for confusion and decreased concentration. The patient is not nervous/anxious.     Objective:  BP 168/98 mmHg  Pulse 74  Temp(Src) 97.4 F (36.3 C) (Oral)  Resp 17  Ht 5' 5.5" (1.664 m)  Wt 190 lb 3.2 oz (86.274 kg)  BMI 31.16 kg/m2  SpO2 98%  Physical Exam  Constitutional: She is oriented to person, place, and time. She appears well-developed and well-nourished. No distress.  HENT:  Head: Normocephalic and atraumatic.  Right Ear: External ear normal.  Left Ear: External ear normal.  Nose: Nose normal.  Eyes: Conjunctivae and EOM are normal. Pupils are equal, round, and reactive to light. No scleral icterus.  Neck: Normal range of motion. Neck supple. No tracheal deviation present.  Cardiovascular: Normal rate, regular rhythm and normal heart sounds.   Pulmonary/Chest: Effort normal. No respiratory distress. She has no wheezes. She has no rales.  Abdominal: She exhibits no mass. There is no tenderness. There is no rebound and no guarding.  Musculoskeletal: She exhibits no edema.       Cervical back: She exhibits decreased range of motion, tenderness and spasm.  Lymphadenopathy:    She has no cervical adenopathy.  Neurological: She is alert and oriented to person, place, and time. Coordination normal.  Skin: Skin is warm and dry. No rash noted.  Psychiatric: She has a normal mood and affect. Her behavior is normal.      Assessment & Plan:   Tonni was seen today for torticollis and arm pain.  Diagnoses and all orders for this visit:  Torticollis, acute  Other orders -     naproxen sodium (ANAPROX DS) 550 MG tablet; Take 1 tablet (550 mg total) by mouth 2 (two) times daily with a meal. -     cyclobenzaprine (FLEXERIL) 10 MG tablet; Take 1 tablet (10 mg total) by mouth 3 (three) times daily as needed for muscle spasms. -      HYDROcodone-acetaminophen (NORCO) 5-325 MG per tablet; Take 1-2 tablets by mouth every 4 (four) hours as needed.   I have discontinued Ms. Kaman's HYDROcodone-acetaminophen and topiramate. I am also having her start on naproxen sodium, cyclobenzaprine, and HYDROcodone-acetaminophen. Additionally, I am having her maintain her aspirin EC, fish oil-omega-3 fatty acids, acetaminophen, tiZANidine, predniSONE, metoprolol succinate, atorvastatin, and clopidogrel.  Meds ordered this encounter  Medications  . naproxen sodium (ANAPROX DS) 550 MG tablet    Sig: Take 1 tablet (550 mg total) by mouth 2 (two) times daily with a meal.    Dispense:  40 tablet    Refill:  0  . cyclobenzaprine (FLEXERIL) 10 MG tablet    Sig: Take 1 tablet (10 mg total) by mouth 3 (three) times daily as needed for muscle spasms.    Dispense:  30 tablet    Refill:  0  . HYDROcodone-acetaminophen (NORCO) 5-325 MG per tablet    Sig: Take 1-2 tablets  by mouth every 4 (four) hours as needed.    Dispense:  30 tablet    Refill:  0    Appropriate red flag conditions were discussed with the patient as well as actions that should be taken.  Patient expressed his understanding.  Follow-up: Return if symptoms worsen or fail to improve.  Roselee Culver, MD

## 2015-05-11 NOTE — Patient Instructions (Signed)

## 2015-06-24 ENCOUNTER — Ambulatory Visit: Payer: Commercial Managed Care - HMO | Admitting: Internal Medicine

## 2015-06-27 ENCOUNTER — Ambulatory Visit: Payer: Commercial Managed Care - HMO | Admitting: Neurology

## 2015-07-12 ENCOUNTER — Encounter: Payer: Self-pay | Admitting: Internal Medicine

## 2015-07-12 ENCOUNTER — Ambulatory Visit (INDEPENDENT_AMBULATORY_CARE_PROVIDER_SITE_OTHER): Payer: Commercial Managed Care - HMO | Admitting: Internal Medicine

## 2015-07-12 VITALS — BP 122/74 | HR 66 | Temp 98.1°F | Resp 14 | Ht 66.0 in | Wt 188.1 lb

## 2015-07-12 DIAGNOSIS — D219 Benign neoplasm of connective and other soft tissue, unspecified: Secondary | ICD-10-CM | POA: Insufficient documentation

## 2015-07-12 DIAGNOSIS — M961 Postlaminectomy syndrome, not elsewhere classified: Secondary | ICD-10-CM | POA: Diagnosis not present

## 2015-07-12 DIAGNOSIS — D259 Leiomyoma of uterus, unspecified: Secondary | ICD-10-CM

## 2015-07-12 NOTE — Patient Instructions (Signed)
We will get you in with the gynecologist for the fibroid and get that taken care of.  Fibroids Fibroids are lumps (tumors) that can occur any place in a woman's body. These lumps are not cancerous. Fibroids vary in size, weight, and where they grow. HOME CARE  Do not take aspirin.  Write down the number of pads or tampons you use during your period. Tell your doctor. This can help determine the best treatment for you. GET HELP RIGHT AWAY IF:  You have pain in your lower belly (abdomen) that is not helped with medicine.  You have cramps that are not helped with medicine.  You have more bleeding between or during your period.  You feel lightheaded or pass out (faint).  Your lower belly pain gets worse. MAKE SURE YOU:  Understand these instructions.  Will watch your condition.  Will get help right away if you are not doing well or get worse. Document Released: 11/28/2010 Document Revised: 01/18/2012 Document Reviewed: 11/28/2010 [facility] Patient Information 2015 [place], [place]. This information is not intended to replace advice given to you by your health care provider. Make sure you discuss any questions you have with your health care provider.

## 2015-07-12 NOTE — Progress Notes (Signed)
   Subjective:    Patient ID: Kayla Hopkins, female    DOB: 31-Aug-1961, 54 y.o.   MRN: [id number]  HPI The patient is a 54 YO female coming in for follow up of mass found on MRI back. It was suspected to be a fibroid that was pedunculated. She does not have an ob/gyn. Denies excessive bleeding. Does get pressure in her low back. MRI did not show any problems with the back bones or nerves in the back. Pain daily and sometimes severe. Using tylenol and ibuprofen and they do not do very well.   Review of Systems  Constitutional: Negative for fever, activity change, appetite change, fatigue and unexpected weight change.  Respiratory: Negative for cough, chest tightness, shortness of breath and wheezing.   Cardiovascular: Negative for chest pain, palpitations and leg swelling.  Gastrointestinal: Positive for abdominal pain. Negative for nausea, diarrhea, constipation and abdominal distention.       Pressure posterior and low.   Musculoskeletal: Positive for myalgias and back pain.  Skin: Negative.   Psychiatric/Behavioral: Negative.       Objective:   Physical Exam  Constitutional: She appears well-developed and well-nourished.  HENT:  Head: Normocephalic and atraumatic.  Eyes: EOM are normal.  Neck: Normal range of motion.  Cardiovascular: Normal rate and regular rhythm.   Pulmonary/Chest: Effort normal and breath sounds normal.  Abdominal: Soft. Bowel sounds are normal. She exhibits no distension. There is tenderness.  Mild low back pressure.  Neurological: Coordination normal.  Skin: Skin is warm and dry.   Filed Vitals:   07/12/15 0947  BP: 122/74  Pulse: 66  Temp: 98.1 F (36.7 C)  TempSrc: Oral  Resp: 14  Height: 5\' 6"  (1.676 m)  Weight: 188 lb 1.9 oz (85.331 kg)  SpO2: 97%      Assessment & Plan:

## 2015-07-12 NOTE — Progress Notes (Signed)
Pre visit review using our clinic review tool, if applicable. No additional management support is needed unless otherwise documented below in the visit note. 

## 2015-07-12 NOTE — Assessment & Plan Note (Signed)
Referral to gyn for further evaluation. Not having any bleeding but is having some pressure/pain that could be related. 5.2 cm on MRI.

## 2015-07-12 NOTE — Assessment & Plan Note (Signed)
MRI shows no new changes to the back and pain may be coming from a fibroid.

## 2015-07-16 ENCOUNTER — Encounter: Payer: Self-pay | Admitting: Obstetrics & Gynecology

## 2015-07-16 ENCOUNTER — Ambulatory Visit (INDEPENDENT_AMBULATORY_CARE_PROVIDER_SITE_OTHER): Payer: Commercial Managed Care - HMO | Admitting: Obstetrics & Gynecology

## 2015-07-16 VITALS — BP 122/78 | HR 60 | Resp 16 | Ht 64.75 in | Wt 190.0 lb

## 2015-07-16 DIAGNOSIS — Z124 Encounter for screening for malignant neoplasm of cervix: Secondary | ICD-10-CM

## 2015-07-16 DIAGNOSIS — N901 Moderate vulvar dysplasia: Secondary | ICD-10-CM | POA: Diagnosis not present

## 2015-07-16 DIAGNOSIS — D495 Neoplasm of unspecified behavior of other genitourinary organs: Secondary | ICD-10-CM | POA: Diagnosis not present

## 2015-07-16 DIAGNOSIS — M545 Low back pain: Secondary | ICD-10-CM

## 2015-07-16 DIAGNOSIS — D4959 Neoplasm of unspecified behavior of other genitourinary organ: Secondary | ICD-10-CM

## 2015-07-16 DIAGNOSIS — D259 Leiomyoma of uterus, unspecified: Secondary | ICD-10-CM

## 2015-07-16 DIAGNOSIS — Z1151 Encounter for screening for human papillomavirus (HPV): Secondary | ICD-10-CM | POA: Diagnosis not present

## 2015-07-16 NOTE — Progress Notes (Signed)
Scheduled patient while in office for PUS on 08/01/2015 at 12:30 pm with 1 pm consult with Dr.Miller. Patient is agreeable to date and time. Aware she will receive a call from our billing department to discuss coverage for PUS.
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
## 2015-07-18 LAB — IPS PAP TEST WITH HPV

## 2015-07-19 ENCOUNTER — Telehealth: Payer: Self-pay | Admitting: Emergency Medicine

## 2015-07-19 DIAGNOSIS — N901 Moderate vulvar dysplasia: Secondary | ICD-10-CM

## 2015-07-19 NOTE — Telephone Encounter (Signed)
-----   Message from [name], MD sent at 07/19/2015 10:34 AM EDT ----- Left detailed message for pt on home phone number as indicated on release of records form.  Name confirmed on voice mail as well.  Results for pap and neg HR HPV given as well as for VIN2 findings with positive margins.  Informed pt she needs to have area re-excised as this needs to be fully removed.  As well she has another lesion on the other side of the vulva that needs removal.  Can plan in-office or in OR.  Advised pt to call back to indicate her desire.  She is on platelet agent for afib but this doesn't need to be stopped for procedure.    Sending to [employer], RN, if pt calls back with questions.  CC:  Maryann Conners.

## 2015-07-19 NOTE — Telephone Encounter (Signed)
Patient returned call. She would like to plan for procedures in our office.  Procedure appointment scheduled for 07/25/15.  Ultrasound scheduled for 08/01/15.   Patient advised of message from Dr. Sabra Heck, does not need to stop Plavix prior to procedure.  Orders for colposcopy of Vulva placed and orders for Vular lesion destruction x 2 placed for pre-cert.  Patient aware she will be contacted.  Any questions prior to appointment patient advised to call.   Routing to provider for final review. Patient agreeable to disposition. Will close encounter.

## 2015-07-25 ENCOUNTER — Ambulatory Visit (INDEPENDENT_AMBULATORY_CARE_PROVIDER_SITE_OTHER): Payer: Commercial Managed Care - HMO | Admitting: Obstetrics & Gynecology

## 2015-07-25 VITALS — BP 130/68 | HR 68 | Resp 16 | Wt 191.0 lb

## 2015-07-25 DIAGNOSIS — N9089 Other specified noninflammatory disorders of vulva and perineum: Secondary | ICD-10-CM | POA: Diagnosis not present

## 2015-07-25 DIAGNOSIS — N900 Mild vulvar dysplasia: Secondary | ICD-10-CM | POA: Diagnosis not present

## 2015-07-25 DIAGNOSIS — N901 Moderate vulvar dysplasia: Secondary | ICD-10-CM

## 2015-07-25 DIAGNOSIS — A630 Anogenital (venereal) warts: Secondary | ICD-10-CM | POA: Diagnosis not present

## 2015-07-25 MED ORDER — CEPHALEXIN 500 MG PO CAPS: 500.0000 mg | ORAL_CAPSULE | Freq: Four times a day (QID) | ORAL | Status: DC

## 2015-07-25 MED ORDER — HYDROCODONE-ACETAMINOPHEN 5-325 MG PO TABS: 1.0000 | ORAL_TABLET | Freq: Four times a day (QID) | ORAL | Status: DC | PRN

## 2015-07-25 MED ORDER — FLUCONAZOLE 150 MG PO TABS: 150.0000 mg | ORAL_TABLET | Freq: Once | ORAL | Status: DC

## 2015-07-25 NOTE — Progress Notes (Signed)
Subjective:     Patient ID: Kayla Hopkins, female   DOB: 22-Jul-1961, 54 y.o.   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  Lesion on the left is either going to need laser or WLE but felt best for pt to have complete evaluation before deciding upon OR procedure.    Consent obtained.  All questions answered.    Review of Systems     Objective:   Physical Exam  Constitutional: She is oriented to person, place, and time.  HENT:  Head: Normocephalic and atraumatic.  Genitourinary:     Lymphadenopathy:       Right: No inguinal adenopathy present.       Left: No inguinal adenopathy present.  Neurological: She is alert and oriented to person, place, and time.  Skin: Skin is warm and dry.  Psychiatric: She has a normal mood and affect.   Colposcopy: 3% acetic acid applied to entire vulva for >3 minutes with spraying and with soaked 4x4's.  Entire vulva visualized with 7.5X magnification.  Small area of AWE noted just to left of vaginal introitus.  Biopsy recommended.  Area cleansed with Betadine.  0.59ml 1% Lidocaine Lot [id number], exp 11/10/15 used.  50mm punch biopsy obtained and excised with sterile technique.  Silver nitrate used for excellent hemostasis.  Then larger lesion, inferiorly, was cleansed with Betadine x 3.  1.8ml 1% Lidocaine used.  Same lot at above.  Using sterile technique, lesion fully excised.  Lesion measured about 1.0cm.  Three single interrupted sutures of 3.0 Vicryl used for closure and excellent hemostasis.  Pt tolerated procedure well.  No dressing was applied due ot location of lesions.   Findings noted in picture above.     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  She will return for PUS next week and suture removal.

## 2015-07-30 ENCOUNTER — Telehealth: Payer: Self-pay | Admitting: Emergency Medicine

## 2015-07-30 NOTE — Telephone Encounter (Signed)
Called home number and left message to return call. No details left.

## 2015-07-30 NOTE — Telephone Encounter (Signed)
-----   Message from [name], MD sent at 07/29/2015 12:13 PM EDT ----- Please inform pt that lesion I removed and biopsied last week was a wart.  No other treatment is needed for this.  The other lesion that I biopsied needs to be excised in the OR.  Pt has ultrasound scheduled on Thursday so she and I will discuss further at that time.

## 2015-08-01 ENCOUNTER — Ambulatory Visit (INDEPENDENT_AMBULATORY_CARE_PROVIDER_SITE_OTHER): Payer: Commercial Managed Care - HMO | Admitting: Obstetrics & Gynecology

## 2015-08-01 ENCOUNTER — Ambulatory Visit (INDEPENDENT_AMBULATORY_CARE_PROVIDER_SITE_OTHER): Payer: Commercial Managed Care - HMO

## 2015-08-01 VITALS — BP 144/82 | HR 60 | Resp 25 | Wt 189.0 lb

## 2015-08-01 DIAGNOSIS — N901 Moderate vulvar dysplasia: Secondary | ICD-10-CM | POA: Diagnosis not present

## 2015-08-01 DIAGNOSIS — Z720 Tobacco use: Secondary | ICD-10-CM

## 2015-08-01 DIAGNOSIS — M545 Low back pain: Secondary | ICD-10-CM | POA: Diagnosis not present

## 2015-08-01 DIAGNOSIS — D259 Leiomyoma of uterus, unspecified: Secondary | ICD-10-CM

## 2015-08-01 DIAGNOSIS — D250 Submucous leiomyoma of uterus: Secondary | ICD-10-CM | POA: Diagnosis not present

## 2015-08-01 DIAGNOSIS — I739 Peripheral vascular disease, unspecified: Secondary | ICD-10-CM | POA: Diagnosis not present

## 2015-08-01 DIAGNOSIS — F17200 Nicotine dependence, unspecified, uncomplicated: Secondary | ICD-10-CM

## 2015-08-01 NOTE — Telephone Encounter (Signed)
Spoke with patient and message from Dr. Sabra Heck given.  Patient confirmed appointment for today. Will keep as scheduled to discuss with Dr. Sabra Heck.

## 2015-08-01 NOTE — Progress Notes (Signed)
54 y.o. Kayla Hopkins here for a pelvic ultrasound due to back pain and uterine fibroid that was noted on MRI of back done during the evaluation of the back pain.  Pt is menopausal.  Known hx of fibroid with CT from 09/01/13 showing fibroid measuring 2.7 x 3.5cm to be present.  Pt is PMP and has not had any bleeding since 2010.    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  The VIN 2 has not been fully treated at this time.  I think it could be removed with WLE or lasered as well.   Patient's last menstrual period was 11/09/2008.  Sexually active:  yes  Contraception: PMP status  FINDINGS: UTERUS: 5.7 x 4.8 x 3.6cm with 4.6x 4.0 x 3.4cm left fundal fibroid EMS: 6.53mm ADNEXA:   Left ovary 1.5 x 1.0 x 0.9cm with 53mm calcification and 17 x 16mm simple appearing cyst.  Findings are avascular.   Right ovary 3.4 x 2.5 x 2.1cm with mildly echogenic focus measuring 12 x 69mm and cystic lesion to right of ovary 20 x 40mm, possible hydrosalpinx.  Findings are avascular. CUL DE SAC: no free fluid  Findings reviewed with pt.  Fibroid has enlarged, since going into menopause.  Pt reports with ultrasound, her back pain was exacerbated and the pain feels like the exact same pain she has been experiencing and the exact same pain for which she sought evaluation.    Pt and I discussed options including following conservatively, hysterectomy, and removal of fibroid only.  Pt with hx of peripheral vascular disease and afib and she is on Plavix.  Feel less aggressive approach is safest for her and she is in agreement, partially, as she would like to have the fibroid removed.  Not desirous of hysterectomy.  Pt understands will need transitioning from Plavix to possible Lovenox and that she has increased surgical risks due to these medications including increased risks of bleeding, possible need for transfusion, and resulting anemia.  (pt  had recent sinus surgery and was on Lovenox for two weeks and did very well.)  She would still like to proceed with possible treatment via surgery.  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  Will need pt to see Dr. Fletcher Anon.    Will also go ahead and begin proceed of scheduling Robotic Myomectomy and then WLE of left vulvar lesion.  Plan endometrial biopsy pre-operatively  ~30 minutes spent with patient >50% of time was in face to face discussion of above.

## 2015-08-05 ENCOUNTER — Encounter: Payer: Self-pay | Admitting: Obstetrics & Gynecology

## 2015-08-05 DIAGNOSIS — F17200 Nicotine dependence, unspecified, uncomplicated: Secondary | ICD-10-CM | POA: Insufficient documentation

## 2015-08-05 NOTE — Addendum Note (Signed)
Addended by: Michele Mcalpine on: 08/05/2015 12:08 PM   Modules accepted: Orders

## 2015-08-09 ENCOUNTER — Telehealth: Payer: Self-pay | Admitting: Obstetrics & Gynecology

## 2015-08-09 NOTE — Telephone Encounter (Signed)
Spoke with patient regarding surgery benefit for Robotic Myomectomy/WLE of vulvar (VIN 2). Patient understood benefit but proceeded to have some clinical questions. She is concerned about the fibroid growing back in the future. She stated she was "afraid of the hysterectomy at first, but after speaking with my husband am more interested now". She would like to discuss these options again with Dr Sabra Heck. Offered to schedule a consult in the office, but patient prefers to leave a message for Dr Sabra Heck.  Patient aware this may change her insurance benefit and pre-cert would need to be redone if necessary. Patient understood and agreeable.  Routing to Dr Sabra Heck.

## 2015-08-13 ENCOUNTER — Ambulatory Visit (INDEPENDENT_AMBULATORY_CARE_PROVIDER_SITE_OTHER): Payer: Commercial Managed Care - HMO | Admitting: Cardiovascular Disease

## 2015-08-13 ENCOUNTER — Encounter: Payer: Self-pay | Admitting: Cardiovascular Disease

## 2015-08-13 VITALS — BP 130/84 | HR 66 | Ht 66.0 in | Wt 190.4 lb

## 2015-08-13 DIAGNOSIS — Z01810 Encounter for preprocedural cardiovascular examination: Secondary | ICD-10-CM | POA: Diagnosis not present

## 2015-08-13 DIAGNOSIS — E785 Hyperlipidemia, unspecified: Secondary | ICD-10-CM | POA: Diagnosis not present

## 2015-08-13 DIAGNOSIS — I10 Essential (primary) hypertension: Secondary | ICD-10-CM | POA: Diagnosis not present

## 2015-08-13 DIAGNOSIS — I739 Peripheral vascular disease, unspecified: Secondary | ICD-10-CM | POA: Diagnosis not present

## 2015-08-13 NOTE — Assessment & Plan Note (Signed)
Continue treatment with atorvastatin with a target LDL of less than 70. This is being managed by her primary care physician.

## 2015-08-13 NOTE — Assessment & Plan Note (Signed)
The patient had previous cardiac catheterization that showed no significant coronary artery disease. Stress test last year was normal. She currently has no symptoms suggestive of angina. Thus, she is considered at low risk for cardiovascular complications. No further workup is recommended before surgery. Going to discontinue Plavix. Continue low-dose aspirin. Surgery can be performed 7 days from now to allow the effect of Plavix to wear off.

## 2015-08-13 NOTE — Assessment & Plan Note (Signed)
Blood pressure is reasonably controlled on current medications. 

## 2015-08-13 NOTE — Patient Instructions (Signed)
Your physician has recommended you make the following change in your medication:  1.) Stop Plavix  [place]. DO NOT EAT OR DRINK AFTER MIDNIGHT ON THE DAY OF YOUR PROCEDURE EXCEPT FOR SIPS OF WATER OK TO TAKE MEDICINES LIKE NORMAL THAT DAY WITH A SIPS OF WATER.  Your physician wants you to follow-up in: 6 months with Dr. Fletcher Anon.  You will receive a reminder letter in the mail two months in advance. If you don't receive a letter, please call our office to schedule the follow-up appointment.

## 2015-08-13 NOTE — Progress Notes (Signed)
HPI  [name] is a 54 year old female who is here today for a followup visit and preoperative cardiovascular evaluation before hysterectomy.  She has no known CAD per cath back in 2009. She does have known history of peripheral arterial disease, paroxysmal atrial fibrillation, hypertension and hyperlipidemia.  She is followed for bilateral iliac stenting. She underwent bilateral kissing stent placement with an 8 x 24 mm Genesis stent in 2010.  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 She had successful balloon angioplasty without significant residual stenosis. She developed recurrent in-stent restenosis. This was treated with a covered stent placement. There was moderate stenosis in distal left common iliac artery at that time.  She was seen for severe left hip and thigh claudication in 04/2013.  ABI significantly dropped on the left to 0.61 with evidence of severe stenosis in distal left common iliac artery.  She underwent angiography in 05/2013 which showed 70% stenosis in the distal left common iliac artery just before the origin of the internal iliac artery. There was greater than 40 mm systolic gradient across the lesion. Thus, this was treated with a self-expanding stent placement without complications.  Most recent noninvasive evaluation in 06/2014 showed normal ABI with borderline stenosis in right common iliac artery.  Most recent nuclear stress test in May 2015 showed no evidence of ischemia with normal ejection fraction. The patient was diagnosed recently with a fibroid tumor and needs to have hysterectomy. She denies any chest pain. She has stable exertional dyspnea.    Allergies  Allergen Reactions  . Pradaxa [Dabigatran Etexilate Mesylate]     Gastritis   . Zocor [Simvastatin]     Leg pains      Current Outpatient Prescriptions on File Prior to Visit  Medication Sig Dispense  Refill  . acetaminophen (TYLENOL) 325 MG tablet Take 2 tablets (650 mg total) by mouth every 6 (six) hours as needed for mild pain (or Fever >/= 101).    [name] aspirin EC 81 MG tablet Take 81 mg by mouth every morning.     [name] atorvastatin (LIPITOR) 40 MG tablet TAKE 1 TABLET EVERY DAY  AT  6PM 90 tablet 1  . clopidogrel (PLAVIX) 75 MG tablet TAKE 1 TABLET EVERY DAY 90 tablet 1  . fish oil-omega-3 fatty acids 1000 MG capsule Take 1 g by mouth every morning.     [name] HYDROcodone-acetaminophen (NORCO/VICODIN) 5-325 MG per tablet Take 1-2 tablets by mouth every 6 (six) hours as needed for moderate pain or severe pain. 15 tablet 0  . metoprolol succinate (TOPROL-XL) 50 MG 24 hr tablet TAKE 1 TABLET (50 MG TOTAL) BY MOUTH DAILY. TAKE WITH OR IMMEDIATELY FOLLOWING A MEAL. 90 tablet 1   No current facility-administered medications on file prior to visit.     Past Medical History  Diagnosis Date  . PAF (paroxysmal atrial fibrillation) ([place])   . Hypertension   . Peripheral vascular disease ([place]) 06/11/2009    Bilateral common iliac kissing stents (8X24 mm) . 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 She underwent successful balloon angioplasty.  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   . Hyperlipidemia   . Tobacco abuse   . Claudication ([place])     clots in legs  . S/P cardiac cath 2009  Normal  . Arthritis   . Depression   . Headache   . Anxiety   . Migraine     tried several medications-no relief  . Herpes     no outbreaks-has Valtrex to take PRN     Past Surgical History  Procedure Laterality Date  . Cardiac catheterization  2009    with stents  . Lumbar disc surgery      spinal stenosis  . Knee arthroscopy Bilateral   . Abdominal angiogram N/A 05/11/2012    Procedure: ABDOMINAL ANGIOGRAM;  Surgeon: [name], MD;  Location:  [facility] CATH LAB;  Service: Cardiovascular;  Laterality: N/A;  . Abdominal aortagram N/A 10/19/2012    Procedure: ABDOMINAL Maxcine Ham;  Surgeon: [name], MD;  Location: [facility] CATH LAB;  Service: Cardiovascular;  Laterality: N/A;  . Abdominal aortagram N/A 05/10/2013    Procedure: ABDOMINAL Maxcine Ham;  Surgeon: [name], MD;  Location: [facility] CATH LAB;  Service: Cardiovascular;  Laterality: N/A;     Family History  Problem Relation Age of Onset  . Hypertension Mother   . Heart disease Father   . Heart attack Father   . Hypertension Brother   . Ovarian cancer Mother   . Lung cancer Mother   . Colon polyps Sister   . Cancer Maternal Grandfather     lung  . Cancer Paternal Grandfather     throat      Social History   Social History  . Marital Status: Married    Spouse Name: N/A  . Number of Children: 3  . Years of Education: N/A   Occupational History  . Not on file.   Social History Main Topics  . Smoking status: Former Smoker -- 0.50 packs/day    Types: Cigarettes  . Smokeless tobacco: Never Used     Comment: electronic cigarette  . Alcohol Use: No  . Drug Use: No  . Sexual Activity:    Partners: Male   Other Topics Concern  . Not on file   Social History Narrative       PHYSICAL EXAM   BP 130/84 mmHg  Pulse 66  Ht 5\' 6"  (1.676 m)  Wt 190 lb 6.4 oz (86.365 kg)  BMI 30.75 kg/m2  LMP 11/09/2008 Constitutional: She is oriented to person, place, and time. She appears well-developed and well-nourished. No distress.  HENT: No nasal discharge.  Head: Normocephalic and atraumatic.  Eyes: Pupils are equal and round. Right eye exhibits no discharge. Left eye exhibits no discharge.  Neck: Normal range of motion. Neck supple. No JVD present. No thyromegaly present.  Cardiovascular: Normal rate, regular rhythm, normal heart sounds. Exam reveals no gallop and no friction rub. No murmur heard.  Pulmonary/Chest: Effort normal and breath sounds normal. No stridor.  No respiratory distress. She has no wheezes. She has no rales. She exhibits no tenderness.  Abdominal: Soft. Bowel sounds are normal. She exhibits no distension. There is no tenderness. There is no rebound and no guarding.  Musculoskeletal: Normal range of motion. She exhibits no edema and no tenderness.  Neurological: She is alert and oriented to person, place, and time. Coordination normal.  Skin: Skin is warm and dry. No rash noted. She is not diaphoretic. No erythema. No pallor.  Psychiatric: She has a normal mood and affect. Her behavior is normal. Judgment and thought content normal.  Vascular: Femoral pulses:  very diminished on both sides. Distal pulses are palpable on the left but not the right.  EKG from  April was reviewed and showed sinus rhythm with PACs.  ASSESSMENT AND PLAN

## 2015-08-13 NOTE — Assessment & Plan Note (Signed)
She has no claudication currently. She is due for an aortoiliac duplex. I discontinued Plavix. Continue low-dose aspirin.

## 2015-08-14 NOTE — Telephone Encounter (Signed)
In response to her question--once her fibroid is removed, it will not "grow back".  The risk is for future fibroids to grow but this is much less likely once someone is in menopause.

## 2015-08-15 NOTE — Telephone Encounter (Signed)
Call to patient. Advised of response from Dr Sabra Heck. Brief discussion of myomectomy versus hysterectomy. Offered office visit to discuss if still undecided. Patient declines. Advised she will have the opportunity to confirm with Dr Sabra Heck at surgery consult prior to surgery. Patient desires to proceed with robotic hysterectomy. Scheduling policies and date options discussed. Patient agreeable to policy and desires September 23, 2015. Surgery instruction sheet reviewed and printed copy will be mailed, see copy scanned. Patient had cardiology appointment on 08-13-15 was cleared for surgery. Scheuled for aorta iliac duplex on 08-20-15.       Routing to provider for final review. Patient agreeable to disposition. Will close encounter.

## 2015-08-15 NOTE — Telephone Encounter (Signed)
Patient had questions prior to scheduling surgery. Please see Dr Ammie Ferrier response.  Routing to [employer]

## 2015-08-20 ENCOUNTER — Ambulatory Visit (HOSPITAL_COMMUNITY)
Admission: RE | Admit: 2015-08-20 | Discharge: 2015-08-20 | Disposition: A | Discharge status: Home / Self Care | Payer: Commercial Managed Care - HMO | Source: Ambulatory Visit | Attending: Cardiovascular Disease | Admitting: Cardiovascular Disease

## 2015-08-20 DIAGNOSIS — F17200 Nicotine dependence, unspecified, uncomplicated: Secondary | ICD-10-CM | POA: Diagnosis not present

## 2015-08-20 DIAGNOSIS — I739 Peripheral vascular disease, unspecified: Secondary | ICD-10-CM

## 2015-08-20 DIAGNOSIS — I10 Essential (primary) hypertension: Secondary | ICD-10-CM | POA: Insufficient documentation

## 2015-08-20 DIAGNOSIS — E785 Hyperlipidemia, unspecified: Secondary | ICD-10-CM | POA: Diagnosis not present

## 2015-08-30 ENCOUNTER — Ambulatory Visit (INDEPENDENT_AMBULATORY_CARE_PROVIDER_SITE_OTHER): Payer: Commercial Managed Care - HMO | Admitting: Obstetrics & Gynecology

## 2015-08-30 ENCOUNTER — Encounter: Payer: Self-pay | Admitting: Obstetrics & Gynecology

## 2015-08-30 VITALS — BP 118/78 | HR 60 | Resp 16 | Wt 195.0 lb

## 2015-08-30 DIAGNOSIS — N901 Moderate vulvar dysplasia: Secondary | ICD-10-CM

## 2015-08-30 DIAGNOSIS — M545 Low back pain: Secondary | ICD-10-CM

## 2015-08-30 DIAGNOSIS — D250 Submucous leiomyoma of uterus: Secondary | ICD-10-CM | POA: Diagnosis not present

## 2015-08-30 DIAGNOSIS — I739 Peripheral vascular disease, unspecified: Secondary | ICD-10-CM

## 2015-08-30 MED ORDER — IBUPROFEN 800 MG PO TABS: 800.0000 mg | ORAL_TABLET | Freq: Three times a day (TID) | ORAL | Status: DC | PRN

## 2015-08-30 MED ORDER — OXYCODONE-ACETAMINOPHEN 5-325 MG PO TABS: 2.0000 | ORAL_TABLET | ORAL | Status: DC | PRN

## 2015-08-30 MED ORDER — HYDROCODONE-ACETAMINOPHEN 5-325 MG PO TABS: 1.0000 | ORAL_TABLET | Freq: Four times a day (QID) | ORAL | Status: DC | PRN

## 2015-08-30 NOTE — Progress Notes (Signed)
54 y.o. G3P3 MarriedCaucasian female here for discussion of upcoming procedure.  Robotic Assisted Total Hysterectomy with Salpingectomy planned due to robotic TLH/bilateral salpignecotmy/possible BSO/cystoscopy due to enlarging uterine fibroid in patient who is menopausal as well as increasing lower back pain.  Pt is aware that she may not have resolution of her back pain with surgery.  Myomectomy and uterine artery embolization have been discussed as well for treatment.  She has opted for definitive surgical treatment.   Pt did have cardiac clearance from Dr. Fletcher Anon 08/13/15.  She was taken off her Plavix, long term, at that time.  She is now just on an aspirin.  She will stop this 7 days prior to surgery.  Pt has peripheral artery disease and bilateral inguinal stents.  She finally stopped smoking in May of this year.    Pt did have ultrasound performed 08/01/15 showing a 6 x 5 x 4cm uterine with 5 x 4 x 3.4cm left fundal fibroids.  Comparison to previous imaging (CT done 11/14) showed this to measure 3.5 x 2.7cm.  During examination, she had condylomatous appearing lesions and a larger, pigmented lesion on the right vulva.  The condylomas were removed.  The right lesion showed VIN2 with biopsy.  Excision in the OR will be planned for this as well.    Procedure discussed with patient.  Hospital stay, recovery and pain management all discussed.  Risks discussed including but not limited to bleeding, 1% risk of receiving a  transfusion, infection, 3-4% risk of bowel/bladder/ureteral/vascular injury discussed as well as possible need for additional surgery if injury does occur discussed.  DVT/PE and rare risk of death discussed.  My actual complications with prior surgeries discussed.  Vaginal cuff dehiscence discussed.  Hernia formation discussed.  Positioning and incision locations discussed.  Patient aware if pathology abnormal she may need additional treatment.  All questions answered.   Ob Hx:   Patient's  last menstrual period was 11/09/2008.          Sexually active: Yes.   Birth control: postmenopausal Last pap: 07/16/15 WNL/negative HR HPV  Vulva biopsy-VIN 2, positive margins Last MMG:03/26/15 3D diag MMG/US-BiRads 3 probably benign, needs 6 month follow up left breast diag/us Tobacco: quit in 5/16  Past Surgical History  Procedure Laterality Date  . Cardiac catheterization  2009    with stents  . Lumbar disc surgery      spinal stenosis  . Knee arthroscopy Bilateral   . Abdominal angiogram N/A 05/11/2012    Procedure: ABDOMINAL ANGIOGRAM;  Surgeon: [name], MD;  Location: [facility] CATH LAB;  Service: Cardiovascular;  Laterality: N/A;  . Abdominal aortagram N/A 10/19/2012    Procedure: ABDOMINAL Maxcine Ham;  Surgeon: [name], MD;  Location: [place] [facility] CATH LAB;  Service: Cardiovascular;  Laterality: N/A;  . Abdominal aortagram N/A 05/10/2013    Procedure: ABDOMINAL Maxcine Ham;  Surgeon: [name], MD;  Location: [place] [facility] CATH LAB;  Service: Cardiovascular;  Laterality: N/A;    Past Medical History  Diagnosis Date  . PAF (paroxysmal atrial fibrillation) ([place])   . Hypertension   . Peripheral vascular disease ([place]) 06/11/2009    Bilateral common iliac kissing stents (8X24 mm) . 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 She underwent successful balloon angioplasty.  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   . Hyperlipidemia   . Tobacco abuse   .  Claudication ([place])     clots in legs  . S/P cardiac cath 2009    Normal  . Arthritis   . Depression   . Headache   . Anxiety   . Migraine     tried several medications-no relief  . Herpes     no outbreaks-has Valtrex to take PRN    Allergies: Pradaxa and Zocor  Current Outpatient Prescriptions  Medication Sig Dispense Refill  . acetaminophen (TYLENOL) 325 MG tablet Take 2  tablets (650 mg total) by mouth every 6 (six) hours as needed for mild pain (or Fever >/= 101).    [name] aspirin EC 81 MG tablet Take 81 mg by mouth every morning.     [name] atorvastatin (LIPITOR) 40 MG tablet TAKE 1 TABLET EVERY DAY  AT  6PM 90 tablet 1  . fish oil-omega-3 fatty acids 1000 MG capsule Take 1 g by mouth every morning.     . metoprolol succinate (TOPROL-XL) 50 MG 24 hr tablet TAKE 1 TABLET (50 MG TOTAL) BY MOUTH DAILY. TAKE WITH OR IMMEDIATELY FOLLOWING A MEAL. 90 tablet 1   No current facility-administered medications for this visit.    ROS: Pertinent items noted in HPI and remainder of comprehensive ROS otherwise negative.  Exam:    LMP 11/09/2008  General appearance: alert and cooperative Head: Normocephalic, without obvious abnormality, atraumatic Neck: no adenopathy, supple, symmetrical, trachea midline and thyroid not enlarged, symmetric, no tenderness/mass/nodules Lungs: clear to auscultation bilaterally Heart: regular rate and rhythm, S1, S2 normal, no murmur, click, rub or gallop Abdomen: soft, non-tender; bowel sounds normal; no masses,  no organomegaly Extremities: extremities normal, atraumatic, no cyanosis or edema Skin: Skin color, texture, turgor normal. No rashes or lesions Lymph nodes: Cervical, supraclavicular, and axillary nodes normal. no inguinal nodes palpated Neurologic: Grossly normal  Pelvic: External genitalia:  Pigmented lesion about 3cm on right vulva inferior to vagina, no other lesions noted today              Urethra: normal appearing urethra with no masses, tenderness or lesions              Bartholins and Skenes: normal                 Vagina: normal appearing vagina with normal color and discharge, no lesions, cervix and uterus are very high in vagina with minimal descensus               Cervix: normal appearance              Pap taken: No.        Bimanual Exam:  Uterus:  uterus is normal size, shape, consistency and non-tender, cannot palpate  fibroid                                     Adnexa:    normal adnexa in size, nontender and no masses                                      Rectovaginal: Deferred                                      Anus:  normal sphincter tone, no lesions  A: Enlarging  uterine fibroid Minimal uterine descensus VIN 2, biopsy proven H/O peripheral artery disease s/p iliac stenting Former smoker, quit 5/16 Hypertension H/o paroxysmal afib, normal rate/rhythm currently  P:  Robotic assisted total laparoscopic hysterectomy with bilateral salpingectomy, possible BSO, cystoscopy, WLE right vulvar lesion planned Rx for Motrin and Percocet given. Medications/Vitamins reviewed.  Pt knows needs to stop aspirin one week before surgery. Hysterectomy brochure given for pre and post op instructions.  ~25 minutes spent with patient >50% of time was in face to face discussion of above.

## 2015-09-10 NOTE — Patient Instructions (Addendum)
Your procedure is scheduled on:  Monday, NOV. 14, 2016  Enter through the Main Entrance of [facility] at:  6:00 AM  Pick up the phone at the desk and dial [phone].  Call this number if you have problems the morning of surgery: [phone].  Remember: Do NOT eat food or drink after:  MIDNIGHT Sunday, NOV. 13, 2016 Take these medicines the morning of surgery with a SIP OF WATER: None  *STOP TAKING FISH OIL AT THIS TIME  Do NOT wear jewelry (body piercing), metal hair clips/bobby pins, make-up, or nail polish. Do NOT wear lotions, powders, or perfumes.  You may wear deoderant. Do NOT shave for 48 hours prior to surgery. Do NOT bring valuables to the hospital. Contacts, dentures, or bridgework may not be worn into surgery. Leave suitcase in car.  After surgery it may be brought to your room.  For patients admitted to the hospital, checkout time is 11:00 AM the day of discharge.

## 2015-09-11 ENCOUNTER — Encounter (HOSPITAL_COMMUNITY)
Admission: RE | Admit: 2015-09-11 | Discharge: 2015-09-11 | Disposition: A | Discharge status: Home / Self Care | Payer: Commercial Managed Care - HMO | Source: Ambulatory Visit | Attending: Obstetrics & Gynecology | Admitting: Obstetrics & Gynecology

## 2015-09-11 ENCOUNTER — Encounter (HOSPITAL_COMMUNITY): Payer: Self-pay

## 2015-09-11 DIAGNOSIS — Z01818 Encounter for other preprocedural examination: Secondary | ICD-10-CM | POA: Diagnosis not present

## 2015-09-11 DIAGNOSIS — N901 Moderate vulvar dysplasia: Secondary | ICD-10-CM | POA: Diagnosis not present

## 2015-09-11 LAB — CBC
HCT: 45.5 % (ref 36.0–46.0)
Hemoglobin: 15.3 g/dL — ABNORMAL HIGH (ref 12.0–15.0)
MCH: 30.3 pg (ref 26.0–34.0)
MCHC: 33.6 g/dL (ref 30.0–36.0)
MCV: 90.1 fL (ref 78.0–100.0)
PLATELETS: 251 10*3/uL (ref 150–400)
RBC: 5.05 MIL/uL (ref 3.87–5.11)
RDW: 14.3 % (ref 11.5–15.5)
WBC: 9.8 10*3/uL (ref 4.0–10.5)

## 2015-09-11 LAB — BASIC METABOLIC PANEL
Anion gap: 4 — ABNORMAL LOW (ref 5–15)
BUN: 18 mg/dL (ref 6–20)
CO2: 25 mmol/L (ref 22–32)
Calcium: 8.7 mg/dL — ABNORMAL LOW (ref 8.9–10.3)
Chloride: 105 mmol/L (ref 101–111)
Creatinine, Ser: 0.74 mg/dL (ref 0.44–1.00)
GFR calc Af Amer: >60 mL/min (ref >60)
GFR calc non Af Amer: >60 mL/min (ref >60)
Glucose, Bld: 102 mg/dL — ABNORMAL HIGH (ref 65–99)
Potassium: 4.3 mmol/L (ref 3.5–5.1)
Sodium: 134 mmol/L — ABNORMAL LOW (ref 135–145)

## 2015-09-22 MED ORDER — DEXTROSE 5 % IV SOLN
2.0000 g | INTRAVENOUS | Status: AC
  Administered 2015-09-23: 2 g via INTRAVENOUS
  Filled 2015-09-22: qty 2

## 2015-09-23 ENCOUNTER — Encounter (HOSPITAL_COMMUNITY): Payer: Self-pay | Admitting: Anesthesiology

## 2015-09-23 ENCOUNTER — Ambulatory Visit (HOSPITAL_COMMUNITY)
Admission: RE | Admit: 2015-09-23 | Discharge: 2015-09-23 | Disposition: A | Discharge status: Home / Self Care | Payer: Commercial Managed Care - HMO | Source: Ambulatory Visit | Attending: Obstetrics & Gynecology | Admitting: Obstetrics & Gynecology

## 2015-09-23 ENCOUNTER — Encounter (HOSPITAL_COMMUNITY)
Admission: RE | Disposition: A | Discharge status: Home / Self Care | Payer: Self-pay | Source: Ambulatory Visit | Attending: Obstetrics & Gynecology

## 2015-09-23 ENCOUNTER — Ambulatory Visit (HOSPITAL_COMMUNITY): Payer: Commercial Managed Care - HMO | Admitting: Anesthesiology

## 2015-09-23 DIAGNOSIS — D270 Benign neoplasm of right ovary: Secondary | ICD-10-CM | POA: Diagnosis not present

## 2015-09-23 DIAGNOSIS — N83299 Other ovarian cyst, unspecified side: Secondary | ICD-10-CM | POA: Diagnosis not present

## 2015-09-23 DIAGNOSIS — N901 Moderate vulvar dysplasia: Secondary | ICD-10-CM | POA: Diagnosis not present

## 2015-09-23 DIAGNOSIS — Z87891 Personal history of nicotine dependence: Secondary | ICD-10-CM | POA: Diagnosis not present

## 2015-09-23 DIAGNOSIS — N839 Noninflammatory disorder of ovary, fallopian tube and broad ligament, unspecified: Secondary | ICD-10-CM | POA: Insufficient documentation

## 2015-09-23 DIAGNOSIS — N736 Female pelvic peritoneal adhesions (postinfective): Secondary | ICD-10-CM | POA: Diagnosis not present

## 2015-09-23 DIAGNOSIS — I10 Essential (primary) hypertension: Secondary | ICD-10-CM | POA: Insufficient documentation

## 2015-09-23 DIAGNOSIS — I480 Paroxysmal atrial fibrillation: Secondary | ICD-10-CM | POA: Insufficient documentation

## 2015-09-23 DIAGNOSIS — D259 Leiomyoma of uterus, unspecified: Secondary | ICD-10-CM | POA: Diagnosis not present

## 2015-09-23 DIAGNOSIS — I739 Peripheral vascular disease, unspecified: Secondary | ICD-10-CM | POA: Diagnosis not present

## 2015-09-23 DIAGNOSIS — D271 Benign neoplasm of left ovary: Secondary | ICD-10-CM | POA: Diagnosis not present

## 2015-09-23 DIAGNOSIS — M545 Low back pain: Secondary | ICD-10-CM | POA: Insufficient documentation

## 2015-09-23 DIAGNOSIS — N838 Other noninflammatory disorders of ovary, fallopian tube and broad ligament: Secondary | ICD-10-CM | POA: Diagnosis present

## 2015-09-23 HISTORY — PX: VULVECTOMY: SHX1086

## 2015-09-23 HISTORY — PX: ROBOTIC ASSISTED TOTAL HYSTERECTOMY WITH SALPINGECTOMY: SHX6679

## 2015-09-23 LAB — HEMOGLOBIN: Hemoglobin: 14.2 g/dL (ref 12.0–15.0)

## 2015-09-23 LAB — PREGNANCY, URINE: PREG TEST UR: NEGATIVE

## 2015-09-23 SURGERY — ROBOTIC ASSISTED TOTAL HYSTERECTOMY WITH SALPINGECTOMY
Anesthesia: General | Site: Vagina

## 2015-09-23 MED ORDER — PROPOFOL 10 MG/ML IV BOLUS
INTRAVENOUS | Status: AC
  Filled 2015-09-23: qty 20

## 2015-09-23 MED ORDER — GLYCOPYRROLATE 0.2 MG/ML IJ SOLN
INTRAMUSCULAR | Status: AC
  Filled 2015-09-23: qty 4

## 2015-09-23 MED ORDER — NEOSTIGMINE METHYLSULFATE 10 MG/10ML IV SOLN
INTRAVENOUS | Status: DC | PRN
  Administered 2015-09-23: 4 mg via INTRAVENOUS

## 2015-09-23 MED ORDER — SCOPOLAMINE 1 MG/3DAYS TD PT72
MEDICATED_PATCH | TRANSDERMAL | Status: AC
  Administered 2015-09-23: 1.5 mg via TRANSDERMAL
  Filled 2015-09-23: qty 1

## 2015-09-23 MED ORDER — FENTANYL CITRATE (PF) 100 MCG/2ML IJ SOLN
INTRAMUSCULAR | Status: DC | PRN
  Administered 2015-09-23: 100 ug via INTRAVENOUS
  Administered 2015-09-23 (×2): 50 ug via INTRAVENOUS

## 2015-09-23 MED ORDER — SODIUM CHLORIDE 0.9 % IJ SOLN
INTRAMUSCULAR | Status: AC
  Filled 2015-09-23: qty 50

## 2015-09-23 MED ORDER — ROCURONIUM BROMIDE 100 MG/10ML IV SOLN
INTRAVENOUS | Status: AC
  Filled 2015-09-23: qty 1

## 2015-09-23 MED ORDER — MORPHINE SULFATE (PF) 4 MG/ML IV SOLN
2.0000 mg | INTRAVENOUS | Status: DC | PRN
  Administered 2015-09-23: 2 mg via INTRAVENOUS
  Filled 2015-09-23: qty 1

## 2015-09-23 MED ORDER — DEXAMETHASONE SODIUM PHOSPHATE 4 MG/ML IJ SOLN
INTRAMUSCULAR | Status: DC | PRN
  Administered 2015-09-23: 10 mg via INTRAVENOUS

## 2015-09-23 MED ORDER — DEXAMETHASONE SODIUM PHOSPHATE 10 MG/ML IJ SOLN
INTRAMUSCULAR | Status: AC
  Filled 2015-09-23: qty 1

## 2015-09-23 MED ORDER — ONDANSETRON HCL 4 MG/2ML IJ SOLN
INTRAMUSCULAR | Status: DC | PRN
  Administered 2015-09-23: 4 mg via INTRAVENOUS

## 2015-09-23 MED ORDER — FENTANYL CITRATE (PF) 250 MCG/5ML IJ SOLN
INTRAMUSCULAR | Status: AC
  Filled 2015-09-23: qty 25

## 2015-09-23 MED ORDER — HYDROMORPHONE HCL 1 MG/ML IJ SOLN
INTRAMUSCULAR | Status: AC
  Filled 2015-09-23: qty 1

## 2015-09-23 MED ORDER — ACETIC ACID 5 % SOLN
Status: AC
  Filled 2015-09-23: qty 500

## 2015-09-23 MED ORDER — ROPIVACAINE HCL 5 MG/ML IJ SOLN
INTRAMUSCULAR | Status: AC
  Filled 2015-09-23: qty 60

## 2015-09-23 MED ORDER — ARTIFICIAL TEARS OP OINT
TOPICAL_OINTMENT | OPHTHALMIC | Status: AC
  Filled 2015-09-23: qty 3.5

## 2015-09-23 MED ORDER — LIDOCAINE HCL (CARDIAC) 20 MG/ML IV SOLN
INTRAVENOUS | Status: AC
  Filled 2015-09-23: qty 5

## 2015-09-23 MED ORDER — SODIUM CHLORIDE 0.9 % IJ SOLN
INTRAMUSCULAR | Status: AC
  Filled 2015-09-23: qty 10

## 2015-09-23 MED ORDER — MIDAZOLAM HCL 2 MG/2ML IJ SOLN
INTRAMUSCULAR | Status: AC
  Filled 2015-09-23: qty 4

## 2015-09-23 MED ORDER — SIMETHICONE 80 MG PO CHEW
80.0000 mg | CHEWABLE_TABLET | Freq: Four times a day (QID) | ORAL | Status: DC | PRN
  Filled 2015-09-23: qty 1

## 2015-09-23 MED ORDER — KETOROLAC TROMETHAMINE 30 MG/ML IJ SOLN: 30.0000 mg | Freq: Four times a day (QID) | INTRAMUSCULAR | Status: DC

## 2015-09-23 MED ORDER — MEPERIDINE HCL 25 MG/ML IJ SOLN
12.5000 mg | Freq: Once | INTRAMUSCULAR | Status: AC
  Administered 2015-09-23: 12.5 mg via INTRAVENOUS

## 2015-09-23 MED ORDER — METOPROLOL SUCCINATE ER 50 MG PO TB24
50.0000 mg | ORAL_TABLET | Freq: Every day | ORAL | Status: DC
  Filled 2015-09-23: qty 1

## 2015-09-23 MED ORDER — PROPOFOL 500 MG/50ML IV EMUL: INTRAVENOUS | Status: DC | PRN

## 2015-09-23 MED ORDER — PROMETHAZINE HCL 25 MG/ML IJ SOLN: 6.2500 mg | INTRAMUSCULAR | Status: DC | PRN

## 2015-09-23 MED ORDER — LIDOCAINE HCL (CARDIAC) 20 MG/ML IV SOLN
INTRAVENOUS | Status: DC | PRN
  Administered 2015-09-23 (×2): 50 mg via INTRAVENOUS

## 2015-09-23 MED ORDER — OXYCODONE HCL 5 MG PO TABS: 5.0000 mg | ORAL_TABLET | Freq: Once | ORAL | Status: DC | PRN

## 2015-09-23 MED ORDER — NEOSTIGMINE METHYLSULFATE 10 MG/10ML IV SOLN
INTRAVENOUS | Status: AC
  Filled 2015-09-23: qty 1

## 2015-09-23 MED ORDER — ROCURONIUM BROMIDE 100 MG/10ML IV SOLN
INTRAVENOUS | Status: DC | PRN
  Administered 2015-09-23: 5 mg via INTRAVENOUS
  Administered 2015-09-23: 10 mg via INTRAVENOUS
  Administered 2015-09-23: 35 mg via INTRAVENOUS
  Administered 2015-09-23 (×3): 10 mg via INTRAVENOUS

## 2015-09-23 MED ORDER — STERILE WATER FOR IRRIGATION IR SOLN
Status: DC | PRN
  Administered 2015-09-23: 1000 mL

## 2015-09-23 MED ORDER — OXYCODONE HCL 5 MG/5ML PO SOLN: 5.0000 mg | Freq: Once | ORAL | Status: DC | PRN

## 2015-09-23 MED ORDER — EPHEDRINE SULFATE 50 MG/ML IJ SOLN
INTRAMUSCULAR | Status: DC | PRN
  Administered 2015-09-23 (×2): 5 mg via INTRAVENOUS

## 2015-09-23 MED ORDER — LIDOCAINE HCL 1 % IJ SOLN
INTRAMUSCULAR | Status: AC
  Filled 2015-09-23: qty 20

## 2015-09-23 MED ORDER — ONDANSETRON HCL 4 MG/2ML IJ SOLN
INTRAMUSCULAR | Status: AC
  Filled 2015-09-23: qty 2

## 2015-09-23 MED ORDER — HYDROMORPHONE HCL 1 MG/ML IJ SOLN
INTRAMUSCULAR | Status: AC
  Administered 2015-09-23: 0.5 mg via INTRAVENOUS
  Filled 2015-09-23: qty 1

## 2015-09-23 MED ORDER — OXYCODONE-ACETAMINOPHEN 5-325 MG PO TABS
1.0000 | ORAL_TABLET | ORAL | Status: DC | PRN
  Administered 2015-09-23: 2 via ORAL
  Filled 2015-09-23: qty 2

## 2015-09-23 MED ORDER — MIDAZOLAM HCL 5 MG/5ML IJ SOLN
INTRAMUSCULAR | Status: DC | PRN
  Administered 2015-09-23: 2 mg via INTRAVENOUS

## 2015-09-23 MED ORDER — INFLUENZA VAC SPLIT QUAD 0.5 ML IM SUSY
0.5000 mL | PREFILLED_SYRINGE | Freq: Once | INTRAMUSCULAR | Status: AC
  Administered 2015-09-23: 0.5 mL via INTRAMUSCULAR
  Filled 2015-09-23: qty 0.5

## 2015-09-23 MED ORDER — KETOROLAC TROMETHAMINE 30 MG/ML IJ SOLN
30.0000 mg | Freq: Four times a day (QID) | INTRAMUSCULAR | Status: DC
  Administered 2015-09-23 (×2): 30 mg via INTRAVENOUS
  Filled 2015-09-23: qty 1

## 2015-09-23 MED ORDER — SODIUM CHLORIDE 0.9 % IV SOLN
INTRAVENOUS | Status: DC | PRN
  Administered 2015-09-23: 69 mL

## 2015-09-23 MED ORDER — LACTATED RINGERS IR SOLN
Status: DC | PRN
  Administered 2015-09-23: 3000 mL

## 2015-09-23 MED ORDER — KETOROLAC TROMETHAMINE 30 MG/ML IJ SOLN
INTRAMUSCULAR | Status: AC
  Administered 2015-09-23: 30 mg via INTRAVENOUS
  Filled 2015-09-23: qty 1

## 2015-09-23 MED ORDER — DEXTROSE-NACL 5-0.45 % IV SOLN
INTRAVENOUS | Status: DC
  Administered 2015-09-23: 12:00:00 via INTRAVENOUS

## 2015-09-23 MED ORDER — PROPOFOL 10 MG/ML IV BOLUS
INTRAVENOUS | Status: DC | PRN
  Administered 2015-09-23: 150 mg via INTRAVENOUS

## 2015-09-23 MED ORDER — ACETIC ACID 5 % SOLN
Status: DC | PRN
  Administered 2015-09-23: 1 via TOPICAL

## 2015-09-23 MED ORDER — HYDROMORPHONE HCL 1 MG/ML IJ SOLN
0.2500 mg | INTRAMUSCULAR | Status: DC | PRN
  Administered 2015-09-23 (×2): 0.5 mg via INTRAVENOUS

## 2015-09-23 MED ORDER — PANTOPRAZOLE SODIUM 40 MG IV SOLR
40.0000 mg | Freq: Every day | INTRAVENOUS | Status: DC
  Filled 2015-09-23 (×2): qty 40

## 2015-09-23 MED ORDER — LACTATED RINGERS IV SOLN
INTRAVENOUS | Status: DC
  Administered 2015-09-23 (×3): via INTRAVENOUS

## 2015-09-23 MED ORDER — HYDROMORPHONE HCL 1 MG/ML IJ SOLN
INTRAMUSCULAR | Status: AC
  Administered 2015-09-23: 1 mg
  Filled 2015-09-23: qty 1

## 2015-09-23 MED ORDER — LIDOCAINE HCL 1 % IJ SOLN
INTRAMUSCULAR | Status: DC | PRN
  Administered 2015-09-23: 5 mL

## 2015-09-23 MED ORDER — ALUM & MAG HYDROXIDE-SIMETH 200-200-20 MG/5ML PO SUSP
30.0000 mL | ORAL | Status: DC | PRN
  Filled 2015-09-23: qty 30

## 2015-09-23 MED ORDER — MENTHOL 3 MG MT LOZG
1.0000 | LOZENGE | OROMUCOSAL | Status: DC | PRN
  Filled 2015-09-23: qty 9

## 2015-09-23 MED ORDER — SCOPOLAMINE 1 MG/3DAYS TD PT72
1.0000 | MEDICATED_PATCH | Freq: Once | TRANSDERMAL | Status: DC
  Administered 2015-09-23: 1.5 mg via TRANSDERMAL

## 2015-09-23 MED ORDER — MEPERIDINE HCL 25 MG/ML IJ SOLN
INTRAMUSCULAR | Status: AC
  Filled 2015-09-23: qty 1

## 2015-09-23 MED ORDER — GLYCOPYRROLATE 0.2 MG/ML IJ SOLN
INTRAMUSCULAR | Status: DC | PRN
  Administered 2015-09-23: 0.1 mg via INTRAVENOUS
  Administered 2015-09-23: .8 mg via INTRAVENOUS

## 2015-09-23 MED ORDER — PROPOFOL 500 MG/50ML IV EMUL
INTRAVENOUS | Status: DC | PRN
  Administered 2015-09-23: 15 mL via INTRAVENOUS

## 2015-09-23 MED ORDER — HYDROMORPHONE HCL 1 MG/ML IJ SOLN
INTRAMUSCULAR | Status: DC | PRN
  Administered 2015-09-23: 1 mg via INTRAVENOUS

## 2015-09-23 MED ORDER — ACETAMINOPHEN 325 MG PO TABS: 650.0000 mg | ORAL_TABLET | ORAL | Status: DC | PRN

## 2015-09-23 SURGICAL SUPPLY — 78 items
BAG URINE DRAINAGE (UROLOGICAL SUPPLIES) ×3 IMPLANT
BARRIER ADHS 3X4 INTERCEED (GAUZE/BANDAGES/DRESSINGS) ×3 IMPLANT
BENZOIN TINCTURE PRP APPL 2/3 (GAUZE/BANDAGES/DRESSINGS) ×3 IMPLANT
BLADE SURG 11 STRL SS (BLADE) IMPLANT
BLADE SURG 15 STRL LF C SS BP (BLADE) ×2 IMPLANT
BLADE SURG 15 STRL SS (BLADE) ×1
CATH FOLEY 2WAY SLVR  5CC 16FR (CATHETERS) ×1
CATH FOLEY 2WAY SLVR 5CC 16FR (CATHETERS) ×2 IMPLANT
CATH FOLEY LF 3WAY 5CC16FR (CATHETERS) ×3 IMPLANT
CLOTH BEACON ORANGE TIMEOUT ST (SAFETY) ×3 IMPLANT
CONT PATH 16OZ SNAP LID 3702 (MISCELLANEOUS) ×3 IMPLANT
CONTAINER PREFILL 10% NBF 15ML (MISCELLANEOUS) IMPLANT
COUNTER NEEDLE 1200 MAGNETIC (NEEDLE) IMPLANT
COVER BACK TABLE 60X90IN (DRAPES) ×6 IMPLANT
COVER TIP SHEARS 8 DVNC (MISCELLANEOUS) ×2 IMPLANT
COVER TIP SHEARS 8MM DA VINCI (MISCELLANEOUS) ×1
DECANTER SPIKE VIAL GLASS SM (MISCELLANEOUS) ×15 IMPLANT
DEFOGGER SCOPE WARMER CLEARIFY (MISCELLANEOUS) ×6 IMPLANT
DEPRESSOR TONGUE BLADE STERILE (MISCELLANEOUS) ×6 IMPLANT
DRAPE WARM FLUID 44X44 (DRAPE) ×3 IMPLANT
DRSG OPSITE POSTOP 3X4 (GAUZE/BANDAGES/DRESSINGS) ×3 IMPLANT
DURAPREP 26ML APPLICATOR (WOUND CARE) ×3 IMPLANT
ELECT NEEDLE TIP 2.8 STRL (NEEDLE) IMPLANT
ELECT REM PT RETURN 9FT ADLT (ELECTROSURGICAL) ×3
ELECTRODE REM PT RTRN 9FT ADLT (ELECTROSURGICAL) ×2 IMPLANT
GAUZE VASELINE 3X9 (GAUZE/BANDAGES/DRESSINGS) IMPLANT
GLOVE BIOGEL PI IND STRL 7.0 (GLOVE) ×12 IMPLANT
GLOVE BIOGEL PI INDICATOR 7.0 (GLOVE) ×6
GLOVE ECLIPSE 6.5 STRL STRAW (GLOVE) ×9 IMPLANT
GOWN STRL REUS W/ TWL XL LVL3 (GOWN DISPOSABLE) ×6 IMPLANT
GOWN STRL REUS W/TWL LRG LVL3 (GOWN DISPOSABLE) ×6 IMPLANT
GOWN STRL REUS W/TWL XL LVL3 (GOWN DISPOSABLE) ×3
KIT ACCESSORY DA VINCI DISP (KITS) ×1
KIT ACCESSORY DVNC DISP (KITS) ×2 IMPLANT
LEGGING LITHOTOMY PAIR STRL (DRAPES) ×3 IMPLANT
LIQUID BAND (GAUZE/BANDAGES/DRESSINGS) ×3 IMPLANT
NEEDLE HYPO 22GX1.5 SAFETY (NEEDLE) ×3 IMPLANT
NEEDLE INSUFFLATION 120MM (ENDOMECHANICALS) ×3 IMPLANT
NS IRRIG 1000ML POUR BTL (IV SOLUTION) ×3 IMPLANT
OCCLUDER COLPOPNEUMO (BALLOONS) ×3 IMPLANT
PACK ROBOT WH (CUSTOM PROCEDURE TRAY) ×3 IMPLANT
PACK ROBOTIC GOWN (GOWN DISPOSABLE) ×3 IMPLANT
PACK VAGINAL MINOR WOMEN LF (CUSTOM PROCEDURE TRAY) ×3 IMPLANT
PAD OB MATERNITY 4.3X12.25 (PERSONAL CARE ITEMS) ×3 IMPLANT
PAD POSITIONING PINK XL (MISCELLANEOUS) ×3 IMPLANT
PAD PREP 24X48 CUFFED NSTRL (MISCELLANEOUS) ×6 IMPLANT
PENCIL BUTTON HOLSTER BLD 10FT (ELECTRODE) IMPLANT
SET BI-LUMEN FLTR TB AIRSEAL (TUBING) ×3 IMPLANT
SET CYSTO W/LG BORE CLAMP LF (SET/KITS/TRAYS/PACK) ×3 IMPLANT
SET IRRIG TUBING LAPAROSCOPIC (IRRIGATION / IRRIGATOR) ×3 IMPLANT
SET TRI-LUMEN FLTR TB AIRSEAL (TUBING) ×3 IMPLANT
STRIP CLOSURE SKIN 1/4X4 (GAUZE/BANDAGES/DRESSINGS) ×3 IMPLANT
SUT SILK 2 0 SH (SUTURE) IMPLANT
SUT VIC AB 0 CT1 27 (SUTURE) ×5
SUT VIC AB 0 CT1 27XBRD ANBCTR (SUTURE) ×10 IMPLANT
SUT VIC AB 3-0 SH 27 (SUTURE) ×1
SUT VIC AB 3-0 SH 27X BRD (SUTURE) ×2 IMPLANT
SUT VICRYL 0 UR6 27IN ABS (SUTURE) ×3 IMPLANT
SUT VICRYL RAPIDE 4/0 PS 2 (SUTURE) ×6 IMPLANT
SUT VLOC 180 0 9IN  GS21 (SUTURE) ×1
SUT VLOC 180 0 9IN GS21 (SUTURE) ×2 IMPLANT
SYR 50ML LL SCALE MARK (SYRINGE) ×3 IMPLANT
SYR CONTROL 10ML LL (SYRINGE) IMPLANT
SYSTEM CONVERTIBLE TROCAR (TROCAR) ×3 IMPLANT
TIP RUMI ORANGE 6.7MMX12CM (TIP) IMPLANT
TIP UTERINE 5.1X6CM LAV DISP (MISCELLANEOUS) ×6 IMPLANT
TIP UTERINE 6.7X10CM GRN DISP (MISCELLANEOUS) IMPLANT
TIP UTERINE 6.7X6CM WHT DISP (MISCELLANEOUS) ×3 IMPLANT
TIP UTERINE 6.7X8CM BLUE DISP (MISCELLANEOUS) ×3 IMPLANT
TOWEL OR 17X24 6PK STRL BLUE (TOWEL DISPOSABLE) ×6 IMPLANT
TROCAR DILATING TIP 12MM 150MM (ENDOMECHANICALS) ×3 IMPLANT
TROCAR DISP BLADELESS 8 DVNC (TROCAR) ×2 IMPLANT
TROCAR DISP BLADELESS 8MM (TROCAR) ×1
TROCAR XCEL NON-BLD 5MMX100MML (ENDOMECHANICALS) ×3 IMPLANT
TUBING NON-CON 1/4 X 20 CONN (TUBING) IMPLANT
WARMER LAPAROSCOPE (MISCELLANEOUS) ×3 IMPLANT
WATER STERILE IRR 1000ML POUR (IV SOLUTION) ×9 IMPLANT
YANKAUER SUCT BULB TIP NO VENT (SUCTIONS) IMPLANT

## 2015-09-23 NOTE — Transfer of Care (Signed)
Immediate Anesthesia Transfer of Care Note  Patient: Kayla Hopkins  Procedure(s) Performed: Procedure(s): ROBOTIC ASSISTED TOTAL HYSTERECTOMY BILATERALSALPINGECTOMY AND OOPHORECTOMY (Bilateral) WIDE EXCISION VULVECTOMY  WLE of vulvar VIN 2 (N/A)  Patient Location: PACU  Anesthesia Type:General  Level of Consciousness: awake, alert  and oriented  Airway & Oxygen Therapy: Patient Spontanous Breathing and Patient connected to nasal cannula oxygen  Post-op Assessment: Report given to RN and Post -op Vital signs reviewed and stable  Post vital signs: Reviewed and stable  Last Vitals:  Filed Vitals:   09/23/15 0615  BP: 117/83  Pulse:   Temp:   Resp:     Complications: No apparent anesthesia complications

## 2015-09-23 NOTE — Anesthesia Postprocedure Evaluation (Signed)
  Anesthesia Post-op Note  Patient: Kayla Hopkins  Procedure(s) Performed: Procedure(s) (LRB): ROBOTIC ASSISTED TOTAL HYSTERECTOMY BILATERALSALPINGECTOMY AND OOPHORECTOMY (Bilateral) WIDE EXCISION VULVECTOMY  WLE of vulvar VIN 2 (N/A)  Patient Location: PACU  Anesthesia Type: General  Level of Consciousness: awake and alert   Airway and Oxygen Therapy: Patient Spontanous Breathing  Post-op Pain: mild  Post-op Assessment: Post-op Vital signs reviewed, Patient's Cardiovascular Status Stable, Respiratory Function Stable, Patent Airway and No signs of Nausea or vomiting  Last Vitals:  Filed Vitals:   09/23/15 1100  BP: 151/81  Pulse: 78  Temp:   Resp: 16    Post-op Vital Signs: stable   Complications: No apparent anesthesia complications

## 2015-09-23 NOTE — H&P (Signed)
Kayla Hopkins is an 54 y.o. female G3P3 MWF here for hysterectomy with bilateral salpingectomy with BSO, cystoscopy due to enlarging uterine fibroid.  She also has low back pain and is aware she may not have resolution of this pain.  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    Pt has been cleared by Dr. Fletcher Anon for surgery.    Uterus measured 6 x  4 x 3.4cm with 5cm left fundal fibroid that has increased from 3.5 since 11/14.  Pt feels this corresponds to her worsening low back pain.  Myomyectomy vs monitoring vs surgery has all been discussed.  Risks and benefits have also been reviewed.  All questions answered.  Pt here and ready to proceed.  Pertinent Gynecological History: Menses: post-menopausal Bleeding: post menopausal bleeding Contraception: PMP DES exposure: denies Blood transfusions: none Sexually transmitted diseases: no past history Previous GYN Procedures: none  Last mammogram: normal Date: 03/26/15 Last pap: normal Date: 07/16/15 OB History: G3, P3   Menstrual History: Patient's last menstrual period was 11/09/2008.    Past Medical History  Diagnosis Date  . PAF (paroxysmal atrial fibrillation) ([place])   . Hypertension   . Peripheral vascular disease ([place]) 06/11/2009    Bilateral common iliac kissing stents (8X24 mm) . 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 She underwent successful balloon angioplasty.  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   . Hyperlipidemia   . Tobacco abuse   . Claudication ([place])     clots in legs  . S/P cardiac cath 2009    Normal  . Arthritis   . Depression   . Headache   . Anxiety   . Migraine     tried several medications-no relief  . Herpes     no outbreaks-has Valtrex to take PRN  . Shortness of breath dyspnea     patient states she has had lung test that came  back normal  . PONV (postoperative nausea and vomiting)     Past Surgical History  Procedure Laterality Date  . Cardiac catheterization  2009    with stents  . Lumbar disc surgery      spinal stenosis  . Knee arthroscopy Bilateral   . Abdominal angiogram N/A 05/11/2012    Procedure: ABDOMINAL ANGIOGRAM;  Surgeon: [name], MD;  Location: [facility] CATH LAB;  Service: Cardiovascular;  Laterality: N/A;  . Abdominal aortagram N/A 10/19/2012    Procedure: ABDOMINAL Maxcine Ham;  Surgeon: [name], MD;  Location: [facility] CATH LAB;  Service: Cardiovascular;  Laterality: N/A;  . Abdominal aortagram N/A 05/10/2013    Procedure: ABDOMINAL Maxcine Ham;  Surgeon: [name], MD;  Location: [facility] CATH LAB;  Service: Cardiovascular;  Laterality: N/A;    Family History  Problem Relation Age of Onset  . Hypertension Mother   . Heart disease Father   . Heart attack Father   . Hypertension Brother   . Ovarian cancer Mother   . Lung cancer Mother   . Colon polyps Sister   . Cancer Maternal Grandfather     lung  . Cancer Paternal Grandfather     throat     Social History:  reports that she has quit smoking. Her smoking use included Cigarettes. She has a 10 pack-year smoking history. She has never used smokeless tobacco. She reports that she does  not drink alcohol or use illicit drugs.  Allergies:  Allergies  Allergen Reactions  . Pradaxa [Dabigatran Etexilate Mesylate]     Gastritis   . Zocor [Simvastatin]     Leg pains     Prescriptions prior to admission  Medication Sig Dispense Refill Last Dose  . aspirin EC 81 MG tablet Take 81 mg by mouth every morning.    Past Week at Unknown time  . atorvastatin (LIPITOR) 40 MG tablet Take 40 mg by mouth daily.   09/22/2015 at Unknown time  . fish oil-omega-3 fatty acids 1000 MG capsule Take 1 g by mouth every morning.    Past Week at Unknown time  . metoprolol succinate (TOPROL-XL) 50 MG 24 hr tablet Take 50 mg by mouth daily. Take with or  immediately following a meal.   09/22/2015 at 2200  . acetaminophen (TYLENOL) 325 MG tablet Take 2 tablets (650 mg total) by mouth every 6 (six) hours as needed for mild pain (or Fever >/= 101). (Patient not taking: Reported on 09/11/2015)   Taking  . atorvastatin (LIPITOR) 40 MG tablet TAKE 1 TABLET EVERY DAY  AT  6PM (Patient not taking: Reported on 09/11/2015) 90 tablet 1 Taking  . ibuprofen (ADVIL,MOTRIN) 800 MG tablet Take 1 tablet (800 mg total) by mouth every 8 (eight) hours as needed. (Patient not taking: Reported on 09/11/2015) 30 tablet 0     Review of Systems  All other systems reviewed and are negative.   Blood pressure 117/83, pulse 74, temperature 98.1 F (36.7 C), temperature source Oral, resp. rate 20, last menstrual period 11/09/2008, SpO2 97 %. Physical Exam  Constitutional: She is oriented to person, place, and time. She appears well-developed and well-nourished.  Cardiovascular: Normal rate and regular rhythm.   Respiratory: Effort normal and breath sounds normal.  GI: Soft. Bowel sounds are normal.  Neurological: She is alert and oriented to person, place, and time.  Skin: Skin is warm and dry.  Psychiatric: She has a normal mood and affect.    Results for orders placed or performed during the hospital encounter of 09/23/15 (from the past 24 hour(s))  Pregnancy, urine     Status: None   Collection Time: 09/23/15  6:00 AM  Result Value Ref Range   Preg Test, Ur NEGATIVE NEGATIVE    No results found.  Assessment/Plan: 54 yo G3P3 MWF here for robotic hysterectomy with bilateral salpingectomy, possible BSO, cystoscopy, and WLE of vulvar vin 2.  All questions answered.  Pt here and ready to proceed.  Hale Bogus SUZANNE 09/23/2015, 7:04 AM

## 2015-09-23 NOTE — Progress Notes (Signed)
Day of Surgery Procedure(s) (LRB): ROBOTIC ASSISTED TOTAL HYSTERECTOMY BILATERALSALPINGECTOMY AND OOPHORECTOMY (Bilateral) WIDE EXCISION VULVECTOMY  WLE of vulvar VIN 2 (N/A)  Subjective: Patient reports good pain control, no nausea.  She has voided x 2 and walked in the halls x 2.  She denies vaginal bleeding.  She has no back pain.  She would like to go home.  Surgery reviewed and surgical findings discussed.  All questions answered.  Post operative instructions reviewed.  Husband comfortable with plan.  Objective: I have reviewed patient's vital signs, intake and output, medications and labs. Tm: 98.6, Pa; 74-84, R: 16-20, BP:  119-151/68-81 UOP:  600cc 6 hr post op hb:14.2  General: alert and cooperative Resp: clear to auscultation bilaterally Cardio: regular rate and rhythm, S1, S2 normal, no murmur, click, rub or gallop GI: normal findings: soft, mild distention, normal bowel sounds, NT to palpation, incisions clean/dry/intact Extremities: extremities normal, atraumatic, no cyanosis or edema Vaginal Bleeding: none Vulva:  Incision c/d/i  Assessment: s/p Procedure(s): ROBOTIC ASSISTED TOTAL HYSTERECTOMY BILATERALSALPINGECTOMY AND OOPHORECTOMY (Bilateral) WIDE EXCISION VULVECTOMY  WLE of vulvar VIN 2 (N/A): progressing well  Plan: Discharge home     Hale Bogus [facility] 09/23/2015, 6:08 PM

## 2015-09-23 NOTE — Anesthesia Postprocedure Evaluation (Signed)
  Anesthesia Post-op Note  Patient: [name]  Procedure(s) Performed: Procedure(s): ROBOTIC ASSISTED TOTAL HYSTERECTOMY BILATERALSALPINGECTOMY AND OOPHORECTOMY (Bilateral) WIDE EXCISION VULVECTOMY  WLE of vulvar VIN 2 (N/A)  Patient Location: PACU  Anesthesia Type:General  Level of Consciousness: awake, alert  and oriented  Airway and Oxygen Therapy: Patient Spontanous Breathing  Post-op Pain: mild  Post-op Assessment: Post-op Vital signs reviewed              Post-op Vital Signs: Reviewed and stable  Last Vitals:  Filed Vitals:   09/23/15 1330  BP: 112/65  Pulse: 74  Temp: 36.4 C  Resp: 16    Complications: No apparent anesthesia complications

## 2015-09-23 NOTE — Discharge Summary (Signed)
Physician Discharge Summary  Patient ID: Kayla Hopkins MRN: [id number] DOB/AGE: [birth year] 54 y.o.  Admit date: 09/23/2015 Discharge date: 09/23/2015  Admission Diagnoses: enlarging uterine fibroid in PMP female, low back pain, VIN2 of vulva (biopsy proven), Hypertension, elevated lipids, H/o PVD, prior smoker  Discharge Diagnoses:  above  Discharged Condition: good  Hospital Course: Patient admitted through same day surgery.  She was taken to OR where Robotic TLH/BSO/cystoscopy/WLE of vulvar lesion were performed.  Surgical findings included no uterine fibroid but bilateral ovarian masses c/w fibromas.  Left ovary was about 5cm in size.  Surgery was uneventful.  EBL 100cc.  Foley catheter was replaced after cystoscopy due to low UOP during the surgery.  Patient transferred to PACU where she was stable.  UOP improved so catheter was removed.  Pt was then transferred to 3rd floor for the remainder of her hospitalization.  During her post-op recovery, her vitals and stable and she was AF.  In evening of POD#0, she was able to transition to oral pain medications and regular diet.  She was able to ambulate and she had good pain control.  She was also able to void on her own. Post op hb was 14.2, decreased from 15.3, pre-operatively.  Pt was desirous of discharge home.  At this point, she was meeting all criteria for discharge.    Consults: None  Significant Diagnostic Studies: labs: post op hb 14.2  Treatments: surgery: robotic TLH/BSO/cystoscopy, WLE of right vulvar lesions due to VIN2  Discharge Exam: Blood pressure 119/68, pulse 79, temperature 98.3 F (36.8 C), temperature source Oral, resp. rate 18, height 5\' 6"  (1.676 m), weight 193 lb (87.544 kg), last menstrual period 11/09/2008, SpO2 98 %. General appearance: alert and cooperative Resp: clear to auscultation bilaterally Cardio: regular rate and rhythm, S1, S2 normal, no murmur, click, rub or gallop GI: normal findings: soft, mild  distension, NT, Normal BS all quadrants Pelvic: vulvar incision on the right was c/d/i Extremities: extremities normal, atraumatic, no cyanosis or edema Incision/Wound:c/d/i  Disposition: Discharge to home with spouse     Medication List    TAKE these medications        acetaminophen 325 MG tablet  Commonly known as:  TYLENOL  Take 2 tablets (650 mg total) by mouth every 6 (six) hours as needed for mild pain (or Fever >/= 101).     aspirin EC 81 MG tablet  Take 81 mg by mouth every morning.     atorvastatin 40 MG tablet  Commonly known as:  LIPITOR  Take 40 mg by mouth daily.     fish oil-omega-3 fatty acids 1000 MG capsule  Take 1 g by mouth every morning.     ibuprofen 800 MG tablet  Commonly known as:  ADVIL,MOTRIN  Take 1 tablet (800 mg total) by mouth every 8 (eight) hours as needed.     metoprolol succinate 50 MG 24 hr tablet  Commonly known as:  TOPROL-XL  Take 50 mg by mouth daily. Take with or immediately following a meal.           Follow-up Information    Follow up with Lyman Speller, MD On 09/30/2015.   Specialty:  Gynecology   Why:  12:45pm   Contact information:   [place] [place] [address] [phone]       Signed: Lyman Speller 09/23/2015, 6:19 PM

## 2015-09-23 NOTE — Anesthesia Preprocedure Evaluation (Signed)
Anesthesia Evaluation  Patient identified by MRN, date of birth, ID band Patient awake    Reviewed: Allergy & Precautions, H&P , NPO status , Patient's Chart, lab work & pertinent test results  History of Anesthesia Complications (+) PONV and history of anesthetic complications  Airway Mallampati: II  TM Distance: >3 FB Neck ROM: full    Dental no notable dental hx.    Pulmonary Current Smoker, former smoker,    Pulmonary exam normal breath sounds clear to auscultation       Cardiovascular hypertension, Pt. on medications + Peripheral Vascular Disease (Bilateral inguinal stents, patent per recent duplex, on aspirin and recently taken off plavix)  Normal cardiovascular exam Rhythm:regular Rate:Normal     Neuro/Psych  Headaches, PSYCHIATRIC DISORDERS Anxiety Depression    GI/Hepatic negative GI ROS, Neg liver ROS,   Endo/Other  negative endocrine ROS  Renal/GU negative Renal ROS     Musculoskeletal   Abdominal   Peds  Hematology negative hematology ROS (+)   Anesthesia Other Findings Cardiac Clearance 08/2015  Reproductive/Obstetrics negative OB ROS                             Anesthesia Physical Anesthesia Plan  ASA: III  Anesthesia Plan: General   Post-op Pain Management:    Induction: Intravenous  Airway Management Planned: Oral ETT  Additional Equipment: None  Intra-op Plan:   Post-operative Plan: Extubation in OR  Informed Consent: I have reviewed the patients History and Physical, chart, labs and discussed the procedure including the risks, benefits and alternatives for the proposed anesthesia with the patient or authorized representative who has indicated his/her understanding and acceptance.   Dental Advisory Given  Plan Discussed with: Anesthesiologist, CRNA and Surgeon  Anesthesia Plan Comments: (GA with ETT Scop patch, zofran and decadron for PONV history,  consider background prop infusion)        Anesthesia Quick Evaluation

## 2015-09-23 NOTE — Discharge Instructions (Addendum)
Post Op Hysterectomy Instructions Please read the instructions below. Refer to these instructions for the next few weeks. These instructions provide you with general information on caring for yourself after surgery. Your caregiver may also give you specific instructions. While your treatment has been planned according to the most current medical practices available, unavoidable problems sometimes happen. If you have any problems or questions after you leave, please call your caregiver.  HOME CARE INSTRUCTIONS Healing will take time. You will have discomfort, tenderness, swelling and bruising at the operative site for a couple of weeks. This is normal and will get better as time goes on.   Only take over-the-counter or prescription medicines for pain, discomfort or fever as directed by your caregiver.   Do not take aspirin. It can cause bleeding.   Do not drive when taking pain medication.   Follow your caregivers advice regarding diet, exercise, lifting, driving and general activities.   Resume your usual diet as directed and allowed.   Get plenty of rest and sleep.   Do not douche, use tampons, or have sexual intercourse until your caregiver gives you permission. .   Take your temperature if you feel hot or flushed.   You may shower today when you get home.  No tub bath for one week.    Do not drink alcohol until you are not taking any narcotic pain medications.   Try to have someone home with you for a week or two to help with the household activities.   Be careful over the next two to three weeks with any activities at home that involve lifting, pushing, or pulling.  Listen to your body--if something feels uncomfortable to do, then don't do it.  Make sure you and your family understands everything about your operation and recovery.   Walking up stairs is fine.  Do not sign any legal documents until you feel normal again.   Keep all your follow-up appointments as recommended by  your caregiver.  TAKE OFF THE PATCH BEHIND YOUR EAR AND THE DRESSING ON YOUR BELLY BUTTON ON Thursday.   PLEASE CALL THE OFFICE IF:  There is swelling, redness or increasing pain in the wound area.   Pus is coming from the wound.   You notice a bad smell from the wound or surgical dressing.   You have pain, redness and swelling from the intravenous site.   The wound is breaking open (the edges are not staying together).    You develop pain or bleeding when you urinate.   You develop abnormal vaginal discharge.   You have any type of abnormal reaction or develop an allergy to your medication.   You need stronger pain medication for your pain   SEEK IMMEDIATE MEDICAL CARE:  You develop a temperature of 100.5 or higher.   You develop abdominal pain.   You develop chest pain.   You develop shortness of breath.   You pass out.   You develop pain, swelling or redness of your leg.   You develop heavy vaginal bleeding with or without blood clots.   MEDICATIONS:  Restart your regular medications BUT wait one week before restarting all vitamins and mineral supplements  Use Motrin 800mg  every 8 hours for the next several days.  This will help you use less Percocet.  Use the Percocet 5/325 1-2 tabs every 4-6 hours as needed for pain.  You may use an over the counter stool softener like Colace or Dulcolax to help with starting a  bowel movement.  Start the day after you go home.  Warm liquids, fluids, and ambulation help too.  If you have not had a bowel movement in four days, you need to call the office.  RESTART YOUR ASPIRIN IN THREE DAYS.

## 2015-09-23 NOTE — Op Note (Signed)
09/23/2015  11:34 AM  PATIENT:  Kayla Hopkins  54 y.o. 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, VIN 2  PROCEDURE:  Procedure(s): ROBOTIC ASSISTED TOTAL HYSTERECTOMY BILATERALSALPINGECTOMY AND OOPHORECTOMY, CYSTOSCOPY WIDE EXCISION VULVECTOMY  WLE of vulvar VIN 2  SURGEON:  Irlanda Croghan SUZANNE  ASSISTANTS: JERTSON, JILL   ANESTHESIA:   general  ESTIMATED BLOOD LOSS: 100cc  BLOOD ADMINISTERED:none   FLUIDS: 1100ccLR  UOP: 50cc concentrated urine  SPECIMEN:  Uterus, cervix, bilateral tubes and ovaries  DISPOSITION OF SPECIMEN:  PATHOLOGY  FINDINGS: upper abdominal adhesions of bowel and omentum--all out of the way of the surgical field, enlarge and heavy bilateral ovarian masses consistent with fibromas, normal uterus.  DESCRIPTION OF OPERATION: DESCRIPTION OF OPERATION: Patient is taken to the operating room. She is placed in the supine position. She is a running IV in place. Informed consent was present on the chart. SCDs on her lower extremities and functioning properly. General endotracheal anesthesia was administered by the anesthesia staff without difficulty. Dr.Judd oversaw case. Once adequate anesthesia was confirmed the legs are placed in the low lithotomy position in [place].   Chlor prep was then used to prep the abdomen and Betadine was used to prep the inner thighs, perineum and vagina. Once 3 minutes had past the patient was draped in a normal standard fashion. The legs were lifted to the high lithotomy position. The cervix was visualized by placing a heavy weighted speculum in the posterior aspect of the vagina and using a curved Deaver retractor to the retract anteriorly. The anterior lip of the cervix was grasped with single-tooth tenaculum.  The cervix sounded to 7cm. Pratt dilators were used to dilate the cervix up to a #21. A RUMI uterine manipulator was obtained. A  #8 disposable tip was placed on the RUMI manipulator as well as a small KOH ring. This was passed through the cervix but the tip was too long so was changed to a #6.  This was then passed through the cervix with good fit of the KOH ring.  The tip was inflated with 3cc of normal saline.  The tenaculum was removed. There is also good manipulation of the uterus. The speculum and retractor were removed as well. A Foley catheter was placed to straight drain. The concentrated urine was noted. Legs were lowered to the low lithotomy position and attention was turned the abdomen.  Ropivacaine mixture (0.5% mixed one-to-one with normal saline) was used anesthetize the skin above the umbilicus.  A Veress needle was obtained.   The abdomen was elevated and the needle was passed directly into the abdomen through the umbilicus. The peritoneum was felt as a pop as it was passed with the needle. A syringe of normal saline was attached the needle and aspiration was performed. No blood or fluid was noted. Fluid was injected without difficulty and a second aspiration was performed. No fluid or blood or saline was noted. Fluid dripped easily into the needle. Low flow CO2 gas was attached the needle and the pneumoperitoneum was achieved without difficulty. Once for 4L of gas was in the abdomen the Veress needle was removed and a 12 millimeter port bladed trocar were passed directly to the abdomen. The laparoscope was then used to confirm intraperitoneal placement. There were some omental and bowel adhesions superiorly in the abdomen but this was out of the way for the surgery.  Liver and gall bladder appear normal.  Locations  for the 1 and #2 arm ports could also be visualized. The skin was transilluminated and the skin was anesthetized with ropivacaine mixture. 75mm skin incisions were made about 10 cm lateral to the umbilicus on each side. Then 69mm nondisposable trocar ports were passed directly into the abdomen. Also on the right  lower quadrant a 71mm skin incision was made after anesthetize the skin with the ropivacaine mixture. A 5 mm non-bladed trocar and port were also passed directly into the abdomen. All trochars were removed.  The table was placed on the floor and the patient was placed in  Trendelenburg.  The robot was docked in a normal standard fashion to the left of the table. In the #1 arm was placed endoscopic scissors with monopolar cautery attached and then the #2 arm was placed [place] with bipolar cautery attached.   Ovaries were noted to be enlarged and heavy.  There was not fundal fibroid.  On ultrasound, the probable fibromas must have looked like fibroid tissue.  Ureters were seen bilaterally.  Attention was turned to the right side. With uterus on stretch the right IP ligament was serially clamped, cauterized, and incised, being careful of the location of the ureter.  The right round ligament was serially clamped cauterized and incised. The anterior and posterior peritoneum of the inferior leaf of the broad ligament were opened.  The bladder flap was created without difficulty and the bladder was taken down below the level of the KOH ring. The right uterine artery skeletonized and then just superior to the KOH ring this vessel was serially clamped, cauterized, and incised.  Attention was turned the left side.  There were some adhesions to the colon preventing adequate visualization of the left IP ligament so these were taken down without difficulty.  Then the left IP ligament was serially clamped, cauterized and incised.  Next the left round ligament was serially clamped cauterized and incised. The anterior posterior peritoneum of the inferiorly for the broad ligament were opened. The anterior peritoneum was carried across to the dissection on the right side. The remainder of the bladder flap was created using sharp dissection. The bladder was well below the level of the KOH ring. The left uterine artery  skeletonized. Then the left uterine artery, above the level of the KOH ring, was serially clamped cauterized and incised. The uterus was devascularized at this point.  The colpotomy was performed a starting in the midline and using monopolar cautery with an open edge of the scissors. This was carried around a circumferential fashion until the vaginal mucosa was completely incised in the specimen was freed.  The specimen was then delivered to the vagina.  A vaginal occlusive device was used to maintain the pneumoperitoneum  Instruments were changed with a needle cut suture driver placed in arm 1 and a Cobra grasper placed #2. A V. lock suture was passed through the middle port. Starting in the right angle, the cuff was closed incorporating the anterior and posterior vaginal mucosa in each stitch. This was carried across all the way to the left corner and a running fashion. Two stitches were brought back towards the midline and the suture was cut flush with the vagina. The needle was brought out the pelvis. The pelvis was irrigated. All pedicles were inspected. No bleeding was noted. Ureters were noted deep in the pelvis to be peristalsing.  At this point the procedure was completed. The instruments were removed. The robot was undocked. The patient was taken out  of Trendelenburg positioning. The ports were removed under direct vision shove laparoscope and the pneumoperitoneum was relieved. Several deep breaths were given to the patient's trying to any gas the abdomen and finally the midline port was removed.  The midline port was closed at the fascial level with figure-of-eight suture of #0 Vicryl. The skin was then closed with subcuticular stitches of 3-0 Vicryl. The skin was cleansed and skin glue was applied. Attention was then turned the vagina and the cuff was inspected. No bleeding was noted. The anterior posterior vaginal assistant incorporated in each stitch. The Foley catheter was removed.  Cystoscopy  was performed.  The entire bladder was visualized.  No injury or suture was noted.  Urine was seen from each ureteral orifice.  Fluid was drained and cystoscopy was ended.  Foley was placed back to SD.  Sponge, lap, needle, initially counts were correct x2. Patient tolerated the procedure very well. She was awakened from anesthesia, extubated and taken to recovery in stable condition.   COUNTS:  YES  PLAN OF CARE: Transfer to PACU

## 2015-09-23 NOTE — Anesthesia Procedure Notes (Signed)
Procedure Name: Intubation Date/Time: 09/23/2015 7:27 AM Performed by: Vernice Jefferson Pre-anesthesia Checklist: Patient identified, Emergency Drugs available, Suction available, Patient being monitored and Timeout performed Patient Re-evaluated:Patient Re-evaluated prior to inductionOxygen Delivery Method: Circle system utilized Preoxygenation: Pre-oxygenation with 100% oxygen Intubation Type: IV induction Ventilation: Mask ventilation without difficulty Laryngoscope Size: Mac and 3 Grade View: Grade II Tube type: Oral Tube size: 7.0 mm Number of attempts: 1 Airway Equipment and Method: Stylet Placement Confirmation: ETT inserted through vocal cords under direct vision,  positive ETCO2 and breath sounds checked- equal and bilateral Secured at: 20 cm Tube secured with: Tape Dental Injury: Teeth and Oropharynx as per pre-operative assessment

## 2015-09-23 NOTE — Progress Notes (Addendum)
Pt discharged to home with husband.  Condition stable.  Pt ambulated to car with RN.  No equipment for home ordered at discharge. 

## 2015-09-24 ENCOUNTER — Encounter (HOSPITAL_COMMUNITY): Payer: Self-pay | Admitting: Obstetrics & Gynecology

## 2015-09-26 ENCOUNTER — Telehealth: Payer: Self-pay | Admitting: Obstetrics & Gynecology

## 2015-09-26 NOTE — Telephone Encounter (Signed)
No.  That is good.  Thanks.  Ok to close encounter.

## 2015-09-26 NOTE — Telephone Encounter (Signed)
Spoke with patient. Patient denies any fevers, or chills. Patient states that she is having trouble having bowel movements. Is very uncomfortable and has a lot of gas. Is not having any urinary problems. States she is drinking plenty of fluids. Encourage fluid intake and movement to get bowels moving. States has 8 pills of Percocet left. "I was concerned with the weekend coming up that I would run out." Is taking Motrin 800 mg in the morning. Encouraged patient to alternate taking Motrin and Percocet during the pain. Advised will need to be seen in office with Dr.Miller tomorrow. Patient is agreeable. Husband does not get off work until 2:30 pm. Appointment scheduled for tomorrow 11/17 at 3 pm with Dr.Miller. Agreeable to date and time.  Dr.Miller,anything further for patient?

## 2015-09-26 NOTE — Telephone Encounter (Signed)
Please check about fever, bowel movements, urinary function.  She needs appointment to be seen.  If she is doing otherwise, tomorrow is ok for appt.  Is she taking her Motrin as well?

## 2015-09-26 NOTE — Telephone Encounter (Signed)
Patient just had surgery and is needing refill of Oxycodone sent to [place]. She said that her husband could also stop by and pick it up. Best # to reach: [phone]

## 2015-09-26 NOTE — Telephone Encounter (Signed)
Spoke with patient. Patient had robotic total hysterectomy on 09/23/2015. Patient states that she has been taking Percocet two tablets every 4 hours. Patient is requesting a refill. States that she is almost of of the medication. Percocet does help to relieve her pain. "Dr.Miller told me to call if I needed a refill." Advised I will speak with Dr.Miller regarding refill and return call with further recommendations. Patient is agreeable.

## 2015-09-27 ENCOUNTER — Ambulatory Visit (INDEPENDENT_AMBULATORY_CARE_PROVIDER_SITE_OTHER): Payer: Commercial Managed Care - HMO | Admitting: Obstetrics & Gynecology

## 2015-09-27 VITALS — BP 158/98 | HR 68 | Temp 97.5°F | Resp 16 | Wt 194.0 lb

## 2015-09-27 DIAGNOSIS — K5900 Constipation, unspecified: Secondary | ICD-10-CM

## 2015-09-27 DIAGNOSIS — Z9889 Other specified postprocedural states: Secondary | ICD-10-CM

## 2015-09-27 DIAGNOSIS — D279 Benign neoplasm of unspecified ovary: Secondary | ICD-10-CM

## 2015-09-27 MED ORDER — OXYCODONE-ACETAMINOPHEN 5-325 MG PO TABS: 1.0000 | ORAL_TABLET | Freq: Four times a day (QID) | ORAL | Status: DC | PRN

## 2015-09-27 NOTE — Patient Instructions (Signed)
It is ok to use a Fleets enema Start magnesium citrate 2tbs every 4 hours until you have a bowel movement. Then start colace 100mg  twice daily (generic is ok)

## 2015-09-27 NOTE — Progress Notes (Signed)
Post Operative Visit  Procedure: Robotic Assisted Hysterectomy, Bilateral Salpingectomy and Oophorectomy, cystoscopy Days Post-op: 5 days  Subjective: Pt called yesterday requesting another refill for Percocet.  Advised she needed to be seen.  Today, pt reports pain has gotten worse over the last two days, specifically as she's had more bloating.  Has not had a bowel movement yet.  Voiding fine.  Drinking a good amount of liquids.  Did have a BM two days before surgery so she's gone about a week without a BM.  Hasn't started any stool softeners or prune juice.  Not taking her motrin as recommended.  Taking 1-2 per day.  Passing flatus although she feels like she would be more comfortable if she could just really pass a lot of gas.  No fevers.  No vaginal bleeding.  Pathology reviewed with pt.  Margins after VIN 2 excision were +.  [place] tumors on each ovary.  Otherwise, pathology was negative.    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  Fleets enema recommended and magnesium citrate recommended 2TBS every four hours until has relief.  Also, after has a BM advised to start colace 100mg  bid until off Percocet.  Walking and warm liquids encouraged as well.  RF was given for Percocet #30/0RF.  Pt also advised to use the Motrin 800mg  TID and  work seriously to decrease her narcotic use.    Will keep F/U appt for Monday as scheduled. Plan suture removal then.  Additional recommendations may be needed.[name]

## 2015-09-28 ENCOUNTER — Encounter: Payer: Self-pay | Admitting: Obstetrics & Gynecology

## 2015-09-28 DIAGNOSIS — D279 Benign neoplasm of unspecified ovary: Secondary | ICD-10-CM | POA: Insufficient documentation

## 2015-09-30 ENCOUNTER — Ambulatory Visit (INDEPENDENT_AMBULATORY_CARE_PROVIDER_SITE_OTHER): Payer: Commercial Managed Care - HMO | Admitting: Obstetrics & Gynecology

## 2015-09-30 VITALS — BP 168/98 | HR 64 | Resp 16 | Wt 191.0 lb

## 2015-09-30 DIAGNOSIS — Z9889 Other specified postprocedural states: Secondary | ICD-10-CM

## 2015-09-30 NOTE — Progress Notes (Signed)
Post Operative Visit  Procedure Robotic Assisted Hysterectomy, Bilateral Salpingectomy and Oophorectomy, cystoscopy Days Post-op: 1 week  Subjective: Doing much better.  Pain has gotten a lot better as she was able to relieve a lot of gas.  Has taken only three Percocet since I saw her on Friday.  Had a small and hard BM.  Feels like more is "right there".  Couldn't do the enema.  Just couldn't get it in the right place.  Recommended dulcolax suppository or glycerine suppository.  Pt will then start daily Miralax to help with stool softening until back to regular BMs.  No fevers.  Emptying her bladder well.    Objective: BP 168/98 mmHg  Pulse 64  Resp 16  Wt 191 lb (86.637 kg)  LMP 11/09/2008  EXAM General: alert and cooperative Resp: clear to auscultation bilaterally Cardio: regular rate and rhythm, S1, S2 normal, no murmur, click, rub or gallop GI: soft, non-tender; bowel sounds normal; no masses,  no organomegaly and incision: clean, dry and intact, significantly decreased distention Extremities: extremities normal, atraumatic, no cyanosis or edema Vaginal Bleeding: none  GYN: sutures removed from WLE, healing well without drainage or erythema  Assessment: s/p Robotic TLH/BSO/cystoscopy due to [employer], s/p excision with +margins  Plan: Recheck 3 weeks Will need to do vulvar colpo 3-6 months from now, depending on healing.

## 2015-10-01 ENCOUNTER — Encounter: Payer: Self-pay | Admitting: Obstetrics & Gynecology

## 2015-10-15 ENCOUNTER — Other Ambulatory Visit: Payer: Self-pay | Admitting: Cardiovascular Disease

## 2015-10-25 ENCOUNTER — Ambulatory Visit (INDEPENDENT_AMBULATORY_CARE_PROVIDER_SITE_OTHER): Payer: Commercial Managed Care - HMO | Admitting: Obstetrics & Gynecology

## 2015-10-25 ENCOUNTER — Encounter: Payer: Self-pay | Admitting: Obstetrics & Gynecology

## 2015-10-25 VITALS — BP 120/66 | HR 72 | Resp 16 | Ht 66.0 in | Wt 189.0 lb

## 2015-10-25 DIAGNOSIS — Z720 Tobacco use: Secondary | ICD-10-CM

## 2015-10-25 DIAGNOSIS — D279 Benign neoplasm of unspecified ovary: Secondary | ICD-10-CM

## 2015-10-25 DIAGNOSIS — D250 Submucous leiomyoma of uterus: Secondary | ICD-10-CM

## 2015-10-25 DIAGNOSIS — Z9889 Other specified postprocedural states: Secondary | ICD-10-CM

## 2015-10-25 DIAGNOSIS — Z87891 Personal history of nicotine dependence: Secondary | ICD-10-CM

## 2015-10-25 DIAGNOSIS — N901 Moderate vulvar dysplasia: Secondary | ICD-10-CM

## 2015-10-25 NOTE — Progress Notes (Signed)
Post Operative Visit  Procedure: ROBOTIC ASSISTED TOTAL HYSTERECTOMY BILATERALSALPINGECTOMY AND OOPHORECTOMY CYSTOSCOPY WLE of vulvar VIN 2     Days Post-op: 4 weeks  Subjective: Doing well.  Hasn't been on pain medications since two days after the last visit.  Bowel and bladder habits are normal.  Back pain has resolved.  Pt states that she doesn't feel "the rock" in her pelvis any longer.  Reviewed pathology with pt and + margins with WLE of vulva.  Pt aware she will need additional evaluation for this in the future.  Denies VB.  Reviewed 8 weeks of pelvic rest with pt.  All questions answered.  Objective: BP 120/66 mmHg  Pulse 72  Resp 16  Ht 5\' 6"  (1.676 m)  Wt 189 lb (85.73 kg)  BMI 30.52 kg/m2  LMP 11/09/2008  EXAM General: alert and cooperative Resp: clear to auscultation bilaterally Cardio: regular rate and rhythm, S1, S2 normal, no murmur, click, rub or gallop GI: soft, non-tender; bowel sounds normal; no masses,  no organomegaly and incision: clean, dry and intact Extremities: extremities normal, atraumatic, no cyanosis or edema Vaginal Bleeding: none  Gyn:  NAEFG with well healing area of WLE, vaginal without lesions, cuff healing well and well approximated, no fullness or firmness, nontender  Assessment: s/p Robotic TLH/BSO, cystoscopy, 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  I'd like there to be really good healing of the vulvar before I look again with the colposcope.  Pt voices clear understanding.  She is released to do normal activity except intercourse (for four more weeks).

## 2015-10-25 NOTE — Progress Notes (Signed)
After appointment with Dr Sabra Heck, patient desires to proceed with scheduling follow-up vulvar colposcopy. Appointment scheduled for 01-31-16 at 1000 here in office.

## 2015-10-28 DIAGNOSIS — Z87891 Personal history of nicotine dependence: Secondary | ICD-10-CM | POA: Insufficient documentation

## 2015-11-04 ENCOUNTER — Encounter (HOSPITAL_COMMUNITY): Payer: Self-pay | Admitting: *Deleted

## 2015-11-04 ENCOUNTER — Emergency Department (HOSPITAL_COMMUNITY)
Admission: EM | Admit: 2015-11-04 | Discharge: 2015-11-04 | Disposition: A | Discharge status: Home / Self Care | Payer: Commercial Managed Care - HMO | Attending: Emergency Medicine | Admitting: Emergency Medicine

## 2015-11-04 ENCOUNTER — Emergency Department (HOSPITAL_COMMUNITY): Payer: Commercial Managed Care - HMO

## 2015-11-04 DIAGNOSIS — I480 Paroxysmal atrial fibrillation: Secondary | ICD-10-CM | POA: Diagnosis not present

## 2015-11-04 DIAGNOSIS — Z79899 Other long term (current) drug therapy: Secondary | ICD-10-CM | POA: Diagnosis not present

## 2015-11-04 DIAGNOSIS — M1990 Unspecified osteoarthritis, unspecified site: Secondary | ICD-10-CM | POA: Insufficient documentation

## 2015-11-04 DIAGNOSIS — R42 Dizziness and giddiness: Secondary | ICD-10-CM | POA: Diagnosis not present

## 2015-11-04 DIAGNOSIS — I10 Essential (primary) hypertension: Secondary | ICD-10-CM | POA: Insufficient documentation

## 2015-11-04 DIAGNOSIS — Z7982 Long term (current) use of aspirin: Secondary | ICD-10-CM | POA: Diagnosis not present

## 2015-11-04 DIAGNOSIS — Z9889 Other specified postprocedural states: Secondary | ICD-10-CM | POA: Insufficient documentation

## 2015-11-04 DIAGNOSIS — G43909 Migraine, unspecified, not intractable, without status migrainosus: Secondary | ICD-10-CM | POA: Diagnosis not present

## 2015-11-04 DIAGNOSIS — Z87891 Personal history of nicotine dependence: Secondary | ICD-10-CM | POA: Diagnosis not present

## 2015-11-04 DIAGNOSIS — F419 Anxiety disorder, unspecified: Secondary | ICD-10-CM | POA: Diagnosis not present

## 2015-11-04 DIAGNOSIS — R0602 Shortness of breath: Secondary | ICD-10-CM | POA: Diagnosis not present

## 2015-11-04 DIAGNOSIS — E785 Hyperlipidemia, unspecified: Secondary | ICD-10-CM | POA: Diagnosis not present

## 2015-11-04 LAB — BASIC METABOLIC PANEL
Anion gap: 10 (ref 5–15)
BUN: 14 mg/dL (ref 6–20)
CHLORIDE: 106 mmol/L (ref 101–111)
CO2: 25 mmol/L (ref 22–32)
CREATININE: 0.81 mg/dL (ref 0.44–1.00)
Calcium: 9.3 mg/dL (ref 8.9–10.3)
GFR calc Af Amer: >60 mL/min (ref >60)
GFR calc non Af Amer: >60 mL/min (ref >60)
GLUCOSE: 104 mg/dL — AB (ref 65–99)
Potassium: 4.3 mmol/L (ref 3.5–5.1)
Sodium: 141 mmol/L (ref 135–145)

## 2015-11-04 LAB — URINALYSIS, ROUTINE W REFLEX MICROSCOPIC
BILIRUBIN URINE: NEGATIVE
GLUCOSE, UA: NEGATIVE mg/dL
Ketones, ur: NEGATIVE mg/dL
Leukocytes, UA: NEGATIVE
NITRITE: NEGATIVE
PH: 5 (ref 5.0–8.0)
Protein, ur: NEGATIVE mg/dL
SPECIFIC GRAVITY, URINE: 1.017 (ref 1.005–1.030)

## 2015-11-04 LAB — CBC
HEMATOCRIT: 45.4 % (ref 36.0–46.0)
HEMOGLOBIN: 14.6 g/dL (ref 12.0–15.0)
MCH: 29.3 pg (ref 26.0–34.0)
MCHC: 32.2 g/dL (ref 30.0–36.0)
MCV: 91 fL (ref 78.0–100.0)
Platelets: 220 10*3/uL (ref 150–400)
RBC: 4.99 MIL/uL (ref 3.87–5.11)
RDW: 13.8 % (ref 11.5–15.5)
WBC: 7.2 10*3/uL (ref 4.0–10.5)

## 2015-11-04 LAB — I-STAT TROPONIN, ED: Troponin i, poc: 0 ng/mL (ref 0.00–0.08)

## 2015-11-04 LAB — URINE MICROSCOPIC-ADD ON

## 2015-11-04 MED ORDER — LORAZEPAM 1 MG PO TABS
0.5000 mg | ORAL_TABLET | Freq: Once | ORAL | Status: AC
  Administered 2015-11-04: 0.5 mg via ORAL
  Filled 2015-11-04: qty 1

## 2015-11-04 MED ORDER — MECLIZINE HCL 25 MG PO TABS
12.5000 mg | ORAL_TABLET | Freq: Once | ORAL | Status: AC
  Administered 2015-11-04: 12.5 mg via ORAL
  Filled 2015-11-04: qty 1

## 2015-11-04 NOTE — ED Notes (Addendum)
Pt reports feeling sob and dizziness that started today. Pt states that she feels like she "cannot get her breath" Pt states that she "felt like she is going to pass out". No neuro deficits noted.

## 2015-11-04 NOTE — Discharge Instructions (Signed)
It was our pleasure to provide your ER care today - we hope that you feel better.  Rest. Drink adequate fluids.  Follow up with primary care doctor in the next 1-2 days for recheck if symptoms fail to improve/resolve.  Return to ER if worse, new symptoms, fainting episode, chest pain, trouble breathing, fevers, other concern.  You were given medication in the ER - no driving for the next 4 hours.   Dizziness Dizziness is a common problem. It is a feeling of unsteadiness or light-headedness. You may feel like you are about to faint. Dizziness can lead to injury if you stumble or fall. Anyone can become dizzy, but dizziness is more common in older adults. This condition can be caused by a number of things, including medicines, dehydration, or illness. HOME CARE INSTRUCTIONS Taking these steps may help with your condition: Eating and Drinking  Drink enough fluid to keep your urine clear or pale yellow. This helps to keep you from becoming dehydrated. Try to drink more clear fluids, such as water.  Do not drink alcohol.  Limit your caffeine intake if directed by your health care provider.  Limit your salt intake if directed by your health care provider. Activity  Avoid making quick movements.  Rise slowly from chairs and steady yourself until you feel okay.  In the morning, first sit up on the side of the bed. When you feel okay, stand slowly while you hold onto something until you know that your balance is fine.  Move your legs often if you need to stand in one place for a long time. Tighten and relax your muscles in your legs while you are standing.  Do not drive or operate heavy machinery if you feel dizzy.  Avoid bending down if you feel dizzy. Place items in your home so that they are easy for you to reach without leaning over. Lifestyle  Do not use any tobacco products, including cigarettes, chewing tobacco, or electronic cigarettes. If you need help quitting, ask your health  care provider.  Try to reduce your stress level, such as with yoga or meditation. Talk with your health care provider if you need help. General Instructions  Watch your dizziness for any changes.  Take medicines only as directed by your health care provider. Talk with your health care provider if you think that your dizziness is caused by a medicine that you are taking.  Tell a friend or a family member that you are feeling dizzy. If he or she notices any changes in your behavior, have this person call your health care provider.  Keep all follow-up visits as directed by your health care provider. This is important. SEEK MEDICAL CARE IF:  Your dizziness does not go away.  Your dizziness or light-headedness gets worse.  You feel nauseous.  You have reduced hearing.  You have new symptoms.  You are unsteady on your feet or you feel like the room is spinning. SEEK IMMEDIATE MEDICAL CARE IF:  You vomit or have diarrhea and are unable to eat or drink anything.  You have problems talking, walking, swallowing, or using your arms, hands, or legs.  You feel generally weak.  You are not thinking clearly or you have trouble forming sentences. It may take a friend or family member to notice this.  You have chest pain, abdominal pain, shortness of breath, or sweating.  Your vision changes.  You notice any bleeding.  You have a headache.  You have neck pain or  a stiff neck.  You have a fever.   This information is not intended to replace advice given to you by your health care provider. Make sure you discuss any questions you have with your health care provider.   Document Released: 04/21/2001 Document Revised: 03/12/2015 Document Reviewed: 10/22/2014 Elsevier Interactive Patient Education [employer].

## 2015-11-04 NOTE — ED Notes (Signed)
Pt. Is in X-ray

## 2015-11-04 NOTE — ED Notes (Signed)
Pt ambulated in hallway.  Walked w steady gait and without difficulty.

## 2015-11-04 NOTE — ED Provider Notes (Signed)
CSN: [id number]     Arrival date & time 11/04/15  1221 History   First MD Initiated Contact with Patient 11/04/15 1309     Chief Complaint  Patient presents with  . Dizziness     (Consider location/radiation/quality/duration/timing/severity/associated sxs/prior Treatment) The history is provided by the patient.  Patient c/o onset dizziness today.  States felt as if both room spinning and as if lightheaded. Pt indicates then her mouth felt dry, and felt as if might pass out. Denies ear pain, tinnitus or hearing loss. Then felt sob, although no current sob, and denies recent unusual doe or fatigue.  No loc/syncope. States hx similar symptoms in past, unsure of dx. Denies associated palpitations or sense of rapid or irregular heartbeat. Pt denies any current or recent chest pain or discomfort.   Pt denies cough or uri c/o. No fever or chills. No headache. No abd pain. No nvd. No dysuria or gu c/o. No recent change in meds/new meds.       Past Medical History  Diagnosis Date  . PAF (paroxysmal atrial fibrillation) ([place])   . Hypertension   . Peripheral vascular disease ([place]) 06/11/2009    Bilateral common iliac kissing stents (8X24 mm) . 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 She underwent successful balloon angioplasty.  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   . Hyperlipidemia   . Tobacco abuse   . Claudication ([place])     clots in legs  . S/P cardiac cath 2009    Normal  . Arthritis   . Depression   . Headache   . Anxiety   . Migraine     tried several medications-no relief  . Herpes     no outbreaks-has Valtrex to take PRN  . Shortness of breath dyspnea     patient states she has had lung test that came back normal  . PONV (postoperative nausea and vomiting)    Past Surgical History  Procedure Laterality Date  .  Cardiac catheterization  2009    with stents  . Lumbar disc surgery      spinal stenosis  . Knee arthroscopy Bilateral   . Abdominal angiogram N/A 05/11/2012    Procedure: ABDOMINAL ANGIOGRAM;  Surgeon: [name], MD;  Location: [facility] CATH LAB;  Service: Cardiovascular;  Laterality: N/A;  . Abdominal aortagram N/A 10/19/2012    Procedure: ABDOMINAL Maxcine Ham;  Surgeon: [name], MD;  Location: [facility] CATH LAB;  Service: Cardiovascular;  Laterality: N/A;  . Abdominal aortagram N/A 05/10/2013    Procedure: ABDOMINAL Maxcine Ham;  Surgeon: [name], MD;  Location: [facility] CATH LAB;  Service: Cardiovascular;  Laterality: N/A;  . Robotic assisted total hysterectomy with salpingectomy Bilateral 09/23/2015    Procedure: ROBOTIC ASSISTED TOTAL HYSTERECTOMY BILATERALSALPINGECTOMY AND OOPHORECTOMY;  Surgeon: [name], MD;  Location: [facility];  Service: Gynecology;  Laterality: Bilateral;  . Vulvectomy N/A 09/23/2015    Procedure: WIDE EXCISION VULVECTOMY  WLE of vulvar VIN 2;  Surgeon: [name], MD;  Location: [facility];  Service: Gynecology;  Laterality: N/A;   Family History  Problem Relation Age of Onset  . Hypertension Mother   . Heart disease Father   . Heart attack Father   . Hypertension Brother   . Ovarian cancer Mother   . Lung cancer Mother   . Colon polyps Sister   .  Cancer Maternal Grandfather     lung  . Cancer Paternal Grandfather     throat    Social History  Substance Use Topics  . Smoking status: Former Smoker -- 0.25 packs/day for 40 years    Types: Cigarettes  . Smokeless tobacco: Never Used     Comment: electronic cigarette  . Alcohol Use: No   OB History    Gravida Para Term Preterm AB TAB SAB Ectopic Multiple Living   3 3        3      Review of Systems  Constitutional: Negative for fever and chills.  HENT: Negative for ear pain, sore throat and tinnitus.   Eyes: Negative for visual disturbance.  Respiratory: Negative for shortness of breath.    Cardiovascular: Negative for chest pain.  Gastrointestinal: Negative for vomiting and abdominal pain.  Genitourinary: Negative for dysuria and flank pain.  Musculoskeletal: Negative for back pain and neck pain.  Skin: Negative for rash.  Neurological: Negative for speech difficulty, weakness, numbness and headaches.  Hematological: Does not bruise/bleed easily.  Psychiatric/Behavioral: Negative for confusion.      Allergies  Pradaxa and Zocor  Home Medications   Prior to Admission medications   Medication Sig Start Date End Date Taking? Authorizing Provider  acetaminophen (TYLENOL) 325 MG tablet Take 2 tablets (650 mg total) by mouth every 6 (six) hours as needed for mild pain (or Fever >/= 101). 03/09/14  Yes [name], MD  aspirin EC 81 MG tablet Take 81 mg by mouth every morning.    Yes Historical Provider, MD  atorvastatin (LIPITOR) 40 MG tablet Take 40 mg by mouth daily.   Yes Historical Provider, MD  esomeprazole (NEXIUM) 20 MG capsule Take 20 mg by mouth daily at 12 noon.   Yes Historical Provider, MD  fish oil-omega-3 fatty acids 1000 MG capsule Take 1 g by mouth every morning.    Yes Historical Provider, MD  metoprolol succinate (TOPROL-XL) 50 MG 24 hr tablet TAKE 1 TABLET DAILY. TAKE WITH OR IMMEDIATELY FOLLOWING A MEAL. 10/15/15  Yes [name], MD   BP 147/75 mmHg  Pulse 74  Temp(Src) 97.5 F (36.4 C) (Oral)  Resp 18  SpO2 100%  LMP 11/09/2008 Physical Exam  Constitutional: She is oriented to person, place, and time. She appears well-developed and well-nourished. No distress.  HENT:  Nose: Nose normal.  Mouth/Throat: Oropharynx is clear and moist.  tms normal.   Eyes: Conjunctivae and EOM are normal. Pupils are equal, round, and reactive to light. No scleral icterus.  Neck: Neck supple. No tracheal deviation present. No thyromegaly present.  No bruit.  no stiffness or rigidity  Cardiovascular: Normal rate, regular rhythm, normal heart sounds and  intact distal pulses.  Exam reveals no gallop and no friction rub.   No murmur heard. Pulmonary/Chest: Effort normal and breath sounds normal. No respiratory distress.  Abdominal: Soft. Normal appearance and bowel sounds are normal. She exhibits no distension. There is no tenderness.  Genitourinary:  No cva tenderness  Musculoskeletal: She exhibits no edema.  Neurological: She is alert and oriented to person, place, and time. No cranial nerve deficit.  No facial droop. Motor intact bil, stre 5/5. No pronator drift. sens grossly intact bil. Finger to nose normal bil. Ambulates w steady gait.   Skin: Skin is warm and dry. No rash noted. She is not diaphoretic.  Psychiatric:  Anxious appearing.   Nursing note and vitals reviewed.   ED Course  Procedures (including  critical care time) Labs Review Labs Reviewed  BASIC METABOLIC PANEL - Abnormal; Notable for the following:    Glucose, Bld 104 (*)    All other components within normal limits  CBC  URINALYSIS, ROUTINE W REFLEX MICROSCOPIC (NOT AT [facility])  Randolm Idol, ED    Imaging Review Dg Chest 2 View  11/04/2015  CLINICAL DATA:  Short of breath, dizziness and numbness. EXAM: CHEST  2 VIEW COMPARISON:  None. FINDINGS: Normal mediastinum and cardiac silhouette. Normal pulmonary vasculature. No evidence of effusion, infiltrate, or pneumothorax. No acute bony abnormality. IMPRESSION: No acute cardiopulmonary process. Electronically Signed   By: Suzy Bouchard M.D.   On: 11/04/2015 13:27   I have personally reviewed and evaluated these images and lab results as part of my medical decision-making.   EKG Interpretation   Date/Time:  Monday November 04 2015 12:22:15 EST Ventricular Rate:  83 PR Interval:  130 QRS Duration: 82 QT Interval:  342 QTC Calculation: 401 R Axis:   39 Text Interpretation:  Normal sinus rhythm `no acute st/t changes Confirmed  by Ashok Cordia  MD, Lennette Bihari ([id number]) on 11/04/2015 1:45:20 PM      MDM   Iv ns.  Labs. Monitor.  Ativan .5 mg and antivert for symptom relief.   Reviewed nursing notes and prior charts for additional history.   Labs look good.  Recheck pt - no c/o. Denies any faintness or lightheadedness. No vertigo.  Denies any chest pain or discomfort. No sob.  Pt eating snack/drinking fluids.  Pt up/ambulatory to bathroom, steady gait, no dizziness.  Pt remains asymptomatic, and currently appears stable for d/c.        Lajean Saver, MD 11/04/15 [phone]

## 2015-11-19 ENCOUNTER — Encounter: Payer: Self-pay | Admitting: Internal Medicine

## 2015-11-19 ENCOUNTER — Ambulatory Visit (INDEPENDENT_AMBULATORY_CARE_PROVIDER_SITE_OTHER): Payer: Commercial Managed Care - HMO | Admitting: Internal Medicine

## 2015-11-19 VITALS — BP 162/102 | HR 74 | Temp 97.9°F | Resp 18 | Ht 66.0 in | Wt 191.0 lb

## 2015-11-19 DIAGNOSIS — I10 Essential (primary) hypertension: Secondary | ICD-10-CM | POA: Diagnosis not present

## 2015-11-19 DIAGNOSIS — H8113 Benign paroxysmal vertigo, bilateral: Secondary | ICD-10-CM

## 2015-11-19 DIAGNOSIS — H8110 Benign paroxysmal vertigo, unspecified ear: Secondary | ICD-10-CM | POA: Insufficient documentation

## 2015-11-19 MED ORDER — MECLIZINE HCL 25 MG PO TABS: 25.0000 mg | ORAL_TABLET | Freq: Three times a day (TID) | ORAL | Status: DC | PRN

## 2015-11-19 NOTE — Patient Instructions (Signed)
We have sent in some meclizine which you can use for the dizziness up to 3 times per day.  We will also get you in with the experts for the rehab on the balance tubes in the inner ear.   Benign Positional Vertigo Vertigo is the feeling that you or your surroundings are moving when they are not. Benign positional vertigo is the most common form of vertigo. The cause of this condition is not serious (is benign). This condition is triggered by certain movements and positions (is positional). This condition can be dangerous if it occurs while you are doing something that could endanger you or others, such as driving.  CAUSES In many cases, the cause of this condition is not known. It may be caused by a disturbance in an area of the inner ear that helps your brain to sense movement and balance. This disturbance can be caused by a viral infection (labyrinthitis), head injury, or repetitive motion. RISK FACTORS This condition is more likely to develop in:  Women.  People who are 41 years of age or older. SYMPTOMS Symptoms of this condition usually happen when you move your head or your eyes in different directions. Symptoms may start suddenly, and they usually last for less than a minute. Symptoms may include:  Loss of balance and falling.  Feeling like you are spinning or moving.  Feeling like your surroundings are spinning or moving.  Nausea and vomiting.  Blurred vision.  Dizziness.  Involuntary eye movement (nystagmus). Symptoms can be mild and cause only slight annoyance, or they can be severe and interfere with daily life. Episodes of benign positional vertigo may return (recur) over time, and they may be triggered by certain movements. Symptoms may improve over time. DIAGNOSIS This condition is usually diagnosed by medical history and a physical exam of the head, neck, and ears. You may be referred to a health care provider who specializes in ear, nose, and throat (ENT) problems  (otolaryngologist) or a provider who specializes in disorders of the nervous system (neurologist). You may have additional testing, including:  MRI.  A CT scan.  Eye movement tests. Your health care provider may ask you to change positions quickly while he or she watches you for symptoms of benign positional vertigo, such as nystagmus. Eye movement may be tested with an electronystagmogram (ENG), caloric stimulation, the Dix-Hallpike test, or the roll test.  An electroencephalogram (EEG). This records electrical activity in your brain.  Hearing tests. TREATMENT Usually, your health care provider will treat this by moving your head in specific positions to adjust your inner ear back to normal. Surgery may be needed in severe cases, but this is rare. In some cases, benign positional vertigo may resolve on its own in 2-4 weeks. HOME CARE INSTRUCTIONS Safety  Move slowly.Avoid sudden body or head movements.  Avoid driving.  Avoid operating heavy machinery.  Avoid doing any tasks that would be dangerous to you or others if a vertigo episode would occur.  If you have trouble walking or keeping your balance, try using a cane for stability. If you feel dizzy or unstable, sit down right away.  Return to your normal activities as told by your health care provider. Ask your health care provider what activities are safe for you. General Instructions  Take over-the-counter and prescription medicines only as told by your health care provider.  Avoid certain positions or movements as told by your health care provider.  Drink enough fluid to keep your urine clear  or pale yellow.  Keep all follow-up visits as told by your health care provider. This is important. SEEK MEDICAL CARE IF:  You have a fever.  Your condition gets worse or you develop new symptoms.  Your family or friends notice any behavioral changes.  Your nausea or vomiting gets worse.  You have numbness or a "pins and  needles" sensation. SEEK IMMEDIATE MEDICAL CARE IF:  You have difficulty speaking or moving.  You are always dizzy.  You faint.  You develop severe headaches.  You have weakness in your legs or arms.  You have changes in your hearing or vision.  You develop a stiff neck.  You develop sensitivity to light.   This information is not intended to replace advice given to you by your health care provider. Make sure you discuss any questions you have with your health care provider.   Document Released: 08/03/2006 Document Revised: 07/17/2015 Document Reviewed: 02/18/2015 Elsevier Interactive Patient Education [employer].

## 2015-11-19 NOTE — Assessment & Plan Note (Signed)
Given referral for neuro rehab as well as meclizine rx. Also showed her exercises to try for her vertigo symptoms.

## 2015-11-19 NOTE — Progress Notes (Signed)
   Subjective:    Patient ID: Kayla Hopkins, female    DOB: 23-Jun-1961, 55 y.o.   MRN: [id number]  HPI The patient is a 55 YO female coming in for ER follow up for vertigo (given ativan and meclizine with improvement in ER, labs and EKG and CXR normal). Episode started with watching a movie in theatre. Has had daily episodes since that time. Resolve in several minutes and started with bending over or turning head a certain way. She also has them when lying flat. No SOB or cough. Some feeling of pre-syncope or panic when during an episode. Overall stable since ER visit and not improving.   Review of Systems  Constitutional: Negative for fever, activity change, appetite change, fatigue and unexpected weight change.  Respiratory: Negative for cough, chest tightness, shortness of breath and wheezing.   Cardiovascular: Negative for chest pain, palpitations and leg swelling.  Gastrointestinal: Negative for nausea, diarrhea, constipation and abdominal distention.  Musculoskeletal: Positive for myalgias.  Skin: Negative.   Neurological: Positive for dizziness and light-headedness. Negative for syncope, weakness, numbness and headaches.  Psychiatric/Behavioral: Negative.       Objective:   Physical Exam  Constitutional: She appears well-developed and well-nourished.  HENT:  Head: Normocephalic and atraumatic.  Eyes: EOM are normal.  Neck: Normal range of motion.  Cardiovascular: Normal rate and regular rhythm.   Pulmonary/Chest: Effort normal and breath sounds normal.  Abdominal: Soft. Bowel sounds are normal. She exhibits no distension.  Musculoskeletal: She exhibits no edema.  Neurological: Coordination normal.  Looking up and right or up and left causes dizziness.   Skin: Skin is warm and dry.   Filed Vitals:   11/19/15 1340 11/19/15 1343  BP: 170/102 162/102  Pulse: 74 74  Temp: 97.9 F (36.6 C)   TempSrc: Oral   Resp: 18   Height: 5\' 6"  (1.676 m)   Weight: 191 lb (86.637 kg)    SpO2: 98%       Assessment & Plan:

## 2015-11-19 NOTE — Assessment & Plan Note (Signed)
BP elevated today due to acute distress, continue on metoprolol-xl and adjust at next visit if not improved.

## 2015-11-19 NOTE — Progress Notes (Signed)
Pre visit review using our clinic review tool, if applicable. No additional management support is needed unless otherwise documented below in the visit note. 

## 2015-11-27 ENCOUNTER — Other Ambulatory Visit: Payer: Self-pay | Admitting: Cardiovascular Disease

## 2015-12-23 ENCOUNTER — Ambulatory Visit
Payer: Commercial Managed Care - HMO | Attending: Internal Medicine | Admitting: Rehabilitative and Restorative Service Providers"

## 2015-12-23 ENCOUNTER — Encounter: Payer: Self-pay | Admitting: Rehabilitative and Restorative Service Providers"

## 2015-12-23 VITALS — BP 134/91 | HR 56

## 2015-12-23 DIAGNOSIS — R2681 Unsteadiness on feet: Secondary | ICD-10-CM

## 2015-12-23 DIAGNOSIS — R42 Dizziness and giddiness: Secondary | ICD-10-CM | POA: Diagnosis not present

## 2015-12-23 DIAGNOSIS — R269 Unspecified abnormalities of gait and mobility: Secondary | ICD-10-CM | POA: Diagnosis not present
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
## 2015-12-26 ENCOUNTER — Ambulatory Visit: Payer: Commercial Managed Care - HMO | Admitting: Rehabilitative and Restorative Service Providers"

## 2016-01-03 ENCOUNTER — Ambulatory Visit: Payer: Commercial Managed Care - HMO | Admitting: Rehabilitative and Restorative Service Providers"

## 2016-01-10 ENCOUNTER — Ambulatory Visit: Payer: Commercial Managed Care - HMO | Attending: Internal Medicine | Admitting: Physical Therapy

## 2016-01-17 ENCOUNTER — Ambulatory Visit: Payer: Commercial Managed Care - HMO | Admitting: Rehabilitative and Restorative Service Providers"

## 2016-01-24 ENCOUNTER — Ambulatory Visit: Payer: Commercial Managed Care - HMO | Admitting: Rehabilitative and Restorative Service Providers"

## 2016-01-31 ENCOUNTER — Encounter: Payer: Self-pay | Admitting: Obstetrics & Gynecology

## 2016-01-31 ENCOUNTER — Ambulatory Visit (INDEPENDENT_AMBULATORY_CARE_PROVIDER_SITE_OTHER): Payer: Commercial Managed Care - HMO | Admitting: Obstetrics & Gynecology

## 2016-01-31 VITALS — BP 124/68 | HR 74 | Resp 16 | Ht 66.0 in | Wt 196.0 lb

## 2016-01-31 DIAGNOSIS — N901 Moderate vulvar dysplasia: Secondary | ICD-10-CM

## 2016-01-31 NOTE — Patient Instructions (Signed)

## 2016-02-01 ENCOUNTER — Encounter: Payer: Self-pay | Admitting: Obstetrics & Gynecology

## 2016-02-01 NOTE — Progress Notes (Signed)
Patient ID: Kayla Hopkins, female   DOB: [birth year], 55 y.o.   MRN: [id number]  Chief Complaint  Patient presents with  . Procedure    Colposcopy //RB   HPI Kayla Hopkins is a 55 y.o. female G3P3 MWF here for follow-up of vulvar dysplasia.  Pt had hx of vulvar lesion that was excised when she had her hysterectomy.  Pathology showed VIN2 with positive margins at  6, 9 and 12 o'clock locations.  Due to this, follow up colposcopic examination was recommended.    Pt reports she has been successful at continuing her smoking cessation.  She is Advertising account executive.  She also is still off of her anti-platelet medications.  She does take a full ASA daily.  She will have repeat vascular studies on her legs coming up in the next two months.  She states she just "feels great" and is so happy she had the hysterectomy.  Indications: H/O VIN2 with + margins excised 09/23/15 at same time as hysterectomy   Past Medical History  Diagnosis Date  . PAF (paroxysmal atrial fibrillation) ([place])   . Hypertension   . Peripheral vascular disease ([place]) 06/11/2009    Bilateral common iliac kissing stents (8X24 mm) . 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 She underwent successful balloon angioplasty.  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   . Hyperlipidemia   . Tobacco abuse   . Claudication ([place])     clots in legs  . S/P cardiac cath 2009    Normal  . Arthritis   . Depression   . Headache   . Anxiety   . Migraine     tried several medications-no relief  . Herpes     no outbreaks-has Valtrex to take PRN  . Shortness of breath dyspnea     patient states she has had lung test that came back normal  . PONV (postoperative nausea and vomiting)    Social History Social History  Substance Use Topics  . Smoking status: Former Smoker -- 0.25 packs/day  for 40 years    Types: Cigarettes  . Smokeless tobacco: Never Used     Comment: electronic cigarette  . Alcohol Use: No    Review of Systems Review of Systems  All other systems reviewed and are negative.   Blood pressure 124/68, pulse 74, resp. rate 16, height 5\' 6"  (1.676 m), weight 196 lb (88.905 kg), last menstrual period 11/09/2008.  Physical Exam Physical Exam  Constitutional: She appears well-developed and well-nourished.  Genitourinary: Vagina normal.    There is no rash, tenderness, lesion or injury on the right labia. There is no rash, tenderness, lesion or injury on the left labia.  Lymphadenopathy:       Right: No inguinal adenopathy present.       Left: No inguinal adenopathy present.  Skin: Skin is warm and dry.  Psychiatric: She has a normal mood and affect.       Procedure Details  The risks and benefits of the procedure and written informed consent obtained.  3% acetic acid applied to vulva with 4x4's for >3 minutes before inspection of entire vulva occurred with colposcope.  Vulva visualized with 7.5 X and 15 x magnification.  Area of AWE just to right and at superior end of prior WLE noted.  Also, an area of small but very dark  pigmentation (nevus in appearance) noted at midline of introitus.  Recommended both lesions be removed.  Area cleansed with Betadine x 3.  1% Lidocaine instilled.  Lot [id number].  Exp:  01/07/17.  0.6cc total used.  Using sterile pick-ups and sterile scissors, two lesions were biopsied/excised fully.  Lesions labeled and sent the pathology.  Silver nitrate used for hemostasis.  Pt tolerated procedure well.    Specimens: right vulva (consistent with VIN1), midline at introitus (pigmented lesion consistent with nevus)  Complications: none.    Assessment: H/O VIN 2, s/p WLE 11/16 with positive margins H/O smoking, has stopped  Plan: Two specimens sent the pathology.  Results and additional recommendations will be called to pt.       Hale Bogus SUZANNE 02/01/2016, 6:12 AM

## 2016-02-04 DIAGNOSIS — N900 Mild vulvar dysplasia: Secondary | ICD-10-CM | POA: Diagnosis not present

## 2016-02-04 DIAGNOSIS — N904 Leukoplakia of vulva: Secondary | ICD-10-CM | POA: Diagnosis not present

## 2016-02-04 NOTE — Addendum Note (Signed)
Addended by: [name] on: 02/04/2016 10:29 AM   Modules accepted: Orders

## 2016-02-10 ENCOUNTER — Emergency Department (HOSPITAL_COMMUNITY): Payer: Commercial Managed Care - HMO

## 2016-02-10 ENCOUNTER — Observation Stay (HOSPITAL_COMMUNITY)
Admission: EM | Admit: 2016-02-10 | Discharge: 2016-02-12 | Disposition: A | Discharge status: Home / Self Care | Payer: Commercial Managed Care - HMO | Attending: Internal Medicine | Admitting: Internal Medicine

## 2016-02-10 ENCOUNTER — Encounter (HOSPITAL_COMMUNITY): Payer: Self-pay | Admitting: Emergency Medicine

## 2016-02-10 ENCOUNTER — Ambulatory Visit (INDEPENDENT_AMBULATORY_CARE_PROVIDER_SITE_OTHER): Payer: Commercial Managed Care - HMO | Admitting: Emergency Medicine

## 2016-02-10 VITALS — BP 168/102 | HR 97 | Temp 98.3°F | Resp 16 | Ht 66.0 in | Wt 196.6 lb

## 2016-02-10 DIAGNOSIS — R0602 Shortness of breath: Secondary | ICD-10-CM | POA: Insufficient documentation

## 2016-02-10 DIAGNOSIS — E785 Hyperlipidemia, unspecified: Secondary | ICD-10-CM | POA: Diagnosis present

## 2016-02-10 DIAGNOSIS — Z87891 Personal history of nicotine dependence: Secondary | ICD-10-CM | POA: Diagnosis present

## 2016-02-10 DIAGNOSIS — R0789 Other chest pain: Secondary | ICD-10-CM

## 2016-02-10 DIAGNOSIS — Z79899 Other long term (current) drug therapy: Secondary | ICD-10-CM | POA: Diagnosis not present

## 2016-02-10 DIAGNOSIS — Z720 Tobacco use: Secondary | ICD-10-CM

## 2016-02-10 DIAGNOSIS — R000 Tachycardia, unspecified: Secondary | ICD-10-CM | POA: Diagnosis not present

## 2016-02-10 DIAGNOSIS — I480 Paroxysmal atrial fibrillation: Secondary | ICD-10-CM

## 2016-02-10 DIAGNOSIS — Z8041 Family history of malignant neoplasm of ovary: Secondary | ICD-10-CM | POA: Diagnosis not present

## 2016-02-10 DIAGNOSIS — M25512 Pain in left shoulder: Secondary | ICD-10-CM | POA: Insufficient documentation

## 2016-02-10 DIAGNOSIS — R079 Chest pain, unspecified: Secondary | ICD-10-CM | POA: Diagnosis not present

## 2016-02-10 DIAGNOSIS — R51 Headache: Secondary | ICD-10-CM

## 2016-02-10 DIAGNOSIS — Z7982 Long term (current) use of aspirin: Secondary | ICD-10-CM | POA: Diagnosis not present

## 2016-02-10 DIAGNOSIS — G43909 Migraine, unspecified, not intractable, without status migrainosus: Secondary | ICD-10-CM | POA: Insufficient documentation

## 2016-02-10 DIAGNOSIS — Z801 Family history of malignant neoplasm of trachea, bronchus and lung: Secondary | ICD-10-CM | POA: Diagnosis not present

## 2016-02-10 DIAGNOSIS — I10 Essential (primary) hypertension: Secondary | ICD-10-CM | POA: Diagnosis not present

## 2016-02-10 DIAGNOSIS — I739 Peripheral vascular disease, unspecified: Secondary | ICD-10-CM | POA: Diagnosis not present

## 2016-02-10 DIAGNOSIS — Z8249 Family history of ischemic heart disease and other diseases of the circulatory system: Secondary | ICD-10-CM | POA: Insufficient documentation

## 2016-02-10 DIAGNOSIS — S46912A Strain of unspecified muscle, fascia and tendon at shoulder and upper arm level, left arm, initial encounter: Secondary | ICD-10-CM

## 2016-02-10 DIAGNOSIS — R519 Headache, unspecified: Secondary | ICD-10-CM

## 2016-02-10 DIAGNOSIS — I4891 Unspecified atrial fibrillation: Secondary | ICD-10-CM

## 2016-02-10 DIAGNOSIS — M542 Cervicalgia: Secondary | ICD-10-CM | POA: Diagnosis not present

## 2016-02-10 DIAGNOSIS — I482 Chronic atrial fibrillation: Secondary | ICD-10-CM | POA: Diagnosis not present

## 2016-02-10 HISTORY — DX: Post-traumatic stress disorder, unspecified: F43.10

## 2016-02-10 LAB — HEPARIN LEVEL (UNFRACTIONATED): HEPARIN UNFRACTIONATED: 0.64 [IU]/mL (ref 0.30–0.70)

## 2016-02-10 LAB — BASIC METABOLIC PANEL
Anion gap: 12 (ref 5–15)
BUN: 15 mg/dL (ref 6–20)
CALCIUM: 9.2 mg/dL (ref 8.9–10.3)
CHLORIDE: 106 mmol/L (ref 101–111)
CO2: 23 mmol/L (ref 22–32)
CREATININE: 0.9 mg/dL (ref 0.44–1.00)
GFR calc non Af Amer: >60 mL/min (ref >60)
Glucose, Bld: 109 mg/dL — ABNORMAL HIGH (ref 65–99)
Potassium: 4.2 mmol/L (ref 3.5–5.1)
SODIUM: 141 mmol/L (ref 135–145)

## 2016-02-10 LAB — CBC
HCT: 42.1 % (ref 36.0–46.0)
Hemoglobin: 13.5 g/dL (ref 12.0–15.0)
MCH: 28.4 pg (ref 26.0–34.0)
MCHC: 32.1 g/dL (ref 30.0–36.0)
MCV: 88.4 fL (ref 78.0–100.0)
PLATELETS: 241 10*3/uL (ref 150–400)
RBC: 4.76 MIL/uL (ref 3.87–5.11)
RDW: 13.4 % (ref 11.5–15.5)
WBC: 7.2 10*3/uL (ref 4.0–10.5)

## 2016-02-10 LAB — TSH: TSH: 1.124 u[IU]/mL (ref 0.350–4.500)

## 2016-02-10 LAB — I-STAT TROPONIN, ED: TROPONIN I, POC: 0.01 ng/mL (ref 0.00–0.08)

## 2016-02-10 LAB — TROPONIN I: Troponin I: <0.03 ng/mL (ref <0.031)

## 2016-02-10 MED ORDER — PANTOPRAZOLE SODIUM 40 MG PO TBEC
40.0000 mg | DELAYED_RELEASE_TABLET | Freq: Every day | ORAL | Status: DC
  Administered 2016-02-10 – 2016-02-12 (×2): 40 mg via ORAL
  Filled 2016-02-10 (×2): qty 1

## 2016-02-10 MED ORDER — OXYCODONE-ACETAMINOPHEN 5-325 MG PO TABS
1.0000 | ORAL_TABLET | ORAL | Status: DC | PRN
  Administered 2016-02-10: 1 via ORAL
  Administered 2016-02-10: 2 via ORAL
  Filled 2016-02-10: qty 1
  Filled 2016-02-10 (×2): qty 2

## 2016-02-10 MED ORDER — MORPHINE SULFATE (PF) 4 MG/ML IV SOLN
4.0000 mg | Freq: Once | INTRAVENOUS | Status: AC
  Administered 2016-02-10: 4 mg via INTRAVENOUS
  Filled 2016-02-10: qty 1

## 2016-02-10 MED ORDER — OMEGA-3-ACID ETHYL ESTERS 1 G PO CAPS
1.0000 g | ORAL_CAPSULE | Freq: Every day | ORAL | Status: DC
  Administered 2016-02-10 – 2016-02-11 (×2): 1 g via ORAL
  Filled 2016-02-10 (×2): qty 1

## 2016-02-10 MED ORDER — HEPARIN BOLUS VIA INFUSION
5000.0000 [IU] | Freq: Once | INTRAVENOUS | Status: AC
  Administered 2016-02-10: 5000 [IU] via INTRAVENOUS
  Filled 2016-02-10: qty 5000

## 2016-02-10 MED ORDER — GI COCKTAIL ~~LOC~~
30.0000 mL | Freq: Four times a day (QID) | ORAL | Status: DC | PRN
  Administered 2016-02-10: 30 mL via ORAL
  Filled 2016-02-10: qty 30

## 2016-02-10 MED ORDER — METOPROLOL SUCCINATE ER 50 MG PO TB24
50.0000 mg | ORAL_TABLET | Freq: Every day | ORAL | Status: DC
  Administered 2016-02-10 – 2016-02-11 (×2): 50 mg via ORAL
  Filled 2016-02-10 (×2): qty 1

## 2016-02-10 MED ORDER — DILTIAZEM HCL 25 MG/5ML IV SOLN
10.0000 mg | Freq: Once | INTRAVENOUS | Status: AC
  Administered 2016-02-10: 10 mg via INTRAVENOUS
  Filled 2016-02-10: qty 5

## 2016-02-10 MED ORDER — DILTIAZEM HCL 100 MG IV SOLR
5.0000 mg/h | INTRAVENOUS | Status: DC
  Administered 2016-02-10: 5 mg/h via INTRAVENOUS
  Filled 2016-02-10: qty 100

## 2016-02-10 MED ORDER — ACETAMINOPHEN 325 MG PO TABS
650.0000 mg | ORAL_TABLET | ORAL | Status: DC | PRN
  Administered 2016-02-10 – 2016-02-11 (×2): 650 mg via ORAL
  Filled 2016-02-10 (×2): qty 2

## 2016-02-10 MED ORDER — ATORVASTATIN CALCIUM 40 MG PO TABS
40.0000 mg | ORAL_TABLET | Freq: Every day | ORAL | Status: DC
  Administered 2016-02-10 – 2016-02-11 (×2): 40 mg via ORAL
  Filled 2016-02-10 (×2): qty 1

## 2016-02-10 MED ORDER — ASPIRIN EC 81 MG PO TBEC
81.0000 mg | DELAYED_RELEASE_TABLET | Freq: Every day | ORAL | Status: DC
  Administered 2016-02-10 – 2016-02-11 (×2): 81 mg via ORAL
  Filled 2016-02-10 (×2): qty 1

## 2016-02-10 MED ORDER — MORPHINE SULFATE (PF) 2 MG/ML IV SOLN
2.0000 mg | INTRAVENOUS | Status: DC | PRN
  Administered 2016-02-10 – 2016-02-11 (×2): 2 mg via INTRAVENOUS
  Filled 2016-02-10 (×2): qty 1

## 2016-02-10 MED ORDER — HYDRALAZINE HCL 20 MG/ML IJ SOLN
10.0000 mg | Freq: Four times a day (QID) | INTRAMUSCULAR | Status: DC | PRN
  Administered 2016-02-10: 10 mg via INTRAVENOUS
  Filled 2016-02-10: qty 1

## 2016-02-10 MED ORDER — DEXTROSE 5 % IV SOLN
500.0000 mg | Freq: Four times a day (QID) | INTRAVENOUS | Status: DC | PRN
  Administered 2016-02-10: 500 mg via INTRAVENOUS
  Filled 2016-02-10 (×2): qty 5

## 2016-02-10 MED ORDER — OMEGA-3 FATTY ACIDS 1000 MG PO CAPS: 1.0000 g | ORAL_CAPSULE | Freq: Every day | ORAL | Status: DC

## 2016-02-10 MED ORDER — ONDANSETRON HCL 4 MG/2ML IJ SOLN
4.0000 mg | Freq: Four times a day (QID) | INTRAMUSCULAR | Status: DC | PRN
Start: 2016-02-10 — End: 2016-02-12
  Administered 2016-02-11: 4 mg via INTRAVENOUS
  Filled 2016-02-10: qty 2

## 2016-02-10 MED ORDER — HEPARIN (PORCINE) IN NACL 100-0.45 UNIT/ML-% IJ SOLN
1200.0000 [IU]/h | INTRAMUSCULAR | Status: DC
  Administered 2016-02-10 – 2016-02-11 (×2): 1150 [IU]/h via INTRAVENOUS
  Filled 2016-02-10 (×2): qty 250

## 2016-02-10 NOTE — Consult Note (Signed)
Reason for Consult: Atrial Fibrillation/Anticoagulation   Referring Physician: Kathie Dike  PCP:  Hoyt Koch, MD  Primary Cardiologist: Dr. Kathlyn Sacramento  Kayla Hopkins is an 55 y.o. female.    Chief Complaint: Chest pain  HPI: 55 y.o. Female with a history of peripheral vascular disease, tobacco use, hypertension, paroxysmal atrial fibrillation and hyperlipidemia presents to the hospital with complaints of chest pain. Patient reports that yesterday around 12 noon she was holding her grandson and her arms for an extended period of time. She developed pain in her left shoulder and neck which she felt was musculoskeletal. She went to bed and overnight she woke up with complaints of chest pressure about 0400am which she describes as different from her shoulder pain. She describes this as a pressure type of discomfort, associated nausea, shortness of breath, palpitations. She called EMS and received nitroglycerin which she reports resolved her discomfort. States she last saw cardiology back in December and was taken off Plavix prior to have a hysterectomy. Last Echo 2016 showed ejection fraction was in the range of 65% to 70%. Wall motion was normal; there were no regional wall motion abnormalities. Last stress test in 2013 was normal.  Pt currently taking Toprol-XL for HTN currently but no previous on any anticoagulation. Started on Heparin drip by Internal Medicine, but will need long term coagulation therapy.   EKG: A-fib new today compared to Dec 2016, but present on previous EKG in Feb 2016.  Troponins negative.   Currently pt states she feeling better after being given medication in the ER. Pain is reproducable and tender to palpation under the left breast. Worse with deep inspiration.   Past Medical History  Diagnosis Date  . PAF (paroxysmal atrial fibrillation) ([place])   . Hypertension   . Peripheral vascular disease ([place]) 06/11/2009    Bilateral  common iliac kissing stents (8X24 mm) . 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 She underwent successful balloon angioplasty.  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   . Hyperlipidemia   . Tobacco abuse     stopped smoking, 2016  . Claudication ([place])        . S/P cardiac cath 2009    Normal  . Arthritis   . Depression   . Headache   . Anxiety   . Migraine     tried several medications-no relief  . Herpes     no outbreaks-has Valtrex to take PRN  . Shortness of breath dyspnea     patient states she has had lung test that came back normal    Past Surgical History  Procedure Laterality Date  . Cardiac catheterization  2009    with stents  . Lumbar disc surgery      spinal stenosis  . Knee arthroscopy Bilateral   . Abdominal angiogram N/A 05/11/2012    Procedure: ABDOMINAL ANGIOGRAM;  Surgeon: [name], MD;  Location: [facility] CATH LAB;  Service: Cardiovascular;  Laterality: N/A;  . Abdominal aortagram N/A 10/19/2012    Procedure: ABDOMINAL Maxcine Ham;  Surgeon: [name], MD;  Location: [facility] CATH LAB;  Service: Cardiovascular;  Laterality: N/A;  . Abdominal aortagram N/A 05/10/2013    Procedure: ABDOMINAL Maxcine Ham;  Surgeon: [name], MD;  Location: [facility] CATH LAB;  Service: Cardiovascular;  Laterality:  N/A;  . Robotic assisted total hysterectomy with salpingectomy Bilateral 09/23/2015    Procedure: ROBOTIC ASSISTED TOTAL HYSTERECTOMY BILATERALSALPINGECTOMY AND OOPHORECTOMY;  Surgeon: [name], MD;  Location: [facility];  Service: Gynecology;  Laterality: Bilateral;  . Vulvectomy N/A 09/23/2015    Procedure: WIDE EXCISION VULVECTOMY  WLE of vulvar VIN 2;  Surgeon: [name], MD;  Location: [facility];  Service: Gynecology;  Laterality: N/A;    Family History  Problem Relation Age of Onset  . Hypertension Mother    . Heart disease Father   . Heart attack Father   . Hypertension Brother   . Ovarian cancer Mother   . Lung cancer Mother   . Colon polyps Sister   . Cancer Maternal Grandfather     lung  . Cancer Paternal Grandfather     throat    Social History:  reports that she has quit smoking. Her smoking use included Cigarettes. She has a 10 pack-year smoking history. She has never used smokeless tobacco. She reports that she does not drink alcohol or use illicit drugs.  Allergies:  Allergies  Allergen Reactions  . Pradaxa [Dabigatran Etexilate Mesylate]     Gastritis   . Zocor [Simvastatin]     Leg pains     OUTPATIENT medications:  No current facility-administered medications on file prior to encounter.   Current Outpatient Prescriptions on File Prior to Encounter  Medication Sig Dispense Refill  . acetaminophen (TYLENOL) 325 MG tablet Take 2 tablets (650 mg total) by mouth every 6 (six) hours as needed for mild pain (or Fever >/= 101).    [name] aspirin EC 81 MG tablet Take 81 mg by mouth at bedtime.     [name] atorvastatin (LIPITOR) 40 MG tablet TAKE 1 TABLET EVERY DAY  AT  6PM (Patient taking differently: TAKE 1 TABLET EVERY DAY  AT bedtime) 90 tablet 3  . esomeprazole (NEXIUM) 20 MG capsule Take 20 mg by mouth at bedtime.     . fish oil-omega-3 fatty acids 1000 MG capsule Take 1 g by mouth at bedtime.     . metoprolol succinate (TOPROL-XL) 50 MG 24 hr tablet TAKE 1 TABLET DAILY. TAKE WITH OR IMMEDIATELY FOLLOWING A MEAL. (Patient taking differently: TAKE 1 TABLET AT BEDTIME) 90 tablet 3     Results for orders placed or performed during the hospital encounter of 02/10/16 (from the past 48 hour(s))  Basic metabolic panel     Status: Abnormal   Collection Time: 02/10/16 10:57 AM  Result Value Ref Range   Sodium 141 135 - 145 mmol/L   Potassium 4.2 3.5 - 5.1 mmol/L   Chloride 106 101 - 111 mmol/L   CO2 23 22 - 32 mmol/L   Glucose, Bld 109 (H) 65 - 99 mg/dL   BUN 15 6 - 20 mg/dL    Creatinine, Ser 0.90 0.44 - 1.00 mg/dL   Calcium 9.2 8.9 - 10.3 mg/dL   GFR calc non Af Amer >60 >60 mL/min   GFR calc Af Amer >60 >60 mL/min    Comment: (NOTE) The eGFR has been calculated using the CKD EPI equation. This calculation has not been validated in all clinical situations. eGFR's persistently <60 mL/min signify possible Chronic Kidney Disease.    Anion gap 12 5 - 15  CBC     Status: None   Collection Time: 02/10/16 10:57 AM  Result Value Ref Range   WBC 7.2 4.0 - 10.5 K/uL   RBC 4.76 3.87 - 5.11 MIL/uL  Hemoglobin 13.5 12.0 - 15.0 g/dL   HCT 42.1 36.0 - 46.0 %   MCV 88.4 78.0 - 100.0 fL   MCH 28.4 26.0 - 34.0 pg   MCHC 32.1 30.0 - 36.0 g/dL   RDW 13.4 11.5 - 15.5 %   Platelets 241 150 - 400 K/uL  I-stat troponin, ED (not at [facility])     Status: None   Collection Time: 02/10/16 10:57 AM  Result Value Ref Range   Troponin i, poc 0.01 0.00 - 0.08 ng/mL   Comment 3            Comment: Due to the release kinetics of cTnI, a negative result within the first hours of the onset of symptoms does not rule out myocardial infarction with certainty. If myocardial infarction is still suspected, repeat the test at appropriate intervals.   Troponin I-serum (0, 3, 6 hours)     Status: None   Collection Time: 02/10/16  1:25 PM  Result Value Ref Range   Troponin I <0.03 <0.031 ng/mL    Comment:        NO INDICATION OF MYOCARDIAL INJURY.    Dg Chest 2 View  02/10/2016  CLINICAL DATA:  Chest pain radiating to left shoulder and left arm. EXAM: CHEST  2 VIEW COMPARISON:  11/04/2015 FINDINGS: Heart is upper limits normal in size. No confluent airspace opacities or effusions. No acute bony abnormality. IMPRESSION: No active cardiopulmonary disease. Electronically Signed   By: Rolm Baptise M.D.   On: 02/10/2016 11:45    ROS: General:no colds or fevers, no weight changes Skin:no rashes or ulcers HEENT:no blurred vision, no congestion CV:see HPI PUL:see HPI GI:no diarrhea  constipation or melena, no indigestion GU:no hematuria, no dysuria MS:no joint pain, no claudication Neuro:no syncope, no lightheadedness Endo:no diabetes, no thyroid disease  Blood pressure 164/90, pulse 78, temperature 98 F (36.7 C), temperature source Oral, resp. rate 17, height _0  (1.676 m), weight 196 lb (88.905 kg), last menstrual period 11/09/2008, SpO2 98 %.  Wt Readings from Last 3 Encounters:  02/10/16 196 lb (88.905 kg)  02/10/16 196 lb 9.6 oz (89.177 kg)  01/31/16 196 lb (88.905 kg)    PE: General:Pleasant affect, NAD Skin:Warm and dry, brisk capillary refill HEENT:normocephalic, sclera clear, mucus membranes moist Neck:supple, no JVD, no bruits  Heart:S1S2 Irregular rhythm without murmur, gallup, rub or click. Lungs:clear without rales, rhonchi, or wheezes UXN:ATFT, non tender, + BS, do not palpate liver spleen or masses Ext:no lower ext edema, 2+ pedal pulses, 2+ radial pulses Neuro:alert and oriented X 3, MAE, follows commands, + facial symmetry  Left cardiac cath: 03/2011  FINDINGS: 1. Left Main: Normal. 2. LAD: Normal. 3. D1: Normal. 4. Large ramus intermedius: Normal. 5. LCX: Small, nondominant, and normal appearing. 6. RCA: Dominant and normal appearing. 7. LV: EF 65%. No wall motion abnormalities. LVDP is 17 mmHg.  IMPRESSION: 1. Normal-appearing coronary arteries. 2. Preserved left ventricular systolic function, ejection fraction  65%, left ventricular diastolic pressure is 17 mmHg.  Assessment/Plan  Principal Problem:   Chest pressure Active Problems:   HTN (hypertension)   Hyperlipidemia   Peripheral vascular disease (HCC)   Tobacco use   PAF (paroxysmal atrial fibrillation) (HCC)   Chest pain  1. Chest pain- Troponins negative, may need repeat nuclear study 2. Atrial fibrillation- Currently on Heparin drip by Internal Medicine, hx of gastritis on pradaxa.  ? Begin eliquis.  anticoagulation therapy.     [place]  Nurse Practitioner Certified [facility] HEART[facility] Pager [phone] or after 5pm or weekends call [phone] 02/10/2016, 2:37 PM   The patient was seen, examined and discussed with Cecilie Kicks, NP and I agree with the above.    55 y.o. Female with a history of peripheral vascular disease, tobacco use, hypertension, paroxysmal atrial fibrillation, hyperlipidemia presents to the hospital with complaints of chest pain. The patient states that she woke up this morning and felt that she is in A. fib with rapid ventricular response and it progressed to chest pain eventually to shortness of breath. Nitroglycerin gave her some relief. She is not on anticoagulation even though she states that she goes in and out of A. fib frequently but it usually only last couple hours. The patient underwent cardiac catheterization in 2009 that was completely normal.   Physical exam shows mild crackles at both lung bases, patient has irregular heartbeats with rapid ventricular response.  Her EKG shows A. fib with RVR and nonspecific ST-T wave abnormalities. This is changed from the prior EKG that showed normal sinus rhythm. We'll repeat echocardiogram to evaluate for LVEF. Troponin is negative 2. We will control her ventricular rate starting her on Cardizem 5 mg IV per hour and uptitrate as needed. Agree with continuing IV heparin. We will plan for a Lexiscan nuclear stress test tomorrow and if negative we will switch to Elliquis 5 mg by mouth twice a day she states that she previously had gastritis with Pradaxa. [id number] It seems that she doesn't tolerate to be in atrial fibrillation and once her rate is controlled or she cardioverted we will switch her to oral Cardizem. If she doesn't cardioverted we will consider TEE/DCCV.   Dorothy Spark 02/10/2016

## 2016-02-10 NOTE — Progress Notes (Signed)
ANTICOAGULATION CONSULT NOTE - Follow up [place] for Heparin Indication: atrial fibrillation  Allergies  Allergen Reactions  . Pradaxa [Dabigatran Etexilate Mesylate]     Gastritis   . Zocor [Simvastatin]     Leg pains     Patient Measurements: Height: 5\' 6"  (167.6 cm) Weight: 194 lb 1.6 oz (88.043 kg) IBW/kg (Calculated) : 59.3 Heparin Dosing Weight: 78.5  Vital Signs: Temp: 98 F (36.7 C) (04/03 1932) Temp Source: Oral (04/03 1932) BP: 135/76 mmHg (04/03 1932) Pulse Rate: 105 (04/03 1932)  Labs:  Recent Labs  02/10/16 1057 02/10/16 1325 02/10/16 1822 02/10/16 2137  HGB 13.5  --   --   --   HCT 42.1  --   --   --   PLT 241  --   --   --   HEPARINUNFRC  --   --   --  0.64  CREATININE 0.90  --   --   --   TROPONINI  --  <0.03 <0.03  --     Estimated Creatinine Clearance: 81.7 mL/min (by C-G formula based on Cr of 0.9).   Medical History: Past Medical History  Diagnosis Date  . PAF (paroxysmal atrial fibrillation) ([place])   . Hypertension   . Peripheral vascular disease ([place]) 06/11/2009    Bilateral common iliac kissing stents (8X24 mm) . 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 She underwent successful balloon angioplasty.  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   . Hyperlipidemia   . Tobacco abuse     stopped smoking, 2016  . Claudication ([place])        . S/P cardiac cath 2009    Normal  . Arthritis   . Depression   . Headache   . Anxiety   . Migraine     tried several medications-no relief  . Herpes     no outbreaks-has Valtrex to take PRN  . Shortness of breath dyspnea     patient states she has had lung test that came back normal    Assessment: 54-yo F presents with complaint of chest pain.  PMH includes peripheral vascular disease, hypertension, hyperlipidemia and  tobacco use.  EKG revealed Atrial Fibrillation.  Pharmacy consulted to start heparin.    HL 0.64 on 1150 units/hr, no issues with heparin per RN and no sxs of bleeding  Goal of Therapy:  Heparin level 0.3-0.7 units/ml Monitor platelets by anticoagulation protocol: Yes   Plan:  1. Continue heparin at 1150 units/hr 2. Obtain daily heparin level, CBC   Vincenza Hews, PharmD, BCPS 02/10/2016, 10:41 PM Pager: [phone]

## 2016-02-10 NOTE — ED Notes (Signed)
Nurse spoke with pharmacy will be sending Robaxin via tube station.

## 2016-02-10 NOTE — ED Notes (Signed)
Spoke with the admit Doctor regarding patient's blood pressure and chest pain.

## 2016-02-10 NOTE — ED Notes (Signed)
Admit Doctor at bedside.  

## 2016-02-10 NOTE — ED Notes (Signed)
Onset one day ago holding her 18 month old grandchild all day yesterday and developed left shoulder and left neck pain. Today 0400 developed chest pain heaviness seen at Urgent Care today sent to ED for evaluation.  Prior to arrival EKG afib, aspirin 324 mg and 1 nitro at urgent care and EMS administered 1 nitro en route. Currently pain chest pain 4/10 heavy feeling and left neck/shoulder pain 8/10 achy throbbing pain.

## 2016-02-10 NOTE — ED Notes (Signed)
EKG completed given to EDP.  

## 2016-02-10 NOTE — ED Provider Notes (Signed)
CSN: [id number]     Arrival date & time 02/10/16  1036 History   First MD Initiated Contact with Patient 02/10/16 1115     Chief Complaint  Patient presents with  . Chest Pain  . Neck Pain  . Shoulder Pain     (Consider location/radiation/quality/duration/timing/severity/associated sxs/prior Treatment) HPI  55 year old female presents with chest pain/pressure that has resolved. Was present since 4 am for about 4 hours. Resolved after 2 nitro by urgent care. Was clammy when pain first started. Nausea and dyspnea as well. Also c/o severe left shoulder/neck pain that started yesterday. She thinks this is because she was holding her grandchild all day yesterday. Severe pain with movement. No weakness/numbness. CP has resolved but does not seem connected with shoulder.  Past Medical History  Diagnosis Date  . PAF (paroxysmal atrial fibrillation) ([place])   . Hypertension   . Peripheral vascular disease ([place]) 06/11/2009    Bilateral common iliac kissing stents (8X24 mm) . 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 She underwent successful balloon angioplasty.  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   . Hyperlipidemia   . Tobacco abuse     stopped smoking, 2016  . Claudication ([place])        . S/P cardiac cath 2009    Normal  . Arthritis   . Depression   . Headache   . Anxiety   . Migraine     tried several medications-no relief  . Herpes     no outbreaks-has Valtrex to take PRN  . Shortness of breath dyspnea     patient states she has had lung test that came back normal   Past Surgical History  Procedure Laterality Date  . Cardiac catheterization  2009    with stents  . Lumbar disc surgery      spinal stenosis  . Knee arthroscopy Bilateral   . Abdominal angiogram N/A 05/11/2012    Procedure: ABDOMINAL ANGIOGRAM;  Surgeon:  [name], MD;  Location: [facility] CATH LAB;  Service: Cardiovascular;  Laterality: N/A;  . Abdominal aortagram N/A 10/19/2012    Procedure: ABDOMINAL Maxcine Ham;  Surgeon: [name], MD;  Location: [facility] CATH LAB;  Service: Cardiovascular;  Laterality: N/A;  . Abdominal aortagram N/A 05/10/2013    Procedure: ABDOMINAL Maxcine Ham;  Surgeon: [name], MD;  Location: [facility] CATH LAB;  Service: Cardiovascular;  Laterality: N/A;  . Robotic assisted total hysterectomy with salpingectomy Bilateral 09/23/2015    Procedure: ROBOTIC ASSISTED TOTAL HYSTERECTOMY BILATERALSALPINGECTOMY AND OOPHORECTOMY;  Surgeon: [name], MD;  Location: [facility];  Service: Gynecology;  Laterality: Bilateral;  . Vulvectomy N/A 09/23/2015    Procedure: WIDE EXCISION VULVECTOMY  WLE of vulvar VIN 2;  Surgeon: [name], MD;  Location: [facility];  Service: Gynecology;  Laterality: N/A;   Family History  Problem Relation Age of Onset  . Hypertension Mother   . Heart disease Father   . Heart attack Father   . Hypertension Brother   . Ovarian cancer Mother   . Lung cancer Mother   . Colon polyps Sister   . Cancer Maternal Grandfather     lung  . Cancer Paternal Grandfather     throat    Social History  Substance Use Topics  . Smoking status: Former Smoker -- 0.25 packs/day for 40 years  Types: Cigarettes  . Smokeless tobacco: Never Used     Comment: electronic cigarette  . Alcohol Use: No   OB History    Gravida Para Term Preterm AB TAB SAB Ectopic Multiple Living   3 3        3      Review of Systems  Constitutional: Positive for diaphoresis. Negative for fever.  Respiratory: Positive for shortness of breath.   Cardiovascular: Positive for chest pain. Negative for palpitations.  Gastrointestinal: Positive for nausea. Negative for vomiting.  Musculoskeletal: Positive for arthralgias.  Neurological: Negative for weakness and numbness.  All other systems reviewed and are negative.     Allergies   Pradaxa and Zocor  Home Medications   Prior to Admission medications   Medication Sig Start Date End Date Taking? Authorizing Provider  acetaminophen (TYLENOL) 325 MG tablet Take 2 tablets (650 mg total) by mouth every 6 (six) hours as needed for mild pain (or Fever >/= 101). 03/09/14  Yes [name], MD  aspirin EC 81 MG tablet Take 81 mg by mouth at bedtime.    Yes Historical Provider, MD  atorvastatin (LIPITOR) 40 MG tablet TAKE 1 TABLET EVERY DAY  AT  6PM Patient taking differently: TAKE 1 TABLET EVERY DAY  AT bedtime 11/27/15  Yes [name], MD  esomeprazole (NEXIUM) 20 MG capsule Take 20 mg by mouth at bedtime.    Yes Historical Provider, MD  fish oil-omega-3 fatty acids 1000 MG capsule Take 1 g by mouth at bedtime.    Yes Historical Provider, MD  metoprolol succinate (TOPROL-XL) 50 MG 24 hr tablet TAKE 1 TABLET DAILY. TAKE WITH OR IMMEDIATELY FOLLOWING A MEAL. Patient taking differently: TAKE 1 TABLET AT BEDTIME 10/15/15  Yes [name], MD   BP 162/103 mmHg  Pulse 57  Temp(Src) 98 F (36.7 C) (Oral)  Resp 16  Ht 5\' 6"  (1.676 m)  Wt 196 lb (88.905 kg)  BMI 31.65 kg/m2  SpO2 97%  LMP 11/09/2008 Physical Exam  Constitutional: She is oriented to person, place, and time. She appears well-developed and well-nourished.  HENT:  Head: Normocephalic and atraumatic.  Right Ear: External ear normal.  Left Ear: External ear normal.  Nose: Nose normal.  Eyes: Right eye exhibits no discharge. Left eye exhibits no discharge.  Cardiovascular: Normal rate, regular rhythm and normal heart sounds.   Pulses:      Radial pulses are 2+ on the right side, and 2+ on the left side.  Pulmonary/Chest: Effort normal and breath sounds normal.  Abdominal: Soft. There is no tenderness.  Musculoskeletal:       Left shoulder: She exhibits tenderness.       Arms: Neurological: She is alert and oriented to person, place, and time.  Skin: Skin is warm and dry.  Nursing note and  vitals reviewed.   ED Course  Procedures (including critical care time) Labs Review Labs Reviewed  BASIC METABOLIC PANEL - Abnormal; Notable for the following:    Glucose, Bld 109 (*)    All other components within normal limits  CBC  I-STAT TROPOININ, ED    Imaging Review Dg Chest 2 View  02/10/2016  CLINICAL DATA:  Chest pain radiating to left shoulder and left arm. EXAM: CHEST  2 VIEW COMPARISON:  11/04/2015 FINDINGS: Heart is upper limits normal in size. No confluent airspace opacities or effusions. No acute bony abnormality. IMPRESSION: No active cardiopulmonary disease. Electronically Signed   By: Rolm Baptise M.D.   On:  02/10/2016 11:45   I have personally reviewed and evaluated these images and lab results as part of my medical decision-making.   EKG Interpretation   Date/Time:  Monday February 10 2016 10:37:14 EDT Ventricular Rate:  100 PR Interval:    QRS Duration: 85 QT Interval:  360 QTC Calculation: 464 R Axis:   66 Text Interpretation:  Atrial fibrillation new compared to Dec 2016 but  present in Feb 2016 Confirmed by Ein Rijo  MD, Rutledge Selsor ([id number]) on 02/10/2016  11:04:47 AM      MDM   Final diagnoses:  Chest pressure  Muscle strain of left shoulder region, initial encounter    Patient's chest pressure at rest in middle of night is concerning for cardiac cause. Initial troponin negative. ECG unremarkable besides afib with patient has intermittently. She is not symptomatic from that. Moderate HEART risk score based on risk factors and presentation. Shoulder seems like a muscle strain. Doubt fracture with no trauma, no xray warranted. Admit to hospitalist.    Sherwood Gambler, MD 02/11/16 [phone]

## 2016-02-10 NOTE — H&P (Signed)
[facility] History and Physical  Kayla Hopkins DOB: 12/05/1960 DOA: 02/10/2016  Referring physician: Dr. Regenia Skeeter PCP: Hoyt Koch, MD   Chief Complaint: chest pain  HPI: Kayla Hopkins is a 55 y.o. female with history of peripheral vascular disease, tobacco use, hypertension and hyperlipidemia presents to the hospital with complaints of chest pain. Patient reports that yesterday she was holding her grandson and her arms for an extended period of time. She developed pain in her left shoulder and neck which she felt was musculoskeletal. She went to bed and overnight she woke up with complaints of chest pressure which she describes as different from her shoulder pain. She describes this as a pressure type of discomfort, associated nausea, shortness of breath, palpitations. She called EMS and received nitroglycerin which she reports resolved her discomfort. She's not had any recurrence of discomfort. Due to the patient's risk factors, she's been referred for admission.   Review of Systems:  Pertinent positives as per history of present illness, otherwise negative  Past Medical History  Diagnosis Date  . PAF (paroxysmal atrial fibrillation) ([place])   . Hypertension   . Peripheral vascular disease ([place]) 06/11/2009    Bilateral common iliac kissing stents (8X24 mm) . 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 She underwent successful balloon angioplasty.  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   . Hyperlipidemia   . Tobacco abuse     stopped smoking, 2016  . Claudication ([place])        . S/P cardiac cath 2009    Normal  . Arthritis   . Depression   . Headache   . Anxiety   . Migraine     tried several medications-no relief  . Herpes     no outbreaks-has Valtrex to take PRN  . Shortness of breath  dyspnea     patient states she has had lung test that came back normal   Past Surgical History  Procedure Laterality Date  . Cardiac catheterization  2009    with stents  . Lumbar disc surgery      spinal stenosis  . Knee arthroscopy Bilateral   . Abdominal angiogram N/A 05/11/2012    Procedure: ABDOMINAL ANGIOGRAM;  Surgeon: [name], MD;  Location: [facility] CATH LAB;  Service: Cardiovascular;  Laterality: N/A;  . Abdominal aortagram N/A 10/19/2012    Procedure: ABDOMINAL Maxcine Ham;  Surgeon: [name], MD;  Location: [facility] CATH LAB;  Service: Cardiovascular;  Laterality: N/A;  . Abdominal aortagram N/A 05/10/2013    Procedure: ABDOMINAL Maxcine Ham;  Surgeon: [name], MD;  Location: [facility] CATH LAB;  Service: Cardiovascular;  Laterality: N/A;  . Robotic assisted total hysterectomy with salpingectomy Bilateral 09/23/2015    Procedure: ROBOTIC ASSISTED TOTAL HYSTERECTOMY BILATERALSALPINGECTOMY AND OOPHORECTOMY;  Surgeon: [name], MD;  Location: [facility];  Service: Gynecology;  Laterality: Bilateral;  . Vulvectomy N/A 09/23/2015    Procedure: WIDE EXCISION VULVECTOMY  WLE of vulvar VIN 2;  Surgeon: [name], MD;  Location: [facility];  Service: Gynecology;  Laterality: N/A;   Social History:  reports that she has quit smoking. Her smoking use included Cigarettes. She has a 10 pack-year smoking history. She has never used smokeless tobacco. She reports that she does not drink alcohol or use illicit drugs.  Allergies  Allergen Reactions  .  Pradaxa [Dabigatran Etexilate Mesylate]     Gastritis   . Zocor [Simvastatin]     Leg pains     Family History  Problem Relation Age of Onset  . Hypertension Mother   . Heart disease Father   . Heart attack Father   . Hypertension Brother   . Ovarian cancer Mother   . Lung cancer Mother   . Colon polyps Sister   . Cancer Maternal Grandfather     lung  . Cancer Paternal Grandfather     throat     Prior to Admission medications    Medication Sig Start Date End Date Taking? Authorizing Provider  acetaminophen (TYLENOL) 325 MG tablet Take 2 tablets (650 mg total) by mouth every 6 (six) hours as needed for mild pain (or Fever >/= 101). 03/09/14  Yes [name], MD  aspirin EC 81 MG tablet Take 81 mg by mouth at bedtime.    Yes Historical Provider, MD  atorvastatin (LIPITOR) 40 MG tablet TAKE 1 TABLET EVERY DAY  AT  6PM Patient taking differently: TAKE 1 TABLET EVERY DAY  AT bedtime 11/27/15  Yes [name], MD  esomeprazole (NEXIUM) 20 MG capsule Take 20 mg by mouth at bedtime.    Yes Historical Provider, MD  fish oil-omega-3 fatty acids 1000 MG capsule Take 1 g by mouth at bedtime.    Yes Historical Provider, MD  metoprolol succinate (TOPROL-XL) 50 MG 24 hr tablet TAKE 1 TABLET DAILY. TAKE WITH OR IMMEDIATELY FOLLOWING A MEAL. Patient taking differently: TAKE 1 TABLET AT BEDTIME 10/15/15  Yes [name], MD   Physical Exam: Filed Vitals:   02/10/16 1038 02/10/16 1043 02/10/16 1045 02/10/16 1100  BP:  157/101 169/90 162/103  Pulse:  90 82 57  Temp:  98 F (36.7 C)    TempSrc:  Oral    Resp:  18 16 16   Height:  5\' 6"  (1.676 m)    Weight:  88.905 kg (196 lb)    SpO2: 97% 99% 97% 97%    Wt Readings from Last 3 Encounters:  02/10/16 88.905 kg (196 lb)  02/10/16 89.177 kg (196 lb 9.6 oz)  01/31/16 88.905 kg (196 lb)    General:  Appears calm and comfortable Eyes: PERRL, normal lids, irises & conjunctiva ENT: grossly normal hearing, lips & tongue Neck: no LAD, masses or thyromegaly Cardiovascular: Irregular, no m/r/g. No LE edema. Telemetry: Each fibrillation  Respiratory: CTA bilaterally, no w/r/r. Normal respiratory effort. Abdomen: soft, ntnd Skin: no rash or induration seen on limited exam Musculoskeletal: Pain with movement of left shoulder and turning head towards left. Psychiatric: grossly normal mood and affect, speech fluent and appropriate Neurologic: grossly non-focal.           Labs on Admission:  Basic Metabolic Panel:  Recent Labs Lab 02/10/16 1057  NA 141  K 4.2  CL 106  CO2 23  GLUCOSE 109*  BUN 15  CREATININE 0.90  CALCIUM 9.2   Liver Function Tests: No results for input(s): AST, ALT, ALKPHOS, BILITOT, PROT, ALBUMIN in the last 168 hours. No results for input(s): LIPASE, AMYLASE in the last 168 hours. No results for input(s): AMMONIA in the last 168 hours. CBC:  Recent Labs Lab 02/10/16 1057  WBC 7.2  HGB 13.5  HCT 42.1  MCV 88.4  PLT 241   Cardiac Enzymes: No results for input(s): CKTOTAL, CKMB, CKMBINDEX, TROPONINI in the last 168 hours.  BNP (last 3 results) No results for input(s): BNP  in the last 8760 hours.  ProBNP (last 3 results) No results for input(s): PROBNP in the last 8760 hours.  CBG: No results for input(s): GLUCAP in the last 168 hours.  Radiological Exams on Admission: Dg Chest 2 View  02/10/2016  CLINICAL DATA:  Chest pain radiating to left shoulder and left arm. EXAM: CHEST  2 VIEW COMPARISON:  11/04/2015 FINDINGS: Heart is upper limits normal in size. No confluent airspace opacities or effusions. No acute bony abnormality. IMPRESSION: No active cardiopulmonary disease. Electronically Signed   By: Rolm Baptise M.D.   On: 02/10/2016 11:45    EKG: Independently reviewed.Atrial fibrillation  Assessment/Plan Principal Problem:   Chest pressure Active Problems:   HTN (hypertension)   Hyperlipidemia   Peripheral vascular disease (HCC)   Tobacco use   PAF (paroxysmal atrial fibrillation) (HCC)   Chest pain   1. Chest pain. With underlying risk factors including peripheral vascular disease, hypertension and hyperlipidemia, will admit to telemetry for observation and cycle cardiac markers. Her last stress test was in 2015 and was a negative study at that time. We'll consult cardiology to see if repeat stress testing is needed. 2. Hyperlipidemia. Continue statin. 3. Hypertension. Continue outpatient  regimen 4. Atrial fibrillation. We'll continue on Toprol. May need dose adjustment. Her chads score is 3. Will start on intravenous heparin for now. Defer further long-term anticoagulation to cardiology. 5. Tobacco use. Counseled on the importance of tobacco cessation.  Consults: Cardiology  Code Status: full code DVT Prophylaxis: on full dose heparin Family Communication: discussed with patient Disposition Plan: discharge home, likely in AM  Time spent: 70mins  Kayla Hopkins [facility] Pager [phone]

## 2016-02-10 NOTE — Progress Notes (Signed)
Subjective:  This chart was scribed for [name] by [name], at [facility].  This patient was seen in room 2  and the patient's care was started at 9:40 AM.     Patient ID: Kayla Hopkins, female    DOB: [birth year], 55 y.o.   MRN: [id number] Chief Complaint  Patient presents with  . Shoulder Pain    x 1 day  left shoulder  . Neck Pain    x 1 day  . Chest Pain    started about 4am    HPI  HPI Comments: Kayla Hopkins is a 55 y.o. female who presents to the [facility] complaining of constant pounding/ tight chest pain/pressure (4/5) onset this morning . Patient had left shoulder pain/neck pain (9/10) yesterday (9pm last night) and initially associated it with holding her grandson in her arms but states it got worse as the night progressed when she went to bed.  While she was in bed, patients pain was radiating down to her shoulder and neck.  This morning she woke up with tightness in her chest and is unable to take a deep breath without pain.  She took a tylenol last night and used [place] but denies any relief. She denies any pain in her legs.   History of pulmonary artery disease and has had A-fib in the past.  Patient still smokes occasionally. (one time per month)  Patient has a history of high blood pressure and cholesterol.  She has had nitroglycerin in the past which has helped with her chest pain.    EMS was called: urgent.   Patient Active Problem List   Diagnosis Date Noted  . BPPV (benign paroxysmal positional vertigo) 11/19/2015  . History of smoking 10/28/2015  . 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 Intractable migraine with aura without status migrainosus 03/18/2015  . Medication overuse headache 03/18/2015  . Joint pain 01/24/2015  . SOB (shortness of breath) on exertion 01/24/2015  . Non-cardiac chest pain 03/09/2014  . Postlaminectomy syndrome, lumbar region 01/24/2013  .  Peripheral vascular disease ([place])   . HTN (hypertension) 06/26/2011  . Hyperlipidemia 06/26/2011   Past Medical History  Diagnosis Date  . PAF (paroxysmal atrial fibrillation) ([place])   . Hypertension   . Peripheral vascular disease ([place]) 06/11/2009    Bilateral common iliac kissing stents (8X24 mm) . 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 She underwent successful balloon angioplasty.  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   . Hyperlipidemia   . Tobacco abuse     stopped smoking, 2016  . Claudication ([place])        . S/P cardiac cath 2009    Normal  . Arthritis   . Depression   . Headache   . Anxiety   . Migraine     tried several medications-no relief  . Herpes     no outbreaks-has Valtrex to take PRN  . Shortness of breath dyspnea     patient states she has had lung test that came back normal   Past Surgical History  Procedure Laterality Date  . Cardiac catheterization  2009    with stents  . Lumbar disc surgery      spinal stenosis  . Knee arthroscopy Bilateral   . Abdominal angiogram  N/A 05/11/2012    Procedure: ABDOMINAL ANGIOGRAM;  Surgeon: [name], MD;  Location: [facility] CATH LAB;  Service: Cardiovascular;  Laterality: N/A;  . Abdominal aortagram N/A 10/19/2012    Procedure: ABDOMINAL Maxcine Ham;  Surgeon: [name], MD;  Location: [facility] CATH LAB;  Service: Cardiovascular;  Laterality: N/A;  . Abdominal aortagram N/A 05/10/2013    Procedure: ABDOMINAL Maxcine Ham;  Surgeon: [name], MD;  Location: [facility] CATH LAB;  Service: Cardiovascular;  Laterality: N/A;  . Robotic assisted total hysterectomy with salpingectomy Bilateral 09/23/2015    Procedure: ROBOTIC ASSISTED TOTAL HYSTERECTOMY BILATERALSALPINGECTOMY AND OOPHORECTOMY;  Surgeon: [name], MD;  Location: [facility];  Service: Gynecology;  Laterality:  Bilateral;  . Vulvectomy N/A 09/23/2015    Procedure: WIDE EXCISION VULVECTOMY  WLE of vulvar VIN 2;  Surgeon: [name], MD;  Location: [facility];  Service: Gynecology;  Laterality: N/A;   Allergies  Allergen Reactions  . Pradaxa [Dabigatran Etexilate Mesylate]     Gastritis   . Zocor [Simvastatin]     Leg pains    Prior to Admission medications   Medication Sig Start Date End Date Taking? Authorizing Provider  acetaminophen (TYLENOL) 325 MG tablet Take 2 tablets (650 mg total) by mouth every 6 (six) hours as needed for mild pain (or Fever >/= 101). 03/09/14  Yes [name], MD  aspirin EC 81 MG tablet Take 81 mg by mouth every morning.    Yes Historical Provider, MD  atorvastatin (LIPITOR) 40 MG tablet TAKE 1 TABLET EVERY DAY  AT  6PM 11/27/15  Yes [name], MD  esomeprazole (NEXIUM) 20 MG capsule Take 20 mg by mouth daily at 12 noon.   Yes Historical Provider, MD  fish oil-omega-3 fatty acids 1000 MG capsule Take 1 g by mouth every morning.    Yes Historical Provider, MD  metoprolol succinate (TOPROL-XL) 50 MG 24 hr tablet TAKE 1 TABLET DAILY. TAKE WITH OR IMMEDIATELY FOLLOWING A MEAL. 10/15/15  Yes [name], MD   Social History   Social History  . Marital Status: Single    Spouse Name: N/A  . Number of Children: 3  . Years of Education: N/A   Occupational History  . Not on file.   Social History Main Topics  . Smoking status: Former Smoker -- 0.25 packs/day for 40 years    Types: Cigarettes  . Smokeless tobacco: Never Used     Comment: electronic cigarette  . Alcohol Use: No  . Drug Use: No  . Sexual Activity:    Partners: Male   Other Topics Concern  . Not on file   Social History Narrative       Review of Systems  Constitutional: Negative for fever and chills.  Eyes: Negative for pain, redness and itching.  Respiratory: Positive for chest tightness. Negative for choking.   Cardiovascular: Positive for chest pain.  Gastrointestinal:  Negative for nausea and vomiting.  Musculoskeletal: Positive for myalgias. Negative for neck pain and neck stiffness.  Neurological: Negative for syncope and speech difficulty.       Objective:   Physical Exam Filed Vitals:   02/10/16 0930  BP: 168/102  Pulse: 97  Temp: 98.3 F (36.8 C)  TempSrc: Oral  Resp: 16  Height: 5\' 6"  (1.676 m)  Weight: 196 lb 9.6 oz (89.177 kg)  SpO2: 99%    CONSTITUTIONAL: Alert cooperative, not in any distress HEAD: Normocephalic/atraumatic EYES: EOMI/PERRL ENMT: Mucous membranes moist NECK: supple no meningeal signs  SPINE/BACK:entire spine nontender CV: questionable rub vs rhonchi on the left.  Chest: She is very tender over the left anterior chest wall LUNGS: Lungs are clear to auscultation bilaterally, no apparent distress ABDOMEN: mild left upper quadrant tenderness.  GU:no cva tenderness NEURO: Pt is awake/alert/appropriate, moves all extremitiesx4.  No facial droop.   EXTREMITIES: pulses normal/equal, full ROM SKIN: warm, color normal PSYCH: no abnormalities of mood noted, alert and oriented to situation      Assessment & Plan:  Patient had neck and shoulder discomfort last night which sounded musculoskeletal this morning she started having substernal pressure discomfort which was different than her other pain. She has a history of atrial fibrillation but last EKG was sinus rhythm. Patient placed on a monitor and be in atrial fibrillation. EKG did not show any acute changes. Patient given 4 baby aspirin and nitroglycerin. Chest pain was significantly improved with nitroglycerin. She has tenderness on examination over the left anterior chest wall and over the left periscapular area. EMS had been called and will transport to the hospital for their evaluation.I personally performed the services described in this documentation, which was scribed in my presence. The recorded information has been reviewed and is accurate.

## 2016-02-10 NOTE — Progress Notes (Signed)
ANTICOAGULATION CONSULT NOTE - Initial Consult  Pharmacy Consult for Heparin Indication: atrial fibrillation  Allergies  Allergen Reactions  . Pradaxa [Dabigatran Etexilate Mesylate]     Gastritis   . Zocor [Simvastatin]     Leg pains     Patient Measurements: Height: 5\' 6"  (167.6 cm) Weight: 196 lb (88.905 kg) IBW/kg (Calculated) : 59.3 Heparin Dosing Weight: 78.5  Vital Signs: Temp: 98 F (36.7 C) (04/03 1043) Temp Source: Oral (04/03 1043) BP: 137/86 mmHg (04/03 1500) Pulse Rate: 110 (04/03 1500)  Labs:  Recent Labs  02/10/16 1057 02/10/16 1325  HGB 13.5  --   HCT 42.1  --   PLT 241  --   CREATININE 0.90  --   TROPONINI  --  <0.03    Estimated Creatinine Clearance: 82.1 mL/min (by C-G formula based on Cr of 0.9).   Medical History: Past Medical History  Diagnosis Date  . PAF (paroxysmal atrial fibrillation) ([place])   . Hypertension   . Peripheral vascular disease ([place]) 06/11/2009    Bilateral common iliac kissing stents (8X24 mm) . 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 She underwent successful balloon angioplasty.  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   . Hyperlipidemia   . Tobacco abuse     stopped smoking, 2016  . Claudication ([place])        . S/P cardiac cath 2009    Normal  . Arthritis   . Depression   . Headache   . Anxiety   . Migraine     tried several medications-no relief  . Herpes     no outbreaks-has Valtrex to take PRN  . Shortness of breath dyspnea     patient states she has had lung test that came back normal    Assessment: 54-yo F presents with complaint of chest pain.  PMH includes peripheral vascular disease, hypertension, hyperlipidemia and tobacco use.  EKG revealed Atrial Fibrillation.  Pharmacy consulted to start heparin.    Baseline CBC within normal limits.   No oral anticoagulation prior to admission, only on daily aspirin 81 mg.  Goal of Therapy:  Heparin level 0.3-0.7 units/ml Monitor platelets by anticoagulation protocol: Yes   Plan:  1.  Give heparin 5000 unit bolus x 1 2.  Start heparin 1150 units/hour 3.  Obtain 6-hour heparin level @2200  4.  Obtain daily heparin level, CBC   Viann Fish 02/10/2016,3:12 PM

## 2016-02-11 ENCOUNTER — Observation Stay (HOSPITAL_COMMUNITY): Payer: Commercial Managed Care - HMO

## 2016-02-11 ENCOUNTER — Observation Stay (HOSPITAL_BASED_OUTPATIENT_CLINIC_OR_DEPARTMENT_OTHER): Payer: Commercial Managed Care - HMO

## 2016-02-11 ENCOUNTER — Encounter (HOSPITAL_COMMUNITY): Payer: Self-pay | Admitting: General Practice

## 2016-02-11 DIAGNOSIS — I480 Paroxysmal atrial fibrillation: Secondary | ICD-10-CM | POA: Diagnosis not present

## 2016-02-11 DIAGNOSIS — I739 Peripheral vascular disease, unspecified: Secondary | ICD-10-CM | POA: Diagnosis not present

## 2016-02-11 DIAGNOSIS — R079 Chest pain, unspecified: Secondary | ICD-10-CM

## 2016-02-11 DIAGNOSIS — E785 Hyperlipidemia, unspecified: Secondary | ICD-10-CM | POA: Diagnosis not present

## 2016-02-11 DIAGNOSIS — I10 Essential (primary) hypertension: Secondary | ICD-10-CM | POA: Diagnosis not present

## 2016-02-11 DIAGNOSIS — R51 Headache: Secondary | ICD-10-CM

## 2016-02-11 DIAGNOSIS — R0789 Other chest pain: Secondary | ICD-10-CM | POA: Diagnosis not present

## 2016-02-11 LAB — BASIC METABOLIC PANEL
Anion gap: 13 (ref 5–15)
BUN: 18 mg/dL (ref 6–20)
CHLORIDE: 104 mmol/L (ref 101–111)
CO2: 21 mmol/L — AB (ref 22–32)
Calcium: 9.2 mg/dL (ref 8.9–10.3)
Creatinine, Ser: 0.77 mg/dL (ref 0.44–1.00)
GFR calc Af Amer: >60 mL/min (ref >60)
GFR calc non Af Amer: >60 mL/min (ref >60)
Glucose, Bld: 104 mg/dL — ABNORMAL HIGH (ref 65–99)
POTASSIUM: 4.3 mmol/L (ref 3.5–5.1)
SODIUM: 138 mmol/L (ref 135–145)

## 2016-02-11 LAB — CBC
HCT: 41.7 % (ref 36.0–46.0)
HEMOGLOBIN: 13.2 g/dL (ref 12.0–15.0)
MCH: 28.3 pg (ref 26.0–34.0)
MCHC: 31.7 g/dL (ref 30.0–36.0)
MCV: 89.5 fL (ref 78.0–100.0)
Platelets: 231 10*3/uL (ref 150–400)
RBC: 4.66 MIL/uL (ref 3.87–5.11)
RDW: 13.9 % (ref 11.5–15.5)
WBC: 10 10*3/uL (ref 4.0–10.5)

## 2016-02-11 LAB — ECHOCARDIOGRAM COMPLETE
Height: 66 in
Weight: 3131.2 oz

## 2016-02-11 LAB — HEPARIN LEVEL (UNFRACTIONATED): Heparin Unfractionated: 0.33 IU/mL (ref 0.30–0.70)

## 2016-02-11 MED ORDER — MORPHINE SULFATE (PF) 4 MG/ML IV SOLN
INTRAVENOUS | Status: AC
  Administered 2016-02-11: 4 mg
  Filled 2016-02-11: qty 1

## 2016-02-11 MED ORDER — PROCHLORPERAZINE EDISYLATE 5 MG/ML IJ SOLN
5.0000 mg | Freq: Four times a day (QID) | INTRAMUSCULAR | Status: DC | PRN
  Administered 2016-02-11: 5 mg via INTRAVENOUS
  Filled 2016-02-11 (×2): qty 1

## 2016-02-11 MED ORDER — DILTIAZEM HCL 30 MG PO TABS
30.0000 mg | ORAL_TABLET | Freq: Four times a day (QID) | ORAL | Status: DC
  Administered 2016-02-11 (×2): 30 mg via ORAL
  Filled 2016-02-11 (×2): qty 1

## 2016-02-11 MED ORDER — DILTIAZEM HCL ER COATED BEADS 180 MG PO CP24
180.0000 mg | ORAL_CAPSULE | Freq: Every day | ORAL | Status: DC
  Administered 2016-02-11 – 2016-02-12 (×2): 180 mg via ORAL
  Filled 2016-02-11 (×2): qty 1

## 2016-02-11 MED ORDER — TECHNETIUM TC 99M SESTAMIBI GENERIC - CARDIOLITE
10.0000 | Freq: Once | INTRAVENOUS | Status: AC | PRN
  Administered 2016-02-11: 10 via INTRAVENOUS

## 2016-02-11 MED ORDER — BUTALBITAL-APAP-CAFFEINE 50-325-40 MG PO TABS
2.0000 | ORAL_TABLET | ORAL | Status: AC
  Administered 2016-02-11: 2 via ORAL
  Filled 2016-02-11: qty 2

## 2016-02-11 MED ORDER — REGADENOSON 0.4 MG/5ML IV SOLN
0.4000 mg | Freq: Once | INTRAVENOUS | Status: DC
  Filled 2016-02-11: qty 5

## 2016-02-11 MED ORDER — REGADENOSON 0.4 MG/5ML IV SOLN
INTRAVENOUS | Status: AC
  Filled 2016-02-11: qty 5

## 2016-02-11 NOTE — Progress Notes (Signed)
Patient Name: Kayla Hopkins Date of Encounter: 02/11/2016  Principal Problem:   Chest pressure Active Problems:   HTN (hypertension)   Hyperlipidemia   Peripheral vascular disease (HCC)   Tobacco use   PAF (paroxysmal atrial fibrillation) [facility])   Primary Cardiologist: Dr Fletcher Anon  Patient Profile: 55 yo female w/ PAD, HTN, PAF not anticoagulated w/ CHADS2VASC 3, HLD, tobacco use, admitted 04/03 w/ CP and afib.  SUBJECTIVE: C/O severe HA, hx migraines but has not had one this bad in many years. No more chest pain. Has N&V, pt feels is 2nd HA. SOB is intermittent.  OBJECTIVE Filed Vitals:   02/11/16 0221 02/11/16 0519 02/11/16 0754 02/11/16 0930  BP:  156/95 150/87 157/110  Pulse: 53 91 62   Temp:  97.9 F (36.6 C) 98.1 F (36.7 C)   TempSrc:  Oral Oral   Resp:  20 18   Height:      Weight:  195 lb 11.2 oz (88.769 kg)    SpO2:  95% 95%     Intake/Output Summary (Last 24 hours) at 02/11/16 0945 Last data filed at 02/11/16 0752  Gross per 24 hour  Intake      0 ml  Output    450 ml  Net   -450 ml   Filed Weights   02/10/16 1043 02/10/16 1709 02/11/16 0519  Weight: 196 lb (88.905 kg) 194 lb 1.6 oz (88.043 kg) 195 lb 11.2 oz (88.769 kg)    PHYSICAL EXAM General: Well developed, well nourished, female in no acute distress. Head: Normocephalic, atraumatic.  Neck: Supple without bruits, JVD not elevated. Lungs:  Resp regular and unlabored, CTA. Heart: Irreg R&R, S1, S2, no S3, S4, or murmur; no rub. Abdomen: Soft, non-tender, non-distended, BS + x 4.  Extremities: No clubbing, cyanosis, edema.  Neuro: Alert and oriented X 3. Moves all extremities spontaneously.  LABS: CBC:  Recent Labs  02/10/16 1057 02/11/16 0510  WBC 7.2 10.0  HGB 13.5 13.2  HCT 42.1 41.7  MCV 88.4 89.5  PLT 241 [id number]   Basic Metabolic Panel:  Recent Labs  02/10/16 1057 02/11/16 0510  NA 141 138  K 4.2 4.3  CL 106 104  CO2 23 21*  GLUCOSE 109* 104*  BUN 15 18  CREATININE  0.90 0.77  CALCIUM 9.2 9.2   Cardiac Enzymes:  Recent Labs  02/10/16 1325 02/10/16 1822  TROPONINI <0.03 <0.03    Recent Labs  02/10/16 1057  TROPIPOC 0.01   Thyroid Function Tests:  Recent Labs  02/10/16 1822  TSH 1.124   TELE: SR, seen in nuc med    ECG: ST, rate 108, no acute changes  Radiology/Studies: Dg Chest 2 View  02/10/2016  CLINICAL DATA:  Chest pain radiating to left shoulder and left arm. EXAM: CHEST  2 VIEW COMPARISON:  11/04/2015 FINDINGS: Heart is upper limits normal in size. No confluent airspace opacities or effusions. No acute bony abnormality. IMPRESSION: No active cardiopulmonary disease. Electronically Signed   By: Rolm Baptise M.D.   On: 02/10/2016 11:45     Current Medications:  . aspirin EC  81 mg Oral QHS  . atorvastatin  40 mg Oral q1800  . diltiazem  30 mg Oral 4 times per day  . metoprolol succinate  50 mg Oral QHS  . omega-3 acid ethyl esters  1 g Oral QHS  . pantoprazole  40 mg Oral Daily  . regadenoson      . regadenoson  0.4 mg  Intravenous Once   . heparin 1,150 Units/hr (02/11/16 [id number])    ASSESSMENT AND PLAN: Principal Problem:   Chest pressure - sx resolved, ez negative MI - do Lexi MV when other problems improved, do not feel she can tolerate today - she has had resting images, perhaps can do stress images in am if better. - MD advise on change to cardiac CT but pt is tachycardic - will reorder Lexiscan for AM  Active Problems:   HTN (hypertension) - BP is elevated, possibly 2nd pain - per IM    Hyperlipidemia - on home dose Lipitor    Peripheral vascular disease (HCC) - distal pulses intact    Tobacco use - cessation encouraged - per IM    PAF (paroxysmal atrial fibrillation) (HCC) - HR range 50s-120s, still in afib - rate generally better today, but feel elevated right now due to pain.    Severe HA - pt is on heparin - remote hx migraines but none recently and has not had one this bad in many years -  will ck head CT, d/c heparin - added Compazine since Zofran not helping nausea - otherwise, mgt per IM  Signed, Barrett, Rhonda , PA-C 9:45 AM 02/11/2016  The patient was seen, examined and discussed with Rosaria Ferries, PA-C and I agree with the above.   The patient couldn't tolerate the stress test sec to migraine, a-fib now with controlled ventricular rate, we will follow. Complete stress test tomorrow. BP elevated, I would switch cardizem to CD 180 mg po daily.  Dorothy Spark 02/11/2016

## 2016-02-11 NOTE — Progress Notes (Signed)
Pt's heart rate is down to 53 -56 (for 2-3 episodes below 40 as per CCMD), stopped Cardizem drip, informed to MD, pt is asymptomatic, covering with PRN Pain medicine and heat pack for her shoulder pain, other than that sleeping on and off, IV heparin is continue too, will continue to monitor the patient.

## 2016-02-11 NOTE — Progress Notes (Signed)
ANTICOAGULATION CONSULT NOTE - Follow Up Consult  Pharmacy Consult for Heparin Indication: atrial fibrillation  Allergies  Allergen Reactions  . Pradaxa [Dabigatran Etexilate Mesylate]     Gastritis   . Zocor [Simvastatin]     Leg pains     Patient Measurements: Height: 5\' 6"  (167.6 cm) Weight: 195 lb 11.2 oz (88.769 kg) IBW/kg (Calculated) : 59.3 Heparin Dosing Weight: 78.5kg  Vital Signs: Temp: 98.1 F (36.7 C) (04/04 0754) Temp Source: Oral (04/04 0754) BP: 150/87 mmHg (04/04 0754) Pulse Rate: 62 (04/04 0754)  Labs:  Recent Labs  02/10/16 1057 02/10/16 1325 02/10/16 1822 02/10/16 2137 02/11/16 0510  HGB 13.5  --   --   --  13.2  HCT 42.1  --   --   --  41.7  PLT 241  --   --   --  231  HEPARINUNFRC  --   --   --  0.64 0.33  CREATININE 0.90  --   --   --  0.77  TROPONINI  --  <0.03 <0.03  --   --     Estimated Creatinine Clearance: 92.3 mL/min (by C-G formula based on Cr of 0.77).   Medications:  Heparin @ 1150 units/hr  Assessment: 54yof continues on heparin for afib with plans for stress test today. Heparin level is therapeutic at 0.33. CBC stable. No bleeding. Noted plan to change to eliquis if stress test negative.  Goal of Therapy:  Heparin level 0.3-0.7 units/ml Monitor platelets by anticoagulation protocol: Yes   Plan:  1) Continue heparin at 1150 units/hr 2) Follow up after stress test  Deboraha Sprang 02/11/2016,8:14 AM

## 2016-02-11 NOTE — Progress Notes (Signed)
[facility] PROGRESS NOTE  Kayla Hopkins [id number] DOB: Sep 05, 1961 DOA: 02/10/2016 PCP: Hoyt Koch, MD Admit HPI / Brief Narrative: Kayla Hopkins is a 55 y.o. WF PMHx PVD, Tobacco Abuse, HTN, Paroxysmal Atrial Fibrillation HLD,   Presents to the hospital with complaints of chest pain. Patient reports that yesterday she was holding her grandson and her arms for an extended period of time. She developed pain in her left shoulder and neck which she felt was musculoskeletal. She went to bed and overnight she woke up with complaints of chest pressure which she describes as different from her shoulder pain. She describes this as a pressure type of discomfort, associated nausea, shortness of breath, palpitations. She called EMS and received nitroglycerin which she reports resolved her discomfort. She's not had any recurrence of discomfort. Due to the patient's risk factors, she's been referred for admission.    HPI/Subjective: 4/4 A/O 4, NAD. States previous DVT 2, bilateral lower extremity stents 4    Assessment/Plan: Chest pain.  -Troponin 2 negative  -Stress test aborted today secondary to headache. Currently headache free will complete stress test in a.m.  Hyperlipidemia. -Lipitor 40 mg daily  Hypertension.  -Cardizem 180 mg daily -Toprol 50 mg daily  Chronic atrial fibrillation chads score is 3 -See hypertension -Heparin drip - Defer further long-term anticoagulation to cardiology.  Tobacco abuse  - Counseled on the importance of tobacco cessation.   Code Status: Full Family Communication: None available Disposition Plan: Per cardiology   Consultants: Dr.Katarina Hazel Sams cardiology   Procedures: Stress test pending   Cultures NA  Antibiotics: NA  DVT prophylaxis Heparin drip    Objective: Filed Vitals:   02/11/16 0221 02/11/16 0519 02/11/16 0754 02/11/16 0930  BP:  156/95 150/87 157/110  Pulse: 53 91 62   Temp:  97.9 F  (36.6 C) 98.1 F (36.7 C)   TempSrc:  Oral Oral   Resp:  20 18   Height:      Weight:  88.769 kg (195 lb 11.2 oz)    SpO2:  95% 95%     Intake/Output Summary (Last 24 hours) at 02/11/16 1707 Last data filed at 02/11/16 1444  Gross per 24 hour  Intake    120 ml  Output   1100 ml  Net   -980 ml   Filed Weights   02/10/16 1043 02/10/16 1709 02/11/16 0519  Weight: 88.905 kg (196 lb) 88.043 kg (194 lb 1.6 oz) 88.769 kg (195 lb 11.2 oz)     Exam: General: A/O 4, NAD, resting comfortably in bed No acute respiratory distress Eyes: Negative headache, double vision,negative scleral hemorrhage ENT: Negative Runny nose, negative gingival bleeding, Neck:  Negative scars, masses, torticollis, lymphadenopathy, JVD Lungs: Clear to auscultation bilaterally without wheezes or crackles Cardiovascular: Irregular irregular rhythm and rate, negative murmur gallop or rub normal S1 and S2 Abdomen:negative abdominal pain, negative dysphagia, nondistended, positive soft, bowel sounds, no rebound, no ascites, no appreciable mass Extremities: No significant cyanosis, clubbing, or edema bilateral lower extremities Psychiatric:  Negative depression, negative anxiety, negative fatigue, negative mania  Neurologic:  Cranial nerves II through XII intact, tongue/uvula midline, all extremities muscle strength 5/5, sensation intact throughout, negative dysarthria, negative expressive aphasia, negative receptive aphasia.     Data Reviewed: Basic Metabolic Panel:  Recent Labs Lab 02/10/16 1057 02/11/16 0510  NA 141 138  K 4.2 4.3  CL 106 104  CO2 23 21*  GLUCOSE 109* 104*  BUN 15 18  CREATININE 0.90 0.77  CALCIUM 9.2 9.2   Liver Function Tests: No results for input(s): AST, ALT, ALKPHOS, BILITOT, PROT, ALBUMIN in the last 168 hours. No results for input(s): LIPASE, AMYLASE in the last 168 hours. No results for input(s): AMMONIA in the last 168 hours. CBC:  Recent Labs Lab 02/10/16 1057  02/11/16 0510  WBC 7.2 10.0  HGB 13.5 13.2  HCT 42.1 41.7  MCV 88.4 89.5  PLT 241 231   Cardiac Enzymes:  Recent Labs Lab 02/10/16 1325 02/10/16 1822  TROPONINI <0.03 <0.03   BNP (last 3 results) No results for input(s): BNP in the last 8760 hours.  ProBNP (last 3 results) No results for input(s): PROBNP in the last 8760 hours.  CBG: No results for input(s): GLUCAP in the last 168 hours.  No results found for this or any previous visit (from the past 240 hour(s)).   Studies: Dg Chest 2 View  02/10/2016  CLINICAL DATA:  Chest pain radiating to left shoulder and left arm. EXAM: CHEST  2 VIEW COMPARISON:  11/04/2015 FINDINGS: Heart is upper limits normal in size. No confluent airspace opacities or effusions. No acute bony abnormality. IMPRESSION: No active cardiopulmonary disease. Electronically Signed   By: Rolm Baptise M.D.   On: 02/10/2016 11:45   Ct Head Wo Contrast  02/11/2016  CLINICAL DATA:  Headache, primarily right periorbital EXAM: CT HEAD WITHOUT CONTRAST TECHNIQUE: Contiguous axial images were obtained from the base of the skull through the vertex without intravenous contrast. COMPARISON:  March 08, 2014 FINDINGS: The ventricles are normal in size and configuration. There is slight frontal atrophy bilaterally, stable. There is no intracranial mass, hemorrhage, extra-axial fluid collection, or midline shift. Gray-white compartments are normal. No acute infarct evident. The bony calvarium appears intact. The mastoid air cells are clear. There are no intraorbital lesions. There is opacification of multiple ethmoid air cells bilaterally. There is diffuse opacification of the right frontal sinus. There is nasal turbinate edema with obstruction of the nares bilaterally. IMPRESSION: Slight frontal atrophy bilaterally. Ventricles are normal in size and configuration. There is no mass, hemorrhage, or focal gray -white compartment lesion. There is multifocal paranasal sinus disease.  Diffuse nasal turbinate edema is present with obstruction of both nares. A degree of underlying nasal polyposis is questioned. Electronically Signed   By: Lowella Grip III M.D.   On: 02/11/2016 10:09    Scheduled Meds: . aspirin EC  81 mg Oral QHS  . atorvastatin  40 mg Oral q1800  . diltiazem  180 mg Oral Daily  . metoprolol succinate  50 mg Oral QHS  . omega-3 acid ethyl esters  1 g Oral QHS  . pantoprazole  40 mg Oral Daily  . regadenoson      . regadenoson  0.4 mg Intravenous Once   Continuous Infusions: . heparin Stopped (02/11/16 1008)    Principal Problem:   Chest pressure Active Problems:   HTN (hypertension)   Hyperlipidemia   Peripheral vascular disease (HCC)   Tobacco use   PAF (paroxysmal atrial fibrillation) ([place])    Time spent: 30 minutes    WOOD[place], [facility] Pager [phone]. If 7PM-7AM, please contact night-coverage at [email or web address], password [facility] 02/11/2016, 5:07 PM     Care during the described time interval was provided by me .  I have reviewed this patient's available data, including medical history, events of note, physical examination, and all test results as part of my evaluation. I have personally reviewed and interpreted all radiology studies.  Curtis Woods, MD [phone] Pager    

## 2016-02-11 NOTE — Progress Notes (Signed)
  Echocardiogram 2D Echocardiogram has been performed.  Kayla Hopkins 02/11/2016, 3:09 PM

## 2016-02-12 DIAGNOSIS — I480 Paroxysmal atrial fibrillation: Secondary | ICD-10-CM | POA: Diagnosis not present

## 2016-02-12 DIAGNOSIS — R079 Chest pain, unspecified: Secondary | ICD-10-CM | POA: Diagnosis not present

## 2016-02-12 DIAGNOSIS — R0602 Shortness of breath: Secondary | ICD-10-CM | POA: Diagnosis not present

## 2016-02-12 DIAGNOSIS — Z720 Tobacco use: Secondary | ICD-10-CM | POA: Diagnosis not present

## 2016-02-12 DIAGNOSIS — E785 Hyperlipidemia, unspecified: Secondary | ICD-10-CM | POA: Diagnosis not present

## 2016-02-12 DIAGNOSIS — I10 Essential (primary) hypertension: Secondary | ICD-10-CM | POA: Diagnosis not present

## 2016-02-12 DIAGNOSIS — I739 Peripheral vascular disease, unspecified: Secondary | ICD-10-CM | POA: Diagnosis not present

## 2016-02-12 DIAGNOSIS — R0789 Other chest pain: Secondary | ICD-10-CM | POA: Diagnosis not present

## 2016-02-12 LAB — BASIC METABOLIC PANEL
ANION GAP: 11 (ref 5–15)
BUN: 18 mg/dL (ref 6–20)
CALCIUM: 9 mg/dL (ref 8.9–10.3)
CO2: 25 mmol/L (ref 22–32)
Chloride: 104 mmol/L (ref 101–111)
Creatinine, Ser: 0.91 mg/dL (ref 0.44–1.00)
GFR calc Af Amer: >60 mL/min (ref >60)
GLUCOSE: 118 mg/dL — AB (ref 65–99)
POTASSIUM: 3.5 mmol/L (ref 3.5–5.1)
SODIUM: 140 mmol/L (ref 135–145)

## 2016-02-12 LAB — CBC
HCT: 40.1 % (ref 36.0–46.0)
HEMOGLOBIN: 12.5 g/dL (ref 12.0–15.0)
MCH: 28 pg (ref 26.0–34.0)
MCHC: 31.2 g/dL (ref 30.0–36.0)
MCV: 89.7 fL (ref 78.0–100.0)
PLATELETS: 213 10*3/uL (ref 150–400)
RBC: 4.47 MIL/uL (ref 3.87–5.11)
RDW: 13.7 % (ref 11.5–15.5)
WBC: 8.2 10*3/uL (ref 4.0–10.5)

## 2016-02-12 LAB — NM MYOCAR MULTI W/SPECT W/WALL MOTION / EF
CSEPEW: 1 METS
CSEPHR: 60 %
Exercise duration (min): 5 min
MPHR: 168 {beats}/min
Peak HR: 101 {beats}/min
Rest HR: 72 {beats}/min

## 2016-02-12 LAB — MAGNESIUM: MAGNESIUM: 2 mg/dL (ref 1.7–2.4)

## 2016-02-12 LAB — HEPARIN LEVEL (UNFRACTIONATED)

## 2016-02-12 MED ORDER — POTASSIUM CHLORIDE CRYS ER 20 MEQ PO TBCR: 40.0000 meq | EXTENDED_RELEASE_TABLET | Freq: Once | ORAL | Status: DC

## 2016-02-12 MED ORDER — REGADENOSON 0.4 MG/5ML IV SOLN
0.4000 mg | Freq: Once | INTRAVENOUS | Status: AC
  Administered 2016-02-12: 0.4 mg via INTRAVENOUS
  Filled 2016-02-12: qty 5

## 2016-02-12 MED ORDER — POTASSIUM CHLORIDE CRYS ER 20 MEQ PO TBCR
40.0000 meq | EXTENDED_RELEASE_TABLET | Freq: Once | ORAL | Status: AC
  Administered 2016-02-12: 40 meq via ORAL
  Filled 2016-02-12: qty 2

## 2016-02-12 MED ORDER — DILTIAZEM HCL ER COATED BEADS 180 MG PO CP24: 180.0000 mg | ORAL_CAPSULE | Freq: Every day | ORAL | Status: DC

## 2016-02-12 MED ORDER — REGADENOSON 0.4 MG/5ML IV SOLN
INTRAVENOUS | Status: AC
  Administered 2016-02-12: 09:00:00
  Filled 2016-02-12: qty 5

## 2016-02-12 MED ORDER — APIXABAN 5 MG PO TABS: 5.0000 mg | ORAL_TABLET | Freq: Two times a day (BID) | ORAL | Status: DC

## 2016-02-12 MED ORDER — CYCLOBENZAPRINE HCL 5 MG PO TABS: 5.0000 mg | ORAL_TABLET | Freq: Three times a day (TID) | ORAL | Status: DC | PRN

## 2016-02-12 MED ORDER — TECHNETIUM TC 99M SESTAMIBI GENERIC - CARDIOLITE
30.0000 | Freq: Once | INTRAVENOUS | Status: AC | PRN
Start: 2016-02-12 — End: 2016-02-12
  Administered 2016-02-12: 30 via INTRAVENOUS

## 2016-02-12 NOTE — Progress Notes (Signed)
Discussed with Dr. Meda Coffee, given the fact that she is currently in afib, will change ASA to eliquis. Patient denies any bleeding issue, no h/o blood transfusion or hemorrhagic stroke.   Hilbert Corrigan PA Pager: [phone]

## 2016-02-12 NOTE — Progress Notes (Signed)
Patient Profile: 55 yo female w/ PAD, HTN, PAF not anticoagulated w/ CHADS2VASC 3, HLD, tobacco use, admitted 04/03 w/ CP and afib.  Subjective: Only complaint today is a HA. She had mild nausea and dyspnea during stress test but symptoms resolved post stress.   Objective: Vital signs in last 24 hours: Temp:  [97.5 F (36.4 C)-97.9 F (36.6 C)] 97.5 F (36.4 C) (04/05 0602) Pulse Rate:  [74] 74 (04/05 0602) Resp:  [16] 16 (04/05 0602) BP: (112-157)/(64-110) 123/72 mmHg (04/05 0900) SpO2:  [96 %-97 %] 96 % (04/05 0602) Weight:  [192 lb 12.8 oz (87.454 kg)] 192 lb 12.8 oz (87.454 kg) (04/05 0602) Last BM Date: 02/06/16  Intake/Output from previous day: 04/04 0701 - 04/05 0700 In: 120 [P.O.:120] Out: 2150 [Urine:2150] Intake/Output this shift:    Medications Current Facility-Administered Medications  Medication Dose Route Frequency Provider Last Rate Last Dose  . acetaminophen (TYLENOL) tablet 650 mg  650 mg Oral Q4H PRN Kathie Dike, MD   650 mg at 02/11/16 0800  . aspirin EC tablet 81 mg  81 mg Oral QHS Kathie Dike, MD   81 mg at 02/11/16 2225  . atorvastatin (LIPITOR) tablet 40 mg  40 mg Oral q1800 Kathie Dike, MD   40 mg at 02/11/16 1743  . diltiazem (CARDIZEM CD) 24 hr capsule 180 mg  180 mg Oral Daily Dorothy Spark, MD   180 mg at 02/11/16 1743  . gi cocktail (Maalox,Lidocaine,Donnatal)  30 mL Oral QID PRN Kathie Dike, MD   30 mL at 02/10/16 1247  . heparin ADULT infusion 100 units/mL (25000 units/250 mL)  1,150 Units/hr Intravenous Continuous Valeda Malm Rumbarger, RPH   Stopped at 02/11/16 1008  . hydrALAZINE (APRESOLINE) injection 10 mg  10 mg Intravenous Q6H PRN Kathie Dike, MD   10 mg at 02/10/16 1330  . methocarbamol (ROBAXIN) 500 mg in dextrose 5 % 50 mL IVPB  500 mg Intravenous Q6H PRN Kathie Dike, MD 110 mL/hr at 02/10/16 1650 500 mg at 02/10/16 1650  . metoprolol succinate (TOPROL-XL) 24 hr tablet 50 mg  50 mg Oral QHS Kathie Dike, MD   50  mg at 02/11/16 2225  . morphine 2 MG/ML injection 2 mg  2 mg Intravenous Q2H PRN Kathie Dike, MD   2 mg at 02/11/16 0139  . omega-3 acid ethyl esters (LOVAZA) capsule 1 g  1 g Oral QHS Kathie Dike, MD   1 g at 02/11/16 2225  . ondansetron (ZOFRAN) injection 4 mg  4 mg Intravenous Q6H PRN Kathie Dike, MD   4 mg at 02/11/16 0532  . oxyCODONE-acetaminophen (PERCOCET/ROXICET) 5-325 MG per tablet 1-2 tablet  1-2 tablet Oral Q4H PRN Kathie Dike, MD   2 tablet at 02/10/16 1929  . pantoprazole (PROTONIX) EC tablet 40 mg  40 mg Oral Daily Kathie Dike, MD   40 mg at 02/10/16 1739  . prochlorperazine (COMPAZINE) injection 5 mg  5 mg Intravenous Q6H PRN Rhonda G Barrett, PA-C   5 mg at 02/11/16 1012  . regadenoson (LEXISCAN) 0.4 MG/5ML injection SOLN           . regadenoson (LEXISCAN) injection SOLN 0.4 mg  0.4 mg Intravenous Once Isaiah Serge, NP   0.4 mg at 02/11/16 0930    PE: General appearance: alert, cooperative and no distress Neck: no carotid bruit and no JVD Lungs: clear to auscultation bilaterally Heart: irregularly irregular rhythm and regular rate Extremities: no LEE Pulses: 2+ and symmetric Skin: warm  and dry Neurologic: Grossly normal  Lab Results:   Recent Labs  02/10/16 1057 02/11/16 0510 02/12/16 0236  WBC 7.2 10.0 8.2  HGB 13.5 13.2 12.5  HCT 42.1 41.7 40.1  PLT 241 231 213   BMET  Recent Labs  02/10/16 1057 02/11/16 0510 02/12/16 0236  NA 141 138 140  K 4.2 4.3 3.5  CL 106 104 104  CO2 23 21* 25  GLUCOSE 109* 104* 118*  BUN 15 18 18   CREATININE 0.90 0.77 0.91  CALCIUM 9.2 9.2 9.0   Cardiac Panel (last 3 results)  Recent Labs  02/10/16 1325 02/10/16 1822  TROPONINI <0.03 <0.03    Studies/Results: NST- results pending   Assessment/Plan    Principal Problem:   Chest pressure Active Problems:   HTN (hypertension)   Hyperlipidemia   Peripheral vascular disease (HCC)   Tobacco use   PAF (paroxysmal atrial fibrillation)  ([place])   1. Chest Pain: cardiac enzymes are negative. NST completed today to assess for ischemia. Results pending. Will need cath if high risk. We will follow up with results.    2. Atrial Fibrillation: EF and TSH normal. Ventricular rate is well controlled. Continue Cardizem and metoprolol. If NST is low risk and no indication for cath, will need to consider adding a NOAC prior to discharge for stroke prophylaxis.   3. Recurrent HAs: h/o migraine HAs. Head CT w/o intracranial abnormalities. There is multifocal paranasal sinus disease. Diffuse nasal turbinate edema is present with obstruction of both nares. ? Sinus HA. IM to manage.   4. HTN: well controlled on current regimen.  5. HLD: on statin therapy with Lipitor.   6. Tobacco Use: smoking cessation advised.   Brittainy M. Ladoris Gene 02/12/2016 9:14 AM  The patient was seen, examined and discussed with Brittainy M. Rosita Fire, PA-C and I agree with the above.   The patient couldn't tolerate the stress test sec to migraine yesterday, a-fib now with controlled ventricular rate. Completed stress test this am, if negative she can be discharged home. BP elevated, BP is controlled after switching cardizem to CD 180 mg po daily.  Dorothy Spark 02/12/2016

## 2016-02-12 NOTE — Progress Notes (Signed)
Eliquis coupon card given to patient with instruction of usage/ activate card prior to usage. Mindi Slicker [facility] [phone]

## 2016-02-12 NOTE — Discharge Summary (Signed)
Discharge Summary  Kayla Hopkins [id number] DOB: 1961/04/08  PCP: Hoyt Koch, MD  Admit date: 02/10/2016 Discharge date: 02/12/2016  Time spent: <17mins  Recommendations for Outpatient Follow-up:  1. F/u with PMD within a week  for hospital discharge follow up, repeat cbc/bmp at follow up 2. F/u with cardiology for afib 3. New meds: cardizem , eilquis for afib, prn flexeril for left shoulder pain  Discharge Diagnoses:  Active Hospital Problems   Diagnosis Date Noted  . Chest pressure 02/10/2016  . Tobacco use 02/10/2016  . PAF (paroxysmal atrial fibrillation) ([place]) 02/10/2016  . Peripheral vascular disease ([place])   . HTN (hypertension) 06/26/2011  . Hyperlipidemia 06/26/2011    Resolved Hospital Problems   Diagnosis Date Noted Date Resolved  No resolved problems to display.    Discharge Condition: stable  Diet recommendation: heart healthy  Filed Weights   02/10/16 1709 02/11/16 0519 02/12/16 0602  Weight: 88.043 kg (194 lb 1.6 oz) 88.769 kg (195 lb 11.2 oz) 87.454 kg (192 lb 12.8 oz)    History of present illness:  Kayla Hopkins is a 55 y.o. female with history of peripheral vascular disease, tobacco use, hypertension and hyperlipidemia presents to the hospital with complaints of chest pain. Patient reports that yesterday she was holding her grandson and her arms for an extended period of time. She developed pain in her left shoulder and neck which she felt was musculoskeletal. She went to bed and overnight she woke up with complaints of chest pressure which she describes as different from her shoulder pain. She describes this as a pressure type of discomfort, associated nausea, shortness of breath, palpitations. She called EMS and received nitroglycerin which she reports resolved her discomfort. She's not had any recurrence of discomfort. Due to the patient's risk factors, she's been referred for admission.  Hospital Course:  Principal Problem:   Chest  pressure Active Problems:   HTN (hypertension)   Hyperlipidemia   Peripheral vascular disease (HCC)   Tobacco use   PAF (paroxysmal atrial fibrillation) ([place])  1. Chest pain. With underlying risk factors including peripheral vascular disease, hypertension and hyperlipidemia, she was admitted to telemetry for observation, cardiac markers negative x3. Her last stress test was in 2015 and was a negative study at that time. Repeat stress test on 4/5 , low risk, patient is cleared to discharge home with cardiology follow up. 2. Hyperlipidemia. Continue statin. 3. Hypertension. Continue metoprolol, cardizem added from this admission 4. Atrial fibrillation. on Toprol. cardizem added from this admission. Her chads score is 3. She recieved intravenous heparin in the hospital, cardiology recommended NOAC, apixaban.   5. Tobacco use. Counseled on the importance of tobacco cessation. 6. Left shoulder pain, x ray no acute bony abnormalities, prn flexeril, close follow up with pmd  Consults: Cardiology  Code Status: full code DVT Prophylaxis: on full dose heparin, started NOAC at discharge Family Communication: discussed with patient Disposition Plan: discharge home Procedures:  cardiac stress test  Consultations:  cardiology  Discharge Exam: BP 126/52 mmHg  Pulse 50  Temp(Src) 98.3 F (36.8 C) (Oral)  Resp 20  Ht 5\' 6"  (1.676 m)  Wt 87.454 kg (192 lb 12.8 oz)  BMI 31.13 kg/m2  SpO2 97%  LMP 11/09/2008  General: aaox3 Cardiovascular: iRRR Respiratory: CTABL  Discharge Instructions You were cared for by a hospitalist during your hospital stay. If you have any questions about your discharge medications or the care you received while you were in the hospital after you are  discharged, you can call the unit and asked to speak with the hospitalist on call if the hospitalist that took care of you is not available. Once you are discharged, your primary care physician will handle any further  medical issues. Please note that NO REFILLS for any discharge medications will be authorized once you are discharged, as it is imperative that you return to your primary care physician (or establish a relationship with a primary care physician if you do not have one) for your aftercare needs so that they can reassess your need for medications and monitor your lab values.      Discharge Instructions    Diet - low sodium heart healthy    Complete by:  As directed      Increase activity slowly    Complete by:  As directed             Medication List    STOP taking these medications        aspirin EC 81 MG tablet      TAKE these medications        acetaminophen 325 MG tablet  Commonly known as:  TYLENOL  Take 2 tablets (650 mg total) by mouth every 6 (six) hours as needed for mild pain (or Fever >/= 101).     apixaban 5 MG Tabs tablet  Commonly known as:  ELIQUIS  Take 1 tablet (5 mg total) by mouth 2 (two) times daily.     atorvastatin 40 MG tablet  Commonly known as:  LIPITOR  TAKE 1 TABLET EVERY DAY  AT  6PM     cyclobenzaprine 5 MG tablet  Commonly known as:  FLEXERIL  Take 1 tablet (5 mg total) by mouth 3 (three) times daily as needed for muscle spasms.     diltiazem 180 MG 24 hr capsule  Commonly known as:  CARDIZEM CD  Take 1 capsule (180 mg total) by mouth daily.     esomeprazole 20 MG capsule  Commonly known as:  NEXIUM  Take 20 mg by mouth at bedtime.     fish oil-omega-3 fatty acids 1000 MG capsule  Take 1 g by mouth at bedtime.     metoprolol succinate 50 MG 24 hr tablet  Commonly known as:  TOPROL-XL  TAKE 1 TABLET DAILY. TAKE WITH OR IMMEDIATELY FOLLOWING A MEAL.       Allergies  Allergen Reactions  . Pradaxa [Dabigatran Etexilate Mesylate]     Gastritis   . Zocor [Simvastatin]     Leg pains    Follow-up Information    Follow up with Hoyt Koch, MD.   Specialty:  Internal Medicine   Contact information:   [place] [id number] [phone]       Follow up with [facility] On 02/25/2016.   Specialty:  Cardiology   Why:  Followup with Dr. Fletcher Anon at [facility] at 9:15AM. (beaware of location, this followup is not at [facility])   Contact information:   [place] [phone]       The results of significant diagnostics from this hospitalization (including imaging, microbiology, ancillary and laboratory) are listed below for reference.    Significant Diagnostic Studies: Dg Chest 2 View  02/10/2016  CLINICAL DATA:  Chest pain radiating to left shoulder and left arm. EXAM: CHEST  2 VIEW COMPARISON:  11/04/2015 FINDINGS: Heart is upper limits normal in size. No confluent airspace opacities or  effusions. No acute bony abnormality. IMPRESSION: No active cardiopulmonary disease. Electronically Signed   By: Rolm Baptise M.D.   On: 02/10/2016 11:45   Ct Head Wo Contrast  02/11/2016  CLINICAL DATA:  Headache, primarily right periorbital EXAM: CT HEAD WITHOUT CONTRAST TECHNIQUE: Contiguous axial images were obtained from the base of the skull through the vertex without intravenous contrast. COMPARISON:  March 08, 2014 FINDINGS: The ventricles are normal in size and configuration. There is slight frontal atrophy bilaterally, stable. There is no intracranial mass, hemorrhage, extra-axial fluid collection, or midline shift. Gray-white compartments are normal. No acute infarct evident. The bony calvarium appears intact. The mastoid air cells are clear. There are no intraorbital lesions. There is opacification of multiple ethmoid air cells bilaterally. There is diffuse opacification of the right frontal sinus. There is nasal turbinate edema with obstruction of the nares bilaterally. IMPRESSION: Slight frontal atrophy bilaterally. Ventricles are normal in size and configuration. There is no mass, hemorrhage, or focal gray -white compartment  lesion. There is multifocal paranasal sinus disease. Diffuse nasal turbinate edema is present with obstruction of both nares. A degree of underlying nasal polyposis is questioned. Electronically Signed   By: Lowella Grip III M.D.   On: 02/11/2016 10:09   Nm Myocar Multi W/spect W/wall Motion / Ef  02/12/2016  CLINICAL DATA:  Chest pain, shortness of breath x1 day EXAM: MYOCARDIAL IMAGING WITH SPECT (REST AND PHARMACOLOGIC-STRESS) GATED LEFT VENTRICULAR WALL MOTION STUDY LEFT VENTRICULAR EJECTION FRACTION TECHNIQUE: Standard myocardial SPECT imaging was performed after resting intravenous injection of 10 mCi Tc-49m sestamibi. Subsequently, intravenous infusion of Lexiscan was performed under the supervision of the Cardiology staff. At peak effect of the drug, 30 mCi Tc-75m sestamibi was injected intravenously and standard myocardial SPECT imaging was performed. Quantitative gated imaging was also performed to evaluate left ventricular wall motion, and estimate left ventricular ejection fraction. COMPARISON:  03/09/2014 FINDINGS: Perfusion: No decreased activity in the left ventricle on stress imaging to suggest reversible ischemia or infarction. Mild anteroapical attenuation as before. Wall Motion: Mild septal hypokinesis. Otherwise Normal left ventricular wall motion. No left ventricular dilation. Left Ventricular Ejection Fraction: 60 % End diastolic volume 54 ml End systolic volume 22 ml IMPRESSION: 1. No reversible ischemia or infarction. 2. Mild septal hypokinesis. 3. Left ventricular ejection fraction 60% 4. Low-risk stress test findings*. *2012 Appropriate Use Criteria for Coronary Revascularization Focused Update: J Am Coll Cardiol. [id number]. http://content.airportbarriers.com.aspx?articleid=[email or web address] Electronically Signed   By: Lucrezia Europe M.D.   On: 02/12/2016 11:41    Microbiology: No results found for this or any previous visit (from the past 240 hour(s)).   Labs: Basic Metabolic  Panel:  Recent Labs Lab 02/10/16 1057 02/11/16 0510 02/12/16 0236  NA 141 138 140  K 4.2 4.3 3.5  CL 106 104 104  CO2 23 21* 25  GLUCOSE 109* 104* 118*  BUN 15 18 18   CREATININE 0.90 0.77 0.91  CALCIUM 9.2 9.2 9.0  MG  --   --  2.0   Liver Function Tests: No results for input(s): AST, ALT, ALKPHOS, BILITOT, PROT, ALBUMIN in the last 168 hours. No results for input(s): LIPASE, AMYLASE in the last 168 hours. No results for input(s): AMMONIA in the last 168 hours. CBC:  Recent Labs Lab 02/10/16 1057 02/11/16 0510 02/12/16 0236  WBC 7.2 10.0 8.2  HGB 13.5 13.2 12.5  HCT 42.1 41.7 40.1  MCV 88.4 89.5 89.7  PLT 241 231 213   Cardiac Enzymes:  Recent Labs Lab 02/10/16  1325 02/10/16 1822  TROPONINI <0.03 <0.03   BNP: BNP (last 3 results) No results for input(s): BNP in the last 8760 hours.  ProBNP (last 3 results) No results for input(s): PROBNP in the last 8760 hours.  CBG: No results for input(s): GLUCAP in the last 168 hours.     SignedFlorencia Reasons MD, PhD  [facility] 02/12/2016, 2:33 PM

## 2016-02-12 NOTE — Care Management Obs Status (Signed)
[place] NOTIFICATION   Patient Details  Name: SEDONA MATSON MRN: [id number] Date of Birth: Mar 24, 1961   Medicare Observation Status Notification Given:  Yes    Royston Bake, RN 02/12/2016, 11:58 AM

## 2016-02-12 NOTE — Progress Notes (Signed)
Pt has orders to be discharged. Discharge instructions given and pt has no additional questions at this time. Medication regimen reviewed and pt educated. Pt verbalized understanding and has no additional questions. Telemetry box removed. IV removed and site in good condition. Pt stable and waiting for transportation.   Oyindamola Key RN 

## 2016-02-12 NOTE — Progress Notes (Signed)
ANTICOAGULATION CONSULT NOTE - Follow Up Consult  Pharmacy Consult for Heparin Indication: atrial fibrillation and chest pain  Allergies  Allergen Reactions  . Pradaxa [Dabigatran Etexilate Mesylate]     Gastritis   . Zocor [Simvastatin]     Leg pains     Patient Measurements: Height: 5\' 6"  (167.6 cm) Weight: 192 lb 12.8 oz (87.454 kg) (scale b) IBW/kg (Calculated) : 59.3 Heparin Dosing Weight:   Vital Signs: Temp: 97.5 F (36.4 C) (04/05 0602) Temp Source: Oral (04/05 0602) BP: 123/72 mmHg (04/05 0900) Pulse Rate: 74 (04/05 0602)  Labs:  Recent Labs  02/10/16 1057 02/10/16 1325 02/10/16 1822 02/10/16 2137 02/11/16 0510 02/12/16 0236  HGB 13.5  --   --   --  13.2 12.5  HCT 42.1  --   --   --  41.7 40.1  PLT 241  --   --   --  231 213  HEPARINUNFRC  --   --   --  0.64 0.33 <0.10*  CREATININE 0.90  --   --   --  0.77 0.91  TROPONINI  --  <0.03 <0.03  --   --   --     Estimated Creatinine Clearance: 80.6 mL/min (by C-G formula based on Cr of 0.91).   Medications:  Scheduled:  . aspirin EC  81 mg Oral QHS  . atorvastatin  40 mg Oral q1800  . diltiazem  180 mg Oral Daily  . metoprolol succinate  50 mg Oral QHS  . omega-3 acid ethyl esters  1 g Oral QHS  . pantoprazole  40 mg Oral Daily  . regadenoson      . regadenoson  0.4 mg Intravenous Once    Assessment: 55yo female with AFib and chest pain, no anticoag pta.  Heparin was stopped 4/4 due to severe headache with CT checked that was negative for bleed, but not resumed afterward.  Therefore HL this AM is subtherapeutic.  Hg and pltc wnl; last HL 0.33 on 1100 units/hr.   K 3.5 and Mg 2 (goal K >= 4 and Mg >= 2 in AFib)- d/w PA regarding supplementation and whether to resume Heparin.  Pt currently in stress test, may change to DOAC afterward if found to be low risk  Goal of Therapy:  Heparin level 0.3-0.7 units/ml Monitor platelets by anticoagulation protocol: Yes   Plan:  Resume heparin at 1200 units/hr,  to start after stress test Check HL 8hr  KCl 68mEq po X 1 Continue daily HL, CBC  Gracy Bruins, PharmD Clinical Pharmacist [facility]

## 2016-02-17 ENCOUNTER — Telehealth: Payer: Self-pay | Admitting: *Deleted

## 2016-02-17 DIAGNOSIS — N900 Mild vulvar dysplasia: Secondary | ICD-10-CM

## 2016-02-17 NOTE — Telephone Encounter (Signed)
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  Nothing else needs to be done.  She needs repeat vulvar colposcopy with AEX in six months.  Pt was hospitalized for a fib and anticoagulation needs and I've held pathology until she was discharged home.  Thanks.  Please make sure AEX and colpo of vulva is scheduled.  Thanks.

## 2016-02-17 NOTE — Telephone Encounter (Signed)
Call to patient, left message to call back. Can speak to any triage nurse. 

## 2016-02-20 ENCOUNTER — Inpatient Hospital Stay: Payer: Commercial Managed Care - HMO | Admitting: Family

## 2016-02-25 ENCOUNTER — Ambulatory Visit (INDEPENDENT_AMBULATORY_CARE_PROVIDER_SITE_OTHER): Payer: Commercial Managed Care - HMO | Admitting: Cardiovascular Disease

## 2016-02-25 ENCOUNTER — Encounter: Payer: Self-pay | Admitting: Cardiovascular Disease

## 2016-02-25 VITALS — BP 151/80 | HR 72 | Ht 66.0 in | Wt 196.6 lb

## 2016-02-25 DIAGNOSIS — I480 Paroxysmal atrial fibrillation: Secondary | ICD-10-CM

## 2016-02-25 MED ORDER — DILTIAZEM HCL ER COATED BEADS 180 MG PO CP24: 180.0000 mg | ORAL_CAPSULE | Freq: Every day | ORAL | Status: DC

## 2016-02-25 MED ORDER — APIXABAN 5 MG PO TABS: 5.0000 mg | ORAL_TABLET | Freq: Two times a day (BID) | ORAL | Status: DC

## 2016-02-25 NOTE — Progress Notes (Signed)
Cardiology Office Note   Date:  02/25/2016   ID:  Kayla Hopkins, DOB 03-Mar-1961, MRN [id number]  PCP:  Hoyt Koch, MD  Cardiologist:   Kathlyn Sacramento, MD   Chief Complaint  Patient presents with  . Follow-up    2 week post ED  pt states AFIB is constant--does not feel it today; no other Sx.--no Chest pain since ED      History of Present Illness: Kayla Hopkins is a 55 y.o. female who presents for A follow-up visit after recent hospitalization for atrial fibrillation.   She has no known CAD per cath back in 2009. She does have known history of peripheral arterial disease, paroxysmal atrial fibrillation, hypertension and hyperlipidemia.  She is followed for bilateral iliac stenting with covered stent on the right side for restenosis.  She recently went to urgent care for some neck pain. She was noted to be in atrial fibrillation and was sent to the emergency room where she was admitted to the hospital. She complained of some atypical chest pain. She underwent a Lexiscan nuclear stress test which showed no evidence of ischemia. Echocardiogram showed normal LV systolic function with moderately dilated left atrium and moderate amount of hypertension. Eliquis and diltiazem were added. She felt better after that but she is not completely back to her normal self. He continues to complain of nighttime palpitations. No chest pain or shortness of breath. No claudication. Most of her episodes of palpitations aren't happening at night when she is trying to sleep and she does report the possibility of her breathing stopping at night as she wakes up gasping for air.  Past Medical History  Diagnosis Date  . PAF (paroxysmal atrial fibrillation) ([place])   . Hypertension   . Hyperlipidemia   . Tobacco abuse     stopped smoking, 2016  . Claudication ([place])        . Depression   . Anxiety   . Herpes     no outbreaks-has Valtrex to take PRN  . Peripheral vascular disease ([place])  06/11/2009    Bilateral common iliac kissing stents (8X24 mm) . 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 She underwent successful balloon angioplasty.  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   . Shortness of breath dyspnea     patient states she has had lung test that came back normal  . Daily headache   . Migraine     "probably have 3/year; had one today; tried several medications-no relief" (02/11/2016)  . Arthritis     "knees, back" (02/11/2016)  . PTSD (post-traumatic stress disorder)     Past Surgical History  Procedure Laterality Date  . Iliac artery stent Bilateral 06/2009    common iliac kissing stents (8X24 mm)  . Lumbar disc surgery      spinal stenosis  . Knee arthroscopy Bilateral   . Abdominal angiogram N/A 05/11/2012    Procedure: ABDOMINAL ANGIOGRAM;  Surgeon: [name], MD;  Location: [facility] CATH LAB;  Service: Cardiovascular;  Laterality: N/A;  . Abdominal aortagram N/A 10/19/2012    Procedure: ABDOMINAL Maxcine Ham;  Surgeon: [name], MD;  Location: [facility] CATH LAB;  Service: Cardiovascular;  Laterality: N/A;  . Abdominal aortagram N/A 05/10/2013    Procedure: ABDOMINAL Maxcine Ham;  Surgeon: [name], MD;  Location: [place] CATH LAB;  Service: Cardiovascular;  Laterality: N/A;  . Robotic assisted total hysterectomy with salpingectomy Bilateral 09/23/2015    Procedure: ROBOTIC ASSISTED TOTAL HYSTERECTOMY BILATERALSALPINGECTOMY AND OOPHORECTOMY;  Surgeon: [name], MD;  Location: [facility];  Service: Gynecology;  Laterality: Bilateral;  . Vulvectomy N/A 09/23/2015    Procedure: WIDE EXCISION VULVECTOMY  WLE of vulvar VIN 2;  Surgeon: [name], MD;  Location: [facility];  Service: Gynecology;  Laterality: N/A;  . Back surgery    . Tubal ligation  1991  . Cardiac catheterization  2009    normal     Current  Outpatient Prescriptions  Medication Sig Dispense Refill  . acetaminophen (TYLENOL) 325 MG tablet Take 2 tablets (650 mg total) by mouth every 6 (six) hours as needed for mild pain (or Fever >/= 101).    [name] apixaban (ELIQUIS) 5 MG TABS tablet Take 1 tablet (5 mg total) by mouth 2 (two) times daily. 60 tablet 0  . atorvastatin (LIPITOR) 40 MG tablet TAKE 1 TABLET EVERY DAY  AT  6PM (Patient taking differently: TAKE 1 TABLET EVERY DAY  AT bedtime) 90 tablet 3  . cyclobenzaprine (FLEXERIL) 5 MG tablet Take 1 tablet (5 mg total) by mouth 3 (three) times daily as needed for muscle spasms. 10 tablet 0  . diltiazem (CARDIZEM CD) 180 MG 24 hr capsule Take 1 capsule (180 mg total) by mouth daily. 30 capsule 0  . esomeprazole (NEXIUM) 20 MG capsule Take 20 mg by mouth at bedtime.     . fish oil-omega-3 fatty acids 1000 MG capsule Take 1 g by mouth at bedtime.     . metoprolol succinate (TOPROL-XL) 50 MG 24 hr tablet TAKE 1 TABLET DAILY. TAKE WITH OR IMMEDIATELY FOLLOWING A MEAL. (Patient taking differently: TAKE 1 TABLET AT BEDTIME) 90 tablet 3   No current facility-administered medications for this visit.    Allergies:   Pradaxa and Zocor    Social History:  The patient  reports that she quit smoking about 3 months ago. Her smoking use included Cigarettes. She has a 3.7 pack-year smoking history. She has never used smokeless tobacco. She reports that she does not drink alcohol or use illicit drugs.   Family History:  The patient's family history includes Cancer in her maternal grandfather and paternal grandfather; Colon polyps in her sister; Heart attack in her father; Heart disease in her father; Hypertension in her brother and mother; Lung cancer in her mother; Ovarian cancer in her mother.    ROS:  Please see the history of present illness.   Otherwise, review of systems are positive for none.   All other systems are reviewed and negative.    PHYSICAL EXAM: VS:  BP 151/80 mmHg  Pulse 72  Ht 5'  6" (1.676 m)  Wt 196 lb 9.6 oz (89.177 kg)  BMI 31.75 kg/m2  LMP 11/09/2008 , BMI Body mass index is 31.75 kg/(m^2). GEN: Well nourished, well developed, in no acute distress HEENT: normal Neck: no JVD, carotid bruits, or masses Cardiac: Irregularly irregular; no murmurs, rubs, or gallops,no edema  Respiratory:  clear to auscultation bilaterally, normal work of breathing GI: soft, nontender, nondistended, + BS MS: no deformity or atrophy Skin: warm and dry, no rash Neuro:  Strength and sensation are intact Psych: euthymic mood, full affect   EKG:  EKG is not ordered today.    Recent Labs: 02/10/2016: TSH 1.124 02/12/2016: BUN 18; Creatinine, Ser 0.91; Hemoglobin 12.5; Magnesium 2.0; Platelets 213; Potassium 3.5; Sodium 140  Lipid Panel    Component Value Date/Time   CHOL 188 06/08/2012 1526   TRIG 474.0* 06/08/2012 1526   HDL 46.00 06/08/2012 1526   CHOLHDL 4 06/08/2012 1526   VLDL 94.8* 06/08/2012 1526   LDLCALC * 08/11/2008 0340    147        Total Cholesterol/HDL:CHD Risk Coronary Heart Disease Risk Table                     Men   Women  1/2 Average Risk   3.4   3.3   LDLDIRECT 77.8 06/08/2012 1526      Wt Readings from Last 3 Encounters:  02/25/16 196 lb 9.6 oz (89.177 kg)  02/12/16 192 lb 12.8 oz (87.454 kg)  02/10/16 196 lb 9.6 oz (89.177 kg)       ASSESSMENT AND PLAN:  1.  Persistent atrial fibrillation: She appears to be in atrial fibrillation today by physical exam. She is overall feeling better but she reports continued almost daily nighttime palpitations. I requested a 48-hour Holter monitor. Continue anticoagulation with Eliquis. If she continues to be in atrial fibrillation, I recommend proceeding with cardioversion after effective anticoagulation  2. Possible sleep apnea: This might be contributing to her symptoms. I requested a sleep study.  3. Peripheral arterial disease: No claudication. Most recent noninvasive vascular evaluation in October  2016 showed patent iliac stents.  4. Essential hypertension: Blood pressure is mildly elevated today but she appears to be anxious.  5. Hyperlipidemia: Continue treatment with atorvastatin with a target LDL of less than 70.    Disposition:   FU with me in 1 month  Signed,  Kathlyn Sacramento, MD  02/25/2016 9:44 AM    [place]

## 2016-02-25 NOTE — Patient Instructions (Signed)
Medication Instructions:  Your physician recommends that you continue on your current medications as directed. Please refer to the Current Medication list given to you today.  Labwork: No new orders.   Testing/Procedures: Your physician has recommended that you wear a 48 hour holter monitor. Holter monitors are medical devices that record the heart's electrical activity. Doctors most often use these monitors to diagnose arrhythmias. Arrhythmias are problems with the speed or rhythm of the heartbeat. The monitor is a small, portable device. You can wear one while you do your normal daily activities. This is usually used to diagnose what is causing palpitations/syncope (passing out).  Your physician has recommended that you have a sleep study. This test records several body functions during sleep, including: brain activity, eye movement, oxygen and carbon dioxide blood levels, heart rate and rhythm, breathing rate and rhythm, the flow of air through your mouth and nose, snoring, body muscle movements, and chest and belly movement.  Follow-Up: Your physician recommends that you schedule a follow-up appointment in: 1 MONTH with Dr Fletcher Anon   Any Other Special Instructions Will Be Listed Below (If Applicable).     If you need a refill on your cardiac medications before your next appointment, please call your pharmacy.

## 2016-02-27 ENCOUNTER — Ambulatory Visit (INDEPENDENT_AMBULATORY_CARE_PROVIDER_SITE_OTHER): Payer: Commercial Managed Care - HMO

## 2016-02-27 DIAGNOSIS — I480 Paroxysmal atrial fibrillation: Secondary | ICD-10-CM

## 2016-02-27 NOTE — Telephone Encounter (Signed)
Call to patient. She is given message from Dr. Sabra Heck and verbalized understanding of results.  She is agreeable to scheduling annual exam and vulvar colposcopy with Dr. Sabra Heck. Scheduled for 08/14/16 at 1000.  06 recall entered for 6 months.   Dr. Sabra Heck,  Patient now on Eliquis, please advise if needs instructions from cardiology regarding Eliquis and vulvar colposcopy that is scheduled.

## 2016-02-28 NOTE — Telephone Encounter (Signed)
No additional instructions needed.  I will repeat colposcopy and can do repeat vulvar biopsy if needed while pt is on Eliquis.  Thanks.

## 2016-03-03 NOTE — Telephone Encounter (Signed)
Call to patient. Advised of update provided by dr Sabra Heck. Encounter closed.

## 2016-03-12 ENCOUNTER — Telehealth: Payer: Self-pay | Admitting: *Deleted

## 2016-03-12 ENCOUNTER — Encounter: Payer: Self-pay | Admitting: *Deleted

## 2016-03-12 DIAGNOSIS — Z01812 Encounter for preprocedural laboratory examination: Secondary | ICD-10-CM

## 2016-03-12 NOTE — Addendum Note (Signed)
Addended by: Loren Racer on: 03/12/2016 09:56 AM   Modules accepted: Orders

## 2016-03-12 NOTE — Telephone Encounter (Signed)
Spoke with pt and informed her of holter monitor results. Scheduled labs for 03/13/16. Scheduled DCCV for 03/19/16 with Dr. Debara Pickett. Went over instructions with pt and advised that I will place letter at front desk for her to pick up tomorrow when she comes for labs. Pt denies missing any doses of Eliquis and was advised not to miss any before her DCCV next week. Pt aware to let us know if she does happen to miss a dose. Pt verbalized understanding of everything and was in agreement with this plan.

## 2016-03-12 NOTE — Telephone Encounter (Signed)
-----   Message from [name], MD sent at 03/11/2016  4:46 PM EDT ----- Inform patient that Holter monitor showed persistent atrial fibrillation. Ventricular rate is not well controlled. I recommend scheduling cardioversion for next week. Make sure she is taking Eliquis regularly.

## 2016-03-13 ENCOUNTER — Other Ambulatory Visit (INDEPENDENT_AMBULATORY_CARE_PROVIDER_SITE_OTHER): Payer: Commercial Managed Care - HMO | Admitting: *Deleted

## 2016-03-13 DIAGNOSIS — I4891 Unspecified atrial fibrillation: Secondary | ICD-10-CM | POA: Diagnosis not present

## 2016-03-13 DIAGNOSIS — I480 Paroxysmal atrial fibrillation: Secondary | ICD-10-CM | POA: Diagnosis not present

## 2016-03-13 DIAGNOSIS — I10 Essential (primary) hypertension: Secondary | ICD-10-CM

## 2016-03-13 DIAGNOSIS — I739 Peripheral vascular disease, unspecified: Secondary | ICD-10-CM | POA: Diagnosis not present

## 2016-03-13 DIAGNOSIS — R0602 Shortness of breath: Secondary | ICD-10-CM | POA: Diagnosis not present

## 2016-03-13 LAB — BASIC METABOLIC PANEL
BUN: 20 mg/dL (ref 7–25)
CALCIUM: 9.3 mg/dL (ref 8.6–10.4)
CHLORIDE: 104 mmol/L (ref 98–110)
CO2: 27 mmol/L (ref 20–31)
CREATININE: 0.87 mg/dL (ref 0.50–1.05)
GLUCOSE: 98 mg/dL (ref 65–99)
Potassium: 4.5 mmol/L (ref 3.5–5.3)
Sodium: 139 mmol/L (ref 135–146)

## 2016-03-13 LAB — CBC WITH DIFFERENTIAL/PLATELET
BASOS ABS: 64 {cells}/uL (ref 0–200)
Basophils Relative: 1 %
EOS ABS: 128 {cells}/uL (ref 15–500)
EOS PCT: 2 %
HCT: 40.8 % (ref 35.0–45.0)
HEMOGLOBIN: 13.4 g/dL (ref 11.7–15.5)
LYMPHS ABS: 2112 {cells}/uL (ref 850–3900)
Lymphocytes Relative: 33 %
MCH: 28.6 pg (ref 27.0–33.0)
MCHC: 32.8 g/dL (ref 32.0–36.0)
MCV: 87 fL (ref 80.0–100.0)
MONOS PCT: 6 %
MPV: 10.3 fL (ref 7.5–12.5)
Monocytes Absolute: 384 cells/uL (ref 200–950)
NEUTROS ABS: 3712 {cells}/uL (ref 1500–7800)
NEUTROS PCT: 58 %
Platelets: 222 10*3/uL (ref 140–400)
RBC: 4.69 MIL/uL (ref 3.80–5.10)
RDW: 13.8 % (ref 11.0–15.0)
WBC: 6.4 10*3/uL (ref 3.8–10.8)

## 2016-03-13 LAB — PROTIME-INR
INR: 1.11 (ref <1.50)
Prothrombin Time: 14.4 seconds (ref 11.6–15.2)

## 2016-03-13 NOTE — Addendum Note (Signed)
Addended by: Eulis Foster on: 03/13/2016 10:22 AM   Modules accepted: Orders

## 2016-03-18 ENCOUNTER — Other Ambulatory Visit: Payer: Self-pay | Admitting: Cardiovascular Disease

## 2016-03-19 ENCOUNTER — Ambulatory Visit (HOSPITAL_COMMUNITY): Payer: Commercial Managed Care - HMO | Admitting: Critical Care Medicine

## 2016-03-19 ENCOUNTER — Encounter (HOSPITAL_COMMUNITY)
Admission: RE | Disposition: A | Discharge status: Home / Self Care | Payer: Self-pay | Source: Ambulatory Visit | Attending: Internal Medicine

## 2016-03-19 ENCOUNTER — Ambulatory Visit (HOSPITAL_COMMUNITY)
Admission: RE | Admit: 2016-03-19 | Discharge: 2016-03-19 | Disposition: A | Discharge status: Home / Self Care | Payer: Commercial Managed Care - HMO | Source: Ambulatory Visit | Attending: Internal Medicine | Admitting: Internal Medicine

## 2016-03-19 ENCOUNTER — Encounter (HOSPITAL_COMMUNITY): Payer: Self-pay | Admitting: Internal Medicine

## 2016-03-19 DIAGNOSIS — I739 Peripheral vascular disease, unspecified: Secondary | ICD-10-CM | POA: Diagnosis not present

## 2016-03-19 DIAGNOSIS — I10 Essential (primary) hypertension: Secondary | ICD-10-CM | POA: Insufficient documentation

## 2016-03-19 DIAGNOSIS — Z87891 Personal history of nicotine dependence: Secondary | ICD-10-CM | POA: Insufficient documentation

## 2016-03-19 DIAGNOSIS — I481 Persistent atrial fibrillation: Secondary | ICD-10-CM | POA: Diagnosis not present

## 2016-03-19 DIAGNOSIS — I4819 Other persistent atrial fibrillation: Secondary | ICD-10-CM | POA: Insufficient documentation

## 2016-03-19 DIAGNOSIS — I4891 Unspecified atrial fibrillation: Secondary | ICD-10-CM | POA: Diagnosis not present

## 2016-03-19 HISTORY — PX: CARDIOVERSION: SHX1299

## 2016-03-19 SURGERY — CARDIOVERSION
Anesthesia: General

## 2016-03-19 MED ORDER — LIDOCAINE HCL (CARDIAC) 20 MG/ML IV SOLN
INTRAVENOUS | Status: DC | PRN
  Administered 2016-03-19: 60 mg via INTRATRACHEAL

## 2016-03-19 MED ORDER — PROPOFOL 10 MG/ML IV BOLUS
INTRAVENOUS | Status: DC | PRN
  Administered 2016-03-19: 40 mg via INTRAVENOUS
  Administered 2016-03-19: 50 mg via INTRAVENOUS
  Administered 2016-03-19: 80 mg via INTRAVENOUS

## 2016-03-19 MED ORDER — SODIUM CHLORIDE 0.9 % IV SOLN
INTRAVENOUS | Status: DC
Start: 2016-03-19 — End: 2016-03-19
  Administered 2016-03-19: 13:00:00 via INTRAVENOUS

## 2016-03-19 MED ORDER — TRAMADOL HCL 50 MG PO TABS
50.0000 mg | ORAL_TABLET | ORAL | Status: AC
  Administered 2016-03-19: 50 mg via ORAL
  Filled 2016-03-19: qty 1

## 2016-03-19 NOTE — Transfer of Care (Signed)
Immediate Anesthesia Transfer of Care Note  Patient: Kayla Hopkins  Procedure(s) Performed: Procedure(s): CARDIOVERSION (N/A)  Patient Location: Endoscopy Unit  Anesthesia Type:General  Level of Consciousness: awake and alert   Airway & Oxygen Therapy: Patient Spontanous Breathing  Post-op Assessment: Report given to RN and Post -op Vital signs reviewed and stable  Post vital signs: Reviewed and stable  Last Vitals:  Filed Vitals:   03/19/16 1150 03/19/16 1251  BP: 169/88 156/73  Pulse: 94   Temp: 36.7 C   Resp: 14 10    Last Pain:  Filed Vitals:   03/19/16 1257  PainSc: 8          Complications: No apparent anesthesia complications

## 2016-03-19 NOTE — Discharge Instructions (Signed)
Electrical Cardioversion Electrical cardioversion is the delivery of a jolt of electricity to change the rhythm of the heart. Sticky patches or metal paddles are placed on the chest to deliver the electricity from a device. This is done to restore a normal rhythm. A rhythm that is too fast or not regular keeps the heart from pumping well. Electrical cardioversion is done in an emergency if:   There is low or no blood pressure as a result of the heart rhythm.   Normal rhythm must be restored as fast as possible to protect the brain and heart from further damage.   It may save a life. Cardioversion may be done for heart rhythms that are not immediately life threatening, such as atrial fibrillation or flutter, in which:   The heart is beating too fast or is not regular.   Medicine to change the rhythm has not worked.   It is safe to wait in order to allow time for preparation.  Symptoms of the abnormal rhythm are bothersome.  The risk of stroke and other serious problems can be reduced. LET [facility] CARE PROVIDER KNOW ABOUT:   Any allergies you have.  All medicines you are taking, including vitamins, herbs, eye drops, creams, and over-the-counter medicines.  Previous problems you or members of your family have had with the use of anesthetics.   Any blood disorders you have.   Previous surgeries you have had.   Medical conditions you have. RISKS AND COMPLICATIONS  Generally, this is a safe procedure. However, problems can occur and include:   Breathing problems related to the anesthetic used.  A blood clot that breaks free and travels to other parts of your body. This could cause a stroke or other problems. The risk of this is lowered by use of blood-thinning medicine (anticoagulant) prior to the procedure.  Cardiac arrest (rare). BEFORE THE PROCEDURE   You may have tests to detect blood clots in your heart and to evaluate heart function.  You may start taking  anticoagulants so your blood does not clot as easily.   Medicines may be given to help stabilize your heart rate and rhythm. PROCEDURE  You will be given medicine through an IV tube to reduce discomfort and make you sleepy (sedative).   An electrical shock will be delivered. AFTER THE PROCEDURE Your heart rhythm will be watched to make sure it does not change.    This information is not intended to replace advice given to you by your health care provider. Make sure you discuss any questions you have with your health care provider.   Document Released: 10/16/2002 Document Revised: 11/16/2014 Document Reviewed: 05/10/2013 Elsevier Interactive Patient Education 2016 [place] Monitored anesthesia care is an anesthesia service for a medical procedure. Anesthesia is the loss of the ability to feel pain. It is produced by medicines called anesthetics. It may affect a small area of your body (local anesthesia), a large area of your body (regional anesthesia), or your entire body (general anesthesia). The need for monitored anesthesia care depends your procedure, your condition, and the potential need for regional or general anesthesia. It is often provided during procedures where:   General anesthesia may be needed if there are complications. This is because you need special care when you are under general anesthesia.   You will be under local or regional anesthesia. This is so that you are able to have higher levels of anesthesia if needed.   You will receive calming  medicines (sedatives). This is especially the case if sedatives are given to put you in a semi-conscious state of relaxation (deep sedation). This is because the amount of sedative needed to produce this state can be hard to predict. Too much of a sedative can produce general anesthesia. Monitored anesthesia care is performed by one or more health care providers who have special training in all types  of anesthesia. You will need to meet with these health care providers before your procedure. During this meeting, they will ask you about your medical history. They will also give you instructions to follow. (For example, you will need to stop eating and drinking before your procedure. You may also need to stop or change medicines you are taking.) During your procedure, your health care providers will stay with you. They will:   Watch your condition. This includes watching your blood pressure, breathing, and level of pain.   Diagnose and treat problems that occur.   Give medicines if they are needed. These may include calming medicines (sedatives) and anesthetics.   Make sure you are comfortable.  Having monitored anesthesia care does not necessarily mean that you will be under anesthesia. It does mean that your health care providers will be able to manage anesthesia if you need it or if it occurs. It also means that you will be able to have a different type of anesthesia than you are having if you need it. When your procedure is complete, your health care providers will continue to watch your condition. They will make sure any medicines wear off before you are allowed to go home.    This information is not intended to replace advice given to you by your health care provider. Make sure you discuss any questions you have with your health care provider.   Document Released: 07/22/2005 Document Revised: 11/16/2014 Document Reviewed: 12/07/2012 Elsevier Interactive Patient Education [employer].

## 2016-03-19 NOTE — H&P (Signed)
     INTERVAL PROCEDURE H&P  History and Physical Interval Note:  03/19/2016 11:41 AM  Kayla Hopkins has presented today for their planned procedure. The various methods of treatment have been discussed with the patient and family. After consideration of risks, benefits and other options for treatment, the patient has consented to the procedure.  The patients' outpatient history has been reviewed, patient examined, and no change in status from most recent office note within the past 30 days. I have reviewed the patients' chart and labs and will proceed as planned. Questions were answered to the patient's satisfaction.   Pixie Casino, MD, [facility] Attending Cardiologist [place] 03/19/2016, 11:41 AM

## 2016-03-19 NOTE — OR Nursing (Signed)
Cardioversion unsuccessful.  Pt complaining of difficulty breathing and chest pain. Chest pain 7/10.   Sat at 100% on 4 L,.Dr. Debara Pickett here  Stat 12 lead ordered.

## 2016-03-19 NOTE — Anesthesia Preprocedure Evaluation (Addendum)
Anesthesia Evaluation  Patient identified by MRN, date of birth, ID band Patient awake    Reviewed: Allergy & Precautions, NPO status , Patient's Chart, lab work & pertinent test results, reviewed documented beta blocker date and time   Airway Mallampati: II  TM Distance: >3 FB Neck ROM: Full    Dental  (+) Dental Advisory Given   Pulmonary shortness of breath and with exertion, former smoker,    breath sounds clear to auscultation       Cardiovascular hypertension, Pt. on medications and Pt. on home beta blockers + Peripheral Vascular Disease  + dysrhythmias Atrial Fibrillation  Rhythm:irregular Rate:Normal     Neuro/Psych  Headaches, Anxiety Depression    GI/Hepatic   Endo/Other  obese  Renal/GU      Musculoskeletal  (+) Arthritis ,   Abdominal   Peds  Hematology   Anesthesia Other Findings   Reproductive/Obstetrics                            Anesthesia Physical Anesthesia Plan  ASA: III  Anesthesia Plan: General   Post-op Pain Management:    Induction: Intravenous  Airway Management Planned: Mask  Additional Equipment:   Intra-op Plan:   Post-operative Plan:   Informed Consent: I have reviewed the patients History and Physical, chart, labs and discussed the procedure including the risks, benefits and alternatives for the proposed anesthesia with the patient or authorized representative who has indicated his/her understanding and acceptance.   Dental advisory given  Plan Discussed with: Anesthesiologist and Surgeon  Anesthesia Plan Comments:         Anesthesia Quick Evaluation

## 2016-03-19 NOTE — CV Procedure (Signed)
    CARDIOVERSION NOTE  Procedure: Electrical Cardioversion Indications:  Atrial Fibrillation  Procedure Details:  Consent: Risks of procedure as well as the alternatives and risks of each were explained to the (patient/caregiver).  Consent for procedure obtained.  Time Out: Verified patient identification, verified procedure, site/side was marked, verified correct patient position, special equipment/implants available, medications/allergies/relevent history reviewed, required imaging and test results available.  Performed  Patient placed on cardiac monitor, pulse oximetry, supplemental oxygen as necessary.  Sedation given: Propofol per anesthesia Pacer pads placed anterior and posterior chest.  Cardioverted 3 time(s).  Cardioverted at 120J,150J, 200J biphasic.  Impression: Findings: Post procedure EKG shows: brief period of NSR, then reverted back to a-fib Complications: None Patient did tolerate procedure well.  Plan: 1. Unsuccessful DCCV. She will likely need antiarrythmic therapy to have a chance to achieve and maintain sinus rhythm 2. Continue Eliquis 3. Follow-up with Dr. Fletcher Anon  Time Spent Directly with the Patient:  30 minutes   Pixie Casino, MD, [facility] Attending Cardiologist [place] 03/19/2016, 1:25 PM

## 2016-03-19 NOTE — Anesthesia Procedure Notes (Signed)
Date/Time: 03/19/2016 1:14 PM Performed by: Merrilyn Puma B Pre-anesthesia Checklist: Patient identified, Emergency Drugs available, Suction available, Patient being monitored and Timeout performed Patient Re-evaluated:Patient Re-evaluated prior to inductionOxygen Delivery Method: Ambu bag Preoxygenation: Pre-oxygenation with 100% oxygen Intubation Type: IV induction Ventilation: Mask ventilation without difficulty Placement Confirmation: breath sounds checked- equal and bilateral Dental Injury: Teeth and Oropharynx as per pre-operative assessment

## 2016-03-19 NOTE — Progress Notes (Signed)
Patient complained of chest wall soreness after cardioversion. She also had pleuritic discomfort. Post-procedure EKG shows no acute ST/T changes and persistent atrial fibrillation. There is no evidence for skin burn. She reports tenderness to palpation over the anterior chest wall. Will administer tramadol 50 mg x 1 now for pain. Monitor for pain improvement and if the pain improved, okay for discharge.  Pixie Casino, MD, [facility] Attending Cardiologist [place]

## 2016-03-20 ENCOUNTER — Encounter (HOSPITAL_COMMUNITY): Payer: Self-pay | Admitting: Internal Medicine

## 2016-03-20 NOTE — Anesthesia Postprocedure Evaluation (Signed)
Anesthesia Post Note  Patient: Kayla Hopkins  Procedure(s) Performed: Procedure(s) (LRB): CARDIOVERSION (N/A)  Patient location during evaluation: PACU Anesthesia Type: General Level of consciousness: awake and alert and patient cooperative Pain management: pain level controlled Vital Signs Assessment: post-procedure vital signs reviewed and stable Respiratory status: spontaneous breathing and respiratory function stable Cardiovascular status: stable Anesthetic complications: no    Last Vitals:  Filed Vitals:   03/19/16 1420 03/19/16 1430  BP: 151/77 142/78  Pulse: 66 51  Temp:    Resp: 15 12    Last Pain:  Filed Vitals:   03/19/16 1441  PainSc: [place]

## 2016-03-24 ENCOUNTER — Encounter (HOSPITAL_COMMUNITY): Payer: Self-pay | Admitting: Nurse Practitioner

## 2016-03-24 ENCOUNTER — Ambulatory Visit (HOSPITAL_COMMUNITY)
Admission: RE | Admit: 2016-03-24 | Discharge: 2016-03-24 | Disposition: A | Discharge status: Home / Self Care | Payer: Commercial Managed Care - HMO | Source: Ambulatory Visit | Attending: Nurse Practitioner | Admitting: Nurse Practitioner

## 2016-03-24 VITALS — BP 122/70 | HR 77 | Ht 66.0 in | Wt 199.0 lb

## 2016-03-24 DIAGNOSIS — Z7901 Long term (current) use of anticoagulants: Secondary | ICD-10-CM | POA: Diagnosis not present

## 2016-03-24 DIAGNOSIS — I4819 Other persistent atrial fibrillation: Secondary | ICD-10-CM

## 2016-03-24 DIAGNOSIS — Z888 Allergy status to other drugs, medicaments and biological substances status: Secondary | ICD-10-CM | POA: Insufficient documentation

## 2016-03-24 DIAGNOSIS — Z79899 Other long term (current) drug therapy: Secondary | ICD-10-CM | POA: Insufficient documentation

## 2016-03-24 DIAGNOSIS — Z95820 Peripheral vascular angioplasty status with implants and grafts: Secondary | ICD-10-CM | POA: Insufficient documentation

## 2016-03-24 DIAGNOSIS — E785 Hyperlipidemia, unspecified: Secondary | ICD-10-CM | POA: Insufficient documentation

## 2016-03-24 DIAGNOSIS — I739 Peripheral vascular disease, unspecified: Secondary | ICD-10-CM | POA: Diagnosis not present

## 2016-03-24 DIAGNOSIS — I10 Essential (primary) hypertension: Secondary | ICD-10-CM | POA: Insufficient documentation

## 2016-03-24 DIAGNOSIS — Z87891 Personal history of nicotine dependence: Secondary | ICD-10-CM | POA: Diagnosis not present

## 2016-03-24 DIAGNOSIS — I481 Persistent atrial fibrillation: Secondary | ICD-10-CM | POA: Diagnosis not present

## 2016-03-24 DIAGNOSIS — Z8249 Family history of ischemic heart disease and other diseases of the circulatory system: Secondary | ICD-10-CM | POA: Insufficient documentation

## 2016-03-24 DIAGNOSIS — I4891 Unspecified atrial fibrillation: Secondary | ICD-10-CM | POA: Diagnosis present

## 2016-03-24 NOTE — Progress Notes (Signed)
Patient ID: Kayla Hopkins, female   DOB: [birth year], 55 y.o.   MRN: [id number]     Primary Care Physician: Hoyt Koch, MD Referring Physician: Dr. Tommye Standard Kayla Hopkins is a 55 y.o. female with a h/o PAF , per pt, persistent since last November. She recently failed cardioversion, and is in the afib clinic for further options. She states that she is pending a sleep study at the end of May. She is not aware of being in afib, does not cause her undo shortness of breath or fatigue during the day. Has her symptoms at night with poor sleeping and waking up with shortness of breath,feeling her heart racing. Her husband does confirm that she does snore and has apnea episodes. Most of her symptoms at night may be driven by sleep apnea. Echo showed EF of 55-60%, with Left atrium at 43 mm. Myoview, 02/12/16, showed low risk scan.  She does not drink alcohol, no tobacco, is obese and does not exercise.  Today, she denies symptoms of palpitations, chest pain, shortness of breath, orthopnea, PND, lower extremity edema, dizziness, presyncope, syncope, or neurologic sequela. The patient is tolerating medications without difficulties and is otherwise without complaint today.   Past Medical History  Diagnosis Date  . PAF (paroxysmal atrial fibrillation) ([place])   . Hypertension   . Hyperlipidemia   . Tobacco abuse     stopped smoking, 2016  . Claudication ([place])        . Depression   . Anxiety   . Herpes     no outbreaks-has Valtrex to take PRN  . Peripheral vascular disease ([place]) 06/11/2009    Bilateral common iliac kissing stents (8X24 mm) . 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 She underwent successful balloon angioplasty.  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   . Shortness of breath dyspnea     patient states she has had  lung test that came back normal  . Daily headache   . Migraine     "probably have 3/year; had one today; tried several medications-no relief" (02/11/2016)  . Arthritis     "knees, back" (02/11/2016)  . PTSD (post-traumatic stress disorder)    Past Surgical History  Procedure Laterality Date  . Iliac artery stent Bilateral 06/2009    common iliac kissing stents (8X24 mm)  . Lumbar disc surgery      spinal stenosis  . Knee arthroscopy Bilateral   . Abdominal angiogram N/A 05/11/2012    Procedure: ABDOMINAL ANGIOGRAM;  Surgeon: [name], MD;  Location: [facility] CATH LAB;  Service: Cardiovascular;  Laterality: N/A;  . Abdominal aortagram N/A 10/19/2012    Procedure: ABDOMINAL Maxcine Ham;  Surgeon: [name], MD;  Location: [facility] CATH LAB;  Service: Cardiovascular;  Laterality: N/A;  . Abdominal aortagram N/A 05/10/2013    Procedure: ABDOMINAL Maxcine Ham;  Surgeon: [name], MD;  Location: [facility] CATH LAB;  Service: Cardiovascular;  Laterality: N/A;  . Robotic assisted total hysterectomy with salpingectomy Bilateral 09/23/2015    Procedure: ROBOTIC ASSISTED TOTAL HYSTERECTOMY BILATERALSALPINGECTOMY AND OOPHORECTOMY;  Surgeon: [name], MD;  Location: [facility] ORS;  Service: Gynecology;  Laterality: Bilateral;  . Vulvectomy N/A 09/23/2015    Procedure: WIDE EXCISION VULVECTOMY  WLE of vulvar VIN 2;  Surgeon: [name], MD;  Location: [place] ORS;  Service: Gynecology;  Laterality: N/A;  . Back surgery    . Tubal ligation  1991  . Cardiac catheterization  2009    normal  . Cardioversion N/A 03/19/2016    Procedure: CARDIOVERSION;  Surgeon: Pixie Casino, MD;  [facility] ENDOSCOPY;  Service: Cardiovascular;  Laterality: N/A;    Current Outpatient Prescriptions  Medication Sig Dispense Refill  . acetaminophen (TYLENOL) 325 MG tablet Take 2 tablets (650 mg total) by mouth every 6 (six) hours as needed for mild pain (or Fever >/= 101).    [name] apixaban (ELIQUIS) 5 MG TABS tablet Take 1 tablet  (5 mg total) by mouth 2 (two) times daily. 180 tablet 3  . atorvastatin (LIPITOR) 40 MG tablet TAKE 1 TABLET EVERY DAY  AT  6PM (Patient taking differently: TAKE 1 TABLET EVERY DAY  AT bedtime) 90 tablet 3  . diltiazem (CARDIZEM CD) 180 MG 24 hr capsule Take 1 capsule (180 mg total) by mouth daily. 90 capsule 3  . esomeprazole (NEXIUM) 20 MG capsule Take 20 mg by mouth at bedtime.     . fish oil-omega-3 fatty acids 1000 MG capsule Take 1 g by mouth at bedtime.     . metoprolol succinate (TOPROL-XL) 50 MG 24 hr tablet TAKE 1 TABLET DAILY. TAKE WITH OR IMMEDIATELY FOLLOWING A MEAL. (Patient taking differently: TAKE 1 TABLET AT BEDTIME) 90 tablet 3   No current facility-administered medications for this encounter.    Allergies  Allergen Reactions  . Pradaxa [Dabigatran Etexilate Mesylate]     Gastritis   . Zocor [Simvastatin]     Leg pains     Social History   Social History  . Marital Status: Single    Spouse Name: N/A  . Number of Children: 3  . Years of Education: N/A   Occupational History  . Not on file.   Social History Main Topics  . Smoking status: Former Smoker -- 0.10 packs/day for 37 years    Types: Cigarettes    Quit date: 11/10/2015  . Smokeless tobacco: Never Used     Comment: 02/11/2016 "using electronic cigarettes"  . Alcohol Use: No  . Drug Use: No  . Sexual Activity:    Partners: Male   Other Topics Concern  . Not on file   Social History Narrative    Family History  Problem Relation Age of Onset  . Hypertension Mother   . Heart disease Father   . Heart attack Father   . Hypertension Brother   . Ovarian cancer Mother   . Lung cancer Mother   . Colon polyps Sister   . Cancer Maternal Grandfather     lung  . Cancer Paternal Grandfather     throat     ROS- All systems are reviewed and negative except as per the HPI above  Physical Exam: Filed Vitals:   03/24/16 1014  BP: 122/70  Pulse: 77  Height: 5\' 6"  (QA348G m)  Weight: 199 lb (90.266  kg)    GEN- The patient is well appearing, alert and oriented x 3 today.   Head- normocephalic, atraumatic Eyes-  Sclera clear, conjunctiva pink Ears- hearing intact Oropharynx- clear Neck- supple, no JVP Lymph- no cervical lymphadenopathy Lungs- Clear to ausculation bilaterally, normal work of breathing Heart- irregular rate and rhythm, no murmurs, rubs or gallops, PMI not laterally displaced GI- soft, NT, ND, + BS Extremities- no clubbing, cyanosis, or edema MS- no significant deformity or atrophy Skin- no rash or lesion Psych- euthymic mood, full affect Neuro-  strength and sensation are intact  EKG- Afib at 77 bpm, qrs int 84 ms, qtc 405 ms Epic records reviewed  Assessment and Plan: 1. Persistent minimally symptomatic afib Explored options to restore SR Without any known CAD or reduced  EF, would recommend trying flecainide with  Repeat cardioversion, if drug did not convert, and if drug fails then tikosyn.  Pt would rather wait until she has sleep study and get sleep apnea treated and then pursue antiarrythmic therapy. She is currently rate controlled, and is taking apixaban with CHA2DS2VASc score of at least 3.  F/u in afib clinic in 6 weeks   Geroge Baseman. Keyasha Miah, [facility] [address] [place], [place] [address] [phone]

## 2016-03-26 ENCOUNTER — Other Ambulatory Visit: Payer: Self-pay

## 2016-04-07 ENCOUNTER — Ambulatory Visit (HOSPITAL_BASED_OUTPATIENT_CLINIC_OR_DEPARTMENT_OTHER): Payer: Commercial Managed Care - HMO | Attending: Cardiovascular Disease | Admitting: Cardiovascular Disease

## 2016-04-07 VITALS — Ht 66.0 in | Wt 199.0 lb

## 2016-04-07 DIAGNOSIS — I480 Paroxysmal atrial fibrillation: Secondary | ICD-10-CM | POA: Diagnosis not present

## 2016-04-07 DIAGNOSIS — Z7901 Long term (current) use of anticoagulants: Secondary | ICD-10-CM | POA: Insufficient documentation

## 2016-04-07 DIAGNOSIS — Z79899 Other long term (current) drug therapy: Secondary | ICD-10-CM | POA: Diagnosis not present

## 2016-04-07 DIAGNOSIS — E669 Obesity, unspecified: Secondary | ICD-10-CM | POA: Diagnosis not present

## 2016-04-07 DIAGNOSIS — Z6832 Body mass index (BMI) 32.0-32.9, adult: Secondary | ICD-10-CM | POA: Insufficient documentation

## 2016-04-07 DIAGNOSIS — R5383 Other fatigue: Secondary | ICD-10-CM | POA: Diagnosis not present

## 2016-04-07 DIAGNOSIS — G4733 Obstructive sleep apnea (adult) (pediatric): Secondary | ICD-10-CM | POA: Insufficient documentation

## 2016-04-07 DIAGNOSIS — I10 Essential (primary) hypertension: Secondary | ICD-10-CM | POA: Insufficient documentation

## 2016-04-07 DIAGNOSIS — R0683 Snoring: Secondary | ICD-10-CM | POA: Diagnosis not present

## 2016-04-12 ENCOUNTER — Encounter (HOSPITAL_BASED_OUTPATIENT_CLINIC_OR_DEPARTMENT_OTHER): Payer: Self-pay | Admitting: Cardiovascular Disease

## 2016-04-12 NOTE — Procedures (Signed)
Patient Name: Kayla Hopkins, Kayla Hopkins Date: 04/07/2016 Gender: Female D.O.B: 1961-10-21 Age (years): 54 Referring Provider: Kathlyn Sacramento Height (inches): 48 Interpreting Physician: Shelva Majestic MD, ABSM Weight (lbs): 199 RPSGT: Carolin Coy BMI: 32 MRN: [id number] Neck Size: 14.00  CLINICAL INFORMATION Sleep Study Type: Split Night CPAP Indication for sleep study: Fatigue, Hypertension, Obesity, Snoring Epworth Sleepiness Score: 4  SLEEP STUDY TECHNIQUE As per the AASM Manual for the Scoring of Sleep and Associated Events v2.3 (April 2016) with a hypopnea requiring 4% desaturations. The channels recorded and monitored were frontal, central and occipital EEG, electrooculogram (EOG), submentalis EMG (chin), nasal and oral airflow, thoracic and abdominal wall motion, anterior tibialis EMG, snore microphone, electrocardiogram, and pulse oximetry. Continuous positive airway pressure (CPAP) was initiated when the patient met split night criteria and was titrated according to treat sleep-disordered breathing.  MEDICATIONS  acetaminophen (TYLENOL) 325 MG tablet 650 mg, Every 6 hours PRN     apixaban (ELIQUIS) 5 MG TABS tablet 5 mg, 2 times daily     atorvastatin (LIPITOR) 40 MG tablet      Patient taking differently: TAKE 1 TABLET EVERY DAY  AT bedtime, Reason: Free Text Sig Edit, Reported on 02/10/2016   diltiazem (CARDIZEM CD) 180 MG 24 hr capsule 180 mg, Daily     esomeprazole (NEXIUM) 20 MG capsule 20 mg, Daily at bedtime     fish oil-omega-3 fatty acids 1000 MG capsule 1 g, Daily at bedtime     metoprolol succinate (TOPROL-XL) 50 MG 24 hr tablet      Patient taking differently: TAKE 1 TABLET AT BEDTIME, Reason: Free Text Sig Edit, Informant    Medications administered by patient during sleep study : No sleep medicine administered.  RESPIRATORY PARAMETERS Diagnostic Total AHI (/hr): 24.9 RDI (/hr): 29.0 OA Index (/hr): 0.4 CA Index (/hr): 0.0 REM AHI (/hr): 55.0 NREM  AHI (/hr): 22.2 Supine AHI (/hr): 48.5 Non-supine AHI (/hr): 12.01 Min O2 Sat (%): 78.00 Mean O2 (%): 91.55 Time below 88% (min): 16.7   Titration Optimal Pressure (cm): 12 AHI at Optimal Pressure (/hr): 2.6 Min O2 at Optimal Pressure (%): 91.0 Supine % at Optimal (%): 0 Sleep % at Optimal (%): 96    SLEEP ARCHITECTURE The recording time for the entire night was 398.5 minutes. During a baseline period of 178.4 minutes, the patient slept for 146.9 minutes in REM and nonREM, yielding a sleep efficiency of 82.4%. Sleep onset after lights out was 17.4 minutes with a REM latency of 107.5 minutes. The patient spent 9.87% of the night in stage N1 sleep, 81.96% in stage N2 sleep, 0.00% in stage N3 and 8.17% in REM. During the titration period of 219.3 minutes, the patient slept for 206.0 minutes in REM and nonREM, yielding a sleep efficiency of 94.0%. Sleep onset after CPAP initiation was 3.7 minutes with a REM latency of 53.5 minutes. The patient spent 6.81% of the night in stage N1 sleep, 59.22% in stage N2 sleep, 0.49% in stage N3 and 33.49% in REM.  CARDIAC DATA The 2 lead EKG demonstrated atrial fibrillation. The mean heart rate was 63.51 beats per minute. Other EKG findings include: None.  LEG MOVEMENT DATA The total Periodic Limb Movements of Sleep (PLMS) were 16. The PLMS index was 2.72 .  IMPRESSIONS - Moderate obstructive sleep apnea (AHI = 24.9/hour) overall, but severe sleep apnea during REM sleep (AHI 55/hour) . CPAP was initiated at 5 cm and was titrated up to 12 cm.  AHI at 12  cm was 2.6/h and there was still very soft intermittent snoring.  - No significant central sleep apnea occurred during the diagnostic portion of the study (CAI = 0.0/hour). - Significant oxygen desaturation on the diagnostic portion of the study to a nadir of 78.00%. - The patient snored with Moderate snoring volume during the diagnostic portion of the study. - The patient was in atrial fibrillation throughout the  duration of the study. - Clinically significant periodic limb movements did not occur during sleep.  DIAGNOSIS - Obstructive Sleep Apnea (327.23 [G47.33 ICD-10])  RECOMMENDATIONS - Recommend an initial trial of CPAP therapy at 13 cm H2O with heated humidification.  A X-Small size Resmed Full Face Mask AirFit F10 was used for the titration. - Avoid alcohol, sedatives and other CNS depressants that may worsen sleep apnea and disrupt normal sleep architecture. - Sleep hygiene should be reviewed to assess factors that may improve sleep quality. - Weight management (BMI 32) and regular exercise should be initiated or continued. - Recommend a download be obtained in 30 days and sleep clinic follow-up evaluation.   Troy Sine, MD, [place], American Board of Sleep Medicine  ELECTRONICALLY SIGNED ON:  04/12/2016, 11:39 PM [place] PH: [phone]   FX: [phone] [place]

## 2016-04-15 ENCOUNTER — Telehealth: Payer: Self-pay | Admitting: *Deleted

## 2016-04-15 ENCOUNTER — Telehealth: Payer: Self-pay | Admitting: Cardiovascular Disease

## 2016-04-15 NOTE — Telephone Encounter (Signed)
Lauralee Evener, CMA at 04/15/2016  2:54 PM       Status: Signed        Expand All Collapse All   Left message to return a call to discuss sleep study results.

## 2016-04-15 NOTE — Telephone Encounter (Signed)
Left message to return a call to discuss sleep study results. 

## 2016-04-15 NOTE — Telephone Encounter (Signed)
New Message   Pt called request a call back. States she missed a call from the triage nurse

## 2016-04-21 ENCOUNTER — Ambulatory Visit (INDEPENDENT_AMBULATORY_CARE_PROVIDER_SITE_OTHER): Payer: Commercial Managed Care - HMO | Admitting: Cardiovascular Disease

## 2016-04-21 VITALS — BP 163/84 | HR 64 | Ht 66.0 in | Wt 202.4 lb

## 2016-04-21 DIAGNOSIS — I4819 Other persistent atrial fibrillation: Secondary | ICD-10-CM

## 2016-04-21 DIAGNOSIS — I480 Paroxysmal atrial fibrillation: Secondary | ICD-10-CM | POA: Diagnosis not present

## 2016-04-21 DIAGNOSIS — I481 Persistent atrial fibrillation: Secondary | ICD-10-CM

## 2016-04-21 NOTE — Progress Notes (Signed)
Cardiology Office Note   Date:  04/21/2016   ID:  TEKLA MATOUSEK, DOB 1961-03-14, MRN [id number]  PCP:  Hoyt Koch, MD  Cardiologist:   Kathlyn Sacramento, MD   Chief Complaint  Patient presents with  . Follow-up    sleep study and heart monitor      History of Present Illness: Kayla Hopkins is a 55 y.o. female who presents for A follow-up visit regarding persistent atrial fibrillation and peripheral arterial disease.  She has no known CAD per cath back in 2009. She does have known history of peripheral arterial disease, persistent atrial fibrillation, hypertension and hyperlipidemia.  She is status post bilateral iliac stenting with covered stent on the right side for restenosis.  She had persistent atrial fibrillation recently and was started on anticoagulation. Cardioversion was attempted on May 11 but was not successful and restoring sinus rhythm. She was diagnosed with sleep apnea but has not been provided with CPAP yet . She was seen in the A. fib clinic. Class IC antiarrhythmic medication was considered but the patient wanted to wait until her sleep apnea is treated. She continues to have classic symptoms of sleep apnea as she wakes up in the middle of the night gasping for air. That's usually the only time where she feels palpitations. During the day, she doesn't seem to be significantly symptomatic from atrial fibrillation.   Past Medical History  Diagnosis Date  . PAF (paroxysmal atrial fibrillation) ([place])   . Hypertension   . Hyperlipidemia   . Tobacco abuse     stopped smoking, 2016  . Claudication ([place])        . Depression   . Anxiety   . Herpes     no outbreaks-has Valtrex to take PRN  . Peripheral vascular disease ([place]) 06/11/2009    Bilateral common iliac kissing stents (8X24 mm) . 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 She underwent successful balloon angioplasty.   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   . Shortness of breath dyspnea     patient states she has had lung test that came back normal  . Daily headache   . Migraine     "probably have 3/year; had one today; tried several medications-no relief" (02/11/2016)  . Arthritis     "knees, back" (02/11/2016)  . PTSD (post-traumatic stress disorder)     Past Surgical History  Procedure Laterality Date  . Iliac artery stent Bilateral 06/2009    common iliac kissing stents (8X24 mm)  . Lumbar disc surgery      spinal stenosis  . Knee arthroscopy Bilateral   . Abdominal angiogram N/A 05/11/2012    Procedure: ABDOMINAL ANGIOGRAM;  Surgeon: [name], MD;  Location: [facility] CATH LAB;  Service: Cardiovascular;  Laterality: N/A;  . Abdominal aortagram N/A 10/19/2012    Procedure: ABDOMINAL Maxcine Ham;  Surgeon: [name], MD;  Location: [place] CATH LAB;  Service: Cardiovascular;  Laterality: N/A;  . Abdominal aortagram N/A 05/10/2013    Procedure: ABDOMINAL Maxcine Ham;  Surgeon: [name], MD;  Location: [place] CATH LAB;  Service: Cardiovascular;  Laterality: N/A;  . Robotic assisted total hysterectomy with salpingectomy Bilateral 09/23/2015    Procedure: ROBOTIC ASSISTED TOTAL HYSTERECTOMY BILATERALSALPINGECTOMY AND OOPHORECTOMY;  Surgeon: [name], MD;  Location: [facility];  Service: Gynecology;  Laterality: Bilateral;  .  Vulvectomy N/A 09/23/2015    Procedure: WIDE EXCISION VULVECTOMY  WLE of vulvar VIN 2;  Surgeon: [name], MD;  Location: [facility];  Service: Gynecology;  Laterality: N/A;  . Back surgery    . Tubal ligation  1991  . Cardiac catheterization  2009    normal  . Cardioversion N/A 03/19/2016    Procedure: CARDIOVERSION;  Surgeon: Pixie Casino, MD;  Location: [facility] ENDOSCOPY;  Service: Cardiovascular;  Laterality: N/A;     Current Outpatient Prescriptions  Medication Sig Dispense Refill    . acetaminophen (TYLENOL) 325 MG tablet Take 2 tablets (650 mg total) by mouth every 6 (six) hours as needed for mild pain (or Fever >/= 101).    [name] apixaban (ELIQUIS) 5 MG TABS tablet Take 1 tablet (5 mg total) by mouth 2 (two) times daily. 180 tablet 3  . atorvastatin (LIPITOR) 40 MG tablet TAKE 1 TABLET EVERY DAY  AT  6PM (Patient taking differently: TAKE 1 TABLET EVERY DAY  AT bedtime) 90 tablet 3  . diltiazem (CARDIZEM CD) 180 MG 24 hr capsule Take 1 capsule (180 mg total) by mouth daily. 90 capsule 3  . esomeprazole (NEXIUM) 20 MG capsule Take 20 mg by mouth at bedtime.     . fish oil-omega-3 fatty acids 1000 MG capsule Take 1 g by mouth at bedtime.     . metoprolol succinate (TOPROL-XL) 50 MG 24 hr tablet TAKE 1 TABLET DAILY. TAKE WITH OR IMMEDIATELY FOLLOWING A MEAL. (Patient taking differently: TAKE 1 TABLET AT BEDTIME) 90 tablet 3   No current facility-administered medications for this visit.    Allergies:   Pradaxa and Zocor    Social History:  The patient  reports that she quit smoking about 5 months ago. Her smoking use included Cigarettes. She has a 3.7 pack-year smoking history. She has never used smokeless tobacco. She reports that she does not drink alcohol or use illicit drugs.   Family History:  The patient's family history includes Cancer in her maternal grandfather and paternal grandfather; Colon polyps in her sister; Heart attack in her father; Heart disease in her father; Hypertension in her brother and mother; Lung cancer in her mother; Ovarian cancer in her mother.    ROS:  Please see the history of present illness.   Otherwise, review of systems are positive for none.   All other systems are reviewed and negative.    PHYSICAL EXAM: VS:  BP 163/84 mmHg  Pulse 64  Ht 5\' 6"  (1.676 m)  Wt 202 lb 6.4 oz (91.808 kg)  BMI 32.68 kg/m2  LMP 11/09/2008 , BMI Body mass index is 32.68 kg/(m^2). GEN: Well nourished, well developed, in no acute distress HEENT: normal Neck:  no JVD, carotid bruits, or masses Cardiac: Irregularly irregular; no murmurs, rubs, or gallops,no edema  Respiratory:  clear to auscultation bilaterally, normal work of breathing GI: soft, nontender, nondistended, + BS MS: no deformity or atrophy Skin: warm and dry, no rash Neuro:  Strength and sensation are intact Psych: euthymic mood, full affect   EKG:  EKG is not ordered today.    Recent Labs: 02/10/2016: TSH 1.124 02/12/2016: Magnesium 2.0 03/13/2016: BUN 20; Creat 0.87; Hemoglobin 13.4; Platelets 222; Potassium 4.5; Sodium 139    Lipid Panel    Component Value Date/Time   CHOL 188 06/08/2012 1526   TRIG 474.0* 06/08/2012 1526   HDL 46.00 06/08/2012 1526   CHOLHDL 4 06/08/2012 1526   VLDL 94.8* 06/08/2012 1526   LDLCALC *  08/11/2008 0340    147        Total Cholesterol/HDL:CHD Risk Coronary Heart Disease Risk Table                     Men   Women  1/2 Average Risk   3.4   3.3   LDLDIRECT 77.8 06/08/2012 1526      Wt Readings from Last 3 Encounters:  04/21/16 202 lb 6.4 oz (91.808 kg)  04/07/16 199 lb (90.266 kg)  03/24/16 199 lb (90.266 kg)       ASSESSMENT AND PLAN:  1.  Persistent atrial fibrillation: Cardioversion was not successful in restoring sinus rhythm. Continue anticoagulation with Eliquis. Continue rate control with diltiazem and metoprolol. I suspect that her symptoms will significantly improve after treating her sleep apnea. Reevaluate symptoms after treating sleep apnea as she might require only rate control. I agree that class IC drugs are a good option if rhythm control is needed.  2.  sleep apnea: Will follow-up regarding providing her with CPAP.  3. Peripheral arterial disease: No claudication. Most recent noninvasive vascular evaluation in October 2016 showed patent iliac stents.  4. Essential hypertension: Blood pressure continues to be mildly elevated.  5. Hyperlipidemia: Continue treatment with atorvastatin with a target LDL of less than  70.    Disposition:   FU with me in 6 months  Signed,  Kathlyn Sacramento, MD  04/21/2016 5:13 PM    [place]

## 2016-04-21 NOTE — Patient Instructions (Signed)

## 2016-04-23 ENCOUNTER — Other Ambulatory Visit: Payer: Self-pay | Admitting: *Deleted

## 2016-04-23 DIAGNOSIS — G4733 Obstructive sleep apnea (adult) (pediatric): Secondary | ICD-10-CM

## 2016-05-04 ENCOUNTER — Telehealth: Payer: Self-pay | Admitting: Cardiovascular Disease

## 2016-05-04 NOTE — Telephone Encounter (Signed)
SPOKE TO PATIENT INFORMED PATIENT WILL GIVE INFORMATION TO WANDA ,CMA-   PATIENT HAD AN APPT. WITH DR ARIDA ON 04/21/16-  SLEEP STUDY RESULT GIVEN AT THAT TIME. PATIENT AWARE SOMEONE WILL CONTACT HER.

## 2016-05-04 NOTE — Telephone Encounter (Signed)
Pt says she still have not gotten her sleep machine and have not heard anything.

## 2016-05-05 ENCOUNTER — Inpatient Hospital Stay (HOSPITAL_COMMUNITY)
Admission: RE | Admit: 2016-05-05 | Payer: Commercial Managed Care - HMO | Source: Ambulatory Visit | Admitting: Nurse Practitioner

## 2016-05-06 ENCOUNTER — Telehealth: Payer: Self-pay | Admitting: *Deleted

## 2016-05-06 NOTE — Telephone Encounter (Signed)
CPAP referral sent to [facility].

## 2016-05-15 ENCOUNTER — Telehealth: Payer: Self-pay | Admitting: Cardiovascular Disease

## 2016-05-15 NOTE — Telephone Encounter (Signed)
Returned call to patient.Left message on personal voice mail I will send message to Kayla Hopkins she is out of office today.

## 2016-05-15 NOTE — Telephone Encounter (Signed)
Pt calling to fu with the medical supply fax Wanda sent 05-06-16 to [facility]-pt hasn't heard anything from them--pls advise

## 2016-05-20 NOTE — Telephone Encounter (Signed)
Spoke with Kayla Hopkins @ [facility]. She informed me that she has attempted to contact the patient on 6/30 and 7/5. She needed to get the patient's insurance ID #. I provided her that information. She states that she will start the process and notify the patient.

## 2016-05-21 NOTE — Telephone Encounter (Signed)
Called and left message call returned to discuss sleep study results.

## 2016-05-28 DIAGNOSIS — G4733 Obstructive sleep apnea (adult) (pediatric): Secondary | ICD-10-CM | POA: Diagnosis not present

## 2016-06-23 ENCOUNTER — Ambulatory Visit (HOSPITAL_COMMUNITY)
Admission: RE | Admit: 2016-06-23 | Discharge: 2016-06-23 | Disposition: A | Discharge status: Home / Self Care | Payer: Commercial Managed Care - HMO | Source: Ambulatory Visit | Attending: Nurse Practitioner | Admitting: Nurse Practitioner

## 2016-06-23 ENCOUNTER — Encounter (HOSPITAL_COMMUNITY): Payer: Self-pay | Admitting: Nurse Practitioner

## 2016-06-23 VITALS — BP 126/82 | HR 63 | Ht 66.0 in | Wt 207.0 lb

## 2016-06-23 DIAGNOSIS — I10 Essential (primary) hypertension: Secondary | ICD-10-CM | POA: Diagnosis not present

## 2016-06-23 DIAGNOSIS — I4819 Other persistent atrial fibrillation: Secondary | ICD-10-CM

## 2016-06-23 DIAGNOSIS — Z8249 Family history of ischemic heart disease and other diseases of the circulatory system: Secondary | ICD-10-CM | POA: Diagnosis not present

## 2016-06-23 DIAGNOSIS — F17290 Nicotine dependence, other tobacco product, uncomplicated: Secondary | ICD-10-CM | POA: Insufficient documentation

## 2016-06-23 DIAGNOSIS — Z7901 Long term (current) use of anticoagulants: Secondary | ICD-10-CM | POA: Insufficient documentation

## 2016-06-23 DIAGNOSIS — I481 Persistent atrial fibrillation: Secondary | ICD-10-CM | POA: Diagnosis not present

## 2016-06-23 DIAGNOSIS — E785 Hyperlipidemia, unspecified: Secondary | ICD-10-CM | POA: Insufficient documentation

## 2016-06-23 DIAGNOSIS — Z888 Allergy status to other drugs, medicaments and biological substances status: Secondary | ICD-10-CM | POA: Diagnosis not present

## 2016-06-23 DIAGNOSIS — Z79899 Other long term (current) drug therapy: Secondary | ICD-10-CM | POA: Insufficient documentation

## 2016-06-23 MED ORDER — FLECAINIDE ACETATE 50 MG PO TABS: 50.0000 mg | ORAL_TABLET | Freq: Two times a day (BID) | ORAL | 3 refills | Status: DC

## 2016-06-23 MED ORDER — METOPROLOL SUCCINATE ER 50 MG PO TB24: ORAL_TABLET | ORAL | 3 refills | Status: DC

## 2016-06-23 NOTE — Patient Instructions (Signed)
Your physician has recommended you make the following change in your medication:  1)Decrease metoprolol to 25mg  once a day (1/2 tablet of the 50mg ) 2)start flecainide 50mg  twice a day

## 2016-06-23 NOTE — Progress Notes (Signed)
Patient ID: Kayla Hopkins, female   DOB: 11-Jul-1961, 55 y.o.   MRN: [id number]     Primary Care Physician: Hoyt Koch, MD Referring Physician: Dr. Tommye Standard Kayla Hopkins is a 55 y.o. female with a h/o PAF , per pt, persistent since last November. She was seen in the afib clinic 6/27, recently failed cardioversion, and was in the afib clinic for further options. She states that she was pending a sleep study at the end of May. She is not aware of being in afib, does not cause her undo shortness of breath or fatigue during the day. Has her symptoms at night with poor sleeping and waking up with shortness of breath,feeling her heart racing. Her husband does confirm that she does snore and has apnea episodes. Most of her symptoms at night may be driven by sleep apnea. Echo showed EF of 55-60%, with Left atrium at 43 mm. Myoview, 02/12/16, showed low risk scan. She was pending a sleep study and wanted to wait until she had sleep apnea treated before she entertained any antiarrythmic drugs. She does not drink alcohol, no tobacco, is obese and does not exercise.  She returns today, 8/15, and is ready to try to restore SR. She has been on her cpap for two weeks now but is feeling more tired, fatigued, but sleeping better. Records reviewed and show a stress Myoview without ischemia in April of this year, echo normal EF, labs show normal renal function. [place] EKG's in SR reviewed and do not show any first degree of AV block, normal QT. Discussed with pt to use flecainide to restore SR and she is ready to try this. She has not missed any doses of apixaban.   Today, she denies symptoms of palpitations, chest pain, shortness of breath, orthopnea, PND, lower extremity edema, dizziness, presyncope, syncope, or neurologic sequela. The patient is tolerating medications without difficulties and is otherwise without complaint today.   Past Medical History:  Diagnosis Date  . Anxiety   . Arthritis    "knees, back" (02/11/2016)  . Claudication ([place])       . Daily headache   . Depression   . Herpes    no outbreaks-has Valtrex to take PRN  . Hyperlipidemia   . Hypertension   . Migraine    "probably have 3/year; had one today; tried several medications-no relief" (02/11/2016)  . PAF (paroxysmal atrial fibrillation) ([place])   . Peripheral vascular disease ([place]) 06/11/2009   Bilateral common iliac kissing stents (8X24 mm) . 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 She underwent successful balloon angioplasty.  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   [name] PTSD (post-traumatic stress disorder)   . Shortness of breath dyspnea    patient states she has had lung test that came back normal  . Tobacco abuse    stopped smoking, 2016   Past Surgical History:  Procedure Laterality Date  . ABDOMINAL ANGIOGRAM N/A 05/11/2012   Procedure: ABDOMINAL ANGIOGRAM;  Surgeon: [name], MD;  Location: [facility] CATH LAB;  Service: Cardiovascular;  Laterality: N/A;  . ABDOMINAL AORTAGRAM N/A 10/19/2012   Procedure: ABDOMINAL Maxcine Ham;  Surgeon: [name], MD;  Location: [place] CATH LAB;  Service: Cardiovascular;  Laterality: N/A;  . ABDOMINAL AORTAGRAM N/A 05/10/2013   Procedure: ABDOMINAL Maxcine Ham;  Surgeon: [name], MD;  Location: [place] CATH LAB;  Service: Cardiovascular;  Laterality: N/A;  . BACK SURGERY    . CARDIAC CATHETERIZATION  2009   normal  . CARDIOVERSION N/A 03/19/2016   Procedure: CARDIOVERSION;  Surgeon: Pixie Casino, MD;  Location: [facility] ENDOSCOPY;  Service: Cardiovascular;  Laterality: N/A;  . ILIAC ARTERY STENT Bilateral 06/2009   common iliac kissing stents (8X24 mm)  . KNEE ARTHROSCOPY Bilateral   . LUMBAR DISC SURGERY     spinal stenosis  . ROBOTIC ASSISTED TOTAL HYSTERECTOMY WITH SALPINGECTOMY Bilateral 09/23/2015    Procedure: ROBOTIC ASSISTED TOTAL HYSTERECTOMY BILATERALSALPINGECTOMY AND OOPHORECTOMY;  Surgeon: [name], MD;  Location: [facility];  Service: Gynecology;  Laterality: Bilateral;  . TUBAL LIGATION  1991  . VULVECTOMY N/A 09/23/2015   Procedure: WIDE EXCISION VULVECTOMY  WLE of vulvar VIN 2;  Surgeon: [name], MD;  Location: [facility];  Service: Gynecology;  Laterality: N/A;    Current Outpatient Prescriptions  Medication Sig Dispense Refill  . acetaminophen (TYLENOL) 325 MG tablet Take 2 tablets (650 mg total) by mouth every 6 (six) hours as needed for mild pain (or Fever >/= 101).    [name] apixaban (ELIQUIS) 5 MG TABS tablet Take 1 tablet (5 mg total) by mouth 2 (two) times daily. 180 tablet 3  . atorvastatin (LIPITOR) 40 MG tablet TAKE 1 TABLET EVERY DAY  AT  6PM (Patient taking differently: TAKE 1 TABLET EVERY DAY  AT bedtime) 90 tablet 3  . diltiazem (CARDIZEM CD) 180 MG 24 hr capsule Take 1 capsule (180 mg total) by mouth daily. 90 capsule 3  . esomeprazole (NEXIUM) 20 MG capsule Take 20 mg by mouth at bedtime.     . metoprolol succinate (TOPROL-XL) 50 MG 24 hr tablet Take 1/2 tablet (25mg ) by mouth once a day 90 tablet 3  . fish oil-omega-3 fatty acids 1000 MG capsule Take 1 g by mouth at bedtime.     . flecainide (TAMBOCOR) 50 MG tablet Take 1 tablet (50 mg total) by mouth 2 (two) times daily. 60 tablet 3   No current facility-administered medications for this encounter.     Allergies  Allergen Reactions  . Pradaxa [Dabigatran Etexilate Mesylate]     Gastritis   . Zocor [Simvastatin]     Leg pains     Social History   Social History  . Marital status: Single    Spouse name: N/A  . Number of children: 3  . Years of education: N/A   Occupational History  . Not on file.   Social History Main Topics  . Smoking status: Former Smoker    Packs/day: 0.10    Years: 37.00    Types: Cigarettes    Quit date: 11/10/2015  . Smokeless tobacco: Never Used     Comment: 02/11/2016  "using electronic cigarettes"  . Alcohol use No  . Drug use: No  . Sexual activity: Yes    Partners: Male   Other Topics Concern  . Not on file   Social History Narrative  . No narrative on file    Family History  Problem Relation Age of Onset  . Hypertension Mother   . Heart disease Father   . Heart attack Father   . Hypertension Brother   . Ovarian cancer Mother   . Lung cancer Mother   . Colon polyps Sister   . Cancer Maternal Grandfather     lung  . Cancer Paternal Grandfather     throat  ROS- All systems are reviewed and negative except as per the HPI above  Physical Exam: Vitals:   06/23/16 1049  BP: 126/82  Pulse: 63  Weight: 207 lb (93.9 kg)  Height: 5\' 6"  (1.676 m)    GEN- The patient is well appearing, alert and oriented x 3 today.   Head- normocephalic, atraumatic Eyes-  Sclera clear, conjunctiva pink Ears- hearing intact Oropharynx- clear Neck- supple, no JVP Lymph- no cervical lymphadenopathy Lungs- Clear to ausculation bilaterally, normal work of breathing Heart- irregular rate and rhythm, no murmurs, rubs or gallops, PMI not laterally displaced GI- soft, NT, ND, + BS Extremities- no clubbing, cyanosis, or edema MS- no significant deformity or atrophy Skin- no rash or lesion Psych- euthymic mood, full affect Neuro- strength and sensation are intact  EKG- Afib at 63 bpm, qrs int 82 ms, qtc 386 ms Epic records reviewed  Assessment and Plan: 1. Persistent symptomatic afib Explored options to restore SR Without any known CAD or reduced  EF, would recommend trying flecainide with  repeat cardioversion, if drug did not convert  Start flecainide 50 mg bid and return in one week for EKG review. If drug has not converted pt, will increase to 100 mg bid and bring back to office in one week. If still fails to convert pt, she will be cardioverted on drug Reduce toprol xl  50 mg to 25 mg a day and plan to stop prior to cardioversion to help offset  brady when return to SR. Will continue Cardizem. Continue apixaban with CHA2DS2VASc score of at least 3, without any missed doses. Continue cpap  F/u in afib clinic with EKG in one week   Butch Penny C. Wilena Tyndall, [facility] [address] [place], [place] [address] [phone]

## 2016-06-28 DIAGNOSIS — G4733 Obstructive sleep apnea (adult) (pediatric): Secondary | ICD-10-CM | POA: Diagnosis not present

## 2016-07-01 ENCOUNTER — Ambulatory Visit (HOSPITAL_COMMUNITY)
Admission: RE | Admit: 2016-07-01 | Discharge: 2016-07-01 | Disposition: A | Discharge status: Home / Self Care | Payer: Commercial Managed Care - HMO | Source: Ambulatory Visit | Attending: Nurse Practitioner | Admitting: Nurse Practitioner

## 2016-07-01 DIAGNOSIS — I4891 Unspecified atrial fibrillation: Secondary | ICD-10-CM | POA: Diagnosis not present

## 2016-07-01 DIAGNOSIS — Z79899 Other long term (current) drug therapy: Secondary | ICD-10-CM | POA: Insufficient documentation

## 2016-07-01 DIAGNOSIS — I480 Paroxysmal atrial fibrillation: Secondary | ICD-10-CM

## 2016-07-01 MED ORDER — FLECAINIDE ACETATE 50 MG PO TABS: 100.0000 mg | ORAL_TABLET | Freq: Two times a day (BID) | ORAL | 3 refills | Status: DC

## 2016-07-01 NOTE — Progress Notes (Addendum)
Pt in for EKG after starting flecainide.  Pt BP today is 148/94; Roderic Palau, NP to review EKG  Pt feels ok on flecainide. Has been on 50 mg bid x one week. Remains in afib with v rate 78 ms, qrs int 94 ms, qtc 414 ms. No significant change since initiation of flecainide.Will  Increase to 100 mg bid and plan to bring back in one week for to get scheduled for cardioversion, if remains in afib.

## 2016-07-01 NOTE — Patient Instructions (Signed)
Your physician has recommended you make the following change in your medication:  Increase your flecainide to 100 mg twice a day  Follow up with [place] in 1 week

## 2016-07-08 ENCOUNTER — Ambulatory Visit (HOSPITAL_COMMUNITY)
Admission: RE | Admit: 2016-07-08 | Discharge: 2016-07-08 | Disposition: A | Discharge status: Home / Self Care | Payer: Commercial Managed Care - HMO | Source: Ambulatory Visit | Attending: Nurse Practitioner | Admitting: Nurse Practitioner

## 2016-07-08 ENCOUNTER — Encounter (HOSPITAL_COMMUNITY): Payer: Self-pay | Admitting: Nurse Practitioner

## 2016-07-08 VITALS — BP 124/78 | Ht 66.0 in | Wt 205.8 lb

## 2016-07-08 DIAGNOSIS — Z95820 Peripheral vascular angioplasty status with implants and grafts: Secondary | ICD-10-CM | POA: Diagnosis not present

## 2016-07-08 DIAGNOSIS — Z888 Allergy status to other drugs, medicaments and biological substances status: Secondary | ICD-10-CM | POA: Diagnosis not present

## 2016-07-08 DIAGNOSIS — Z8249 Family history of ischemic heart disease and other diseases of the circulatory system: Secondary | ICD-10-CM | POA: Diagnosis not present

## 2016-07-08 DIAGNOSIS — Z79899 Other long term (current) drug therapy: Secondary | ICD-10-CM | POA: Insufficient documentation

## 2016-07-08 DIAGNOSIS — I10 Essential (primary) hypertension: Secondary | ICD-10-CM | POA: Diagnosis not present

## 2016-07-08 DIAGNOSIS — E785 Hyperlipidemia, unspecified: Secondary | ICD-10-CM | POA: Insufficient documentation

## 2016-07-08 DIAGNOSIS — Z7901 Long term (current) use of anticoagulants: Secondary | ICD-10-CM | POA: Diagnosis not present

## 2016-07-08 DIAGNOSIS — M1990 Unspecified osteoarthritis, unspecified site: Secondary | ICD-10-CM | POA: Diagnosis not present

## 2016-07-08 DIAGNOSIS — I481 Persistent atrial fibrillation: Secondary | ICD-10-CM | POA: Insufficient documentation

## 2016-07-08 DIAGNOSIS — Z87891 Personal history of nicotine dependence: Secondary | ICD-10-CM | POA: Diagnosis not present

## 2016-07-08 DIAGNOSIS — Z6833 Body mass index (BMI) 33.0-33.9, adult: Secondary | ICD-10-CM | POA: Diagnosis not present

## 2016-07-08 DIAGNOSIS — E669 Obesity, unspecified: Secondary | ICD-10-CM | POA: Insufficient documentation

## 2016-07-08 DIAGNOSIS — I4819 Other persistent atrial fibrillation: Secondary | ICD-10-CM

## 2016-07-08 DIAGNOSIS — I739 Peripheral vascular disease, unspecified: Secondary | ICD-10-CM | POA: Insufficient documentation

## 2016-07-08 LAB — BASIC METABOLIC PANEL
Anion gap: 6 (ref 5–15)
BUN: 23 mg/dL — AB (ref 6–20)
CHLORIDE: 103 mmol/L (ref 101–111)
CO2: 29 mmol/L (ref 22–32)
CREATININE: 0.87 mg/dL (ref 0.44–1.00)
Calcium: 9.6 mg/dL (ref 8.9–10.3)
GFR calc Af Amer: >60 mL/min (ref >60)
GFR calc non Af Amer: >60 mL/min (ref >60)
Glucose, Bld: 104 mg/dL — ABNORMAL HIGH (ref 65–99)
POTASSIUM: 4.7 mmol/L (ref 3.5–5.1)
Sodium: 138 mmol/L (ref 135–145)

## 2016-07-08 LAB — CBC
HEMATOCRIT: 47.3 % — AB (ref 36.0–46.0)
HEMOGLOBIN: 15.2 g/dL — AB (ref 12.0–15.0)
MCH: 28.7 pg (ref 26.0–34.0)
MCHC: 32.1 g/dL (ref 30.0–36.0)
MCV: 89.2 fL (ref 78.0–100.0)
Platelets: 296 10*3/uL (ref 150–400)
RBC: 5.3 MIL/uL — AB (ref 3.87–5.11)
RDW: 14.7 % (ref 11.5–15.5)
WBC: 7.3 10*3/uL (ref 4.0–10.5)

## 2016-07-08 NOTE — Patient Instructions (Signed)
Cardioversion scheduled for Friday, September 1st  - Arrive at the [facility] and go to admitting at 1230pm  -Do not eat or drink anything after midnight the night prior to your procedure.  - Take all your medication with a sip of water prior to arrival.  - You will not be able to drive home after your procedure.

## 2016-07-08 NOTE — Progress Notes (Signed)
Patient ID: Kayla Hopkins, female   DOB: September 30, 1961, 55 y.o.   MRN: [id number]     Primary Care Physician: Hoyt Koch, MD Referring Physician: Dr. Tommye Standard Kayla Hopkins is a 55 y.o. female with a h/o PAF , per pt, persistent since last November. She was seen in the afib clinic 6/27, recently failed cardioversion, and was in the afib clinic for further options. She states that she was pending a sleep study at the end of May. She is not aware of being in afib, does not cause her undo shortness of breath or fatigue during the day. Has her symptoms at night with poor sleeping and waking up with shortness of breath,feeling her heart racing. Her husband does confirm that she does snore and has apnea episodes. Most of her symptoms at night may be driven by sleep apnea. Echo showed EF of 55-60%, with Left atrium at 43 mm. Myoview, 02/12/16, showed low risk scan. She was pending a sleep study and wanted to wait until she had sleep apnea treated before she entertained any antiarrythmic drugs. She does not drink alcohol, no tobacco, is obese and does not exercise.  She returns today, 8/15, and is ready to try to restore SR. She has been on her cpap for two weeks now but is feeling more tired, fatigued, but sleeping better. Records reviewed and show a stress Myoview without ischemia in April of this year, echo normal EF, labs show normal renal function. [place] EKG's in SR reviewed and do not show any first degree of AV block, normal QT. Discussed with pt to use flecainide to restore SR and she is ready to try this. She has not missed any doses of apixaban.   Returns 8/30 and has been on flecainide 100 mg bid for last week and has not cardioverted pt and she will now be scheduled for cardioversion. No missed doses of eliquis for at least the last three weeks. Tolerating well, had a migraine on Sunday, has h/o same, but has not had migraine for years, not sure if related to drug or not.Feels well  today.  Today, she denies symptoms of palpitations, chest pain, shortness of breath, orthopnea, PND, lower extremity edema, dizziness, presyncope, syncope, or neurologic sequela. The patient is tolerating medications without difficulties and is otherwise without complaint today.   Past Medical History:  Diagnosis Date  . Anxiety   . Arthritis    "knees, back" (02/11/2016)  . Claudication ([place])       . Daily headache   . Depression   . Herpes    no outbreaks-has Valtrex to take PRN  . Hyperlipidemia   . Hypertension   . Migraine    "probably have 3/year; had one today; tried several medications-no relief" (02/11/2016)  . PAF (paroxysmal atrial fibrillation) ([place])   . Peripheral vascular disease ([place]) 06/11/2009   Bilateral common iliac kissing stents (8X24 mm) . 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 She underwent successful balloon angioplasty.  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   [name] PTSD (post-traumatic stress disorder)   . Shortness of breath dyspnea    patient states she has had lung test that came back normal  . Tobacco abuse    stopped smoking, 2016   Past Surgical History:  Procedure Laterality Date  . ABDOMINAL ANGIOGRAM N/A 05/11/2012  Procedure: ABDOMINAL ANGIOGRAM;  Surgeon: [name], MD;  Location: [facility] CATH LAB;  Service: Cardiovascular;  Laterality: N/A;  . ABDOMINAL AORTAGRAM N/A 10/19/2012   Procedure: ABDOMINAL Maxcine Ham;  Surgeon: [name], MD;  Location: [facility] CATH LAB;  Service: Cardiovascular;  Laterality: N/A;  . ABDOMINAL AORTAGRAM N/A 05/10/2013   Procedure: ABDOMINAL Maxcine Ham;  Surgeon: [name], MD;  Location: [facility] CATH LAB;  Service: Cardiovascular;  Laterality: N/A;  . BACK SURGERY    . CARDIAC CATHETERIZATION  2009   normal  . CARDIOVERSION N/A 03/19/2016   Procedure:  CARDIOVERSION;  Surgeon: Pixie Casino, MD;  Location: [facility] ENDOSCOPY;  Service: Cardiovascular;  Laterality: N/A;  . ILIAC ARTERY STENT Bilateral 06/2009   common iliac kissing stents (8X24 mm)  . KNEE ARTHROSCOPY Bilateral   . LUMBAR DISC SURGERY     spinal stenosis  . ROBOTIC ASSISTED TOTAL HYSTERECTOMY WITH SALPINGECTOMY Bilateral 09/23/2015   Procedure: ROBOTIC ASSISTED TOTAL HYSTERECTOMY BILATERALSALPINGECTOMY AND OOPHORECTOMY;  Surgeon: [name], MD;  Location: [facility] ORS;  Service: Gynecology;  Laterality: Bilateral;  . TUBAL LIGATION  1991  . VULVECTOMY N/A 09/23/2015   Procedure: WIDE EXCISION VULVECTOMY  WLE of vulvar VIN 2;  Surgeon: [name], MD;  Location: [facility];  Service: Gynecology;  Laterality: N/A;    Current Outpatient Prescriptions  Medication Sig Dispense Refill  . acetaminophen (TYLENOL) 325 MG tablet Take 2 tablets (650 mg total) by mouth every 6 (six) hours as needed for mild pain (or Fever >/= 101).    [name] apixaban (ELIQUIS) 5 MG TABS tablet Take 1 tablet (5 mg total) by mouth 2 (two) times daily. 180 tablet 3  . atorvastatin (LIPITOR) 40 MG tablet TAKE 1 TABLET EVERY DAY  AT  6PM (Patient taking differently: TAKE 1 TABLET EVERY DAY  AT bedtime) 90 tablet 3  . diltiazem (CARDIZEM CD) 180 MG 24 hr capsule Take 1 capsule (180 mg total) by mouth daily. 90 capsule 3  . esomeprazole (NEXIUM) 20 MG capsule Take 20 mg by mouth at bedtime.     . fish oil-omega-3 fatty acids 1000 MG capsule Take 1 g by mouth at bedtime.     . flecainide (TAMBOCOR) 50 MG tablet Take 2 tablets (100 mg total) by mouth 2 (two) times daily. 60 tablet 3  . metoprolol succinate (TOPROL-XL) 50 MG 24 hr tablet Take 1/2 tablet (25mg ) by mouth once a day 90 tablet 3   No current facility-administered medications for this encounter.     Allergies  Allergen Reactions  . Pradaxa [Dabigatran Etexilate Mesylate]     Gastritis   . Zocor [Simvastatin]     Leg pains     Social History   Social  History  . Marital status: Single    Spouse name: N/A  . Number of children: 3  . Years of education: N/A   Occupational History  . Not on file.   Social History Main Topics  . Smoking status: Former Smoker    Packs/day: 0.10    Years: 37.00    Types: Cigarettes    Quit date: 11/10/2015  . Smokeless tobacco: Never Used     Comment: 02/11/2016 "using electronic cigarettes"  . Alcohol use No  . Drug use: No  . Sexual activity: Yes    Partners: Male   Other Topics Concern  . Not on file   Social History Narrative  . No narrative on file    Family History  Problem Relation Age  of Onset  . Hypertension Mother   . Heart disease Father   . Heart attack Father   . Hypertension Brother   . Ovarian cancer Mother   . Lung cancer Mother   . Colon polyps Sister   . Cancer Maternal Grandfather     lung  . Cancer Paternal Grandfather     throat     ROS- All systems are reviewed and negative except as per the HPI above  Physical Exam: Vitals:   07/08/16 1135  BP: 124/78  Weight: 205 lb 12.8 oz (93.4 kg)  Height: 5\' 6"  (1.676 m)    GEN- The patient is well appearing, alert and oriented x 3 today.   Head- normocephalic, atraumatic Eyes-  Sclera clear, conjunctiva pink Ears- hearing intact Oropharynx- clear Neck- supple, no JVP Lymph- no cervical lymphadenopathy Lungs- Clear to ausculation bilaterally, normal work of breathing Heart- irregular rate and rhythm, no murmurs, rubs or gallops, PMI not laterally displaced GI- soft, NT, ND, + BS Extremities- no clubbing, cyanosis, or edema MS- no significant deformity or atrophy Skin- no rash or lesion Psych- euthymic mood, full affect Neuro- strength and sensation are intact  EKG- Afib at 88 bpm, qrs int 98 ms, qtc 464 ms Epic records reviewed  Assessment and Plan: 1. Persistent symptomatic afib Previously failed cardioversion without aid of antiarrythmis's Explored options to restore SR Without any known CAD or  reduced  EF, recommended  flecainide with  repeat cardioversion, if drug did not convert On been on flecainide x 2 weeks starting at 50 mg bid increasing to 100 mg bid without chemical cardioversion Set up for DCCV for 9/1 NO MISSED DOSES OF ELIQIUS  Continue flecainide 100 bid  Hold Toprol xl  25 mg day of cardioversion Will continue Cardizem 180 mg qd Continue apixaban with [id number] score of at least 3  Continue cpap Bmet/cbc today  F/u in afib clinic one week s/p DCCV   Butch Penny C. Matheson [place], [facility] [address] [place],  [address] [phone]

## 2016-07-10 ENCOUNTER — Encounter (HOSPITAL_COMMUNITY): Payer: Self-pay | Admitting: *Deleted

## 2016-07-10 ENCOUNTER — Encounter (HOSPITAL_COMMUNITY)
Admission: RE | Disposition: A | Discharge status: Home / Self Care | Payer: Self-pay | Source: Ambulatory Visit | Attending: Internal Medicine

## 2016-07-10 ENCOUNTER — Ambulatory Visit (HOSPITAL_COMMUNITY)
Admission: RE | Admit: 2016-07-10 | Discharge: 2016-07-10 | Disposition: A | Discharge status: Home / Self Care | Payer: Commercial Managed Care - HMO | Source: Ambulatory Visit | Attending: Internal Medicine | Admitting: Internal Medicine

## 2016-07-10 ENCOUNTER — Other Ambulatory Visit (HOSPITAL_COMMUNITY): Payer: Self-pay | Admitting: *Deleted

## 2016-07-10 ENCOUNTER — Ambulatory Visit (HOSPITAL_COMMUNITY): Payer: Commercial Managed Care - HMO | Admitting: Certified Registered Nurse Anesthetist

## 2016-07-10 DIAGNOSIS — Z6833 Body mass index (BMI) 33.0-33.9, adult: Secondary | ICD-10-CM | POA: Insufficient documentation

## 2016-07-10 DIAGNOSIS — I4891 Unspecified atrial fibrillation: Secondary | ICD-10-CM | POA: Diagnosis not present

## 2016-07-10 DIAGNOSIS — I739 Peripheral vascular disease, unspecified: Secondary | ICD-10-CM | POA: Insufficient documentation

## 2016-07-10 DIAGNOSIS — Z87891 Personal history of nicotine dependence: Secondary | ICD-10-CM | POA: Diagnosis not present

## 2016-07-10 DIAGNOSIS — I10 Essential (primary) hypertension: Secondary | ICD-10-CM | POA: Insufficient documentation

## 2016-07-10 DIAGNOSIS — E785 Hyperlipidemia, unspecified: Secondary | ICD-10-CM | POA: Diagnosis not present

## 2016-07-10 DIAGNOSIS — I481 Persistent atrial fibrillation: Secondary | ICD-10-CM | POA: Diagnosis not present

## 2016-07-10 HISTORY — PX: CARDIOVERSION: SHX1299

## 2016-07-10 SURGERY — CARDIOVERSION
Anesthesia: General

## 2016-07-10 MED ORDER — LIDOCAINE HCL (CARDIAC) 20 MG/ML IV SOLN
INTRAVENOUS | Status: DC | PRN
  Administered 2016-07-10: 60 mg via INTRATRACHEAL

## 2016-07-10 MED ORDER — SODIUM CHLORIDE 0.9 % IV SOLN
INTRAVENOUS | Status: DC
  Administered 2016-07-10: 13:00:00 via INTRAVENOUS

## 2016-07-10 MED ORDER — PROPOFOL 10 MG/ML IV BOLUS
INTRAVENOUS | Status: DC | PRN
  Administered 2016-07-10: 150 mg via INTRAVENOUS
  Administered 2016-07-10: 100 mg via INTRAVENOUS

## 2016-07-10 NOTE — Discharge Instructions (Signed)
Electrical Cardioversion, Care After °Refer to this sheet in the next few weeks. These instructions provide you with information on caring for yourself after your procedure. Your health care provider may also give you more specific instructions. Your treatment has been planned according to current medical practices, but problems sometimes occur. Call your health care provider if you have any problems or questions after your procedure. °WHAT TO EXPECT AFTER THE PROCEDURE °After your procedure, it is typical to have the following sensations: °· Some redness on the skin where the shocks were delivered. If this is tender, a sunburn lotion or hydrocortisone cream may help. °· Possible return of an abnormal heart rhythm within hours or days after the procedure. °HOME CARE INSTRUCTIONS °· Take medicines only as directed by your health care provider. Be sure you understand how and when to take your medicine. °· Learn how to feel your pulse and check it often. °· Limit your activity for 48 hours after the procedure or as directed by your health care provider. °· Avoid or minimize caffeine and other stimulants as directed by your health care provider. °SEEK MEDICAL CARE IF: °· You feel like your heart is beating too fast or your pulse is not regular. °· You have any questions about your medicines. °· You have bleeding that will not stop. °SEEK IMMEDIATE MEDICAL CARE IF: °· You are dizzy or feel faint. °· It is hard to breathe or you feel short of breath. °· There is a change in discomfort in your chest. °· Your speech is slurred or you have trouble moving an arm or leg on one side of your body. °· You get a serious muscle cramp that does not go away. °· Your fingers or toes turn cold or blue. °  °This information is not intended to replace advice given to you by your health care provider. Make sure you discuss any questions you have with your health care provider. °  °Document Released: 08/16/2013 Document Revised: 11/16/2014  Document Reviewed: 08/16/2013 °Elsevier Interactive Patient Education ©2016 [employer] ° °

## 2016-07-10 NOTE — CV Procedure (Signed)
    CARDIOVERSION NOTE  Procedure: Electrical Cardioversion Indications:  Atrial Fibrillation  Procedure Details:  Consent: Risks of procedure as well as the alternatives and risks of each were explained to the (patient/caregiver).  Consent for procedure obtained.  Time Out: Verified patient identification, verified procedure, site/side was marked, verified correct patient position, special equipment/implants available, medications/allergies/relevent history reviewed, required imaging and test results available.  Performed  Patient placed on cardiac monitor, pulse oximetry, supplemental oxygen as necessary.  Sedation given: Propofol per anesthesia Pacer pads placed anterior and posterior chest.  Cardioverted 2 time(s).  Cardioverted at 150J and 200J biphasic.  Impression: Findings: Post procedure EKG shows: NSR Complications: None Patient did tolerate procedure well.  Plan: 1. Successful DCCV with 2 shocks to NSR. 2. Continue flecainide and Eliquis 3. Follow-up with Roderic Palau, NP in the a-fib clinic  Time Spent Directly with the Patient:  30 minutes   Pixie Casino, MD, [facility] Attending Cardiologist [place] 07/10/2016, 1:52 PM

## 2016-07-10 NOTE — Progress Notes (Signed)
Roderic Palau NP recommended stopping metoprolol and continuing only Cardizem daily with HR in 60s in NSR. Sonia Baller with Endoscopy notified and she will update patient's AVS prior to arrival. Will remove from medication list.

## 2016-07-10 NOTE — Anesthesia Postprocedure Evaluation (Signed)
Anesthesia Post Note  Patient: MALANA ABDELFATTAH  Procedure(s) Performed: Procedure(s) (LRB): CARDIOVERSION (N/A)  Patient location during evaluation: PACU Anesthesia Type: General Level of consciousness: awake and alert Pain management: pain level controlled Vital Signs Assessment: post-procedure vital signs reviewed and stable Respiratory status: spontaneous breathing, nonlabored ventilation, respiratory function stable and patient connected to nasal cannula oxygen Cardiovascular status: blood pressure returned to baseline and stable Postop Assessment: no signs of nausea or vomiting Anesthetic complications: no    Last Vitals:  Vitals:   07/10/16 1253 07/10/16 1355  BP: (!) 144/79 111/72  Pulse: 73 63  Resp: 10 13  Temp: 36.6 C     Last Pain:  Vitals:   07/10/16 1253  TempSrc: Oral                 Winifred Bodiford A

## 2016-07-10 NOTE — H&P (Signed)
    INTERVAL PROCEDURE H&P  History and Physical Interval Note:  07/10/2016 1:32 PM  Kayla Hopkins has presented today for their planned procedure. The various methods of treatment have been discussed with the patient and family. After consideration of risks, benefits and other options for treatment, the patient has consented to the procedure.  The patients' outpatient history has been reviewed, patient examined, and no change in status from most recent office note within the past 30 days. I have reviewed the patients' chart and labs and will proceed as planned. Questions were answered to the patient's satisfaction.   Pixie Casino, MD, [facility] Attending Cardiologist [facility]  Nadean Corwin [place] 07/10/2016, 1:32 PM

## 2016-07-10 NOTE — Anesthesia Preprocedure Evaluation (Signed)
Anesthesia Evaluation  Patient identified by MRN, date of birth, ID band Patient awake    Reviewed: Allergy & Precautions, NPO status , Patient's Chart, lab work & pertinent test results, reviewed documented beta blocker date and time   Airway Mallampati: I  TM Distance: >3 FB Neck ROM: Full    Dental  (+) Upper Dentures, Dental Advisory Given   Pulmonary former smoker,    breath sounds clear to auscultation       Cardiovascular hypertension, Pt. on medications and Pt. on home beta blockers + Peripheral Vascular Disease   Rhythm:Irregular Rate:Normal     Neuro/Psych    GI/Hepatic   Endo/Other  Morbid obesity  Renal/GU      Musculoskeletal   Abdominal   Peds  Hematology   Anesthesia Other Findings   Reproductive/Obstetrics                             Anesthesia Physical Anesthesia Plan  ASA: III  Anesthesia Plan: General   Post-op Pain Management:    Induction: Intravenous  Airway Management Planned: Mask  Additional Equipment:   Intra-op Plan:   Post-operative Plan:   Informed Consent: I have reviewed the patients History and Physical, chart, labs and discussed the procedure including the risks, benefits and alternatives for the proposed anesthesia with the patient or authorized representative who has indicated his/her understanding and acceptance.   Dental advisory given  Plan Discussed with: CRNA, Anesthesiologist and Surgeon  Anesthesia Plan Comments:         Anesthesia Quick Evaluation

## 2016-07-10 NOTE — Transfer of Care (Signed)
Immediate Anesthesia Transfer of Care Note  Patient: Kayla Hopkins  Procedure(s) Performed: Procedure(s): CARDIOVERSION (N/A)  Patient Location: PACU and Endoscopy Unit  Anesthesia Type:General  Level of Consciousness: awake, alert , oriented and patient cooperative  Airway & Oxygen Therapy: Patient Spontanous Breathing and Patient connected to nasal cannula oxygen  Post-op Assessment: Report given to RN and Post -op Vital signs reviewed and stable  Post vital signs: Reviewed and stable  Last Vitals:  Vitals:   07/10/16 1253  BP: (!) 144/79  Pulse: 73  Resp: 10  Temp: 36.6 C    Last Pain:  Vitals:   07/10/16 1253  TempSrc: Oral         Complications: No apparent anesthesia complications

## 2016-07-12 ENCOUNTER — Encounter (HOSPITAL_COMMUNITY): Payer: Self-pay | Admitting: Internal Medicine

## 2016-07-14 ENCOUNTER — Other Ambulatory Visit (HOSPITAL_COMMUNITY): Payer: Self-pay | Admitting: *Deleted

## 2016-07-14 MED ORDER — FLECAINIDE ACETATE 100 MG PO TABS: 100.0000 mg | ORAL_TABLET | Freq: Two times a day (BID) | ORAL | 6 refills | Status: DC

## 2016-07-17 ENCOUNTER — Ambulatory Visit (HOSPITAL_COMMUNITY)
Admission: RE | Admit: 2016-07-17 | Discharge: 2016-07-17 | Disposition: A | Discharge status: Home / Self Care | Payer: Commercial Managed Care - HMO | Source: Ambulatory Visit | Attending: Nurse Practitioner | Admitting: Nurse Practitioner

## 2016-07-17 ENCOUNTER — Encounter (HOSPITAL_COMMUNITY): Payer: Self-pay | Admitting: Nurse Practitioner

## 2016-07-17 VITALS — BP 156/94 | HR 103 | Ht <= 58 in | Wt 211.4 lb

## 2016-07-17 DIAGNOSIS — F4310 Post-traumatic stress disorder, unspecified: Secondary | ICD-10-CM | POA: Diagnosis not present

## 2016-07-17 DIAGNOSIS — I480 Paroxysmal atrial fibrillation: Secondary | ICD-10-CM | POA: Diagnosis not present

## 2016-07-17 DIAGNOSIS — E785 Hyperlipidemia, unspecified: Secondary | ICD-10-CM | POA: Insufficient documentation

## 2016-07-17 DIAGNOSIS — Z79899 Other long term (current) drug therapy: Secondary | ICD-10-CM | POA: Insufficient documentation

## 2016-07-17 DIAGNOSIS — I4819 Other persistent atrial fibrillation: Secondary | ICD-10-CM

## 2016-07-17 DIAGNOSIS — Z7901 Long term (current) use of anticoagulants: Secondary | ICD-10-CM | POA: Diagnosis not present

## 2016-07-17 DIAGNOSIS — I481 Persistent atrial fibrillation: Secondary | ICD-10-CM

## 2016-07-17 DIAGNOSIS — M1990 Unspecified osteoarthritis, unspecified site: Secondary | ICD-10-CM | POA: Diagnosis not present

## 2016-07-17 DIAGNOSIS — I10 Essential (primary) hypertension: Secondary | ICD-10-CM | POA: Diagnosis not present

## 2016-07-17 DIAGNOSIS — Z87891 Personal history of nicotine dependence: Secondary | ICD-10-CM | POA: Diagnosis not present

## 2016-07-17 DIAGNOSIS — F329 Major depressive disorder, single episode, unspecified: Secondary | ICD-10-CM | POA: Insufficient documentation

## 2016-07-17 MED ORDER — METOPROLOL SUCCINATE ER 50 MG PO TB24: ORAL_TABLET | ORAL | Status: DC

## 2016-07-17 NOTE — Patient Instructions (Signed)
Your physician has recommended you make the following change in your medication:  1)Restart Metoprolol 1/2 tablet daily (25mg )

## 2016-07-17 NOTE — Progress Notes (Signed)
Patient ID: Kayla Hopkins, female   DOB: 06/08/1961, 55 y.o.   MRN: [id number]     Primary Care Physician: Hoyt Koch, MD Referring Physician: Dr. Tommye Standard Kayla Hopkins is a 55 y.o. female with a h/o PAF , per pt, persistent since last November. She was seen in the afib clinic 6/27, recently failed cardioversion, and was in the afib clinic for further options. She states that she was pending a sleep study at the end of May. She is not aware of being in afib, does not cause her undo shortness of breath or fatigue during the day. Has her symptoms at night with poor sleeping and waking up with shortness of breath,feeling her heart racing. Her husband does confirm that she does snore and has apnea episodes. Most of her symptoms at night may be driven by sleep apnea. Echo showed EF of 55-60%, with Left atrium at 43 mm. Myoview, 02/12/16, showed low risk scan. She was pending a sleep study and wanted to wait until she had sleep apnea treated before she entertained any antiarrythmic drugs. She does not drink alcohol, no tobacco, is obese and does not exercise.  She returns today, 8/15, and is ready to try to restore SR. She has been on her cpap for two weeks now but is feeling more tired, fatigued, but sleeping better. Records reviewed and show a stress Myoview without ischemia in April of this year, echo normal EF, labs show normal renal function. [place] EKG's in SR reviewed and do not show any first degree of AV block, normal QT. Discussed with pt to use flecainide to restore SR and she is ready to try this. She has not missed any doses of apixaban.   Returns 8/30 and has been on flecainide 100 mg bid for last week and has not cardioverted pt and she will now be scheduled for cardioversion. No missed doses of eliquis for at least the last three weeks. Tolerating well, had a migraine on Sunday, has h/o same, but has not had migraine for years, not sure if related to drug or not.Feels well  today.  F/u cardioversion 9/8, pt did have successful cardioversion and felt very well  for a few days but felt like something last night was off with her heart rhythm and today EKG confirms that she is back in afib. Metoprolol was held the day of the cardioversion due to slow v rate but will add metoprolol back in today for v rate of 103 bpm. Is using cpap.  Today, she denies symptoms of palpitations, chest pain, shortness of breath, orthopnea, PND, lower extremity edema, dizziness, presyncope, syncope, or neurologic sequela. The patient is tolerating medications without difficulties and is otherwise without complaint today.   Past Medical History:  Diagnosis Date  . Anxiety   . Arthritis    "knees, back" (02/11/2016)  . Claudication ([place])       . Daily headache   . Depression   . Herpes    no outbreaks-has Valtrex to take PRN  . Hyperlipidemia   . Hypertension   . Migraine    "probably have 3/year; had one today; tried several medications-no relief" (02/11/2016)  . PAF (paroxysmal atrial fibrillation) ([place])   . Peripheral vascular disease ([place]) 06/11/2009   Bilateral common iliac kissing stents (8X24 mm) . 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 She underwent successful balloon angioplasty.  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   [name] PTSD (post-traumatic stress disorder)   . Shortness of breath dyspnea    patient states she has had lung test that came back normal  . Tobacco abuse    stopped smoking, 2016   Past Surgical History:  Procedure Laterality Date  . ABDOMINAL ANGIOGRAM N/A 05/11/2012   Procedure: ABDOMINAL ANGIOGRAM;  Surgeon: [name], MD;  Location: [facility] CATH LAB;  Service: Cardiovascular;  Laterality: N/A;  . ABDOMINAL AORTAGRAM N/A 10/19/2012   Procedure: ABDOMINAL Maxcine Ham;  Surgeon: [name], MD;   Location: [facility] CATH LAB;  Service: Cardiovascular;  Laterality: N/A;  . ABDOMINAL AORTAGRAM N/A 05/10/2013   Procedure: ABDOMINAL Maxcine Ham;  Surgeon: [name], MD;  Location: [facility] CATH LAB;  Service: Cardiovascular;  Laterality: N/A;  . BACK SURGERY    . CARDIAC CATHETERIZATION  2009   normal  . CARDIOVERSION N/A 03/19/2016   Procedure: CARDIOVERSION;  Surgeon: Pixie Casino, MD;  Location: [facility];  Service: Cardiovascular;  Laterality: N/A;  . CARDIOVERSION N/A 07/10/2016   Procedure: CARDIOVERSION;  Surgeon: Pixie Casino, MD;  [facility] ENDOSCOPY;  Service: Cardiovascular;  Laterality: N/A;  . ILIAC ARTERY STENT Bilateral 06/2009   common iliac kissing stents (8X24 mm)  . KNEE ARTHROSCOPY Bilateral   . LUMBAR DISC SURGERY     spinal stenosis  . ROBOTIC ASSISTED TOTAL HYSTERECTOMY WITH SALPINGECTOMY Bilateral 09/23/2015   Procedure: ROBOTIC ASSISTED TOTAL HYSTERECTOMY BILATERALSALPINGECTOMY AND OOPHORECTOMY;  Surgeon: [name], MD;  Location: [facility];  Service: Gynecology;  Laterality: Bilateral;  . TUBAL LIGATION  1991  . VULVECTOMY N/A 09/23/2015   Procedure: WIDE EXCISION VULVECTOMY  WLE of vulvar VIN 2;  Surgeon: [name], MD;  Location: [facility];  Service: Gynecology;  Laterality: N/A;    Current Outpatient Prescriptions  Medication Sig Dispense Refill  . acetaminophen (TYLENOL) 325 MG tablet Take 2 tablets (650 mg total) by mouth every 6 (six) hours as needed for mild pain (or Fever >/= 101).    [name] apixaban (ELIQUIS) 5 MG TABS tablet Take 1 tablet (5 mg total) by mouth 2 (two) times daily. 180 tablet 3  . atorvastatin (LIPITOR) 40 MG tablet TAKE 1 TABLET EVERY DAY  AT  6PM (Patient taking differently: TAKE 1 TABLET EVERY DAY  AT bedtime) 90 tablet 3  . diltiazem (CARDIZEM CD) 180 MG 24 hr capsule Take 1 capsule (180 mg total) by mouth daily. 90 capsule 3  . esomeprazole (NEXIUM) 20 MG capsule Take 20 mg by mouth at bedtime.     . fish oil-omega-3 fatty acids  1000 MG capsule Take 1 g by mouth at bedtime.     . flecainide (TAMBOCOR) 100 MG tablet Take 1 tablet (100 mg total) by mouth 2 (two) times daily. Please note dose change 60 tablet 6  . metoprolol succinate (TOPROL XL) 50 MG 24 hr tablet Take 1/2 tablet (25mg ) by mouth daily     No current facility-administered medications for this encounter.     Allergies  Allergen Reactions  . Pradaxa [Dabigatran Etexilate Mesylate]     Gastritis   . Zocor [Simvastatin]     Leg pains     Social History   Social History  . Marital status: Single    Spouse name: N/A  . Number of children: 3  . Years of education: N/A   Occupational History  . Not on file.  Social History Main Topics  . Smoking status: Former Smoker    Packs/day: 0.10    Years: 37.00    Types: Cigarettes    Quit date: 11/10/2015  . Smokeless tobacco: Never Used     Comment: 02/11/2016 "using electronic cigarettes"  . Alcohol use No  . Drug use: No  . Sexual activity: Yes    Partners: Male   Other Topics Concern  . Not on file   Social History Narrative  . No narrative on file    Family History  Problem Relation Age of Onset  . Hypertension Mother   . Ovarian cancer Mother   . Lung cancer Mother   . Heart disease Father   . Heart attack Father   . Hypertension Brother   . Colon polyps Sister   . Cancer Maternal Grandfather     lung  . Cancer Paternal Grandfather     throat     ROS- All systems are reviewed and negative except as per the HPI above  Physical Exam: Vitals:   07/17/16 1103  BP: (!) 156/94  Pulse: (!) 103  Weight: 211 lb 6.4 oz (95.9 kg)  Height: 1' (0.305 m)    GEN- The patient is well appearing, alert and oriented x 3 today.   Head- normocephalic, atraumatic Eyes-  Sclera clear, conjunctiva pink Ears- hearing intact Oropharynx- clear Neck- supple, no JVP Lymph- no cervical lymphadenopathy Lungs- Clear to ausculation bilaterally, normal work of breathing Heart- irregular rate  and rhythm, no murmurs, rubs or gallops, PMI not laterally displaced GI- soft, NT, ND, + BS Extremities- no clubbing, cyanosis, or edema MS- no significant deformity or atrophy Skin- no rash or lesion Psych- euthymic mood, full affect Neuro- strength and sensation are intact  EKG- Afib at 103 bpm  Epic records reviewed Echo-- Left ventricle: The cavity size was normal. Wall thickness was   normal. Systolic function was normal. The estimated ejection   fraction was in the range of 55% to 60%. Wall motion was normal;   there were no regional wall motion abnormalities. - Mitral valve: There was mild to moderate regurgitation. - Left atrium: The atrium was moderately dilated, 43 mm. - Pulmonary arteries: Systolic pressure was moderately increased.   PA peak pressure: 51 mm Hg (S). - Pericardium, extracardiac: A trivial pericardial effusion was   identified.  Impressions:  - Normal LV systolic function; moderate LAE; mild to moderate MR;   mild TR; moderately elevated pulmonary pressure.   Assessment and Plan: 1. Persistent symptomatic afib Successful cardioversion on flecainide but with early return of afib Continue flecainide 100 mg bid Add back in metoprolol Continue apixaban 5 mg bid Continue cpap   F/u in afib clinic one week and if still in afib, will refer to Dr. Rayann Heman for possible ablation   Butch Penny C. Bode Pieper, [facility] [address] [place], [place] [address] [phone]

## 2016-07-24 ENCOUNTER — Ambulatory Visit (HOSPITAL_COMMUNITY)
Admission: RE | Admit: 2016-07-24 | Discharge: 2016-07-24 | Disposition: A | Discharge status: Home / Self Care | Payer: Commercial Managed Care - HMO | Source: Ambulatory Visit | Attending: Nurse Practitioner | Admitting: Nurse Practitioner

## 2016-07-24 DIAGNOSIS — I482 Chronic atrial fibrillation: Secondary | ICD-10-CM | POA: Diagnosis not present

## 2016-07-24 DIAGNOSIS — I480 Paroxysmal atrial fibrillation: Secondary | ICD-10-CM | POA: Diagnosis not present

## 2016-07-24 NOTE — Progress Notes (Addendum)
Pt in for repeat EKG today.  To be reviewed by Roderic Palau, NP  Pt will h/o afib/flutter, has failed flecainide and cardioversion. Rate controlled today with addition of BB. Will referred to Dr. Rayann Heman for possible ablation.

## 2016-07-29 DIAGNOSIS — G4733 Obstructive sleep apnea (adult) (pediatric): Secondary | ICD-10-CM | POA: Diagnosis not present

## 2016-08-04 ENCOUNTER — Encounter: Payer: Self-pay | Admitting: Internal Medicine

## 2016-08-05 ENCOUNTER — Ambulatory Visit (INDEPENDENT_AMBULATORY_CARE_PROVIDER_SITE_OTHER): Payer: Commercial Managed Care - HMO | Admitting: Internal Medicine

## 2016-08-05 ENCOUNTER — Encounter: Payer: Self-pay | Admitting: Internal Medicine

## 2016-08-05 VITALS — BP 132/72 | HR 74 | Ht 66.0 in | Wt 214.0 lb

## 2016-08-05 DIAGNOSIS — I481 Persistent atrial fibrillation: Secondary | ICD-10-CM

## 2016-08-05 DIAGNOSIS — I10 Essential (primary) hypertension: Secondary | ICD-10-CM

## 2016-08-05 DIAGNOSIS — I739 Peripheral vascular disease, unspecified: Secondary | ICD-10-CM

## 2016-08-05 DIAGNOSIS — I4819 Other persistent atrial fibrillation: Secondary | ICD-10-CM

## 2016-08-05 NOTE — Patient Instructions (Signed)
Medication Instructions:  Your physician has recommended you make the following change in your medication:  1) Stop Flecainide   Labwork: None ordered   Testing/Procedures: None ordered   Follow-Up: Your physician recommends that you schedule a follow-up appointment in: 6 weeks with Dr Rayann Heman   Any Other Special Instructions Will Be Listed Below (If Applicable).     If you need a refill on your cardiac medications before your next appointment, please call your pharmacy.

## 2016-08-10 NOTE — Progress Notes (Signed)
Electrophysiology Office Note   Date:  08/10/2016   ID:  Kayla Hopkins, DOB [birth year], MRN [id number]  PCP:  Hoyt Koch, MD  Cardiologist:  Dr Fletcher Anon Primary Electrophysiologist: Thompson Grayer, MD    Chief Complaint  Patient presents with  . Atrial Fibrillation     History of Present Illness: Kayla Hopkins is a 55 y.o. female who presents today for electrophysiology evaluation.   The patient has persistent afib.  She has occasional SOB and fatigue.  She has failed medical therapy with flecainide.  She did very "much better" in sinus rhythm.  Unfortunately, she has returned to afib. She has fatigue again.  She attributes this to flecainide rather than afib. Her primary concern today is with worsening PV symptoms.  She reports claudication with low activity.   Today, she denies symptoms of palpitations, chest pain, shortness of breath, orthopnea, PND, lower extremity edema, dizziness, presyncope, syncope, bleeding, or neurologic sequela. The patient is tolerating medications without difficulties and is otherwise without complaint today.    Past Medical History:  Diagnosis Date  . Anxiety   . Arthritis    "knees, back" (02/11/2016)  . Claudication ([place])       . Daily headache   . Depression   . Herpes    no outbreaks-has Valtrex to take PRN  . Hyperlipidemia   . Hypertension   . Migraine    "probably have 3/year; had one today; tried several medications-no relief" (02/11/2016)  . PAF (paroxysmal atrial fibrillation) ([place])   . Peripheral vascular disease ([place]) 06/11/2009   Bilateral common iliac kissing stents (8X24 mm) . 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 She underwent successful balloon angioplasty.  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   [name] PTSD (post-traumatic stress disorder)   . Shortness  of breath dyspnea    patient states she has had lung test that came back normal  . Tobacco abuse    stopped smoking, 2016   Past Surgical History:  Procedure Laterality Date  . ABDOMINAL ANGIOGRAM N/A 05/11/2012   Procedure: ABDOMINAL ANGIOGRAM;  Surgeon: [name], MD;  Location: [facility] CATH LAB;  Service: Cardiovascular;  Laterality: N/A;  . ABDOMINAL AORTAGRAM N/A 10/19/2012   Procedure: ABDOMINAL Maxcine Ham;  Surgeon: [name], MD;  Location: [facility] CATH LAB;  Service: Cardiovascular;  Laterality: N/A;  . ABDOMINAL AORTAGRAM N/A 05/10/2013   Procedure: ABDOMINAL Maxcine Ham;  Surgeon: [name], MD;  Location: [facility] CATH LAB;  Service: Cardiovascular;  Laterality: N/A;  . BACK SURGERY    . CARDIAC CATHETERIZATION  2009   normal  . CARDIOVERSION N/A 03/19/2016   Procedure: CARDIOVERSION;  Surgeon: Pixie Casino, MD;  Location: [facility];  Service: Cardiovascular;  Laterality: N/A;  . CARDIOVERSION N/A 07/10/2016   Procedure: CARDIOVERSION;  Surgeon: Pixie Casino, MD;  Location[facility] ENDOSCOPY;  Service: Cardiovascular;  Laterality: N/A;  . ILIAC ARTERY STENT Bilateral 06/2009   common iliac kissing stents (8X24 mm)  . KNEE ARTHROSCOPY Bilateral   . LUMBAR DISC SURGERY     spinal stenosis  . ROBOTIC ASSISTED TOTAL HYSTERECTOMY WITH SALPINGECTOMY Bilateral 09/23/2015   Procedure: ROBOTIC ASSISTED TOTAL HYSTERECTOMY BILATERALSALPINGECTOMY AND OOPHORECTOMY;  Surgeon: [name], MD;  Location: [facility];  Service: Gynecology;  Laterality: Bilateral;  . TUBAL LIGATION  1991  . VULVECTOMY N/A 09/23/2015  Procedure: WIDE EXCISION VULVECTOMY  WLE of vulvar VIN 2;  Surgeon: [name], MD;  Location: [facility];  Service: Gynecology;  Laterality: N/A;     Current Outpatient Prescriptions  Medication Sig Dispense Refill  . acetaminophen (TYLENOL) 325 MG tablet Take 2 tablets (650 mg total) by mouth every 6 (six) hours as needed for mild pain (or Fever >/= 101).    [name] apixaban  (ELIQUIS) 5 MG TABS tablet Take 1 tablet (5 mg total) by mouth 2 (two) times daily. 180 tablet 3  . atorvastatin (LIPITOR) 40 MG tablet TAKE 1 TABLET EVERY DAY  AT  6PM 90 tablet 3  . diltiazem (CARDIZEM CD) 180 MG 24 hr capsule Take 1 capsule (180 mg total) by mouth daily. 90 capsule 3  . esomeprazole (NEXIUM) 20 MG capsule Take 20 mg by mouth at bedtime.     . fish oil-omega-3 fatty acids 1000 MG capsule Take 1 g by mouth at bedtime.     . metoprolol succinate (TOPROL XL) 50 MG 24 hr tablet Take 1/2 tablet (25mg ) by mouth daily     No current facility-administered medications for this visit.     Allergies:   Pradaxa [dabigatran etexilate mesylate] and Zocor [simvastatin]   Social History:  The patient  reports that she quit smoking about 9 months ago. Her smoking use included Cigarettes. She has a 3.70 pack-year smoking history. She has never used smokeless tobacco. She reports that she does not drink alcohol or use drugs.   Family History:  The patient's  family history includes Cancer in her maternal grandfather and paternal grandfather; Colon polyps in her sister; Heart attack in her father; Heart disease in her father; Hypertension in her brother and mother; Lung cancer in her mother; Ovarian cancer in her mother.    ROS:  Please see the history of present illness.   All other systems are reviewed and negative.    PHYSICAL EXAM: VS:  BP 132/72   Pulse 74   Ht 5\' 6"  (1.676 m)   Wt 214 lb (97.1 kg)   LMP 11/09/2008   BMI 34.54 kg/m  , BMI Body mass index is 34.54 kg/m. GEN: Well nourished, well developed, in no acute distress  HEENT: normal  Neck: no JVD, carotid bruits, or masses Cardiac: iRRR; no murmurs, rubs, or gallops,no edema  Respiratory:  clear to auscultation bilaterally, normal work of breathing GI: soft, nontender, nondistended, + BS MS: no deformity or atrophy  Skin: warm and dry  Neuro:  Strength and sensation are intact Psych: euthymic mood, full  affect  EKG:  EKG is reviewed today and reveals afib.   Recent Labs: 02/10/2016: TSH 1.124 02/12/2016: Magnesium 2.0 07/08/2016: BUN 23; Creatinine, Ser 0.87; Hemoglobin 15.2; Platelets 296; Potassium 4.7; Sodium 138    Lipid Panel     Component Value Date/Time   CHOL 188 06/08/2012 1526   TRIG 474.0 (H) 06/08/2012 1526   HDL 46.00 06/08/2012 1526   CHOLHDL 4 06/08/2012 1526   VLDL 94.8 (H) 06/08/2012 1526   LDLCALC (H) 08/11/2008 0340    147        Total Cholesterol/HDL:CHD Risk Coronary Heart Disease Risk Table                     Men   Women  1/2 Average Risk   3.4   3.3   LDLDIRECT 77.8 06/08/2012 1526     Wt Readings from Last 3 Encounters:  08/05/16 214 lb (  97.1 kg)  07/17/16 211 lb 6.4 oz (95.9 kg)  07/10/16 205 lb (93 kg)      Other studies Reviewed: Additional studies/ records that were reviewed today include: AF clinic notes, Dr Tyrell Antonio notes, prior sleep study, echo   ASSESSMENT AND PLAN:  1.  Persistent afib She has symptoms with her afib and felt better after recent cardioversion.  She has failed medical therapy with flecainide.  She feels that her fatigue is now due to flecainide rather than Afib.  I suspect that her afib is the real source of symptoms.  I will stop flecainide today. Therapeutic strategies for afib including medicine and ablation were discussed in detail with the patient today. Risk, benefits, and alternatives to EP study and radiofrequency ablation for afib were also discussed in detail today. These risks include but are not limited to stroke, bleeding, vascular damage, tamponade, perforation, damage to the esophagus, lungs, and other structures, pulmonary vein stenosis, worsening renal function, and death. The patient understands these risk and wishes to avoid ablation at this time. She will contemplate this further and will contact my office if she wishes to proceed. Continue anticoagulation  2. PVD Given worsening symptoms, I have advised  that she follow-up with Dr Fletcher Anon.  3. HTN Stable No change required today  Follow-up with Dr Fletcher Anon for [facility] management I will see again in 6 weeks.  She is aware that she may contact my office if she decides to pursue ablation in the interim.  Signed, Thompson Grayer, MD    [facility] [address] [place] [address] [phone] (office) [phone] (fax)

## 2016-08-14 ENCOUNTER — Ambulatory Visit (INDEPENDENT_AMBULATORY_CARE_PROVIDER_SITE_OTHER): Payer: Commercial Managed Care - HMO | Admitting: Obstetrics & Gynecology

## 2016-08-14 ENCOUNTER — Encounter: Payer: Self-pay | Admitting: Obstetrics & Gynecology

## 2016-08-14 VITALS — BP 134/82 | HR 82 | Resp 12 | Ht 66.0 in | Wt 216.4 lb

## 2016-08-14 DIAGNOSIS — Z124 Encounter for screening for malignant neoplasm of cervix: Secondary | ICD-10-CM | POA: Diagnosis not present

## 2016-08-14 DIAGNOSIS — N6320 Unspecified lump in the left breast, unspecified quadrant: Secondary | ICD-10-CM

## 2016-08-14 DIAGNOSIS — Z01419 Encounter for gynecological examination (general) (routine) without abnormal findings: Secondary | ICD-10-CM | POA: Diagnosis not present

## 2016-08-14 DIAGNOSIS — N900 Mild vulvar dysplasia: Secondary | ICD-10-CM | POA: Diagnosis not present

## 2016-08-14 MED ORDER — NYSTATIN 100000 UNIT/GM EX CREA: 1.0000 "application " | TOPICAL_CREAM | Freq: Two times a day (BID) | CUTANEOUS | 1 refills | Status: DC

## 2016-08-14 MED ORDER — NYSTATIN 100000 UNIT/GM EX POWD: Freq: Three times a day (TID) | CUTANEOUS | 2 refills | Status: DC

## 2016-08-14 NOTE — Progress Notes (Signed)
Bilateral diagnostic mammogram with left breast ultrasound [facility] 08[facility]19/2016 at 8:20 am. Patient is agreeable to date and time. Patient placed in mammogram hold.

## 2016-08-14 NOTE — Progress Notes (Signed)
55 y.o. G3P3 SingleCaucasianF here for annual exam.  Pt reports she is back in a fib.  Cardioversion has been attempted twice.  She is back on the eliquis.  She has been on flecanide and this made her feel so back.  The afib gives her SOB.  She's having some right arm numbness and shoulder pain.  Also, reports she has bilateral LE pain as well.  Known hx of PVD.  Has appt with vascular surgeon, Dr. Fletcher Anon, a she thinks arm and leg issues are related.  Cardioversion has been recommended for the afib.  She is still contemplating this.    Denies vaginal bleeding.    Patient's last menstrual period was 11/09/2008.          Sexually active: Yes.    The current method of family planning is status post hysterectomy.    Exercising: Yes.    walking Smoker:  no  Health Maintenance: Pap:  07/16/15 negative, HR HPV negative  History of abnormal Pap:  no MMG:  03/26/15 BIRADS 3 probably benign, U/S same day BIRADS 3 probably benign.  Pt aware follow up was recommended.  She has not scheduled this.   Colonoscopy:  Never.  Can't do this right now until the afib is resolved.   BMD:   never TDaP:  2017  Pneumonia vaccine(s):  never Zostavax:   never Hep C testing: declined.  D/W pt having this done this year with additional blood work. Screening Labs: declined, Hb today: declined, Urine today: declined   reports that she quit smoking about 9 months ago. Her smoking use included Cigarettes. She has a 3.70 pack-year smoking history. She has never used smokeless tobacco. She reports that she does not drink alcohol or use drugs.  Past Medical History:  Diagnosis Date  . Anxiety   . Arthritis    "knees, back" (02/11/2016)  . Claudication ([place])       . Daily headache   . Depression   . Herpes    no outbreaks-has Valtrex to take PRN  . Hyperlipidemia   . Hypertension   . Migraine    "probably have 3/year; had one today; tried several medications-no relief" (02/11/2016)  . PAF (paroxysmal atrial fibrillation)  ([place])   . Peripheral vascular disease ([place]) 06/11/2009   Bilateral common iliac kissing stents (8X24 mm) . 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 She underwent successful balloon angioplasty.  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   [name] PTSD (post-traumatic stress disorder)   . Shortness of breath dyspnea    patient states she has had lung test that came back normal  . Tobacco abuse    stopped smoking, 2016    Past Surgical History:  Procedure Laterality Date  . ABDOMINAL ANGIOGRAM N/A 05/11/2012   Procedure: ABDOMINAL ANGIOGRAM;  Surgeon: [name], MD;  Location: [place] CATH LAB;  Service: Cardiovascular;  Laterality: N/A;  . ABDOMINAL AORTAGRAM N/A 10/19/2012   Procedure: ABDOMINAL Maxcine Ham;  Surgeon: [name], MD;  Location: [place] CATH LAB;  Service: Cardiovascular;  Laterality: N/A;  . ABDOMINAL AORTAGRAM N/A 05/10/2013   Procedure: ABDOMINAL Maxcine Ham;  Surgeon: [name], MD;  Location: [place] CATH LAB;  Service: Cardiovascular;  Laterality: N/A;  . BACK SURGERY    . CARDIAC CATHETERIZATION  2009   normal  . CARDIOVERSION N/A 03/19/2016  Procedure: CARDIOVERSION;  Surgeon: Pixie Casino, MD;  [facility] END[facility]COPY;  Service: Cardiovascular;  Laterality: N/A;  . CARDIOVERSION N/A 07/10/2016   Procedure: CARDIOVERSION;  Surgeon: Pixie Casino, MD;  Location: [facility] ENDOSCOPY;  Service: Cardiovascular;  Laterality: N/A;  . ILIAC ARTERY STENT Bilateral 06/2009   common iliac kissing stents (8X24 mm)  . KNEE ARTHROSCOPY Bilateral   . LUMBAR DISC SURGERY     spinal stenosis  . ROBOTIC ASSISTED TOTAL HYSTERECTOMY WITH SALPINGECTOMY Bilateral 09/23/2015   Procedure: ROBOTIC ASSISTED TOTAL HYSTERECTOMY BILATERALSALPINGECTOMY AND OOPHORECTOMY;  Surgeon: [name], MD;  Location: [place] [facility];  Service: Gynecology;   Laterality: Bilateral;  . TUBAL LIGATION  1991  . VULVECTOMY N/A 09/23/2015   Procedure: WIDE EXCISION VULVECTOMY  WLE of vulvar VIN 2;  Surgeon: [name], MD;  Location: [place] [facility];  Service: Gynecology;  Laterality: N/A;    Current Outpatient Prescriptions  Medication Sig Dispense Refill  . acetaminophen (TYLENOL) 325 MG tablet Take 2 tablets (650 mg total) by mouth every 6 (six) hours as needed for mild pain (or Fever >/= 101).    [name] apixaban (ELIQUIS) 5 MG TABS tablet Take 1 tablet (5 mg total) by mouth 2 (two) times daily. 180 tablet 3  . atorvastatin (LIPITOR) 40 MG tablet TAKE 1 TABLET EVERY DAY  AT  6PM 90 tablet 3  . diltiazem (CARDIZEM CD) 180 MG 24 hr capsule Take 1 capsule (180 mg total) by mouth daily. 90 capsule 3  . esomeprazole (NEXIUM) 20 MG capsule Take 20 mg by mouth at bedtime.     . fish oil-omega-3 fatty acids 1000 MG capsule Take 1 g by mouth at bedtime.     . metoprolol succinate (TOPROL XL) 50 MG 24 hr tablet Take 1/2 tablet (25mg ) by mouth daily     No current facility-administered medications for this visit.     Family History  Problem Relation Age of Onset  . Hypertension Mother   . Ovarian cancer Mother   . Lung cancer Mother   . Heart disease Father   . Heart attack Father   . Hypertension Brother   . Colon polyps Sister   . Cancer Maternal Grandfather     lung  . Cancer Paternal Grandfather     throat     ROS:  Pertinent items are noted in HPI.  Otherwise, a comprehensive ROS was negative.  Exam:   BP 134/82 (BP Location: Right Arm, Patient Position: Sitting, Cuff Size: Large)   Pulse 82   Resp 12   Ht 5\' 6"  (1.676 m)   Wt 216 lb 6.4 oz (98.2 kg)   LMP 11/09/2008   BMI 34.93 kg/m   Weight change: +28#  Height: 5\' 6"  (167.6 cm)  Ht Readings from Last 3 Encounters:  08/14/16 5\' 6"  (1.676 m)  08/05/16 5\' 6"  (1.676 m)  07/17/16 1' (0.305 m)    General appearance: alert, cooperative and appears stated age Head: Normocephalic, without  obvious abnormality, atraumatic Neck: no adenopathy, supple, symmetrical, trachea midline and thyroid normal to inspection and palpation Lungs: clear to auscultation bilaterally Breasts: normal appearance, no masses or tenderness Heart: irregularly irregular rate, no murmurs Abdomen: soft, non-tender; bowel sounds normal; no masses,  no organomegaly Extremities: extremities normal, atraumatic, no cyanosis or edema Skin: Skin color, texture, turgor normal. No rashes or lesions Lymph nodes: Cervical, supraclavicular, and axillary nodes normal. No abnormal inguinal nodes palpated Neurologic: Grossly normal  Pelvic: External genitalia:  no lesions, inner thigh  erythema c/w yeast              Urethra:  normal appearing urethra with no masses, tenderness or lesions              Bartholins and Skenes: normal                 Vagina: normal appearing vagina with normal color and discharge, no lesions              Cervix: absent              Pap taken: Yes.   Bimanual Exam:  Uterus:  uterus absent              Adnexa: no mass, fullness, tenderness               Rectovaginal: Confirms               Anus:  normal sphincter tone, no lesions  Entire vulva bathed with 3% acetic acid for 3 minutes.  Entire vulva examined with 7.5X magnification.  No AWE lesions noted.  No biopsies obtained.[id number]   Chaperone was present for exam.  A:  Normal gynecological exam H/o Robotic TLH/BSO, WLE with bilateral ovarian Brenner tumors, VIN2 with + margins H/O peripheral artery disease s/p iliac stenting Former smoker, quit 5/16 Hypertension Chronic afib, on Eliquis Abnormal MMG last year.  Pt never went for follow up. Skin yeast  P:  MMG will be scheduled and if any diagnostic studies are needed, these will be done at the same time Pap smear obtained today Repeat vulvar colposcopy in six months. Declines colonoscopy this year due to afib.  Declines doing stool testing for blood. Nystatin powder and cream  rx to pharmacy [facility] 1 year.

## 2016-08-17 LAB — IPS PAP TEST WITH REFLEX TO HPV

## 2016-08-19 ENCOUNTER — Other Ambulatory Visit: Payer: Commercial Managed Care - HMO

## 2016-08-21 ENCOUNTER — Other Ambulatory Visit: Payer: Self-pay

## 2016-08-21 DIAGNOSIS — I739 Peripheral vascular disease, unspecified: Secondary | ICD-10-CM

## 2016-08-28 ENCOUNTER — Ambulatory Visit (HOSPITAL_COMMUNITY)
Admission: RE | Admit: 2016-08-28 | Discharge: 2016-08-28 | Disposition: A | Discharge status: Home / Self Care | Payer: Commercial Managed Care - HMO | Source: Ambulatory Visit | Attending: Cardiology | Admitting: Cardiology

## 2016-08-28 ENCOUNTER — Encounter: Payer: Self-pay | Admitting: Physician Assistant

## 2016-08-28 ENCOUNTER — Ambulatory Visit (INDEPENDENT_AMBULATORY_CARE_PROVIDER_SITE_OTHER): Payer: Commercial Managed Care - HMO | Admitting: Physician Assistant

## 2016-08-28 VITALS — BP 165/94 | HR 87 | Ht 66.0 in | Wt 224.0 lb

## 2016-08-28 DIAGNOSIS — I481 Persistent atrial fibrillation: Secondary | ICD-10-CM

## 2016-08-28 DIAGNOSIS — I739 Peripheral vascular disease, unspecified: Secondary | ICD-10-CM | POA: Insufficient documentation

## 2016-08-28 DIAGNOSIS — G4733 Obstructive sleep apnea (adult) (pediatric): Secondary | ICD-10-CM | POA: Diagnosis not present

## 2016-08-28 DIAGNOSIS — I4819 Other persistent atrial fibrillation: Secondary | ICD-10-CM

## 2016-08-28 MED ORDER — ATORVASTATIN CALCIUM 40 MG PO TABS: 40.0000 mg | ORAL_TABLET | Freq: Every day | ORAL | 3 refills | Status: DC

## 2016-08-28 MED ORDER — DILTIAZEM HCL ER COATED BEADS 180 MG PO CP24: 180.0000 mg | ORAL_CAPSULE | Freq: Every day | ORAL | 3 refills | Status: DC

## 2016-08-28 MED ORDER — METOPROLOL SUCCINATE ER 50 MG PO TB24: 50.0000 mg | ORAL_TABLET | Freq: Every day | ORAL | 3 refills | Status: DC

## 2016-08-28 NOTE — Patient Instructions (Signed)
Rhonda Barrett, PA-C, has recommended making the following medication changes: 1. INCREASE Metoprolol to 50 mg daily  >>Okay to take additional 0.5-1 tablet daily as needed   Please keep your previously scheduled follow-up appointments with Drs Rayann Heman and Fletcher Anon!

## 2016-08-28 NOTE — Progress Notes (Signed)
Cardiology Office Note   Date:  08/28/2016   ID:  SEE OMALLEY, DOB 11-Sep-1961, MRN [id number]  PCP:  Hoyt Koch, MD  Cardiologist:  Dr Rayann Heman 08/05/2016, Dr Fletcher Anon 04/21/2016 Barrett, Suanne Marker, PA-C   History of Present Illness: Kayla Hopkins is a 55 y.o. female with a history of persistent afib (failed Flecainide) on Eliquis, PAD, PTSD, cath 2009 ok, ?OSA.  Seen 08/05/2016 by Dr Rayann Heman to discuss afib ablation, did not wish to pursue at this time. PAD sx worsening, f/u w/ Dr Fletcher Anon. Iliac stents patent 08/2015. Vascular studies scheduled.  Kayla Hopkins presents for further evaluation and treatment of her cardiac issues.  She feels terrible all the time. She has some pain issues in that she has pain in her legs and especially pain in her right arm. The pain in her right arm is associated with numbness at times. This starts at the shoulder and goes down. This is the most severe pain she is having. Her legs hurt when she walks. She also gets significant dyspnea on exertion. She has some chronic orthopnea related to body habitus, but is compliant with the C Pap so she never wakes up during the night.  She is aware that her heart rate is elevated. She has an appointment with Dr. Rayann Heman in November, and is thinking that she may agree to the atrial fibrillation ablation. She does not feel that her heart rate is controlled much time. She knows that her metoprolol was decreased after her cardioversion. She feels that she only stayed in sinus rhythm about 3 days.   She is now aware that although the flecainide was not effective, it was not what was making her feel bad, it was the atrial fibrillation itself.  She is compliant with the Eliquis, and not having any bleeding issues.   Past Medical History:  Diagnosis Date  . Anxiety   . Arthritis    "knees, back" (02/11/2016)  . Claudication ([place])       . Daily headache   . Depression   . Herpes    no outbreaks-has  Valtrex to take PRN  . Hyperlipidemia   . Hypertension   . Migraine    "probably have 3/year; had one today; tried several medications-no relief" (02/11/2016)  . PAF (paroxysmal atrial fibrillation) ([place])   . Peripheral vascular disease ([place]) 06/11/2009   Bilateral common iliac kissing stents (8X24 mm) . 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 She underwent successful balloon angioplasty.  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   [name] PTSD (post-traumatic stress disorder)   . Shortness of breath dyspnea    patient states she has had lung test that came back normal  . Tobacco abuse    stopped smoking, 2016    Past Surgical History:  Procedure Laterality Date  . ABDOMINAL ANGIOGRAM N/A 05/11/2012   Procedure: ABDOMINAL ANGIOGRAM;  Surgeon: [name], MD;  Location: [place] CATH LAB;  Service: Cardiovascular;  Laterality: N/A;  . ABDOMINAL AORTAGRAM N/A 10/19/2012   Procedure: ABDOMINAL Maxcine Ham;  Surgeon: [name], MD;  Location: [place] CATH LAB;  Service: Cardiovascular;  Laterality: N/A;  . ABDOMINAL AORTAGRAM N/A 05/10/2013   Procedure: ABDOMINAL Maxcine Ham;  Surgeon: [name], MD;  Location: [place] CATH LAB;  Service: Cardiovascular;  Laterality: N/A;  . BACK SURGERY    .  CARDIAC CATHETERIZATION  2009   normal  . CARDIOVERSION N/A 03/19/2016   Procedure: CARDIOVERSION;  Surgeon: Pixie Casino, MD;  Location: [place];  Service: Cardiovascular;  Laterality: N/A;  . CARDIOVERSION N/A 07/10/2016   Procedure: CARDIOVERSION;  Surgeon: Pixie Casino, MD;  Location: [facility] ENDOSCOPY;  Service: Cardiovascular;  Laterality: N/A;  . ILIAC ARTERY STENT Bilateral 06/2009   common iliac kissing stents (8X24 mm)  . KNEE ARTHROSCOPY Bilateral   . LUMBAR DISC SURGERY     spinal stenosis  . ROBOTIC ASSISTED TOTAL HYSTERECTOMY WITH  SALPINGECTOMY Bilateral 09/23/2015   Procedure: ROBOTIC ASSISTED TOTAL HYSTERECTOMY BILATERALSALPINGECTOMY AND OOPHORECTOMY;  Surgeon: [name], MD;  Location: [place] [facility];  Service: Gynecology;  Laterality: Bilateral;  . TUBAL LIGATION  1991  . VULVECTOMY N/A 09/23/2015   Procedure: WIDE EXCISION VULVECTOMY  WLE of vulvar VIN 2;  Surgeon: [name], MD;  Location: [place] [facility];  Service: Gynecology;  Laterality: N/A;    Current Outpatient Prescriptions  Medication Sig Dispense Refill  . acetaminophen (TYLENOL) 325 MG tablet Take 2 tablets (650 mg total) by mouth every 6 (six) hours as needed for mild pain (or Fever >/= 101).    [name] apixaban (ELIQUIS) 5 MG TABS tablet Take 1 tablet (5 mg total) by mouth 2 (two) times daily. 180 tablet 3  . atorvastatin (LIPITOR) 40 MG tablet TAKE 1 TABLET EVERY DAY  AT  6PM 90 tablet 3  . diltiazem (CARDIZEM CD) 180 MG 24 hr capsule Take 1 capsule (180 mg total) by mouth daily. 90 capsule 3  . esomeprazole (NEXIUM) 20 MG capsule Take 20 mg by mouth at bedtime.     . fish oil-omega-3 fatty acids 1000 MG capsule Take 1 g by mouth at bedtime.     . metoprolol succinate (TOPROL XL) 50 MG 24 hr tablet Take 1/2 tablet (25mg ) by mouth daily    . nystatin (MYCOSTATIN/NYSTOP) powder Apply topically 3 (three) times daily. Apply to affected area for up to 7 days 45 g 2  . nystatin cream (MYCOSTATIN) Apply 1 application topically 2 (two) times daily. Apply to affected area BID for up to 7 days. 30 g 1   No current facility-administered medications for this visit.     Allergies:   Pradaxa [dabigatran etexilate mesylate] and Zocor [simvastatin]    Social History:  The patient  reports that she quit smoking about 9 months ago. Her smoking use included Cigarettes. She has a 3.70 pack-year smoking history. She has never used smokeless tobacco. She reports that she does not drink alcohol or use drugs.   Family History:  The patient's family history includes Cancer in her  maternal grandfather and paternal grandfather; Colon polyps in her sister; Heart attack in her father; Heart disease in her father; Hypertension in her brother and mother; Lung cancer in her mother; Ovarian cancer in her mother.    ROS:  Please see the history of present illness. All other systems are reviewed and negative.    PHYSICAL EXAM: VS:  BP (!) 165/94   Pulse 87   Ht 5\' 6"  (1.676 m)   Wt 224 lb (101.6 kg)   LMP 11/09/2008   BMI 36.15 kg/m  , BMI Body mass index is 36.15 kg/m. GEN: Well nourished, well developed, female in no acute distress  HEENT: normal for age  Neck: no JVDSeen but difficult to assess secondary to body habitus, no carotid bruit, no masses Cardiac: Rapid and irregular; soft murmur, no rubs,  or gallops Respiratory:  Decreased breath sounds bases bilaterally, normal work of breathing GI: soft, nontender, nondistended, + BS MS: no deformity or atrophy; no edema; distal pulses are 2+ in all 4 extremities   Skin: warm and dry, no rash Neuro:  Strength and sensation are intact Psych: euthymic mood, full affect   EKG:  EKG is not ordered today.  Recent Labs: 02/10/2016: TSH 1.124 02/12/2016: Magnesium 2.0 07/08/2016: BUN 23; Creatinine, Ser 0.87; Hemoglobin 15.2; Platelets 296; Potassium 4.7; Sodium 138    Lipid Panel    Component Value Date/Time   CHOL 188 06/08/2012 1526   TRIG 474.0 (H) 06/08/2012 1526   HDL 46.00 06/08/2012 1526   CHOLHDL 4 06/08/2012 1526   VLDL 94.8 (H) 06/08/2012 1526   LDLCALC (H) 08/11/2008 0340    147        Total Cholesterol/HDL:CHD Risk Coronary Heart Disease Risk Table                     Men   Women  1/2 Average Risk   3.4   3.3   LDLDIRECT 77.8 06/08/2012 1526     Wt Readings from Last 3 Encounters:  08/28/16 224 lb (101.6 kg)  08/14/16 216 lb 6.4 oz (98.2 kg)  08/05/16 214 lb (97.1 kg)     Other studies Reviewed: Additional studies/ records that were reviewed today include: Hospital records, office notes and  testing.  ASSESSMENT AND PLAN:  1.  Persistent atrial fibrillation with rapid ventricular response: Her heart rate is not well controlled. Her blood pressure is elevated today. We will increase her metoprolol for better heart rate and blood pressure control. If 50 mg a day is not enough, she is encouraged to take an extra one half or one tablet daily as needed. We will give her enough tablets to be able to do this consistently. She is to keep the appointment with Dr. Rayann Heman, and discuss ablation  2. PAD: According to Ms. Aleo, the preliminary report on her Doppler studies is good. The tech told her that she did not have any critical blockages. Distal pulses are palpable. Follow-up as scheduled with Dr. Fletcher Anon. I explained to her that with good distal pulses, the pain and numbness she is having in her right arm is not coming from his circulatory cause, she should follow-up with her primary care physician to be evaluated for musculoskeletal cause.   Current medicines are reviewed at length with the patient today.  The patient does not have concerns regarding medicines.  The following changes have been made:  Increase metoprolol  Labs/ tests ordered today include:  No orders of the defined types were placed in this encounter.    Disposition:   FU with Dr. Rayann Heman and Dr. Fletcher Anon as scheduled  Signed, Lenoard Aden  08/28/2016 4:55 PM    [place] Phone: [phone]; Fax: [phone]  This note was written with the assistance of speech recognition software. Please excuse any transcriptional errors.

## 2016-09-09 ENCOUNTER — Encounter: Payer: Self-pay | Admitting: Internal Medicine

## 2016-09-09 ENCOUNTER — Ambulatory Visit (INDEPENDENT_AMBULATORY_CARE_PROVIDER_SITE_OTHER): Payer: Commercial Managed Care - HMO | Admitting: Internal Medicine

## 2016-09-09 VITALS — BP 134/82 | HR 81 | Ht 66.0 in | Wt 223.8 lb

## 2016-09-09 DIAGNOSIS — I481 Persistent atrial fibrillation: Secondary | ICD-10-CM

## 2016-09-09 DIAGNOSIS — I10 Essential (primary) hypertension: Secondary | ICD-10-CM | POA: Diagnosis not present

## 2016-09-09 DIAGNOSIS — I4819 Other persistent atrial fibrillation: Secondary | ICD-10-CM

## 2016-09-09 MED ORDER — METOPROLOL SUCCINATE ER 50 MG PO TB24: 50.0000 mg | ORAL_TABLET | Freq: Every day | ORAL | 3 refills | Status: DC

## 2016-09-09 NOTE — Patient Instructions (Addendum)
Medication Instructions:  Your physician recommends that you continue on your current medications as directed. Please refer to the Current Medication list given to you today.   Labwork: Your physician recommends that you return for lab work on 10/06/16---you do not have to fast   Testing/Procedures:  Your physician has requested that you have a TEE. During a TEE, sound waves are used to create images of your heart. It provides your doctor with information about the size and shape of your heart and how well your heart's chambers and valves are working. In this test, a transducer is attached to the end of a flexible tube that's guided down your throat and into your esophagus (the tube leading from you mouth to your stomach) to get a more detailed image of your heart. You are not awake for the procedure. Please see the instruction sheet given to you today. For further information please visit [email or web address]  Please arrive at [facility] at 8:30am Do not eat or drink after midnight the night prior to procedure Okay to take your medications with a sip of water the morning of the procedure Will need someone to drive you home    Your physician has recommended that you have an ablation. Catheter ablation is a medical procedure used to treat some cardiac arrhythmias (irregular heartbeats). During catheter ablation, a long, thin, flexible tube is put into a blood vessel in your groin (upper thigh), or neck. This tube is called an ablation catheter. It is then guided to your heart through the blood vessel. Radio frequency waves destroy small areas of heart tissue where abnormal heartbeats may cause an arrhythmia to start. Please see the instruction sheet given to you today.---10/14/16  Please arrive at [facility] at 6:30am Do not eat or drink  after midnight the night prior to your procedure No medications  the morning of the procedure Plan for one night stay Will need some one to drive you home the day of discharge    Follow-Up:  Your physician recommends that you schedule a follow-up appointment in: 4 weeks from 10/14/16 with Roderic Palau, NP and 3 months from 10/14/16 with Dr Rayann Heman

## 2016-09-09 NOTE — Progress Notes (Signed)
Electrophysiology Office Note   Date:  09/09/2016   ID:  Kayla Hopkins, DOB 25-Nov-1960, MRN [id number]  PCP:  Hoyt Koch, MD  Cardiologist:  Dr Fletcher Anon Primary Electrophysiologist: Thompson Grayer, MD    Chief Complaint  Patient presents with  . Atrial Fibrillation     History of Present Illness: Kayla Hopkins is a 55 y.o. female who presents today for electrophysiology evaluation.   The patient has persistent afib.  She has occasional SOB and fatigue.  She has failed medical therapy with flecainide.  She did very "much better" in sinus rhythm.  Unfortunately, she has returned to afib. She stopped her flecainide.  She has since began to notice BLE edema.  Today, she denies symptoms of palpitations, chest pain, shortness of breath, orthopnea, PND,  dizziness, presyncope, syncope, bleeding, or neurologic sequela. The patient is tolerating medications without difficulties and is otherwise without complaint today.    Past Medical History:  Diagnosis Date  . Anxiety   . Arthritis    "knees, back" (02/11/2016)  . Claudication ([place])       . Daily headache   . Depression   . Herpes    no outbreaks-has Valtrex to take PRN  . Hyperlipidemia   . Hypertension   . Migraine    "probably have 3/year; had one today; tried several medications-no relief" (02/11/2016)  . PAF (paroxysmal atrial fibrillation) ([place])   . Peripheral vascular disease ([place]) 06/11/2009   Bilateral common iliac kissing stents (8X24 mm) . 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 She underwent successful balloon angioplasty.  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   [name] PTSD (post-traumatic stress disorder)   . Shortness of breath dyspnea    patient states she has had lung test that came back normal  . Tobacco abuse    stopped smoking, 2016   Past  Surgical History:  Procedure Laterality Date  . ABDOMINAL ANGIOGRAM N/A 05/11/2012   Procedure: ABDOMINAL ANGIOGRAM;  Surgeon: [name], MD;  Location: [facility] CATH LAB;  Service: Cardiovascular;  Laterality: N/A;  . ABDOMINAL AORTAGRAM N/A 10/19/2012   Procedure: ABDOMINAL Maxcine Ham;  Surgeon: [name], MD;  Location: [facility] CATH LAB;  Service: Cardiovascular;  Laterality: N/A;  . ABDOMINAL AORTAGRAM N/A 05/10/2013   Procedure: ABDOMINAL Maxcine Ham;  Surgeon: [name], MD;  Location: [facility] CATH LAB;  Service: Cardiovascular;  Laterality: N/A;  . BACK SURGERY    . CARDIAC CATHETERIZATION  2009   normal  . CARDIOVERSION N/A 03/19/2016   Procedure: CARDIOVERSION;  Surgeon: Pixie Casino, MD;  Location: [facility];  Service: Cardiovascular;  Laterality: N/A;  . CARDIOVERSION N/A 07/10/2016   Procedure: CARDIOVERSION;  Surgeon: Pixie Casino, MD;  Location: [facility] ENDOSCOPY;  Service: Cardiovascular;  Laterality: N/A;  . ILIAC ARTERY STENT Bilateral 06/2009   common iliac kissing stents (8X24 mm)  . KNEE ARTHROSCOPY Bilateral   . LUMBAR DISC SURGERY     spinal stenosis  . ROBOTIC ASSISTED TOTAL HYSTERECTOMY WITH SALPINGECTOMY Bilateral 09/23/2015   Procedure: ROBOTIC ASSISTED TOTAL HYSTERECTOMY BILATERALSALPINGECTOMY AND OOPHORECTOMY;  Surgeon: [name], MD;  Location: [facility];  Service: Gynecology;  Laterality: Bilateral;  . TUBAL LIGATION  1991  . 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 acetaminophen (TYLENOL) 325 MG tablet Take 2 tablets (650 mg total) by mouth every 6 (six) hours as needed for mild pain (or Fever >/= 101).    [name] apixaban (ELIQUIS) 5 MG TABS tablet Take 1 tablet (5 mg total) by mouth 2 (two) times daily. 180 tablet 3  . atorvastatin (LIPITOR) 40 MG tablet  Take 1 tablet (40 mg total) by mouth daily. 90 tablet 3  . diltiazem (CARDIZEM CD) 180 MG 24 hr capsule Take 1 capsule (180 mg total) by mouth daily. 90 capsule 3  . esomeprazole (NEXIUM) 20 MG capsule Take 20 mg by mouth at bedtime.     . fish oil-omega-3 fatty acids 1000 MG capsule Take 1 g by mouth at bedtime.     . metoprolol succinate (TOPROL XL) 50 MG 24 hr tablet Take 1-2 tablets (50-100 mg total) by mouth daily. Okay to take additional 0.5-1 tablet daily as needed. 180 tablet 3  . nystatin (MYCOSTATIN/NYSTOP) powder Apply topically 3 (three) times daily. Apply to affected area for up to 7 days 45 g 2  . nystatin cream (MYCOSTATIN) Apply 1 application topically 2 (two) times daily. Apply to affected area BID for up to 7 days. 30 g 1   No current facility-administered medications for this visit.     Allergies:   Pradaxa [dabigatran etexilate mesylate] and Zocor [simvastatin]   Social History:  The patient  reports that she quit smoking about 10 months ago. Her smoking use included Cigarettes. She has a 3.70 pack-year smoking history. She has never used smokeless tobacco. She reports that she does not drink alcohol or use drugs.   Family History:  The patient's  family history includes Cancer in her maternal grandfather and paternal grandfather; Colon polyps in her sister; Heart attack in her father; Heart disease in her father; Hypertension in her brother and mother; Lung cancer in her mother; Ovarian cancer in her mother.    ROS:  Please see the history of present illness.   All other systems are reviewed and negative.    PHYSICAL EXAM: VS:  BP 134/82   Pulse 81   Ht 5\' 6"  (1.676 m)   Wt 223 lb 12.8 oz (101.5 kg)   LMP 11/09/2008   BMI 36.12 kg/m  , BMI Body mass index is 36.12 kg/m. GEN: Well nourished, well developed, in no acute distress  HEENT: normal  Neck: no JVD, carotid bruits, or masses Cardiac: iRRR; no murmurs, rubs, or gallops,no edema  Respiratory:  clear to  auscultation bilaterally, normal work of breathing GI: soft, nontender, nondistended, + BS MS: no deformity or atrophy  Skin: warm and dry  Neuro:  Strength and sensation are intact Psych: euthymic mood, full affect  EKG:  EKG is reviewed today and reveals afib 81 bpm   Recent Labs: 02/10/2016: TSH 1.124 02/12/2016: Magnesium 2.0 07/08/2016: BUN 23; Creatinine, Ser 0.87; Hemoglobin 15.2; Platelets 296; Potassium 4.7; Sodium 138    Lipid Panel     Component Value Date/Time   CHOL 188 06/08/2012 1526   TRIG 474.0 (H) 06/08/2012 1526   HDL 46.00 06/08/2012 1526   CHOLHDL 4 06/08/2012 1526   VLDL 94.8 (H) 06/08/2012 1526   LDLCALC (H) 08/11/2008 0340    147        Total Cholesterol/HDL:CHD Risk Coronary Heart Disease Risk Table  Men   Women  1/2 Average Risk   3.4   3.3   LDLDIRECT 77.8 06/08/2012 1526     Wt Readings from Last 3 Encounters:  09/09/16 223 lb 12.8 oz (101.5 kg)  08/28/16 224 lb (101.6 kg)  08/14/16 216 lb 6.4 oz (98.2 kg)      Other studies Reviewed: Additional studies/ records that were reviewed today include: AF clinic notes, Dr Tyrell Antonio notes, prior sleep study, echo--> moderate LA enlargement   ASSESSMENT AND PLAN:  1.  Persistent afib She has symptoms persistent afib.   She has failed medical therapy with flecainide. She has moderate LA enlargement by echo.  Therapeutic strategies for afib including medicine (tikosyn) and ablation were discussed in detail with the patient today. Risk, benefits, and alternatives to EP study and radiofrequency ablation for afib were also discussed in detail today. These risks include but are not limited to stroke, bleeding, vascular damage, tamponade, perforation, damage to the esophagus, lungs, and other structures, pulmonary vein stenosis, worsening renal function, and death. The patient understands these risk and wishes to proceed. Continue anticoagulation with eliquis.  2. PVD Per Dr Fletcher Anon.  3.  HTN Stable No change required today  4. Overweight Body mass index is 36.12 kg/m. I have reviewed the patients BMI and decreased success rates with ablation at length today.  Weight loss is strongly advised.  Per Guijian et al (PACE 2013; [id number]), patients with BMI 25-29.9 (obese) have a 27% increase in AF recurrence post ablation.  Patients with BMI >30 have a 31% increase in AF recurrence post ablation when compared to those with BMI <25.  Signed, Thompson Grayer, MD    [place] [address] [phone] (office) [phone] (fax)

## 2016-09-11 ENCOUNTER — Telehealth: Payer: Self-pay | Admitting: *Deleted

## 2016-09-11 NOTE — Telephone Encounter (Signed)
Patient called and stated that she is in the coverage gap and that the she is unable to afford the eliquis. I have discussed patient assistance with her and she is willing to see if she will qualify. I will place samples and a patient assistance application at the front desk for her.

## 2016-09-17 ENCOUNTER — Other Ambulatory Visit: Payer: Self-pay

## 2016-09-17 DIAGNOSIS — I480 Paroxysmal atrial fibrillation: Secondary | ICD-10-CM

## 2016-09-17 MED ORDER — APIXABAN 5 MG PO TABS: 5.0000 mg | ORAL_TABLET | Freq: Two times a day (BID) | ORAL | 3 refills | Status: DC

## 2016-09-18 ENCOUNTER — Telehealth: Payer: Self-pay

## 2016-09-18 NOTE — Telephone Encounter (Signed)
Prior auth obtained for Metoprolol 50mg  tabs, as dir from [facility].

## 2016-09-21 ENCOUNTER — Ambulatory Visit: Payer: Commercial Managed Care - HMO | Admitting: Internal Medicine

## 2016-09-22 ENCOUNTER — Telehealth: Payer: Self-pay

## 2016-09-22 NOTE — Telephone Encounter (Signed)
Application for Patient Assistance for Eliquis 5mg  faxed to [employer].

## 2016-09-28 DIAGNOSIS — G4733 Obstructive sleep apnea (adult) (pediatric): Secondary | ICD-10-CM | POA: Diagnosis not present

## 2016-09-30 ENCOUNTER — Ambulatory Visit: Payer: Commercial Managed Care - HMO | Admitting: Internal Medicine

## 2016-10-06 ENCOUNTER — Other Ambulatory Visit: Payer: Commercial Managed Care - HMO | Admitting: *Deleted

## 2016-10-06 DIAGNOSIS — I481 Persistent atrial fibrillation: Secondary | ICD-10-CM | POA: Diagnosis not present

## 2016-10-06 DIAGNOSIS — I4819 Other persistent atrial fibrillation: Secondary | ICD-10-CM

## 2016-10-06 LAB — CBC WITH DIFFERENTIAL/PLATELET
BASOS ABS: 66 {cells}/uL (ref 0–200)
Basophils Relative: 1 %
EOS PCT: 3 %
Eosinophils Absolute: 198 cells/uL (ref 15–500)
HEMATOCRIT: 40.3 % (ref 35.0–45.0)
HEMOGLOBIN: 13 g/dL (ref 11.7–15.5)
LYMPHS ABS: 2046 {cells}/uL (ref 850–3900)
Lymphocytes Relative: 31 %
MCH: 28.6 pg (ref 27.0–33.0)
MCHC: 32.3 g/dL (ref 32.0–36.0)
MCV: 88.6 fL (ref 80.0–100.0)
MONO ABS: 528 {cells}/uL (ref 200–950)
MPV: 9.6 fL (ref 7.5–12.5)
Monocytes Relative: 8 %
NEUTROS ABS: 3762 {cells}/uL (ref 1500–7800)
Neutrophils Relative %: 57 %
Platelets: 255 10*3/uL (ref 140–400)
RBC: 4.55 MIL/uL (ref 3.80–5.10)
RDW: 14.2 % (ref 11.0–15.0)
WBC: 6.6 10*3/uL (ref 3.8–10.8)

## 2016-10-06 LAB — BASIC METABOLIC PANEL
BUN: 17 mg/dL (ref 7–25)
CALCIUM: 9.5 mg/dL (ref 8.6–10.4)
CO2: 28 mmol/L (ref 20–31)
Chloride: 104 mmol/L (ref 98–110)
Creat: 0.81 mg/dL (ref 0.50–1.05)
GLUCOSE: 95 mg/dL (ref 65–99)
POTASSIUM: 4.5 mmol/L (ref 3.5–5.3)
SODIUM: 140 mmol/L (ref 135–146)

## 2016-10-09 ENCOUNTER — Telehealth: Payer: Self-pay

## 2016-10-09 NOTE — Telephone Encounter (Signed)
Patient removed from mammogram hold. Recall placed for 01/07/2017. Note placed in recall.  Routing to provider for final review. Patient agreeable to disposition. Will close encounter.

## 2016-10-09 NOTE — Telephone Encounter (Signed)
I would remove from MMG hold and place in recall due to upcoming surgery.  I would change this to MMG recall for 01/07/17.  Please make notes in the recall as this one is not a typical recall.  Thanks.

## 2016-10-09 NOTE — Telephone Encounter (Signed)
Spoke with patient. Patient was scheduled for left breast diagnostic mammogram with ultrasound at the [facility] on 08/19/2016 for a 6 month follow up of a left breast mass. The patient cancelled this appointment. Per patient she is having heart surgery on 10/14/2016 and does not desire to reschedule this appointment at this time due to other health concerns. States she will rescheduled after she has healed from her surgery. Will contact the [facility] or our office to facilitate scheduling.  Dr.Miller, okay to remove from mammogram hold? Place in recall?

## 2016-10-12 ENCOUNTER — Other Ambulatory Visit: Payer: Self-pay | Admitting: Nurse Practitioner

## 2016-10-13 ENCOUNTER — Encounter (HOSPITAL_COMMUNITY): Payer: Self-pay | Admitting: Cardiology

## 2016-10-13 ENCOUNTER — Encounter (HOSPITAL_COMMUNITY)
Admission: RE | Disposition: A | Discharge status: Home / Self Care | Payer: Self-pay | Source: Ambulatory Visit | Attending: Cardiology

## 2016-10-13 ENCOUNTER — Ambulatory Visit (HOSPITAL_COMMUNITY)
Admission: RE | Admit: 2016-10-13 | Discharge: 2016-10-13 | Disposition: A | Discharge status: Home / Self Care | Payer: Commercial Managed Care - HMO | Source: Ambulatory Visit | Attending: Cardiology | Admitting: Cardiology

## 2016-10-13 ENCOUNTER — Ambulatory Visit (HOSPITAL_BASED_OUTPATIENT_CLINIC_OR_DEPARTMENT_OTHER): Payer: Commercial Managed Care - HMO

## 2016-10-13 ENCOUNTER — Telehealth: Payer: Self-pay | Admitting: *Deleted

## 2016-10-13 DIAGNOSIS — Z8041 Family history of malignant neoplasm of ovary: Secondary | ICD-10-CM | POA: Insufficient documentation

## 2016-10-13 DIAGNOSIS — Z9889 Other specified postprocedural states: Secondary | ICD-10-CM | POA: Diagnosis not present

## 2016-10-13 DIAGNOSIS — Z90722 Acquired absence of ovaries, bilateral: Secondary | ICD-10-CM | POA: Diagnosis not present

## 2016-10-13 DIAGNOSIS — Z801 Family history of malignant neoplasm of trachea, bronchus and lung: Secondary | ICD-10-CM | POA: Diagnosis not present

## 2016-10-13 DIAGNOSIS — Z79899 Other long term (current) drug therapy: Secondary | ICD-10-CM | POA: Insufficient documentation

## 2016-10-13 DIAGNOSIS — I481 Persistent atrial fibrillation: Secondary | ICD-10-CM | POA: Diagnosis not present

## 2016-10-13 DIAGNOSIS — I739 Peripheral vascular disease, unspecified: Secondary | ICD-10-CM | POA: Diagnosis not present

## 2016-10-13 DIAGNOSIS — I340 Nonrheumatic mitral (valve) insufficiency: Secondary | ICD-10-CM

## 2016-10-13 DIAGNOSIS — M479 Spondylosis, unspecified: Secondary | ICD-10-CM | POA: Insufficient documentation

## 2016-10-13 DIAGNOSIS — Z8249 Family history of ischemic heart disease and other diseases of the circulatory system: Secondary | ICD-10-CM | POA: Insufficient documentation

## 2016-10-13 DIAGNOSIS — Z87891 Personal history of nicotine dependence: Secondary | ICD-10-CM | POA: Diagnosis not present

## 2016-10-13 DIAGNOSIS — Z7901 Long term (current) use of anticoagulants: Secondary | ICD-10-CM | POA: Insufficient documentation

## 2016-10-13 DIAGNOSIS — I071 Rheumatic tricuspid insufficiency: Secondary | ICD-10-CM | POA: Diagnosis not present

## 2016-10-13 DIAGNOSIS — I119 Hypertensive heart disease without heart failure: Secondary | ICD-10-CM | POA: Diagnosis not present

## 2016-10-13 DIAGNOSIS — M170 Bilateral primary osteoarthritis of knee: Secondary | ICD-10-CM | POA: Diagnosis not present

## 2016-10-13 DIAGNOSIS — E785 Hyperlipidemia, unspecified: Secondary | ICD-10-CM | POA: Diagnosis not present

## 2016-10-13 DIAGNOSIS — I480 Paroxysmal atrial fibrillation: Secondary | ICD-10-CM | POA: Diagnosis not present

## 2016-10-13 DIAGNOSIS — Z6836 Body mass index (BMI) 36.0-36.9, adult: Secondary | ICD-10-CM | POA: Insufficient documentation

## 2016-10-13 DIAGNOSIS — E663 Overweight: Secondary | ICD-10-CM | POA: Diagnosis not present

## 2016-10-13 DIAGNOSIS — Z90710 Acquired absence of both cervix and uterus: Secondary | ICD-10-CM | POA: Insufficient documentation

## 2016-10-13 HISTORY — PX: TEE WITHOUT CARDIOVERSION: SHX5443

## 2016-10-13 SURGERY — ECHOCARDIOGRAM, TRANSESOPHAGEAL
Anesthesia: Moderate Sedation

## 2016-10-13 MED ORDER — FENTANYL CITRATE (PF) 100 MCG/2ML IJ SOLN
INTRAMUSCULAR | Status: DC | PRN
  Administered 2016-10-13 (×2): 25 ug via INTRAVENOUS

## 2016-10-13 MED ORDER — DIPHENHYDRAMINE HCL 50 MG/ML IJ SOLN
INTRAMUSCULAR | Status: AC
  Filled 2016-10-13: qty 1

## 2016-10-13 MED ORDER — SODIUM CHLORIDE 0.9 % IV SOLN: INTRAVENOUS | Status: DC

## 2016-10-13 MED ORDER — MIDAZOLAM HCL 5 MG/ML IJ SOLN
INTRAMUSCULAR | Status: AC
  Filled 2016-10-13: qty 2

## 2016-10-13 MED ORDER — MIDAZOLAM HCL 10 MG/2ML IJ SOLN
INTRAMUSCULAR | Status: DC | PRN
  Administered 2016-10-13 (×2): 2 mg via INTRAVENOUS

## 2016-10-13 MED ORDER — FENTANYL CITRATE (PF) 100 MCG/2ML IJ SOLN
INTRAMUSCULAR | Status: AC
  Filled 2016-10-13: qty 2

## 2016-10-13 MED ORDER — SODIUM CHLORIDE 0.9 % IV SOLN
INTRAVENOUS | Status: DC
  Administered 2016-10-13: 500 mL via INTRAVENOUS

## 2016-10-13 NOTE — Progress Notes (Signed)
  Echocardiogram Echocardiogram Transesophageal has been performed.  Kayla Hopkins 10/13/2016, 10:53 AM

## 2016-10-13 NOTE — CV Procedure (Signed)
See TEE report in [facility] Pt sedated with versed 4 mg and fentanyl 50 micrograms IV Normal LV function Moderate MR; moderate to severe TR Moderate to severe LAE; spontaneous contrast in LA and LAA but no o LAA thrombus Full report to follow Kirk Ruths

## 2016-10-13 NOTE — Discharge Instructions (Signed)

## 2016-10-13 NOTE — Progress Notes (Signed)
Echo reviewed and discussed with Dr Roxy Manns. Given at least moderate MR, moderate to severe LA enlargement, and moderate to severe TR, anticipated success rates with endocardial ablation is reduced.  I would therefore advise that she be evaluated electively by Dr Roxy Manns for consideration of surgical MAZE with LAA amputation and possibly valvular correction.   I have therefore spoken with the patient and spouse today.  Will cancel pulmonary vein isolation scheduled for tomorrow and make referral to see Dr Roxy Manns.  Thompson Grayer MD, [facility] 10/13/2016 3:22 PM

## 2016-10-13 NOTE — H&P (Signed)
Kayla Hopkins  09/09/2016 10:15 AM  Office Visit  MRN:  [id number]  Description: Female DOB: [birth year] Provider: Thompson Grayer, MD Department: [facility] Office  Vitals   BP  134/82     Pulse  81     Ht  5\' 6"  (1.676 m)     Wt  223 lb 12.8 oz (101.5 kg)     LMP  11/09/2008      BMI  36.12 kg/m      Vitals History  Progress Notes   Thompson Grayer, MD at 09/09/2016 10:15 AM   Status: Signed  Expand All Collapse All      Electrophysiology Office Note   Date:  09/09/2016   ID:  Kayla Hopkins, DOB 05/02/1961, MRN [id number]  PCP:  Hoyt Koch, MD        Cardiologist:  Dr Fletcher Anon Primary Electrophysiologist: Thompson Grayer, MD          Chief Complaint  Patient presents with  . Atrial Fibrillation     History of Present Illness: Kayla Hopkins is a 55 y.o. female who presents today for electrophysiology evaluation.   The patient has persistent afib.  She has occasional SOB and fatigue.  She has failed medical therapy with flecainide.  She did very "much better" in sinus rhythm.  Unfortunately, she has returned to afib. She stopped her flecainide.  She has since began to notice BLE edema.  Today, she denies symptoms of palpitations, chest pain, shortness of breath, orthopnea, PND,  dizziness, presyncope, syncope, bleeding, or neurologic sequela. The patient is tolerating medications without difficulties and is otherwise without complaint today.        Past Medical History:  Diagnosis Date  . Anxiety   . Arthritis    "knees, back" (02/11/2016)  . Claudication ([place])       . Daily headache   . Depression   . Herpes    no outbreaks-has Valtrex to take PRN  . Hyperlipidemia   . Hypertension   . Migraine    "probably have 3/year; had one today; tried several medications-no relief" (02/11/2016)  . PAF (paroxysmal atrial fibrillation) ([place])   . Peripheral vascular disease ([place]) 06/11/2009   Bilateral common iliac kissing  stents (8X24 mm) . 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 She underwent successful balloon angioplasty.  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   [name] PTSD (post-traumatic stress disorder)   . Shortness of breath dyspnea    patient states she has had lung test that came back normal  . Tobacco abuse    stopped smoking, 2016        Past Surgical History:  Procedure Laterality Date  . ABDOMINAL ANGIOGRAM N/A 05/11/2012   Procedure: ABDOMINAL ANGIOGRAM;  Surgeon: [name], MD;  Location: [facility] CATH LAB;  Service: Cardiovascular;  Laterality: N/A;  . ABDOMINAL AORTAGRAM N/A 10/19/2012   Procedure: ABDOMINAL Maxcine Ham;  Surgeon: [name], MD;  Location: [facility] CATH LAB;  Service: Cardiovascular;  Laterality: N/A;  . ABDOMINAL AORTAGRAM N/A 05/10/2013   Procedure: ABDOMINAL Maxcine Ham;  Surgeon: [name], MD;  Location: [facility] CATH LAB;  Service: Cardiovascular;  Laterality: N/A;  . BACK SURGERY    . CARDIAC CATHETERIZATION  2009   normal  . CARDIOVERSION N/A 03/19/2016   Procedure: CARDIOVERSION;  Surgeon: Pixie Casino, MD;  Location: [facility] ENDOSCOPY;  Service: Cardiovascular;  Laterality: N/A;  . CARDIOVERSION N/A 07/10/2016   Procedure: CARDIOVERSION;  Surgeon: Pixie Casino, MD;  Location: [facility] ENDOSCOPY;  Service: Cardiovascular;  Laterality: N/A;  . ILIAC ARTERY STENT Bilateral 06/2009   common iliac kissing stents (8X24 mm)  . KNEE ARTHROSCOPY Bilateral   . LUMBAR DISC SURGERY     spinal stenosis  . ROBOTIC ASSISTED TOTAL HYSTERECTOMY WITH SALPINGECTOMY Bilateral 09/23/2015   Procedure: ROBOTIC ASSISTED TOTAL HYSTERECTOMY BILATERALSALPINGECTOMY AND OOPHORECTOMY;  Surgeon: [name], MD;  Location: [facility];  Service: Gynecology;  Laterality: Bilateral;  . TUBAL LIGATION  1991  . VULVECTOMY N/A  09/23/2015   Procedure: WIDE EXCISION VULVECTOMY  WLE of vulvar VIN 2;  Surgeon: [name], MD;  Location: [facility];  Service: Gynecology;  Laterality: N/A;           Current Outpatient Prescriptions  Medication Sig Dispense Refill  . acetaminophen (TYLENOL) 325 MG tablet Take 2 tablets (650 mg total) by mouth every 6 (six) hours as needed for mild pain (or Fever >/= 101).    [name] apixaban (ELIQUIS) 5 MG TABS tablet Take 1 tablet (5 mg total) by mouth 2 (two) times daily. 180 tablet 3  . atorvastatin (LIPITOR) 40 MG tablet Take 1 tablet (40 mg total) by mouth daily. 90 tablet 3  . diltiazem (CARDIZEM CD) 180 MG 24 hr capsule Take 1 capsule (180 mg total) by mouth daily. 90 capsule 3  . esomeprazole (NEXIUM) 20 MG capsule Take 20 mg by mouth at bedtime.     . fish oil-omega-3 fatty acids 1000 MG capsule Take 1 g by mouth at bedtime.     . metoprolol succinate (TOPROL XL) 50 MG 24 hr tablet Take 1-2 tablets (50-100 mg total) by mouth daily. Okay to take additional 0.5-1 tablet daily as needed. 180 tablet 3  . nystatin (MYCOSTATIN/NYSTOP) powder Apply topically 3 (three) times daily. Apply to affected area for up to 7 days 45 g 2  . nystatin cream (MYCOSTATIN) Apply 1 application topically 2 (two) times daily. Apply to affected area BID for up to 7 days. 30 g 1   No current facility-administered medications for this visit.     Allergies:   Pradaxa [dabigatran etexilate mesylate] and Zocor [simvastatin]   Social History:  The patient  reports that she quit smoking about 10 months ago. Her smoking use included Cigarettes. She has a 3.70 pack-year smoking history. She has never used smokeless tobacco. She reports that she does not drink alcohol or use drugs.   Family History:  The patient's  family history includes Cancer in her maternal grandfather and paternal grandfather; Colon polyps in her sister; Heart attack in her father; Heart disease in her father; Hypertension in her  brother and mother; Lung cancer in her mother; Ovarian cancer in her mother.    ROS:  Please see the history of present illness.   All other systems are reviewed and negative.    PHYSICAL EXAM: VS:  BP 134/82   Pulse 81   Ht 5\' 6"  (1.676 m)   Wt 223 lb 12.8 oz (101.5 kg)   LMP 11/09/2008   BMI 36.12 kg/m  , BMI Body mass index is 36.12 kg/m. GEN: Well nourished, well developed, in no acute distress  HEENT: normal  Neck: no JVD, carotid bruits, or masses Cardiac: iRRR; no murmurs, rubs, or gallops,no edema  Respiratory:  clear to auscultation bilaterally, normal work of breathing GI: soft,  nontender, nondistended, + BS MS: no deformity or atrophy  Skin: warm and dry  Neuro:  Strength and sensation are intact Psych: euthymic mood, full affect  EKG:  EKG is reviewed today and reveals afib 81 bpm   Recent Labs: 02/10/2016: TSH 1.124 02/12/2016: Magnesium 2.0 07/08/2016: BUN 23; Creatinine, Ser 0.87; Hemoglobin 15.2; Platelets 296; Potassium 4.7; Sodium 138    Lipid Panel  Labs (Brief)           Component Value Date/Time   CHOL 188 06/08/2012 1526   TRIG 474.0 (H) 06/08/2012 1526   HDL 46.00 06/08/2012 1526   CHOLHDL 4 06/08/2012 1526   VLDL 94.8 (H) 06/08/2012 1526   LDLCALC (H) 08/11/2008 0340    147        Total Cholesterol/HDL:CHD Risk Coronary Heart Disease Risk Table                     Men   Women  1/2 Average Risk   3.4   3.3   LDLDIRECT 77.8 06/08/2012 1526       Wt Readings from Last 3 Encounters:  09/09/16 223 lb 12.8 oz (101.5 kg)  08/28/16 224 lb (101.6 kg)  08/14/16 216 lb 6.4 oz (98.2 kg)      Other studies Reviewed: Additional studies/ records that were reviewed today include: AF clinic notes, Dr Tyrell Antonio notes, prior sleep study, echo--> moderate LA enlargement   ASSESSMENT AND PLAN:  1.  Persistent afib She has symptoms persistent afib.   She has failed medical therapy with flecainide. She has moderate LA  enlargement by echo.  Therapeutic strategies for afib including medicine (tikosyn) and ablation were discussed in detail with the patient today. Risk, benefits, and alternatives to EP study and radiofrequency ablation for afib were also discussed in detail today. These risks include but are not limited to stroke, bleeding, vascular damage, tamponade, perforation, damage to the esophagus, lungs, and other structures, pulmonary vein stenosis, worsening renal function, and death. The patient understands these risk and wishes to proceed. Continue anticoagulation with eliquis.  2. PVD Per Dr Fletcher Anon.  3. HTN Stable No change required today  4. Overweight Body mass index is 36.12 kg/m. I have reviewed the patients BMI and decreased success rates with ablation at length today.  Weight loss is strongly advised.  Per Guijian et al (PACE 2013; [id number]), patients with BMI 25-29.9 (obese) have a 27% increase in AF recurrence post ablation.  Patients with BMI >30 have a 31% increase in AF recurrence post ablation when compared to those with BMI <25.  Signed, Thompson Grayer, MD    [place] [address] [phone] (office) [phone] (fax)     For TEE; no changes. Kirk Ruths, MD

## 2016-10-13 NOTE — Telephone Encounter (Signed)
-----   Message from Thompson Grayer, MD sent at 10/13/2016  3:26 PM EST ----- Please refer to Dr Roxy Manns for consideration of surgical MAZE and valve repair.

## 2016-10-13 NOTE — Interval H&P Note (Signed)
History and Physical Interval Note:  10/13/2016 9:58 AM  Kayla Hopkins  has presented today for surgery, with the diagnosis of AFIB  The various methods of treatment have been discussed with the patient and family. After consideration of risks, benefits and other options for treatment, the patient has consented to  Procedure(s): TRANSESOPHAGEAL ECHOCARDIOGRAM (TEE) (N/A) as a surgical intervention .  The patient's history has been reviewed, patient examined, no change in status, stable for surgery.  I have reviewed the patient's chart and labs.  Questions were answered to the patient's satisfaction.     Kirk Ruths

## 2016-10-14 ENCOUNTER — Ambulatory Visit (HOSPITAL_COMMUNITY)
Admission: RE | Admit: 2016-10-14 | Payer: Commercial Managed Care - HMO | Source: Ambulatory Visit | Admitting: Internal Medicine

## 2016-10-14 ENCOUNTER — Encounter (HOSPITAL_COMMUNITY): Payer: Self-pay | Admitting: Cardiology

## 2016-10-14 ENCOUNTER — Encounter (HOSPITAL_COMMUNITY): Admission: RE | Payer: Self-pay | Source: Ambulatory Visit

## 2016-10-14 SURGERY — ATRIAL FIBRILLATION ABLATION
Anesthesia: Monitor Anesthesia Care

## 2016-10-20 ENCOUNTER — Ambulatory Visit: Payer: Commercial Managed Care - HMO | Admitting: Cardiovascular Disease

## 2016-10-21 ENCOUNTER — Encounter: Payer: Self-pay | Admitting: Rehabilitative and Restorative Service Providers"

## 2016-10-21 NOTE — Therapy (Signed)
[facility] [address] [place], [place], [address] Phone: [phone]   Fax:  [phone]  Patient Details  Name: Kayla Hopkins MRN: [id number] Date of Birth: Jan 10, 1961 Referring Provider:  No ref. provider found  Encounter Date: 12/23/2015  PHYSICAL THERAPY DISCHARGE SUMMARY  Visits from Start of Care: eval only  Current functional level related to goals / functional outcomes: See initial summary   Remaining deficits: See initial summary   Education / Equipment: n/a  Plan: Patient agrees to discharge.  Patient goals were not met. Patient is being discharged due to not returning since the last visit.  ?????        Thank you for the referral of this patient. Rudell Cobb, MPT    Kayla Hopkins 10/21/2016, 9:37 AM  [facility] [address] [place], [place], [address] Phone: [phone]   Fax:  [phone]

## 2016-10-26 ENCOUNTER — Encounter: Payer: Self-pay | Admitting: Thoracic Surgery (Cardiothoracic Vascular Surgery)

## 2016-10-26 ENCOUNTER — Institutional Professional Consult (permissible substitution) (INDEPENDENT_AMBULATORY_CARE_PROVIDER_SITE_OTHER): Payer: Commercial Managed Care - HMO | Admitting: Thoracic Surgery (Cardiothoracic Vascular Surgery)

## 2016-10-26 ENCOUNTER — Other Ambulatory Visit: Payer: Self-pay | Admitting: *Deleted

## 2016-10-26 VITALS — BP 130/70 | HR 96 | Resp 16 | Ht 66.0 in | Wt 225.0 lb

## 2016-10-26 DIAGNOSIS — I361 Nonrheumatic tricuspid (valve) insufficiency: Secondary | ICD-10-CM

## 2016-10-26 DIAGNOSIS — I340 Nonrheumatic mitral (valve) insufficiency: Secondary | ICD-10-CM

## 2016-10-26 DIAGNOSIS — Z9989 Dependence on other enabling machines and devices: Secondary | ICD-10-CM

## 2016-10-26 DIAGNOSIS — I509 Heart failure, unspecified: Secondary | ICD-10-CM | POA: Diagnosis not present

## 2016-10-26 DIAGNOSIS — I5032 Chronic diastolic (congestive) heart failure: Secondary | ICD-10-CM | POA: Insufficient documentation

## 2016-10-26 DIAGNOSIS — D649 Anemia, unspecified: Secondary | ICD-10-CM

## 2016-10-26 DIAGNOSIS — I4819 Other persistent atrial fibrillation: Secondary | ICD-10-CM

## 2016-10-26 DIAGNOSIS — E109 Type 1 diabetes mellitus without complications: Secondary | ICD-10-CM | POA: Diagnosis not present

## 2016-10-26 DIAGNOSIS — I481 Persistent atrial fibrillation: Secondary | ICD-10-CM | POA: Diagnosis not present

## 2016-10-26 DIAGNOSIS — I071 Rheumatic tricuspid insufficiency: Secondary | ICD-10-CM | POA: Insufficient documentation

## 2016-10-26 DIAGNOSIS — G4733 Obstructive sleep apnea (adult) (pediatric): Secondary | ICD-10-CM | POA: Insufficient documentation

## 2016-10-26 LAB — CBC
HEMATOCRIT: 40.9 % (ref 35.0–45.0)
HEMOGLOBIN: 13.3 g/dL (ref 11.7–15.5)
MCH: 28.7 pg (ref 27.0–33.0)
MCHC: 32.5 g/dL (ref 32.0–36.0)
MCV: 88.3 fL (ref 80.0–100.0)
MPV: 9.9 fL (ref 7.5–12.5)
Platelets: 310 10*3/uL (ref 140–400)
RBC: 4.63 MIL/uL (ref 3.80–5.10)
RDW: 14.4 % (ref 11.0–15.0)
WBC: 7.7 10*3/uL (ref 3.8–10.8)

## 2016-10-26 NOTE — Patient Instructions (Signed)
Continue all previous medications without any changes at this time  Schedule dental clinic evaluation as soon as practical  Discuss taking a diuretic (fluid pill) with your cardiologist  Schedule cardiac catheterization after you have been seen by your cardiologist if you plan to proceed with surgery

## 2016-10-26 NOTE — Progress Notes (Signed)
[address]       [place] [address]             [facility] [facility] REPORT  Referring Provider is Thompson Grayer, MD  Primary Cardiologist is [name], MD PCP is Hoyt Koch, MD  Chief Complaint  Patient presents with  . Mitral Regurgitation    Tricuspid Regurgitation and Persistenet AF.Marland KitchenMarland KitchenTEE 10/23/16    HPI:  Patient is a 55 year old morbidly obese female with history of persistent atrial fibrillation, hypertension, obstructive sleep apnea, peripheral arterial disease, post traumatic stress disorder, previous history of long-standing tobacco abuse, and recently discovered mitral regurgitation and tricuspid regurgitation who has been referred for surgical consultation to discuss treatment options for management of atrial fibrillation and symptoms of congestive heart failure.  The patient's history of atrial fibrillation dates back more than 5 years ago when she first presented with symptoms of palpitations and was diagnosed with paroxysmal atrial fibrillation. She was started on Pradaxa and followed for several years by Dr. Acie Fredrickson.  In 2010 she underwent bilateral common iliac artery stent placement by Dr. Trula Slade for bilateral hip claudication. She initially did well but later developed recurrent claudication and was referred to Dr. Fletcher Anon. 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 She has been followed intermittently ever since by Dr. Fletcher Anon.  In November 2016 the patient underwent hysterectomy. She developed atypical chest pain and worsening shortness breath and was later found to be in persistent atrial fibrillation.  An echocardiogram was performed demonstrating ejection fraction estimated 55-60% with dilated left atrium and what was felt to be mild mitral regurgitation. Stress Myoview exam was felt to be low risk for ischemia.  Systemic anticoagulation was resumed using Eliquis and medications were added for rate control. Patient developed worsening shortness of breath, PND, orthopnea, and lower extremity edema.  She was seen in follow-up by Dr. Fletcher Anon and underwent an attempted cardioversion that was unsuccessful. She was referred to the atrial fibrillation clinic.  She underwent a sleep study that confirmed the presence of obstructive sleep apnea and was started on CPAP.  She noted some improvement in her ability to sleep, but she continued to experience worsening shortness of breath, palpitations and episodes of PND.  She was started on flecainide and underwent a second attempt at cardioversion which failed.  She was seen in follow-up in the atrial fibrillation clinic by Dr. Rayann Heman discussed the possibility of catheter-based ablation. TEE was performed 10/13/2016 revealed the presence of moderate mitral regurgitation and severe tricuspid regurgitation. Plans for ablation were canceled and the patient was referred for surgical consultation.  The patient is married and lives with her husband in [place]. She has been disabled since 2002 secondary to post traumatic stress disorder caused by an on-the-job injury she suffered in 2002.  She states that up until approximately one year ago she remained reasonably active physically. Over the past year she has developed progressive symptoms of exertional shortness of breath, PND, orthopnea, and lower extremity edema. She complains of chronic pain in both feet which she blames on the swelling in her feet. Pain is described as sharp pain in sensations of "pins and needles" that is unrelated to ambulation. She also has pain in both hands which she attributes to swelling. She has gained approximately 40 pounds over the past year which she feels is primarily due to accumulation of  fluid. She denies any history of chest pain or chest tightness either with activity or at rest. She uses CPAP at night  and this does seem to help her sleep but she still cannot lay flat in bed and she has occasional episodes of shortness of breath that wakes her from her sleep gasping. She has had some abdominal bloating and vague discomfort although her bowel function is normal, appetite is stable, and she has no difficulty swallowing.  She has a history of long-standing tobacco abuse although she states that she quit smoking completely last March.   Past Medical History:  Diagnosis Date  . Anxiety   . Arthritis    "knees, back" (02/11/2016)  . Chronic diastolic CHF (congestive heart failure) ([place])   . Claudication ([place])       . Daily headache   . Depression   . Herpes    no outbreaks-has Valtrex to take PRN  . HTN (hypertension) 06/26/2011  . Hyperlipidemia   . Hypertension   . Migraine    "probably have 3/year; had one today; tried several medications-no relief" (02/11/2016)  . Mitral regurgitation   . Obstructive sleep apnea   . PAF (paroxysmal atrial fibrillation) ([place])   . Peripheral vascular disease ([place]) 06/11/2009   Bilateral common iliac kissing stents (8X24 mm) . 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 She underwent successful balloon angioplasty.  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   . Persistent atrial fibrillation ([place])   . PTSD (post-traumatic stress disorder)   . Shortness of breath dyspnea    patient states she has had lung test that came back normal  . Tobacco abuse    stopped smoking, 2016  . Tricuspid regurgitation     Past Surgical History:  Procedure Laterality Date  . ABDOMINAL ANGIOGRAM N/A 05/11/2012   Procedure: ABDOMINAL ANGIOGRAM;  Surgeon: [name], MD;  Location: [facility] CATH [facility];  Service: Cardiovascular;  Laterality: N/A;  . ABDOMINAL AORTAGRAM N/A 10/19/2012   Procedure: ABDOMINAL Maxcine Ham;  Surgeon:  [name], MD;  Location: [facility];  Service: Cardiovascular;  Laterality: N/A;  . ABDOMINAL AORTAGRAM N/A 05/10/2013   Procedure: ABDOMINAL Maxcine Ham;  Surgeon: [name], MD;  Location: [facility] CATH LAB;  Service: Cardiovascular;  Laterality: N/A;  . BACK SURGERY    . CARDIAC CATHETERIZATION  2009   no significant obstruction  . CARDIOVERSION N/A 03/19/2016   Procedure: CARDIOVERSION;  Surgeon: Pixie Casino, MD;  Location: [facility];  Service: Cardiovascular;  Laterality: N/A;  . CARDIOVERSION N/A 07/10/2016   Procedure: CARDIOVERSION;  Surgeon: Pixie Casino, MD;  Location: [facility] ENDOSCOPY;  Service: Cardiovascular;  Laterality: N/A;  . ILIAC ARTERY STENT Bilateral 06/2009   common iliac kissing stents (8X24 mm)  . KNEE ARTHROSCOPY Bilateral   . LUMBAR DISC SURGERY     spinal stenosis  . ROBOTIC ASSISTED TOTAL HYSTERECTOMY WITH SALPINGECTOMY Bilateral 09/23/2015   Procedure: ROBOTIC ASSISTED TOTAL HYSTERECTOMY BILATERALSALPINGECTOMY AND OOPHORECTOMY;  Surgeon: [name], MD;  Location: [facility];  Service: Gynecology;  Laterality: Bilateral;  . TEE WITHOUT CARDIOVERSION N/A 10/13/2016   Procedure: TRANSESOPHAGEAL ECHOCARDIOGRAM (TEE);  Surgeon: Lelon Perla, MD;  Location: [facility];  Service: Cardiovascular;  Laterality: N/A;  . [place]  . VULVECTOMY N/A 09/23/2015   Procedure: WIDE EXCISION VULVECTOMY  WLE of vulvar  VIN 2;  Surgeon: [name], MD;  Location: [facility];  Service: Gynecology;  Laterality: N/A;    Family History  Problem Relation Age of Onset  . Hypertension Mother   . Ovarian cancer Mother   . Lung cancer Mother   . Heart disease Father   . Heart attack Father   . Hypertension Brother   . Colon polyps Sister   . Cancer Maternal Grandfather     lung  . Cancer Paternal Grandfather     throat     Social History   Social History  . Marital status: Single    Spouse name: N/A  . Number of children: 3  . Years of education: N/A    Occupational History  . Not on file.   Social History Main Topics  . Smoking status: Former Smoker    Packs/day: 0.10    Years: 37.00    Types: Cigarettes    Quit date: 11/10/2015  . Smokeless tobacco: Never Used     Comment: 02/11/2016 "using electronic cigarettes"  . Alcohol use No  . Drug use: No  . Sexual activity: Yes    Partners: Male   Other Topics Concern  . Not on file   Social History Narrative  . No narrative on file    Current Outpatient Prescriptions  Medication Sig Dispense Refill  . acetaminophen (TYLENOL) 325 MG tablet Take 2 tablets (650 mg total) by mouth every 6 (six) hours as needed for mild pain (or Fever >/= 101).    [name] apixaban (ELIQUIS) 5 MG TABS tablet Take 1 tablet (5 mg total) by mouth 2 (two) times daily. 180 tablet 3  . atorvastatin (LIPITOR) 40 MG tablet Take 1 tablet (40 mg total) by mouth daily. 90 tablet 3  . diltiazem (CARDIZEM CD) 180 MG 24 hr capsule Take 1 capsule (180 mg total) by mouth daily. 90 capsule 3  . esomeprazole (NEXIUM) 20 MG capsule Take 20 mg by mouth at bedtime.     . fish oil-omega-3 fatty acids 1000 MG capsule Take 1 g by mouth at bedtime.     . metoprolol succinate (TOPROL XL) 50 MG 24 hr tablet Take 1-2 tablets (50-100 mg total) by mouth daily. Okay to take additional 0.5-1 tablet daily as needed. 270 tablet 3   No current facility-administered medications for this visit.     Allergies  Allergen Reactions  . Pradaxa [Dabigatran Etexilate Mesylate]     Gastritis   . Zocor [Simvastatin]     Leg pains       Review of Systems:   General:  normal appetite, decreased energy, + weight gain, no weight loss, no fever  Cardiac:  no chest pain with exertion, no chest pain at rest, +SOB with exertion, + resting SOB, + PND, + orthopnea, + palpitations, + arrhythmia, + atrial fibrillation, + LE edema, + dizzy spells, no syncope  Respiratory:  + shortness of breath, no home oxygen, no productive cough, no dry cough, no  bronchitis, no wheezing, no hemoptysis, no asthma, no pain with inspiration or cough, + sleep apnea, + CPAP at night  GI:   no difficulty swallowing, no reflux, no frequent heartburn, no hiatal hernia, mild abdominal pain, no constipation, no diarrhea, no hematochezia, no hematemesis, no melena  GU:   no dysuria,  + frequency, no urinary tract infection, no hematuria, no kidney stones, no kidney disease  Vascular:  + pain suggestive of claudication, + pain in feet, no leg cramps, no varicose  veins, no DVT, no non-healing foot ulcer  Neuro:   no stroke, no TIA's, no seizures, + headaches, no temporary blindness one eye,  no slurred speech, + possible peripheral neuropathy, + chronic pain, no instability of gait, no memory/cognitive dysfunction  Musculoskeletal: no arthritis, no joint swelling, + myalgias, + difficulty walking, somewhat decreased mobility   Skin:   no rash, no itching, + skin infections, no pressure sores or ulcerations  Psych:   + anxiety, + depression, no nervousness, no unusual recent stress  Eyes:   + blurry vision, no floaters, no recent vision changes, does not wears glasses or contacts  ENT:   no hearing loss, + loose or painful teeth, + upper plate dentures, last saw dentist many years ago  Hematologic:  no easy bruising, no abnormal bleeding, no clotting disorder, no frequent epistaxis  Endocrine:  no diabetes, does not check CBG's at home     Physical Exam:   BP 130/70 (BP Location: Left Arm, Patient Position: Sitting, Cuff Size: Large)   Pulse 96 Comment: IRREG  Resp 16   Ht 5\' 6"  (1.676 m)   Wt 225 lb (102.1 kg)   LMP 11/09/2008   SpO2 96% Comment: ON RA  BMI 36.32 kg/m   General:  Obese,  well-appearing  HEENT:  Unremarkable   Neck:   no JVD, no bruits, no adenopathy   Chest:   clear to auscultation, symmetrical breath sounds, no wheezes, no rhonchi   CV:   Irregular rate and rhythm, no murmur   Abdomen:  soft, non-tender, no masses   Extremities:  warm,  well-perfused, pulses not palpable, + bilateral LE edema  Rectal/GU  Deferred  Neuro:   Grossly non-focal and symmetrical throughout  Skin:   Clean and dry, no rashes, no breakdown   Diagnostic Tests:  Transthoracic Echocardiography  Patient:    Kayla Hopkins, Kayla Hopkins MR #:       [id number] Study Date: 02/11/2016 Gender:     F Age:        59 Height:     167.6 cm Weight:     88.8 kg BSA:        2.06 m^2 Pt. Status: Room:       [place], [place]  Wilson, Scott T  PERFORMING   [facility], Inpatient  cc:  ------------------------------------------------------------------- LV EF: 55% -   60%  ------------------------------------------------------------------- Indications:      Chest pain 786.51.  ------------------------------------------------------------------- History:   PMH:   Atrial fibrillation.  Risk factors:  Current tobacco use. Hypertension. Dyslipidemia.  ------------------------------------------------------------------- Study Conclusions  - Left ventricle: The cavity size was normal. Wall thickness was   normal. Systolic function was normal. The estimated ejection   fraction was in the range of 55% to 60%. Wall motion was normal;   there were no regional wall motion abnormalities. - Mitral valve: There was mild to moderate regurgitation. - Left atrium: The atrium was moderately dilated. - Pulmonary arteries: Systolic pressure was moderately increased.   PA peak pressure: 51 mm Hg (S). - Pericardium, extracardiac: A trivial pericardial effusion was   identified.  Impressions:  - Normal LV systolic function; moderate LAE; mild to moderate MR;   mild TR; moderately elevated pulmonary pressure.  Transthoracic echocardiography.  M-mode, complete 2D, spectral Doppler, and color Doppler.  Birthdate:  Patient birthdate: Jan 13, 1961.  Age:  Patient  is 55 yr  old.  Sex:  Gender: female. BMI: 31.6 kg/m^2.  Blood pressure:     150/87  Patient status: Inpatient.  Study date:  Study date: 02/11/2016. Study time: 02:24 PM.  Location:  Bedside.  -------------------------------------------------------------------  ------------------------------------------------------------------- Left ventricle:  The cavity size was normal. Wall thickness was normal. Systolic function was normal. The estimated ejection fraction was in the range of 55% to 60%. Wall motion was normal; there were no regional wall motion abnormalities. The study was not technically sufficient to allow evaluation of LV diastolic dysfunction due to atrial fibrillation.  ------------------------------------------------------------------- Aortic valve:   Trileaflet; mildly calcified leaflets. Mobility was not restricted.  Doppler:  Transvalvular velocity was within the normal range. There was no stenosis. There was no regurgitation.   ------------------------------------------------------------------- Aorta:  Aortic root: The aortic root was normal in size.  ------------------------------------------------------------------- Mitral valve:   Structurally normal valve.   Mobility was not restricted.  Doppler:  Transvalvular velocity was within the normal range. There was no evidence for stenosis. There was mild to moderate regurgitation.  ------------------------------------------------------------------- Left atrium:  The atrium was moderately dilated.  ------------------------------------------------------------------- Right ventricle:  The cavity size was normal. Systolic function was normal.  ------------------------------------------------------------------- Pulmonic valve:    Doppler:  Transvalvular velocity was within the normal range. There was no evidence for stenosis.  ------------------------------------------------------------------- Tricuspid valve:    Structurally normal valve.    Doppler: Transvalvular velocity was within the normal range. There was mild regurgitation.  ------------------------------------------------------------------- Pulmonary artery:   Systolic pressure was moderately increased.  ------------------------------------------------------------------- Right atrium:  The atrium was normal in size.  ------------------------------------------------------------------- Pericardium:  A trivial pericardial effusion was identified.  ------------------------------------------------------------------- Systemic veins: Inferior vena cava: The vessel was normal in size.  ------------------------------------------------------------------- Measurements   Left ventricle                         Value        Reference  LV ID, ED, PLAX chordal        (L)     37.9  mm     43 - 52  LV ID, ES, PLAX chordal                25.2  mm     23 - 38  LV fx shortening, PLAX chordal         34    %      >=29  LV PW thickness, ED                    8.25  mm     ---------  IVS/LV PW ratio, ED            (H)     1.35         <=1.3    Ventricular septum                     Value        Reference  IVS thickness, ED                      11.1  mm     ---------    Aorta                                  Value        Reference  Aortic root ID, ED                     25    mm     ---------    Left atrium                            Value        Reference  LA ID, A-P, ES                         43    mm     ---------  LA ID/bsa, A-P                         2.08  cm/m^2 <=2.2  LA volume, S                           77    ml     ---------  LA volume/bsa, S                       37.3  ml/m^2 ---------  LA volume, ES, 1-p A4C                 83    ml     ---------  LA volume/bsa, ES, 1-p A4C             40.2  ml/m^2 ---------  LA volume, ES, 1-p A2C                 71    ml     ---------  LA volume/bsa, ES, 1-p A2C             34.4  ml/m^2  ---------    Pulmonary arteries                     Value        Reference  PA pressure, S, DP             (H)     51    mm Hg  <=30    Tricuspid valve                        Value        Reference  Tricuspid regurg peak velocity         327   cm/s   ---------  Tricuspid peak RV-RA gradient          43    mm Hg  ---------    Systemic veins                         Value        Reference  Estimated CVP                          8     mm Hg  ---------    Right ventricle                        Value        Reference  RV pressure, S, DP             (H)  51    mm Hg  <=30  Legend: (L)  and  (H)  mark values outside specified reference range.  ------------------------------------------------------------------- Prepared and Electronically Authenticated by  Kirk Ruths 2017-04-04T16:24:03   Transesophageal Echocardiography  Patient:    Saroya, Adams MR #:       [id number] Study Date: 10/13/2016 Gender:     F Age:        77 Height:     167.6 cm Weight:     100 kg BSA:        2.2 m^2 Pt. Status: Room:   [facility]  PERFORMING   Kirk Ruths  SONOGRAPHER  Johny Chess, RDCS, CCT  Emelia Loron, Amber  cc:  ------------------------------------------------------------------- LV EF: 55% -   60%  ------------------------------------------------------------------- Indications:      Atrial fibrillation - 427.31.  ------------------------------------------------------------------- Study Conclusions  - Left ventricle: Systolic function was normal. The estimated   ejection fraction was in the range of 55% to 60%. Wall motion was   normal; there were no regional wall motion abnormalities. - Aortic valve: No evidence of vegetation. - Mitral valve: No evidence of vegetation. There was moderate   regurgitation. - Left atrium: The atrium was moderately dilated. No evidence of   thrombus in the atrial  cavity or appendage. There was moderate   spontaneous echo contrast (&quot;smoke&quot;) in the cavity and the   appendage. - Right atrium: The atrium was mildly dilated. - Atrial septum: No defect or patent foramen ovale was identified. - Tricuspid valve: No evidence of vegetation. There was severe   regurgitation. - Pulmonic valve: No evidence of vegetation. - Pericardium, extracardiac: A trivial pericardial effusion was   identified.  Impressions:  - Normal LV systolic function; biatrial enlargement; no LAA   thrombus; moderate MR; severe TR.  ------------------------------------------------------------------- Study data:   Study status:  Routine.  Consent:  The risks, benefits, and alternatives to the procedure were explained to the patient and informed consent was obtained.  Procedure:  Initial setup. The patient was brought to the laboratory. Surface ECG leads were monitored. Sedation. Conscious sedation was administered by cardiology staff. Transesophageal echocardiography. An adult multiplane transesophageal probe was inserted by the attending cardiologistwithout difficulty. Image quality was adequate.  Study completion:  The patient tolerated the procedure well. There were no complications.  Administered medications:   Midazolam, 4mg , IV. Fentanyl, 93mcg, IV.          Diagnostic transesophageal echocardiography.  2D and color Doppler.  Birthdate:  Patient birthdate: 1961-10-11.  Age:  Patient is 55 yr old.  Sex:  Gender: female.    BMI: 35.6 kg/m^2.  Blood pressure:     156/79  Patient status:  Outpatient.  Study date:  Study date: 10/13/2016. Study time: 10:00 AM.  Location:  Endoscopy.  -------------------------------------------------------------------  ------------------------------------------------------------------- Left ventricle:  Systolic function was normal. The estimated ejection fraction was in the range of 55% to 60%. Wall motion was normal; there  were no regional wall motion abnormalities.  ------------------------------------------------------------------- Aortic valve:   Mildly thickened leaflets. Cusp separation was normal.  No evidence of vegetation.  Doppler:  There was no regurgitation.  ------------------------------------------------------------------- Aorta:  Descending aorta: The descending aorta had mild diffuse disease.  ------------------------------------------------------------------- Mitral valve:   Mildly thickened leaflets . Leaflet separation was normal.  No evidence of vegetation.  Doppler:  There was moderate regurgitation.  ------------------------------------------------------------------- Left atrium:  The atrium was  moderately dilated.  No evidence of thrombus in the atrial cavity or appendage. There was moderate spontaneous echo contrast (&quot;smoke&quot;) in the cavity and the appendage.  ------------------------------------------------------------------- Atrial septum:  No defect or patent foramen ovale was identified.   ------------------------------------------------------------------- Right ventricle:  The cavity size was normal. Systolic function was normal.  ------------------------------------------------------------------- Pulmonic valve:    Structurally normal valve.   Cusp separation was normal.  No evidence of vegetation.  Doppler:  There was trivial regurgitation.  ------------------------------------------------------------------- Tricuspid valve:   Structurally normal valve.   Leaflet separation was normal.  No evidence of vegetation.  Doppler:  There was severe regurgitation.  ------------------------------------------------------------------- Right atrium:  The atrium was mildly dilated.  ------------------------------------------------------------------- Pericardium:  A trivial pericardial effusion was identified.     ------------------------------------------------------------------- Prepared and Electronically Authenticated by  Kirk Ruths 2017-12-05T15:58:25   Impression:  Patient has a long history of atrial fibrillation dating back more than 5 years ago. She has remained in persistent atrial fibrillation for at least 9 months and has failed attempts at DC cardioversion on 2 occasions, most recently on oral flecainide. She describes worsening symptoms of exertional shortness of breath, fatigue, orthopnea, PND, and lower extremity edema consistent with chronic diastolic congestive heart failure, New York Heart Association functional class III. The patient's long-standing symptoms of shortness of breath are likely further exacerbated by the presence of morbid obesity, obstructive sleep apnea, and history of long-standing tobacco abuse, although she was able to quit smoking last March. She has not been on appropriate medical therapy for congestive heart failure.   I have personally reviewed the patient's recent transesophageal echocardiogram.  The patient has mild left ventricular hypertrophy with some degree of diastolic dysfunction but low normal left ventricular systolic function with ejection fraction estimated 55-60% in the setting of significant mitral regurgitation.  There is smoke in the left atrium but no flow reversal in the pulmonary veins. There is severe left atrial enlargement, severe right atrial enlargement, and severe tricuspid regurgitation.  The right ventricle was not dilated and there appeared to be normal right ventricular systolic function.  There appears to be mild thickening of the mitral valve leaflets but essentially normal leaflet mobility with primarily type I mitral valve dysfunction and at least moderate mitral regurgitation. 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  I agree that the likelihood of successful restoration of atrial fibrillation using catheter-based ablation would probably be relatively low. Options at this time include an attempt at more aggressive medical therapy for congestive heart failure versus mitral valve repair, tricuspid valve repair, and Maze procedure.  It might be reasonable to consider an attempt at a more aggressive approach to medical therapy for congestive heart failure, but this will not restore sinus rhythm. Given the patient's relatively young age, a more aggressive approach might be reasonable.    Plan:  I have discussed the nature of the patient's multiple ongoing problems at length with the patient in the office today. We reviewed the results of her recent TEE and discussed the findings at length. We discussed long-term options for management of persistent atrial fibrillation and the importance of associated structural heart disease. The potential value of aggressive medical therapy for treatment of congestive heart failure has been discussed. All of her questions have been addressed.  The patient is interested in proceeding with an aggressive approach to her care, including possible surgical intervention. She is scheduled for  routine follow-up with Dr. Fletcher Anon in the near future. I have instructed her to discuss the potential benefits of starting a diuretic and possibly adjusting other medications to further optimize her treatment for congestive heart failure. I have instructed her to discuss the results of her TEE with him and consider whether or not she might benefit from surgical intervention.  She will need to undergo left and right heart catheterization if surgery is to be considered. Finally, the patient will be referred to the dental clinic for evaluation. She may need dental extraction performed. Patient will return to our office in approximately 4 weeks to discuss treatment options further. All of her questions  have been addressed.   I spent in excess of 90 minutes during the conduct of this office consultation and >50% of this time involved direct face-to-face encounter with the patient for counseling and/or coordination of their care.    Valentina Gu. Roxy Manns, MD 10/26/2016 1:01 PM

## 2016-10-27 LAB — COMPREHENSIVE METABOLIC PANEL
ALBUMIN: 4.4 g/dL (ref 3.6–5.1)
ALK PHOS: 94 U/L (ref 33–130)
ALT: 20 U/L (ref 6–29)
AST: 17 U/L (ref 10–35)
BUN: 19 mg/dL (ref 7–25)
CALCIUM: 9.7 mg/dL (ref 8.6–10.4)
CHLORIDE: 102 mmol/L (ref 98–110)
CO2: 27 mmol/L (ref 20–31)
Creat: 0.88 mg/dL (ref 0.50–1.05)
Glucose, Bld: 92 mg/dL (ref 65–99)
POTASSIUM: 4.4 mmol/L (ref 3.5–5.3)
Sodium: 140 mmol/L (ref 135–146)
TOTAL PROTEIN: 7.5 g/dL (ref 6.1–8.1)
Total Bilirubin: 0.4 mg/dL (ref 0.2–1.2)

## 2016-10-27 LAB — BRAIN NATRIURETIC PEPTIDE: BRAIN NATRIURETIC PEPTIDE: 138.4 pg/mL — AB (ref <100)

## 2016-10-27 LAB — HEMOGLOBIN A1C
HEMOGLOBIN A1C: 5.8 % — AB (ref <5.7)
MEAN PLASMA GLUCOSE: 120 mg/dL

## 2016-10-28 DIAGNOSIS — G4733 Obstructive sleep apnea (adult) (pediatric): Secondary | ICD-10-CM | POA: Diagnosis not present

## 2016-10-29 ENCOUNTER — Encounter (HOSPITAL_COMMUNITY): Payer: Self-pay | Admitting: Dentistry

## 2016-10-29 ENCOUNTER — Ambulatory Visit (HOSPITAL_COMMUNITY): Payer: Self-pay | Admitting: Dentistry

## 2016-10-29 VITALS — BP 113/78 | HR 69 | Temp 97.7°F

## 2016-10-29 DIAGNOSIS — K08409 Partial loss of teeth, unspecified cause, unspecified class: Secondary | ICD-10-CM

## 2016-10-29 DIAGNOSIS — Z01818 Encounter for other preprocedural examination: Secondary | ICD-10-CM | POA: Diagnosis not present

## 2016-10-29 DIAGNOSIS — M264 Malocclusion, unspecified: Secondary | ICD-10-CM

## 2016-10-29 DIAGNOSIS — I340 Nonrheumatic mitral (valve) insufficiency: Secondary | ICD-10-CM | POA: Diagnosis not present

## 2016-10-29 DIAGNOSIS — I071 Rheumatic tricuspid insufficiency: Secondary | ICD-10-CM | POA: Diagnosis not present

## 2016-10-29 DIAGNOSIS — Z7901 Long term (current) use of anticoagulants: Secondary | ICD-10-CM | POA: Diagnosis not present

## 2016-10-29 DIAGNOSIS — K036 Deposits [accretions] on teeth: Secondary | ICD-10-CM

## 2016-10-29 DIAGNOSIS — K0889 Other specified disorders of teeth and supporting structures: Secondary | ICD-10-CM

## 2016-10-29 DIAGNOSIS — Z972 Presence of dental prosthetic device (complete) (partial): Secondary | ICD-10-CM

## 2016-10-29 DIAGNOSIS — K06020 Generalized gingival recession, unspecified: Secondary | ICD-10-CM

## 2016-10-29 DIAGNOSIS — M270 Developmental disorders of jaws: Secondary | ICD-10-CM | POA: Insufficient documentation

## 2016-10-29 DIAGNOSIS — K0530 Chronic periodontitis, unspecified: Secondary | ICD-10-CM | POA: Insufficient documentation

## 2016-10-29 NOTE — Patient Instructions (Signed)
[facility]    Department of Dental Medicine     DR. Kai Calico      HEART VALVES AND MOUTH CARE:  FACTS:   If you have any infection in your mouth, it can infect your heart valve.  If you heart valve is infected, you will be seriously ill.  Infections in the mouth can be SILENT and do not always cause pain.  Examples of infections in the mouth are gum disease, dental cavities, and abscesses.  Some possible signs of infection are: Bad breath, bleeding gums, or teeth that are sensitive to sweets, hot, and/or cold. There are many other signs as well.  WHAT YOU HAVE TO DO:   Brush your teeth after meals and at bedtime. Spend at least 2 minutes brushing well, especially behind your back teeth and all around your teeth that stand alone. Brush at the gumline also.  Do not go to bed without brushing your teeth and flossing.  If you gums bleed when you brush or floss, do NOT stop brushing or flossing. It usually means that your gums need more attention and better cleaning.   If your Dentist or Dr. Madden Garron gave you a prescription mouthwash to use, make sure to use it as directed. If you run out of the medication, get a refill at the pharmacy.   If you were given any other medications or directions by your Dentist, please follow them. If you did not understand the directions or forget what you were told, please call. We will be happy to refresh her memory.  If you need antibiotics before dental procedures, make sure you take them one hour prior to every dental visit as directed.   Get a dental checkup every 4-6 months in order to keep your mouth healthy, or to find and treat any new infection. You will most likely need your teeth cleaned or gums treated at the same time.  If you are not able to come in for your scheduled appointment, call your Dentist as soon as possible to reschedule.  If you have a problem in between dental visits, call your Dentist.  

## 2016-10-29 NOTE — Progress Notes (Signed)
DENTAL CONSULTATION  Date of Consultation:  10/29/2016 Patient Name:   Kayla Hopkins Date of Birth:   08/28/1961 Medical Record Number: [id number]  VITALS: BP 113/78 (BP Location: Left Arm)   Pulse 69   Temp 97.7 F (36.5 C) (Oral)   LMP 11/09/2008   CHIEF COMPLAINT: Patient referred by Dr. Roxy Manns for dental consultation.  HPI: Kayla Hopkins is a 55 year old female recently diagnosed with severe tricuspid regurgitation and mitral regurgitation. Patient with anticipated tricuspid valve and mitral valve repair with possible Maze procedure in the future. Patient is now seen as part of a medically necessary pre-heart valve surgery dental protocol examination to rule out dental infection that may affect the patient's systemic health and anticipated heart valve surgery.  The patient currently denies acute toothaches, swellings, or abscesses. Patient was last seen in 2002 for extractions and insertion of an immediate upper denture by [facility]. Patient then had a new lower denture fabricated approximately one year later by [facility]. Patient denies having a lower partial denture. Patient denies having regular dental care for periodontal therapy. Patient denies having dental phobia.  PROBLEM LIST: Patient Active Problem List   Diagnosis Date Noted  . Mitral regurgitation     Priority: High  . Tricuspid regurgitation     Priority: High  . Obstructive sleep apnea   . Chronic diastolic CHF (congestive heart failure) ([place])   . Persistent atrial fibrillation ([place])   . Chest pressure 02/10/2016  . Tobacco use 02/10/2016  . BPPV (benign paroxysmal positional vertigo) 11/19/2015  . History of smoking 10/28/2015  . 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 Intractable migraine with aura without status migrainosus 03/18/2015  . Medication overuse headache 03/18/2015  . Joint pain 01/24/2015  . SOB (shortness of breath) on exertion 01/24/2015  .  Non-cardiac chest pain 03/09/2014  . Postlaminectomy syndrome, lumbar region 01/24/2013  . Peripheral vascular disease ([place])   . HTN (hypertension) 06/26/2011  . Hyperlipidemia 06/26/2011    PMH: Past Medical History:  Diagnosis Date  . Anxiety   . Arthritis    "knees, back" (02/11/2016)  . Chronic diastolic CHF (congestive heart failure) ([place])   . Claudication ([place])       . Daily headache   . Depression   . Herpes    no outbreaks-has Valtrex to take PRN  . HTN (hypertension) 06/26/2011  . Hyperlipidemia   . Hypertension   . Migraine    "probably have 3/year; had one today; tried several medications-no relief" (02/11/2016)  . Mitral regurgitation   . Obstructive sleep apnea   . PAF (paroxysmal atrial fibrillation) ([place])   . Peripheral vascular disease ([place]) 06/11/2009   Bilateral common iliac kissing stents (8X24 mm) . 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 She underwent successful balloon angioplasty.  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   . Persistent atrial fibrillation ([place])   . PTSD (post-traumatic stress disorder)   . Shortness of breath dyspnea    patient states she has had lung test that came back normal  . Tobacco abuse    stopped smoking, 2016  . Tricuspid regurgitation     PSH: Past Surgical History:  Procedure Laterality Date  . ABDOMINAL ANGIOGRAM N/A 05/11/2012   Procedure: ABDOMINAL ANGIOGRAM;  Surgeon: [name], MD;  Location: [facility] CATH LAB;  Service: Cardiovascular;  Laterality: N/A;  . ABDOMINAL AORTAGRAM N/A 10/19/2012   Procedure: ABDOMINAL AORTAGRAM;  Surgeon: [name], MD;  Location: [facility] CATH LAB;  Service: Cardiovascular;  Laterality: N/A;  . ABDOMINAL AORTAGRAM N/A 05/10/2013   Procedure: ABDOMINAL Maxcine Ham;  Surgeon: [name], MD;  Location: [facility] CATH LAB;  Service:  Cardiovascular;  Laterality: N/A;  . BACK SURGERY    . CARDIAC CATHETERIZATION  2009   no significant obstruction  . CARDIOVERSION N/A 03/19/2016   Procedure: CARDIOVERSION;  Surgeon: Pixie Casino, MD;  Location: [facility] ENDOSCOPY;  Service: Cardiovascular;  Laterality: N/A;  . CARDIOVERSION N/A 07/10/2016   Procedure: CARDIOVERSION;  Surgeon: Pixie Casino, MD;  Location[facility]COPY;  Service: Cardiovascular;  Laterality: N/A;  . ILIAC ARTERY STENT Bilateral 06/2009   common iliac kissing stents (8X24 mm)  . KNEE ARTHROSCOPY Bilateral   . LUMBAR DISC SURGERY     spinal stenosis  . ROBOTIC ASSISTED TOTAL HYSTERECTOMY WITH SALPINGECTOMY Bilateral 09/23/2015   Procedure: ROBOTIC ASSISTED TOTAL HYSTERECTOMY BILATERALSALPINGECTOMY AND OOPHORECTOMY;  Surgeon: [name], MD;  Location: [facility];  Service: Gynecology;  Laterality: Bilateral;  . TEE WITHOUT CARDIOVERSION N/A 10/13/2016   Procedure: TRANSESOPHAGEAL ECHOCARDIOGRAM (TEE);  Surgeon: Lelon Perla, MD;  Location: [facility];  Service: Cardiovascular;  Laterality: N/A;  . [place]  . VULVECTOMY N/A 09/23/2015   Procedure: WIDE EXCISION VULVECTOMY  WLE of vulvar VIN 2;  Surgeon: [name], MD;  Location: [facility];  Service: Gynecology;  Laterality: N/A;    ALLERGIES: Allergies  Allergen Reactions  . Pradaxa [Dabigatran Etexilate Mesylate]     Gastritis   . Zocor [Simvastatin]     Leg pains     MEDICATIONS: Current Outpatient Prescriptions  Medication Sig Dispense Refill  . acetaminophen (TYLENOL) 325 MG tablet Take 2 tablets (650 mg total) by mouth every 6 (six) hours as needed for mild pain (or Fever >/= 101).    [name] apixaban (ELIQUIS) 5 MG TABS tablet Take 1 tablet (5 mg total) by mouth 2 (two) times daily. 180 tablet 3  . atorvastatin (LIPITOR) 40 MG tablet Take 1 tablet (40 mg total) by mouth daily. 90 tablet 3  . diltiazem (CARDIZEM CD) 180 MG 24 hr capsule Take 1 capsule (180 mg total) by mouth daily. 90  capsule 3  . esomeprazole (NEXIUM) 20 MG capsule Take 20 mg by mouth at bedtime.     . fish oil-omega-3 fatty acids 1000 MG capsule Take 1 g by mouth at bedtime.     . metoprolol succinate (TOPROL XL) 50 MG 24 hr tablet Take 1-2 tablets (50-100 mg total) by mouth daily. Okay to take additional 0.5-1 tablet daily as needed. 270 tablet 3   No current facility-administered medications for this visit.     LABS: Lab Results  Component Value Date   WBC 7.7 10/26/2016   HGB 13.3 10/26/2016   HCT 40.9 10/26/2016   MCV 88.3 10/26/2016   PLT 310 10/26/2016      Component Value Date/Time   NA 140 10/26/2016 1344   K 4.4 10/26/2016 1344   CL 102 10/26/2016 1344   CO2 27 10/26/2016 1344   GLUCOSE 92 10/26/2016 1344   BUN 19 10/26/2016 1344   CREATININE 0.88 10/26/2016 1344   CALCIUM 9.7 10/26/2016 1344   GFRNONAA >60 07/08/2016 1155   GFRAA >60 07/08/2016 1155   Lab Results  Component Value Date   INR 1.11 03/13/2016   INR 1.0 05/02/2013  INR 1.0 05/04/2012   No results found for: PTT  SOCIAL HISTORY: Social History   Social History  . Marital status: Single    Spouse name: N/A  . Number of children: 3  . Years of education: N/A   Occupational History  . Not on file.   Social History Main Topics  . Smoking status: Former Smoker    Packs/day: 0.10    Years: 37.00    Types: Cigarettes    Quit date: 11/10/2015  . Smokeless tobacco: Never Used     Comment: 02/11/2016 "using electronic cigarettes"  . Alcohol use No  . Drug use: No  . Sexual activity: Yes    Partners: Male   Other Topics Concern  . Not on file   Social History Narrative  . No narrative on file    FAMILY HISTORY: Family History  Problem Relation Age of Onset  . Hypertension Mother   . Ovarian cancer Mother   . Lung cancer Mother   . Heart disease Father   . Heart attack Father   . Hypertension Brother   . Colon polyps Sister   . Cancer Maternal Grandfather     lung  . Cancer Paternal  Grandfather     throat     REVIEW OF SYSTEMS: Reviewed Review of systems from Dr. Guy Sandifer note dated 10/26/2016 with the patient. Changes noted in bold.   Review of Systems:              General:                      normal appetite, decreased energy, + weight gain, no weight loss, no fever             Cardiac:                       no chest pain with exertion, no chest pain at rest, +SOB with exertion, + resting SOB, + PND, + orthopnea, + palpitations, + arrhythmia,     + atrial fibrillation, + LE edema, + dizzy spells, no syncope             Respiratory:                 + shortness of breath, no home oxygen, no productive cough, no dry cough, no bronchitis, no wheezing, no hemoptysis, no asthma, no pain     with inspiration or cough, + sleep apnea, + CPAP at night             GI:                               no difficulty swallowing, patient with occasional reflux symptoms, no frequent heartburn, no hiatal hernia, mild abdominal pain,     no constipation, no diarrhea, no hematemesis, no melena             GU:                              no dysuria,  + frequency, no urinary tract infection, no hematuria, no kidney stones, no kidney disease             Vascular:                     + pain suggestive of claudication, +  pain in feet, no leg cramps, no varicose veins, no DVT, no non-healing foot ulcer             Neuro:                         no stroke, no TIA's, no seizures, + headaches, no temporary blindness one eye,  no slurred speech, + possible peripheral neuropathy,     + chronic pain, no instability of gait, no memory/cognitive dysfunction             Musculoskeletal:         no arthritis, no joint swelling, + myalgias, + difficulty walking, somewhat decreased mobility              Skin:                            no rash, no itching, + skin infections, no pressure sores or ulcerations             Psych:                         + anxiety, + depression, no nervousness, no unusual recent  stress             Eyes:                           + blurry vision, no floaters, no recent vision changes, does not wears glasses or contacts             ENT:                            no hearing loss, patient denies dental phobia, patient has an upper complete denture, patient has no lower partial denture.     Patient last saw the dentist in approximately 2003.               Hematologic:               no easy bruising, no abnormal bleeding, no clotting disorder, no frequent epistaxis             Endocrine:                   no diabetes, does not check CBG's at home   DENTAL HISTORY: CHIEF COMPLAINT: Patient referred by Dr. Roxy Manns for dental consultation.  HPI: Kayla Hopkins is a 55 year old female recently diagnosed with severe tricuspid regurgitation and mitral regurgitation. Patient with anticipated tricuspid valve and mitral valve repair with possible Maze procedure in the future. Patient is now seen as part of a medically necessary pre-heart valve surgery dental protocol examination to rule out dental infection that may affect the patient's systemic health and anticipated heart valve surgery.  The patient currently denies acute toothaches, swellings, or abscesses. Patient was last seen in 2002 for extractions and insertion of an immediate upper denture by [facility]. Patient then had a new lower denture fabricated approximately one year later by [facility]. Patient denies having a lower partial denture. Patient denies having regular dental care for periodontal therapy. Patient denies having dental phobia.  DENTAL EXAMINATION: GENERAL: The patient is a well-developed, well-nourished female in no acute distress. HEAD AND NECK: Patient has multiple moles and skin tags on her face  with no recent changes. There is no palpable neck lymphadenopathy. The patient denies acute TMJ symptoms INTRAORAL EXAM: Patient has normal saliva. Patient has a mid palatal torus. Patient has  bilateral mandibular lingual tori. There is no evidence of oral abscess formation. DENTITION: Patient is missing tooth numbers 1-16, 17, 19, 30, and 32. PERIODONTAL: Patient has chronic, advanced periodontal disease with plaque and calculus accumulations, generalized gingival recession, generalized tooth mobility, and severe bone loss.  DENTAL CARIES/SUBOPTIMAL RESTORATIONS:No dental caries are noted.  ENDODONTIC: Patient currently denies acute pulpitis symptoms. I do not see any evidence of periapical pathology  CROWN AND BRIDGE: There are no crown or bridge restorations.  PROSTHODONTIC: Patient has upper complete denture with less than ideal stability. Patient uses denture adhesive to keep denture in place.  OCCLUSION: Patient has a class II occlusion of the upper denture against the lower natural dentition.  RADIOGRAPHIC INTERPRETATION: An orthopantogram was taken and supplemented with 7 lower periapical radiographs. There are multiple missing teeth. There is moderate to severe bone loss. Radiographic calculus is noted. There is supra-eruption and drifting of the unopposed teeth into the edentulous areas. There is atrophy of the edentulous alveolar ridges. There are no obvious periapical radiolucencies.  ASSESSMENTS: 1. Severe tricuspid regurgitation and mitral valve regurgitation next line  2. Anticoagulation with Eliquis 3. Pre-heart valve surgery dental protocol 4. Chronic periodontitis with bone loss 5. Generalized gingival recession 6. Generalized tooth mobility 7. Accretions 8. Bilateral mandibular lingual tori 9. Mid palatal torus  10. Multiple missing teeth 11. Upper complete denture with less than ideal stability and retention 12. Poor occlusal scheme and malocclusion 13. Risk for bleeding with invasive dental procedures while on Eliquis therapy 14. Risk for complications up to and including death due to overall cardiovascular and respiratory  compromise.  PLAN/RECOMMENDATIONS: 1. I discussed the risks, benefits, and complications of various treatment options with the patient in relationship to her medical and dental conditions, anticipated heart valve surgeries, and risk for endocarditis. We discussed various treatment options to include no treatment, multiple extractions with alveoloplasty, pre-prosthetic surgery as indicated, periodontal therapy, dental restorations, root canal therapy, crown and bridge therapy, implant therapy, and replacement of missing teeth as indicated. The patient currently wishes to proceed with extraction of remaining teeth with alveoloplasty and pre-prosthetic surgery as needed in the operating room with general anesthesia. This has been scheduled for 11/12/2016 at [facility] [place] at 7:30 AM.  After a discussion with Dr. Fletcher Anon, the patient has been instructed to stop taking her Eliquis therapy after her Monday evening dose on 11/09/2016.  Presurgical testing has been contacted and is to arrange a presurgical testing appointment for 11/11/2016. After adequate healing and once cleared from her anticipated heart valve surgery, the patient will follow-up with a dentist of her choice for fabrication of new upper and lower complete dentures.  2. Discussion of findings with medical team and coordination of future medical and dental care as needed.  I spent in excess of 120 minutes during the conduct of this consultation and >50% of this time involved direct face-to-face encounter for counseling and/or coordination of the patient's care.    Lenn Cal, DDS

## 2016-10-29 NOTE — Addendum Note (Signed)
Addended by: Teena Dunk F on: 10/29/2016 11:00 AM   Modules accepted: Orders, SmartSet

## 2016-11-11 ENCOUNTER — Encounter (HOSPITAL_COMMUNITY)
Admission: RE | Admit: 2016-11-11 | Discharge: 2016-11-11 | Disposition: A | Discharge status: Home / Self Care | Payer: Medicare HMO | Source: Ambulatory Visit | Attending: Dentistry | Admitting: Dentistry

## 2016-11-11 ENCOUNTER — Telehealth: Payer: Self-pay | Admitting: Cardiovascular Disease

## 2016-11-11 ENCOUNTER — Encounter: Payer: Self-pay | Admitting: Nurse Practitioner

## 2016-11-11 ENCOUNTER — Encounter (HOSPITAL_COMMUNITY): Payer: Self-pay

## 2016-11-11 ENCOUNTER — Ambulatory Visit (INDEPENDENT_AMBULATORY_CARE_PROVIDER_SITE_OTHER): Payer: Medicare HMO | Admitting: Nurse Practitioner

## 2016-11-11 VITALS — BP 148/84 | HR 83 | Ht 66.0 in | Wt 227.0 lb

## 2016-11-11 DIAGNOSIS — I739 Peripheral vascular disease, unspecified: Secondary | ICD-10-CM

## 2016-11-11 DIAGNOSIS — F4310 Post-traumatic stress disorder, unspecified: Secondary | ICD-10-CM | POA: Diagnosis not present

## 2016-11-11 DIAGNOSIS — I110 Hypertensive heart disease with heart failure: Secondary | ICD-10-CM | POA: Diagnosis not present

## 2016-11-11 DIAGNOSIS — I481 Persistent atrial fibrillation: Secondary | ICD-10-CM | POA: Diagnosis not present

## 2016-11-11 DIAGNOSIS — F419 Anxiety disorder, unspecified: Secondary | ICD-10-CM | POA: Diagnosis not present

## 2016-11-11 DIAGNOSIS — I5033 Acute on chronic diastolic (congestive) heart failure: Secondary | ICD-10-CM | POA: Diagnosis not present

## 2016-11-11 DIAGNOSIS — Z87891 Personal history of nicotine dependence: Secondary | ICD-10-CM | POA: Diagnosis not present

## 2016-11-11 DIAGNOSIS — Z79899 Other long term (current) drug therapy: Secondary | ICD-10-CM | POA: Diagnosis not present

## 2016-11-11 DIAGNOSIS — M270 Developmental disorders of jaws: Secondary | ICD-10-CM | POA: Diagnosis not present

## 2016-11-11 DIAGNOSIS — I4819 Other persistent atrial fibrillation: Secondary | ICD-10-CM

## 2016-11-11 DIAGNOSIS — M1990 Unspecified osteoarthritis, unspecified site: Secondary | ICD-10-CM | POA: Diagnosis not present

## 2016-11-11 DIAGNOSIS — E782 Mixed hyperlipidemia: Secondary | ICD-10-CM

## 2016-11-11 DIAGNOSIS — G4733 Obstructive sleep apnea (adult) (pediatric): Secondary | ICD-10-CM | POA: Diagnosis not present

## 2016-11-11 DIAGNOSIS — E785 Hyperlipidemia, unspecified: Secondary | ICD-10-CM | POA: Diagnosis not present

## 2016-11-11 DIAGNOSIS — Z888 Allergy status to other drugs, medicaments and biological substances status: Secondary | ICD-10-CM | POA: Diagnosis not present

## 2016-11-11 DIAGNOSIS — Z7901 Long term (current) use of anticoagulants: Secondary | ICD-10-CM | POA: Diagnosis not present

## 2016-11-11 DIAGNOSIS — I10 Essential (primary) hypertension: Secondary | ICD-10-CM

## 2016-11-11 DIAGNOSIS — K0530 Chronic periodontitis, unspecified: Secondary | ICD-10-CM | POA: Diagnosis not present

## 2016-11-11 DIAGNOSIS — I340 Nonrheumatic mitral (valve) insufficiency: Secondary | ICD-10-CM

## 2016-11-11 DIAGNOSIS — K0889 Other specified disorders of teeth and supporting structures: Secondary | ICD-10-CM | POA: Diagnosis not present

## 2016-11-11 DIAGNOSIS — I071 Rheumatic tricuspid insufficiency: Secondary | ICD-10-CM | POA: Diagnosis not present

## 2016-11-11 DIAGNOSIS — Z9889 Other specified postprocedural states: Secondary | ICD-10-CM | POA: Diagnosis not present

## 2016-11-11 DIAGNOSIS — Z90710 Acquired absence of both cervix and uterus: Secondary | ICD-10-CM | POA: Diagnosis not present

## 2016-11-11 DIAGNOSIS — B009 Herpesviral infection, unspecified: Secondary | ICD-10-CM | POA: Diagnosis not present

## 2016-11-11 HISTORY — DX: Gastro-esophageal reflux disease without esophagitis: K21.9

## 2016-11-11 LAB — CBC
HCT: 42.1 % (ref 36.0–46.0)
Hemoglobin: 13.8 g/dL (ref 12.0–15.0)
MCH: 29.1 pg (ref 26.0–34.0)
MCHC: 32.8 g/dL (ref 30.0–36.0)
MCV: 88.6 fL (ref 78.0–100.0)
Platelets: 243 10*3/uL (ref 150–400)
RBC: 4.75 MIL/uL (ref 3.87–5.11)
RDW: 13.8 % (ref 11.5–15.5)
WBC: 7.6 10*3/uL (ref 4.0–10.5)

## 2016-11-11 LAB — BASIC METABOLIC PANEL
Anion gap: 7 (ref 5–15)
BUN: 17 mg/dL (ref 6–20)
CALCIUM: 9.4 mg/dL (ref 8.9–10.3)
CO2: 28 mmol/L (ref 22–32)
Chloride: 104 mmol/L (ref 101–111)
Creatinine, Ser: 0.89 mg/dL (ref 0.44–1.00)
GFR calc Af Amer: >60 mL/min (ref >60)
GLUCOSE: 108 mg/dL — AB (ref 65–99)
Potassium: 4.5 mmol/L (ref 3.5–5.1)
Sodium: 139 mmol/L (ref 135–145)

## 2016-11-11 MED ORDER — FUROSEMIDE 40 MG PO TABS: 40.0000 mg | ORAL_TABLET | Freq: Every day | ORAL | 1 refills | Status: DC

## 2016-11-11 MED ORDER — FUROSEMIDE 40 MG PO TABS: 40.0000 mg | ORAL_TABLET | Freq: Every day | ORAL | 3 refills | Status: DC

## 2016-11-11 MED ORDER — FUROSEMIDE 40 MG PO TABS
40.0000 mg | ORAL_TABLET | Freq: Every day | ORAL | 11 refills | Status: DC
Start: 2016-11-11 — End: 2016-11-11

## 2016-11-11 NOTE — Progress Notes (Signed)
Pt denies any acute cardiopulmonary issues. Pt stated that she is under the care of Dr. Rayann Heman and Dr. Fletcher Anon, Cardiology. Pt denies having a chest x ray within the last year. Pt denies having recent labs. Stat PT INR needed on DOS. Pt last dose of Eliquis was Monday. Anesthesia made aware of order for consult.

## 2016-11-11 NOTE — Patient Instructions (Signed)
Kayla Bayley, NP has recommended making the following medication changes: 1. START Furosemide 40 mg - take 1 tablet by mouth daily  Your physician recommends that you return for lab work in 1 week.   Please keep your upcoming appointment with Dr Fletcher Anon.

## 2016-11-11 NOTE — Progress Notes (Signed)
Anesthesia PAT Evaluation: Consult requested re: assess abilitly to proceed with general anesthesia with nasal endotracheal tube.  Patient is a 56 year old female scheduled for multiple teeth extractions with alveoloplasty, pre-prosthetic surgery as needed on 11/12/16 by Dr. Enrique Sack. Patient has moderate MR and severe TR with afib and diastolic CHF. She has failed DCCV and flecainide therapy. EP cardiology was evaluating patient for ablation, but based on TEE results she was felt also low risk for success with ablation and was referred to CT surgery for consideration of MAZE with LAA amputation +/- valvular repair. She has seen CT surgeon Dr. Roxy Manns who discussed options of aggressive medical therapy versus mitral and tricuspid valve repairs with MAZE procedure. She is interested in a more aggressive approach and will discuss further with her cardiologist. In the interim, Dr. Roxy Manns referred patient to the dental clinic for evaluation and treatment of periodontal disease.   History includes afib (s/p failed DCCV), severe TR and moderate MR, chronic diastolic CHF, SOB, HTN, moderate OSA 04/07/16 (started on CPAP), PAD s/p bilateral CIA stents '10 and s/p right CIA angioplasty (for in-stent stenosis)'13, PTSD, anxiety, depression, former smoker (quit 11/10/15), hysterectomy, migraines. She had moderate pulmonary hypertension by 02/2016 echo, and has since had a sleep study and was started on CPAP. BMI is consistent with obesity.  PCP is Dr. Pricilla Holm. Primary cardiologist is Dr. Kathlyn Sacramento. Next visit 11/24/16. EP cardiologist is Dr. Thompson Grayer.  Meds include Eliquis (last dose 11/09/16 AM), Lipitor, diltiazem, Nexium, fish oil, Toprol XL.   BP 134/78   Pulse 60   Temp 36.6 C   Resp 18   Ht 5\' 6"  (1.676 m)   Wt 226 lb 8 oz (102.7 kg)   LMP 11/09/2008   SpO2 98%   BMI 36.56 kg/m  Exam shows a pleasant Caucasian female in NAD. She reports chronic SOB. No conversational dyspnea noted. She  reports bilateral ankle/pedal edema, particularly at the end of the day. She is able to lie flat if she is using her CPAP. She has had a 40 pound weight gain over the past year. She reports right elbow stiffness over the past few months, but has not noted any signs of infection. She gets occasional palpitations. Heart sounds are irregularly irregular and somewhat distant. I did not appreciate a murmur. Mild pretibial edema. She denied history of nasal or skull fractures, epistaxis.    EKG 09/09/16: Afib at 81 bpm.  TEE 10/13/16: Study Conclusions - Left ventricle: Systolic function was normal. The estimated   ejection fraction was in the range of 55% to 60%. Wall motion was   normal; there were no regional wall motion abnormalities. - Aortic valve: No evidence of vegetation. - Mitral valve: No evidence of vegetation. There was moderate   regurgitation. - Left atrium: The atrium was moderately dilated. No evidence of   thrombus in the atrial cavity or appendage. There was moderate   spontaneous echo contrast (&quot;smoke&quot;) in the cavity and the   appendage. - Right atrium: The atrium was mildly dilated. - Atrial septum: No defect or patent foramen ovale was identified. - Tricuspid valve: No evidence of vegetation. There was severe   regurgitation. - Pulmonic valve: No evidence of vegetation. - Pericardium, extracardiac: A trivial pericardial effusion was   identified.  Echo (TTE) 02/11/16: Study Conclusions - Left ventricle: The cavity size was normal. Wall thickness was   normal. Systolic function was normal. The estimated ejection   fraction was in the range of  55% to 60%. Wall motion was normal;   there were no regional wall motion abnormalities. - Mitral valve: There was mild to moderate regurgitation. - Left atrium: The atrium was moderately dilated. - Pulmonary arteries: Systolic pressure was moderately increased.   PA peak pressure: 51 mm Hg (S). - Pericardium, extracardiac:  A trivial pericardial effusion was   identified. Impressions: - Normal LV systolic function; moderate LAE; mild to moderate MR;   mild TR; moderately elevated pulmonary pressure.  Nuclear stress test 02/12/16: IMPRESSION: 1. No reversible ischemia or infarction. 2. Mild septal hypokinesis. 3. Left ventricular ejection fraction 60% 4. Low-risk stress test findings*.  48 Hour Holter monitor 02/2016: Atrial fibrillation with episodes of rapid ventricular response. Mean heart rate was 103 bpm.  Cardiac cath 08/13/08:  IMPRESSION: 1. Normal-appearing coronary arteries. 2. Preserved left ventricular systolic function, ejection fraction 65%, left ventricular diastolic pressure is 17 mmHg.  CXR 02/10/16: IMPRESSION: No active cardiopulmonary disease.  PFTs 02/22/15: FVC 3.11 (82%), FEV1 2.44 (82%), DLCOunc 24.01 (89%).  Preoperative labs CBC and BMET WNL. A1c 5.8 on 11/05/16. PT/INR was not drawn with labs, so will need to be done on the day of surgery.    She wants to proceed with cardiac surgical intervention in the near future. Needs dental surgery first. She is seeing Dr. Fletcher Anon in ~ 2 weeks. Dr. Roxy Manns inquired whether she will need to be started on diuretic therapy eventually. She is to discuss this with Dr. Fletcher Anon, but I also encouraged her to call sooner if any worsening SOB or sudden weight changes. Lungs sounds are clear today. Sats 98%. She reports plans for discharge home following dental surgery. She does have someone to stay with her. She has CPAP at home. We discussed that nasal intubation may be utilized for this procedure. Her anesthesiologist will determine the definitive plan on the day of surgery.   George Hugh [facility] Short Stay Center/Anesthesiology Phone [phone] 11/11/2016 11:48 AM

## 2016-11-11 NOTE — Telephone Encounter (Signed)
New message      Pt was due to have an ablation this am.  She was not able to have it because she has a "leaky valve".  Patient is calling to see if Dr Fletcher Anon will call her in a fluid pill.  Please call

## 2016-11-11 NOTE — Progress Notes (Signed)
Office Visit    Patient Name: Kayla Hopkins Date of Encounter: 11/11/2016  Primary Care Provider:  Hoyt Koch, MD Primary Cardiologist:  Jerilynn Mages. Fletcher Anon, MD / J. Allred, MD (EP)  Chief Complaint    56 year old female with a history of peripheral vascular disease, persistent atrial fibrillation, mitral and tricuspid valve disease, hypertension, hyperlipidemia, and chronic diastolic CHF, who presents secondary to progressive dyspnea and weight gain.  Past Medical History    Past Medical History:  Diagnosis Date  . Anxiety   . Arthritis    "knees, back" (02/11/2016)  . Chronic diastolic CHF (congestive heart failure) ([place])    a. 02/2016 Echo; EF 55-60%, no rwma, mild to mod MR, mod dil LA, PASP 23mmHg;  b. 10/2016 TEE: nl EF, mod MR, mod-sev TR.  . Claudication ([place])       . Daily headache   . Depression   . GERD (gastroesophageal reflux disease)   . Herpes    no outbreaks-has Valtrex to take PRN  . Hyperlipidemia   . Hypertension   . Migraine    "probably have 3/year; had one today; tried several medications-no relief" (02/11/2016)  . Moderate mitral regurgitation    a. 10/2016 TEE: nl EF, mod MR, mod to severe LAE, mod to sev TR.  . Moderate to Severe Tricuspid regurgitation    a. 10/2014 TEE: mod-sev TR.  [name] Obstructive sleep apnea    wears CPAP  . Peripheral vascular disease ([place]) 06/11/2009   a. 06/2009 Bilateral common iliac kissing stents (8X24 mm);  b. 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 She underwent successful balloon angioplasty;  c. Restenosis in RCIA in 12/13 s/p  Covered stent; d. 05/2013: 70% in distal left common iliac artery s/p self expanding stent placement; e. 08/2016 ABI: R 1.1, L 0.99, nl Abd Ao.  [name] Persistent atrial fibrillation ([facility])    a. s/p prior DCCV x 2;  b. failed medical therapy-->poor candidate for PVI 2/2 mod MR and LAE.  [name] PTSD (post-traumatic stress disorder)   .  Shortness of breath dyspnea    patient states she has had lung test that came back normal  . Tobacco abuse    stopped smoking, 2016  . Wears dentures   . Wears glasses    Past Surgical History:  Procedure Laterality Date  . ABDOMINAL ANGIOGRAM N/A 05/11/2012   Procedure: ABDOMINAL ANGIOGRAM;  Surgeon: [name], MD;  Location: [facility] CATH LAB;  Service: Cardiovascular;  Laterality: N/A;  . ABDOMINAL AORTAGRAM N/A 10/19/2012   Procedure: ABDOMINAL Maxcine Ham;  Surgeon: [name], MD;  Location: [facility] CATH LAB;  Service: Cardiovascular;  Laterality: N/A;  . ABDOMINAL AORTAGRAM N/A 05/10/2013   Procedure: ABDOMINAL Maxcine Ham;  Surgeon: [name], MD;  Location: [facility] CATH LAB;  Service: Cardiovascular;  Laterality: N/A;  . BACK SURGERY    . CARDIAC CATHETERIZATION  2009   no significant obstruction  . CARDIOVERSION N/A 03/19/2016   Procedure: CARDIOVERSION;  Surgeon: Pixie Casino, MD;  Location: [facility] ENDOSCOPY;  Service: Cardiovascular;  Laterality: N/A;  . CARDIOVERSION N/A 07/10/2016   Procedure: CARDIOVERSION;  Surgeon: Pixie Casino, MD;  Location: [facility] ENDOSCOPY;  Service: Cardiovascular;  Laterality: N/A;  . ILIAC ARTERY STENT Bilateral 06/2009   common iliac kissing stents (8X24 mm)  . KNEE ARTHROSCOPY Bilateral   . LUMBAR DISC SURGERY     spinal stenosis  . ROBOTIC ASSISTED TOTAL HYSTERECTOMY WITH  SALPINGECTOMY Bilateral 09/23/2015   Procedure: ROBOTIC ASSISTED TOTAL HYSTERECTOMY BILATERALSALPINGECTOMY AND OOPHORECTOMY;  Surgeon: [name], MD;  Location: [facility];  Service: Gynecology;  Laterality: Bilateral;  . TEE WITHOUT CARDIOVERSION N/A 10/13/2016   Procedure: TRANSESOPHAGEAL ECHOCARDIOGRAM (TEE);  Surgeon: Lelon Perla, MD;  Location: [facility];  Service: Cardiovascular;  Laterality: N/A;  . [place]  . 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 Pradaxa [Dabigatran Etexilate Mesylate] Other (See Comments)    Gastritis   . Zocor [Simvastatin] Other (See Comments)    MYALGIAS LEG PAINS     History of Present Illness    56 year old female with the above complex past medical history including diastolic CHF, progressive tricuspid and mitral valvular disease, persistent symptomatically treat fibrillation, hypertension, hyperlipidemia, and peripheral vascular disease status post multiple interventions. She was recently evaluated by electrophysiology for persistent, symptomatically atrial fibrillation despite drug therapy. Arrangements were made for pulmonary vein isolation and she underwent transesophageal echocardiogram on December 5 showed moderate mitral regurgitation and moderate to severe tricuspid regurgitation. The left atrium was severely dilated. She was not felt to be a good candidate for catheter ablation of atrial fibrillation and thoracic surgery was consulted given valvular disease. It was felt that she would be a candidate for mitral and tricuspid valve repair with left atrial appendage ligation and surgical maze and was also recommended that consideration be given to adding diuretic therapy given diastolic failure and ongoing symptoms. She also has multiple dental issues and will require multiple dental extractions. She has been seen by dentistry and is scheduled for extractions tomorrow.   Unfortunately, over the past 4 months she has noted progressive dyspnea on exertion and also orthopnea and bendopnea.  Her weight has steadily climbed and is currently up 7 pounds since December 5. She has significant abdominal bloating with increased girth and notes dependent edema generally later in the day. Because of progressive dyspnea, she contacted our office this morning and was added onto my schedule today.   Home Medications    Prior to Admission  medications   Medication Sig Start Date End Date Taking? Authorizing Provider  acetaminophen (TYLENOL) 500 MG chewable tablet Chew 1,000 mg by mouth every 6 (six) hours as needed for pain.   Yes Historical Provider, MD  apixaban (ELIQUIS) 5 MG TABS tablet Take 1 tablet (5 mg total) by mouth 2 (two) times daily. 09/17/16  Yes Thompson Grayer, MD  atorvastatin (LIPITOR) 40 MG tablet Take 1 tablet (40 mg total) by mouth daily. Patient taking differently: Take 40 mg by mouth daily at 6 PM.  08/28/16  Yes Rhonda G Barrett, PA-C  diltiazem (CARDIZEM CD) 180 MG 24 hr capsule Take 1 capsule (180 mg total) by mouth daily. 08/28/16  Yes Rhonda G Barrett, PA-C  esomeprazole (NEXIUM) 20 MG capsule Take 20 mg by mouth at bedtime.    Yes Historical Provider, MD  fish oil-omega-3 fatty acids 1000 MG capsule Take 1 g by mouth at bedtime.    Yes Historical Provider, MD  metoprolol succinate (TOPROL XL) 50 MG 24 hr tablet Take 1-2 tablets (50-100 mg total) by mouth daily. Okay to take additional 0.5-1 tablet daily as needed. Patient taking differently: Take 50 mg by mouth See admin instructions. Okay to take additional 0.5-1 tablet  daily as needed for heart racing. 09/09/16  Yes Thompson Grayer, MD    Review of Systems    As above, she has a 4 month history of progressive dyspnea exertion which has worsened recently. She does have chronic orthopnea which is improved when using cpap.   She has had weight gain and is up 35 pounds over the past year. She has mild lower extremity edema which is worse at the end of the day. She denies PND, dizziness, syncope, or early satiety. No chest pain or palpitations. All other systems reviewed and are otherwise negative except as noted above.  Physical Exam    VS:  BP (!) 148/84   Pulse 83   Ht 5\' 6"  (1.676 m)   Wt 227 lb (103 kg)   LMP 11/09/2008   BMI 36.64 kg/m  , BMI Body mass index is 36.64 kg/m. GEN: Well nourished, well developed, in no acute distress.  HEENT: normal.    Neck: Supple,JVP approximately 12 cm, no  carotid bruits, or masses. Cardiac:Irregularly irregular with a 2/6 systolic murmur loudest at the left lower sternal border and heard at the apex, no  rubs, or gallops. No clubbing, cyanosis, edema.  Radials/DP/PT 2+ and equal bilaterally.  Respiratory:  Respirations regular and unlabored, clear to auscultation bilaterally. GI: round, semi-firm, protuberant, BS + x 4. MS: no deformity or atrophy Skin: warm and dry, no rash. Neuro:  Strength and sensation are intact. Psych: Normal affect.  Accessory Clinical Findings    ECG - afib, 84, no acute st/t changes.  Lab Results  Component Value Date   CREATININE 0.89 11/11/2016   BUN 17 11/11/2016   NA 139 11/11/2016   K 4.5 11/11/2016   CL 104 11/11/2016   CO2 28 11/11/2016     Assessment & Plan    1.  Acute on chronic diastolic congestive heart failure: Patient presents today with a 4 month history of progressive dyspnea on exertion, increasing abdominal girth, lower extremity swelling, and orthopnea. Symptoms have worsened significantly in the past 2 weeks. She is up 7 pounds since her hospital visit on December 5 and is up 35 pounds over the past year. On exam, she does have JVD. Her abdomen is somewhat firm and protuberant. She does not have any significant lower extremity edema. I suspect that she has diastolic failure in the setting of ongoing A. fib and loss of atrial kick. I will add Lasix 40 mg daily. She had labs this morning and creatinine and potassium were within normal limits. I will arrange for a follow-up basic metabolic panel in 1 week. She has follow-up with Dr. Fletcher Anon on the 16th.  2. Persistent atrial fibrillation: As outlined above, she has ongoing A. fib and has failed prior cardioversions and medical therapy. She remains on diltiazem and metoprolol, and is well rate controlled. She is usually on eliquis however this is on hold in preparation for multiple tooth extractions  tomorrow. CHA2DS2VASc equals 4. She has been evaluated by electrophysiology and is not felt to be a good candidate for catheter ablation secondary to moderate mitral regurgitation and severely dilated left atrium. She is being considered for surgical maze and left atrial appendage ligation in the setting of valvular disease and surgery.    3. Moderate MR/Mod-Sev TR:  Seen by Dr. Roxy Manns with consideration being give to surgical valve intervention.  She meets with Dr. Fletcher Anon in 2 wks to discuss further.  She will need R & L Heart cath prior to any  surgery.  4.  Essential hypertension: Blood pressure was elevated today in the setting of volume overload. Adding Lasix as above. Otherwise continue beta blocker and calcium channel blocker.  5. Hyperlipidemia: Continue statin therapy. Next  6. Peripheral vascular disease: Recent normal ABIs in October 2017 without significant aortic disease on duplex. Continue statin therapy.  7.  Dispo:  F/u BMET in 1 wk given addition of lasix.  F/u with Dr. Fletcher Anon in 2 wks as planned.  F/u with TCTS @ the end of the month.     Murray Hodgkins, NP 11/11/2016, 2:06 PM

## 2016-11-11 NOTE — Telephone Encounter (Signed)
Returned call to patient. She has "major" shortness of breath. Feet and hands are swelling badly. She states this has been going on for a while. She has gained 30lbs in the 6 months. She is having pain from the swelling - needles in feet and hands.   She was supposed to have an AF ablation this AM. She is going to have to have surgery done before this.   She is not currently on a diuretic. Advised it would be best for her to be seen in the office. She will come in @ 130pm to see Angelica Ran, NP 1/3

## 2016-11-11 NOTE — Pre-Procedure Instructions (Signed)
Kayla Hopkins  11/11/2016      [place], [place] [address] Phone: [phone] Fax: [facility] [place], [place] [address] Phone: [phone] Fax: [phone]    Your procedure is scheduled on Thursday, November 12, 2016  Report to [facility] Admitting at 5:30 A.M.  Call this number if you have problems the morning of surgery:  [phone]   Remember:  Do not eat food or drink liquids after midnight.  Take these medicines the morning of surgery with A SIP OF WATER : diltiazem (CARDIZEM CD), metoprolol succinate (TOPROL XL),  if needed: acetaminophen (TYLENOL) Stop taking Aspirin, vitamins, fish oil and herbal medications. Do not take any NSAIDs ie: Ibuprofen, Advil, Naproxen, BC and Goody Powder or any medication containing Aspirin; stop now.  Do not wear jewelry, make-up or nail polish.  Do not wear lotions, powders, or perfumes, or deoderant.  Do not shave 48 hours prior to surgery.    Do not bring valuables to the hospital.  [facility] is not responsible for any belongings or valuables.  Contacts, dentures or bridgework may not be worn into surgery.  Leave your suitcase in the car.  After surgery it may be brought to your room.  Patients discharged the day of surgery will not be allowed to drive home.  Special instructions:   [place] - Preparing for Surgery  Before surgery, you can play an important role.  Because skin is not sterile, your skin needs to be as free of germs as possible.  You can reduce the number of germs on you skin by washing with CHG (chlorahexidine gluconate) soap before surgery.  CHG is an antiseptic cleaner which kills germs and bonds with the skin to continue killing germs even after washing.  Please DO NOT use if you have an allergy to CHG or antibacterial soaps.   If your skin becomes reddened/irritated stop using the CHG and inform your nurse when you arrive at Short Stay.  Do not shave (including legs and underarms) for at least 48 hours prior to the first CHG shower.  You may shave your face.  Please follow these instructions carefully:   1.  Shower with CHG Soap the night before surgery and the morning of Surgery.  2.  If you choose to wash your hair, wash your hair first as usual with your normal shampoo.  3.  After you shampoo, rinse your hair and body thoroughly to remove the Shampoo.  4.  Use CHG as you would any other liquid soap.  You can apply chg directly  to the skin and wash gently with scrungie or a clean washcloth.  5.  Apply the CHG Soap to your body ONLY FROM THE NECK DOWN.  Do not use on open wounds or open sores.  Avoid contact with your eyes, ears, mouth and genitals (private parts).  Wash genitals (private parts) with your normal soap.  6.  Wash thoroughly, paying special attention to the area where your surgery will be performed.  7.  Thoroughly rinse your body with warm water from the neck down.  8.  DO NOT shower/wash with your normal soap after using and rinsing off the CHG Soap.  9.  Pat yourself dry with a clean towel.  10.  Wear clean pajamas.            11.  Place clean sheets on your bed the night of your first shower and do not sleep with pets.  Day of Surgery  Do not apply any lotions/deoderants the morning of surgery.  Please wear clean clothes to the hospital/surgery center.  Please read over the following fact sheets that you were given. Pain Booklet, Coughing and Deep Breathing and Surgical Site Infection Prevention

## 2016-11-12 ENCOUNTER — Ambulatory Visit (HOSPITAL_COMMUNITY): Payer: Medicare HMO | Admitting: Certified Registered Nurse Anesthetist

## 2016-11-12 ENCOUNTER — Encounter (HOSPITAL_COMMUNITY)
Admission: RE | Disposition: A | Discharge status: Home / Self Care | Payer: Self-pay | Source: Ambulatory Visit | Attending: Dentistry

## 2016-11-12 ENCOUNTER — Ambulatory Visit (HOSPITAL_COMMUNITY)
Admission: RE | Admit: 2016-11-12 | Discharge: 2016-11-12 | Disposition: A | Discharge status: Home / Self Care | Payer: Medicare HMO | Source: Ambulatory Visit | Attending: Dentistry | Admitting: Dentistry

## 2016-11-12 ENCOUNTER — Ambulatory Visit (HOSPITAL_COMMUNITY): Payer: Medicare HMO | Admitting: Vascular Surgery

## 2016-11-12 ENCOUNTER — Ambulatory Visit (HOSPITAL_COMMUNITY): Payer: Commercial Managed Care - HMO | Admitting: Nurse Practitioner

## 2016-11-12 ENCOUNTER — Encounter (HOSPITAL_COMMUNITY): Payer: Self-pay | Admitting: Certified Registered Nurse Anesthetist

## 2016-11-12 DIAGNOSIS — G4733 Obstructive sleep apnea (adult) (pediatric): Secondary | ICD-10-CM | POA: Insufficient documentation

## 2016-11-12 DIAGNOSIS — Z79899 Other long term (current) drug therapy: Secondary | ICD-10-CM | POA: Diagnosis not present

## 2016-11-12 DIAGNOSIS — K0530 Chronic periodontitis, unspecified: Secondary | ICD-10-CM

## 2016-11-12 DIAGNOSIS — F4310 Post-traumatic stress disorder, unspecified: Secondary | ICD-10-CM | POA: Insufficient documentation

## 2016-11-12 DIAGNOSIS — I071 Rheumatic tricuspid insufficiency: Secondary | ICD-10-CM | POA: Diagnosis not present

## 2016-11-12 DIAGNOSIS — K06010 Localized gingival recession, unspecified: Secondary | ICD-10-CM

## 2016-11-12 DIAGNOSIS — I739 Peripheral vascular disease, unspecified: Secondary | ICD-10-CM | POA: Insufficient documentation

## 2016-11-12 DIAGNOSIS — F419 Anxiety disorder, unspecified: Secondary | ICD-10-CM | POA: Insufficient documentation

## 2016-11-12 DIAGNOSIS — I340 Nonrheumatic mitral (valve) insufficiency: Secondary | ICD-10-CM

## 2016-11-12 DIAGNOSIS — M270 Developmental disorders of jaws: Secondary | ICD-10-CM | POA: Insufficient documentation

## 2016-11-12 DIAGNOSIS — M1990 Unspecified osteoarthritis, unspecified site: Secondary | ICD-10-CM | POA: Insufficient documentation

## 2016-11-12 DIAGNOSIS — Z87891 Personal history of nicotine dependence: Secondary | ICD-10-CM | POA: Diagnosis not present

## 2016-11-12 DIAGNOSIS — Z7901 Long term (current) use of anticoagulants: Secondary | ICD-10-CM | POA: Insufficient documentation

## 2016-11-12 DIAGNOSIS — Z90710 Acquired absence of both cervix and uterus: Secondary | ICD-10-CM | POA: Insufficient documentation

## 2016-11-12 DIAGNOSIS — I481 Persistent atrial fibrillation: Secondary | ICD-10-CM | POA: Diagnosis not present

## 2016-11-12 DIAGNOSIS — Z9889 Other specified postprocedural states: Secondary | ICD-10-CM | POA: Insufficient documentation

## 2016-11-12 DIAGNOSIS — E785 Hyperlipidemia, unspecified: Secondary | ICD-10-CM | POA: Insufficient documentation

## 2016-11-12 DIAGNOSIS — I5033 Acute on chronic diastolic (congestive) heart failure: Secondary | ICD-10-CM | POA: Insufficient documentation

## 2016-11-12 DIAGNOSIS — B009 Herpesviral infection, unspecified: Secondary | ICD-10-CM | POA: Insufficient documentation

## 2016-11-12 DIAGNOSIS — Z888 Allergy status to other drugs, medicaments and biological substances status: Secondary | ICD-10-CM | POA: Insufficient documentation

## 2016-11-12 DIAGNOSIS — I509 Heart failure, unspecified: Secondary | ICD-10-CM | POA: Diagnosis not present

## 2016-11-12 DIAGNOSIS — K0889 Other specified disorders of teeth and supporting structures: Secondary | ICD-10-CM | POA: Insufficient documentation

## 2016-11-12 DIAGNOSIS — I110 Hypertensive heart disease with heart failure: Secondary | ICD-10-CM | POA: Insufficient documentation

## 2016-11-12 HISTORY — PX: MULTIPLE EXTRACTIONS WITH ALVEOLOPLASTY: SHX5342

## 2016-11-12 LAB — PROTIME-INR
INR: 1.08
Prothrombin Time: 14.1 seconds (ref 11.4–15.2)

## 2016-11-12 SURGERY — MULTIPLE EXTRACTION WITH ALVEOLOPLASTY
Anesthesia: General

## 2016-11-12 MED ORDER — OXYCODONE HCL 5 MG PO TABS: 5.0000 mg | ORAL_TABLET | Freq: Once | ORAL | Status: DC | PRN

## 2016-11-12 MED ORDER — FENTANYL CITRATE (PF) 100 MCG/2ML IJ SOLN
INTRAMUSCULAR | Status: DC | PRN
  Administered 2016-11-12 (×4): 50 ug via INTRAVENOUS

## 2016-11-12 MED ORDER — LACTATED RINGERS IV SOLN
INTRAVENOUS | Status: DC | PRN
  Administered 2016-11-12: 07:00:00 via INTRAVENOUS

## 2016-11-12 MED ORDER — AMINOCAPROIC ACID SOLUTION 5% (50 MG/ML)
10.0000 mL | ORAL | Status: DC
  Filled 2016-11-12: qty 100

## 2016-11-12 MED ORDER — FENTANYL CITRATE (PF) 100 MCG/2ML IJ SOLN
INTRAMUSCULAR | Status: AC
  Filled 2016-11-12: qty 2

## 2016-11-12 MED ORDER — LIDOCAINE-EPINEPHRINE 2 %-1:100000 IJ SOLN
INTRAMUSCULAR | Status: DC | PRN
  Administered 2016-11-12: 5.4 mL via INTRADERMAL

## 2016-11-12 MED ORDER — CHLORHEXIDINE GLUCONATE 0.12 % MT SOLN: OROMUCOSAL | 99 refills | Status: DC

## 2016-11-12 MED ORDER — BUPIVACAINE-EPINEPHRINE (PF) 0.5% -1:200000 IJ SOLN
INTRAMUSCULAR | Status: AC
  Filled 2016-11-12: qty 3.6

## 2016-11-12 MED ORDER — OXYCODONE-ACETAMINOPHEN 5-325 MG PO TABS
1.0000 | ORAL_TABLET | ORAL | Status: DC | PRN
  Administered 2016-11-12: 2 via ORAL
  Filled 2016-11-12: qty 2

## 2016-11-12 MED ORDER — ACETAMINOPHEN 160 MG/5ML PO SOLN
325.0000 mg | ORAL | Status: DC | PRN
  Filled 2016-11-12: qty 20.3

## 2016-11-12 MED ORDER — FENTANYL CITRATE (PF) 100 MCG/2ML IJ SOLN
25.0000 ug | INTRAMUSCULAR | Status: DC | PRN
  Administered 2016-11-12: 50 ug via INTRAVENOUS

## 2016-11-12 MED ORDER — FENTANYL CITRATE (PF) 100 MCG/2ML IJ SOLN
25.0000 ug | INTRAMUSCULAR | Status: DC | PRN
  Administered 2016-11-12 (×3): 50 ug via INTRAVENOUS

## 2016-11-12 MED ORDER — ROCURONIUM BROMIDE 100 MG/10ML IV SOLN
INTRAVENOUS | Status: DC | PRN
  Administered 2016-11-12: 50 mg via INTRAVENOUS

## 2016-11-12 MED ORDER — MIDAZOLAM HCL 5 MG/5ML IJ SOLN
INTRAMUSCULAR | Status: DC | PRN
  Administered 2016-11-12: 1 mg via INTRAVENOUS

## 2016-11-12 MED ORDER — 0.9 % SODIUM CHLORIDE (POUR BTL) OPTIME
TOPICAL | Status: DC | PRN
  Administered 2016-11-12: 1000 mL

## 2016-11-12 MED ORDER — BUPIVACAINE-EPINEPHRINE 0.5% -1:200000 IJ SOLN
INTRAMUSCULAR | Status: DC | PRN
  Administered 2016-11-12: 3.6 mL

## 2016-11-12 MED ORDER — PROPOFOL 10 MG/ML IV BOLUS
INTRAVENOUS | Status: DC | PRN
  Administered 2016-11-12 (×2): 50 mg via INTRAVENOUS
  Administered 2016-11-12 (×2): 30 mg via INTRAVENOUS

## 2016-11-12 MED ORDER — ACETAMINOPHEN 325 MG PO TABS: 325.0000 mg | ORAL_TABLET | ORAL | Status: DC | PRN

## 2016-11-12 MED ORDER — ONDANSETRON HCL 4 MG/2ML IJ SOLN
INTRAMUSCULAR | Status: AC
  Filled 2016-11-12: qty 2

## 2016-11-12 MED ORDER — DEXAMETHASONE SODIUM PHOSPHATE 10 MG/ML IJ SOLN
INTRAMUSCULAR | Status: DC | PRN
  Administered 2016-11-12: 10 mg via INTRAVENOUS

## 2016-11-12 MED ORDER — DEXAMETHASONE SODIUM PHOSPHATE 10 MG/ML IJ SOLN
INTRAMUSCULAR | Status: AC
  Filled 2016-11-12: qty 1

## 2016-11-12 MED ORDER — ROCURONIUM BROMIDE 50 MG/5ML IV SOSY
PREFILLED_SYRINGE | INTRAVENOUS | Status: AC
  Filled 2016-11-12: qty 5

## 2016-11-12 MED ORDER — PROPOFOL 10 MG/ML IV BOLUS
INTRAVENOUS | Status: AC
  Filled 2016-11-12: qty 40

## 2016-11-12 MED ORDER — LIDOCAINE 2% (20 MG/ML) 5 ML SYRINGE
INTRAMUSCULAR | Status: AC
  Filled 2016-11-12: qty 5

## 2016-11-12 MED ORDER — LACTATED RINGERS IV SOLN: INTRAVENOUS | Status: DC

## 2016-11-12 MED ORDER — OXYCODONE-ACETAMINOPHEN 5-325 MG PO TABS: ORAL_TABLET | ORAL | 0 refills | Status: DC

## 2016-11-12 MED ORDER — OXYMETAZOLINE HCL 0.05 % NA SOLN
NASAL | Status: DC | PRN
  Administered 2016-11-12: 2 via NASAL

## 2016-11-12 MED ORDER — ONDANSETRON HCL 4 MG/2ML IJ SOLN
INTRAMUSCULAR | Status: DC | PRN
  Administered 2016-11-12: 4 mg via INTRAVENOUS

## 2016-11-12 MED ORDER — SUGAMMADEX SODIUM 200 MG/2ML IV SOLN
INTRAVENOUS | Status: DC | PRN
  Administered 2016-11-12: 150 mg via INTRAVENOUS

## 2016-11-12 MED ORDER — SUGAMMADEX SODIUM 200 MG/2ML IV SOLN
INTRAVENOUS | Status: AC
  Filled 2016-11-12: qty 2

## 2016-11-12 MED ORDER — LIDOCAINE HCL (CARDIAC) 20 MG/ML IV SOLN
INTRAVENOUS | Status: DC | PRN
  Administered 2016-11-12: 60 mg via INTRAVENOUS

## 2016-11-12 MED ORDER — FENTANYL CITRATE (PF) 100 MCG/2ML IJ SOLN
INTRAMUSCULAR | Status: AC
  Filled 2016-11-12: qty 4

## 2016-11-12 MED ORDER — CEFAZOLIN SODIUM-DEXTROSE 2-4 GM/100ML-% IV SOLN
2.0000 g | Freq: Once | INTRAVENOUS | Status: AC
Start: 2016-11-12 — End: 2016-11-12
  Administered 2016-11-12: 2 g via INTRAVENOUS
  Filled 2016-11-12: qty 100

## 2016-11-12 MED ORDER — LIDOCAINE-EPINEPHRINE 2 %-1:100000 IJ SOLN
INTRAMUSCULAR | Status: AC
  Filled 2016-11-12: qty 6.8

## 2016-11-12 MED ORDER — OXYCODONE-ACETAMINOPHEN 5-325 MG PO TABS
ORAL_TABLET | ORAL | Status: AC
  Filled 2016-11-12: qty 2

## 2016-11-12 MED ORDER — HEMOSTATIC AGENTS (NO CHARGE) OPTIME
TOPICAL | Status: DC | PRN
  Administered 2016-11-12: 1 via TOPICAL

## 2016-11-12 MED ORDER — MIDAZOLAM HCL 2 MG/2ML IJ SOLN
INTRAMUSCULAR | Status: AC
  Filled 2016-11-12: qty 2

## 2016-11-12 MED ORDER — OXYCODONE HCL 5 MG/5ML PO SOLN: 5.0000 mg | Freq: Once | ORAL | Status: DC | PRN

## 2016-11-12 SURGICAL SUPPLY — 36 items
ALCOHOL 70% 16 OZ (MISCELLANEOUS) ×2 IMPLANT
ATTRACTOMAT 16X20 MAGNETIC DRP (DRAPES) ×2 IMPLANT
BLADE SURG 15 STRL LF DISP TIS (BLADE) ×2 IMPLANT
BLADE SURG 15 STRL SS (BLADE) ×2
COVER SURGICAL LIGHT HANDLE (MISCELLANEOUS) ×2 IMPLANT
GAUZE PACKING FOLDED 2  STR (GAUZE/BANDAGES/DRESSINGS) ×1
GAUZE PACKING FOLDED 2 STR (GAUZE/BANDAGES/DRESSINGS) ×1 IMPLANT
GAUZE SPONGE 4X4 16PLY XRAY LF (GAUZE/BANDAGES/DRESSINGS) ×2 IMPLANT
GLOVE BIOGEL PI IND STRL 6 (GLOVE) ×1 IMPLANT
GLOVE BIOGEL PI INDICATOR 6 (GLOVE) ×1
GLOVE SURG ORTHO 8.0 STRL STRW (GLOVE) ×2 IMPLANT
GLOVE SURG SS PI 6.0 STRL IVOR (GLOVE) ×2 IMPLANT
GOWN STRL REUS W/ TWL LRG LVL3 (GOWN DISPOSABLE) ×1 IMPLANT
GOWN STRL REUS W/TWL 2XL LVL3 (GOWN DISPOSABLE) ×2 IMPLANT
GOWN STRL REUS W/TWL LRG LVL3 (GOWN DISPOSABLE) ×1
HEMOSTAT SURGICEL 2X14 (HEMOSTASIS) ×2 IMPLANT
KIT BASIN OR (CUSTOM PROCEDURE TRAY) ×2 IMPLANT
KIT ROOM TURNOVER OR (KITS) ×2 IMPLANT
MANIFOLD NEPTUNE WASTE (CANNULA) ×2 IMPLANT
NEEDLE BLUNT 16X1.5 OR ONLY (NEEDLE) ×2 IMPLANT
NS IRRIG 1000ML POUR BTL (IV SOLUTION) ×2 IMPLANT
PACK EENT II TURBAN DRAPE (CUSTOM PROCEDURE TRAY) ×2 IMPLANT
PAD ARMBOARD 7.5X6 YLW CONV (MISCELLANEOUS) ×2 IMPLANT
SPONGE GAUZE 4X4 12PLY STER LF (GAUZE/BANDAGES/DRESSINGS) ×2 IMPLANT
SPONGE SURGIFOAM ABS GEL 100 (HEMOSTASIS) IMPLANT
SPONGE SURGIFOAM ABS GEL 12-7 (HEMOSTASIS) IMPLANT
SPONGE SURGIFOAM ABS GEL SZ50 (HEMOSTASIS) IMPLANT
SUCTION FRAZIER HANDLE 10FR (MISCELLANEOUS) ×1
SUCTION TUBE FRAZIER 10FR DISP (MISCELLANEOUS) ×1 IMPLANT
SUT CHROMIC 3 0 PS 2 (SUTURE) ×4 IMPLANT
SUT CHROMIC 4 0 P 3 18 (SUTURE) IMPLANT
SYR 50ML SLIP (SYRINGE) ×2 IMPLANT
TOWEL OR 17X26 10 PK STRL BLUE (TOWEL DISPOSABLE) ×2 IMPLANT
TUBE CONNECTING 12X1/4 (SUCTIONS) ×2 IMPLANT
WATER TABLETS ICX (MISCELLANEOUS) ×2 IMPLANT
YANKAUER SUCT BULB TIP NO VENT (SUCTIONS) ×2 IMPLANT

## 2016-11-12 NOTE — Op Note (Signed)
OPERATIVE REPORT    Patient:            Kayla Hopkins Date of Birth:  14-Jun-1961 MRN:                [id number]   DATE OF PROCEDURE:  11/12/2016  PREOPERATIVE DIAGNOSES: 1. Mitral valve regurgitation 2. Tricuspid valve regurgitation 3. Pre-heart valve surgery dental protocol 4. Chronic periodontitis 5. Loose teeth 6. Bilateral mandibular lingual tori  POSTOPERATIVE DIAGNOSES: 1. Mitral valve regurgitation 2. Tricuspid valve regurgitation 3. Pre-heart valve surgery dental protocol 4. Chronic periodontitis 5. Loose teeth 6. Bilateral mandibular lingual tori  OPERATIONS: 1. Multiple extraction of tooth numbers 18,2-29, and 31 2. 2 Quadrants of alveoloplasty 3. Bilateral mandibular lingual tori reductions   SURGEON: Lenn Cal, DDS  ASSISTANT: Camie Patience, (dental assistant)  ANESTHESIA: General anesthesia via nasoendotracheal tube.  MEDICATIONS: 1. Ancef 2 g IV prior to invasive dental procedures. 2. Local anesthesia with a total utilization of 3 carpules each containing 34 mg of lidocaine with 0.017 mg of epinephrine as well as 2 carpules each containing 9 mg of bupivacaine with 0.009 mg of epinephrine.  SPECIMENS: There are 12 teeth that were discarded.  DRAINS: None  CULTURES: None  COMPLICATIONS: None   ESTIMATED BLOOD LOSS: 100 mLs.  INTRAVENOUS FLUIDS: 500 mLs of Lactated ringers solution.  INDICATIONS: The patient was recently diagnosed with mitral regurgitation and tricuspid valve regurgitation.  A medically necessary dental consultation was then requested to evaluate poor dentition rule out dental infection that may affect the patient's systemic health and anticipated heart valve surgery.  The patient was examined and treatment planned for extraction remaining teeth with alveoloplasty and pre-prosthetic surgery as needed in the operating room with general anesthesia.  This treatment plan was formulated to decrease the risks and complications  associated with dental infection from affecting the patient's systemic health and anticipated heart valve surgery.  OPERATIVE FINDINGS: Patient was examined operating room number 8.  The teeth were identified for extraction. The patient was noted be affected by chronic periodontitis, loose teeth, and presence of bilateral mandibular lingual tori.   DESCRIPTION OF PROCEDURE: Patient was brought to the main operating room number 8. Patient was then placed in the supine position on the operating table. General anesthesia was then induced per the anesthesia team. The patient was then prepped and draped in the usual manner for dental medicine procedure. A timeout was performed. The patient was identified and procedures were verified. A throat pack was placed at this time. The oral cavity was then thoroughly examined with the findings noted above. The patient was then ready for dental medicine procedure as follows:  Local anesthesia was then administered sequentially with a total utilization of 3 carpules each containing 34 mg of lidocaine with 0.017 mg of epinephrine as well as 2 carpules  each containing 9 mg bupivacaine with 0.009 mg of epinephrine.  At this point time, the mandibular quadrants were approached. The patient was given bilateral inferior alveolar nerve blocks and long buccal nerve blocks utilizing the bupivacaine with epinephrine. Further infiltration was then achieved utilizing the lidocaine with epinephrine. A 15 blade incision was then made from the distal of number 17 and extended to the distal of #32.  Surgical flaps were then carefully reflected. Remaining lower teeth were then subluxated with a series of straight elevators. Tooth numbers 18,20-29, and 31 were then removed utilizing a 151 forceps without complications. Alveoloplasty was then performed utilizing a rongeurs and bone file. Flaps  were then further reflected to expose the bilateral mandibular lingual tori. These were then  reduced utilizing a surgical handpiece and bur and copious amounts sterile water. Further alveoloplasty was then performed utilizing a rongeurs and bone file. The tissues were approximated and trimmed appropriately. The surgical sites were then irrigated with copious amounts of sterile saline 4.  A piece of Surgicel was placed in each extraction socket appropriately. The mandibular left surgical site was then closed from the distal of 17 and extended to the mesial of #24 utilizing 3-0 chromic gut suture in a continuous interrupted suture technique 1. The mandibular right surgical site was then closed from the distal of 32 and extended to the mesial of #25 utilizing 3-0 chromic gut suture in a continuous interrupted suture technique x1. 3 interrupted sutures then placed to further close the surgical sites as needed with 3-0 chromic gut material.  At this point time, the entire mouth was irrigated with copious amounts of sterile saline. The patient was examined for complications, seeing none, the dental medicine procedure was deemed to be complete. The throat pack was removed at this time. An oral airway was then placed at the request of the anesthesia team. A series of 4 x 4 gauze moistened with Amicar 5% oral rinse were placed in the mouth to aid hemostasis. The patient was then handed over to the anesthesia team for final disposition. After an appropriate amount of time, the patient was extubated and taken to the postanesthsia care unit in good condition. All counts were correct for the dental medicine procedure. Patient is to continue the Amicar 5% oral rinse postoperatively. Patient is to rinse with 10 mL's every hour for the next 10 hours in a swish and spit manner.  The patient is to restart her Eliquis therapy postoperatively tomorrow evening if there is no significant oral bleeding. Patient is start using chlorhexidine rinses tomorrow afternoon. Patient is rinse with 15 ML's after breakfast and at bedtime  in a swish and spit manner.   Lenn Cal, DDS.

## 2016-11-12 NOTE — Anesthesia Procedure Notes (Signed)
Procedure Name: Intubation Date/Time: 11/12/2016 7:43 AM Performed by: Candis Shine Pre-anesthesia Checklist: Patient identified, Emergency Drugs available, Suction available and Patient being monitored Patient Re-evaluated:Patient Re-evaluated prior to inductionOxygen Delivery Method: Circle System Utilized Preoxygenation: Pre-oxygenation with 100% oxygen Intubation Type: IV induction Ventilation: Mask ventilation without difficulty and Oral airway inserted - appropriate to patient size Laryngoscope Size: Mac and 3 Grade View: Grade I Nasal Tubes: Nasal Rae, Nasal prep performed and Right Tube size: 7.0 mm Number of attempts: 1 Placement Confirmation: ETT inserted through vocal cords under direct vision,  positive ETCO2 and breath sounds checked- equal and bilateral Secured at: 24 cm Tube secured with: Tape Dental Injury: Teeth and Oropharynx as per pre-operative assessment

## 2016-11-12 NOTE — H&P (Signed)
11/12/2016  Patient:            Kayla Hopkins Date of Birth:  09-09-1961 MRN:                [id number]   BP (!) 146/83 (BP Location: Right Arm)   Pulse 80   Temp 97.5 F (36.4 C) (Oral)   Resp 19   Ht 5\' 6"  (1.676 m)   Wt 227 lb (103 kg)   LMP 11/09/2008   SpO2 96%   BMI 36.64 kg/m   Kayla Hopkins is a 56 yo female that presents for extraction remaining teeth with alveoloplasty and pre-prosthetic surgery as needed in the operating room with general anesthesia. Patient denies having any acute medical or dental changes. Please see note from Murray Hodgkins, NP to act as H and P for dental OR procedure.   Lenn Cal, DDS  Rogelia Mire, NP at 11/11/2016 1:30 PM   Status: Signed  Expand All Collapse All      Office Visit    Patient Name: Kayla Hopkins Date of Encounter: 11/11/2016  Primary Care Provider:  Hoyt Koch, MD Primary Cardiologist:  Jerilynn Mages. Fletcher Anon, MD / J. Allred, MD (EP)  Chief Complaint    56 year old female with a history of peripheral vascular disease, persistent atrial fibrillation, mitral and tricuspid valve disease, hypertension, hyperlipidemia, and chronic diastolic CHF, who presents secondary to progressive dyspnea and weight gain.  Past Medical History        Past Medical History:  Diagnosis Date  . Anxiety   . Arthritis    "knees, back" (02/11/2016)  . Chronic diastolic CHF (congestive heart failure) ([place])    a. 02/2016 Echo; EF 55-60%, no rwma, mild to mod MR, mod dil LA, PASP 7mmHg;  b. 10/2016 TEE: nl EF, mod MR, mod-sev TR.  . Claudication ([place])       . Daily headache   . Depression   . GERD (gastroesophageal reflux disease)   . Herpes    no outbreaks-has Valtrex to take PRN  . Hyperlipidemia   . Hypertension   . Migraine    "probably have 3/year; had one today; tried several medications-no relief" (02/11/2016)  . Moderate mitral regurgitation    a. 10/2016 TEE: nl EF, mod MR, mod to  severe LAE, mod to sev TR.  . Moderate to Severe Tricuspid regurgitation    a. 10/2014 TEE: mod-sev TR.  [name] Obstructive sleep apnea    wears CPAP  . Peripheral vascular disease ([place]) 06/11/2009   a. 06/2009 Bilateral common iliac kissing stents (8X24 mm);  b. 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 She underwent successful balloon angioplasty;  c. Restenosis in RCIA in 12/13 s/p  Covered stent; d. 05/2013: 70% in distal left common iliac artery s/p self expanding stent placement; e. 08/2016 ABI: R 1.1, L 0.99, nl Abd Ao.  [name] Persistent atrial fibrillation ([facility])    a. s/p prior DCCV x 2;  b. failed medical therapy-->poor candidate for PVI 2/2 mod MR and LAE.  [name] PTSD (post-traumatic stress disorder)   . Shortness of breath dyspnea    patient states she has had lung test that came back normal  . Tobacco abuse    stopped smoking, 2016  . Wears dentures   . Wears glasses         Past Surgical History:  Procedure Laterality Date  . ABDOMINAL ANGIOGRAM N/A 05/11/2012  Procedure: ABDOMINAL ANGIOGRAM;  Surgeon: [name], MD;  Location: [facility] CATH LAB;  Service: Cardiovascular;  Laterality: N/A;  . ABDOMINAL AORTAGRAM N/A 10/19/2012   Procedure: ABDOMINAL Maxcine Ham;  Surgeon: [name], MD;  Location: [place] CATH LAB;  Service: Cardiovascular;  Laterality: N/A;  . ABDOMINAL AORTAGRAM N/A 05/10/2013   Procedure: ABDOMINAL Maxcine Ham;  Surgeon: [name], MD;  Location: [facility] CATH LAB;  Service: Cardiovascular;  Laterality: N/A;  . BACK SURGERY    . CARDIAC CATHETERIZATION  2009   no significant obstruction  . CARDIOVERSION N/A 03/19/2016   Procedure: CARDIOVERSION;  Surgeon: Pixie Casino, MD;  Location: [facility] ENDOSCOPY;  Service: Cardiovascular;  Laterality: N/A;  . CARDIOVERSION N/A 07/10/2016   Procedure: CARDIOVERSION;  Surgeon: Pixie Casino, MD;  Location: [facility] ENDOSCOPY;  Service: Cardiovascular;   Laterality: N/A;  . ILIAC ARTERY STENT Bilateral 06/2009   common iliac kissing stents (8X24 mm)  . KNEE ARTHROSCOPY Bilateral   . LUMBAR DISC SURGERY     spinal stenosis  . ROBOTIC ASSISTED TOTAL HYSTERECTOMY WITH SALPINGECTOMY Bilateral 09/23/2015   Procedure: ROBOTIC ASSISTED TOTAL HYSTERECTOMY BILATERALSALPINGECTOMY AND OOPHORECTOMY;  Surgeon: [name], MD;  Location: [facility];  Service: Gynecology;  Laterality: Bilateral;  . TEE WITHOUT CARDIOVERSION N/A 10/13/2016   Procedure: TRANSESOPHAGEAL ECHOCARDIOGRAM (TEE);  Surgeon: Lelon Perla, MD;  Location: [place];  Service: Cardiovascular;  Laterality: N/A;  . [place]  . VULVECTOMY N/A 09/23/2015   Procedure: WIDE EXCISION VULVECTOMY  WLE of vulvar VIN 2;  Surgeon: [name], MD;  Location: [facility];  Service: Gynecology;  Laterality: N/A;    Allergies       Allergies  Allergen Reactions  . Pradaxa [Dabigatran Etexilate Mesylate] Other (See Comments)    Gastritis  . Zocor [Simvastatin] Other (See Comments)    MYALGIAS LEG PAINS    History of Present Illness    56 year old female with the above complex past medical history including diastolic CHF, progressive tricuspid and mitral valvular disease, persistent symptomatically treat fibrillation, hypertension, hyperlipidemia, and peripheral vascular disease status post multiple interventions. She was recently evaluated by electrophysiology for persistent, symptomatically atrial fibrillation despite drug therapy. Arrangements were made for pulmonary vein isolation and she underwent transesophageal echocardiogram on December 5 showed moderate mitral regurgitation and moderate to severe tricuspid regurgitation. The left atrium was severely dilated. She was not felt to be a good candidate for catheter ablation of atrial fibrillation and thoracic surgery was consulted given valvular disease. It was felt that she would be a candidate for mitral and  tricuspid valve repair with left atrial appendage ligation and surgical maze and was also recommended that consideration be given to adding diuretic therapy given diastolic failure and ongoing symptoms. She also has multiple dental issues and will require multiple dental extractions. She has been seen by dentistry and is scheduled for extractions tomorrow.   Unfortunately, over the past 4 months she has noted progressive dyspnea on exertion and also orthopnea and bendopnea.  Her weight has steadily climbed and is currently up 7 pounds since December 5. She has significant abdominal bloating with increased girth and notes dependent edema generally later in the day. Because of progressive dyspnea, she contacted our office this morning and was added onto my schedule today.   Home Medications           Prior to Admission medications   Medication Sig Start Date End Date Taking? Authorizing Provider  acetaminophen (TYLENOL) 500 MG chewable  tablet Chew 1,000 mg by mouth every 6 (six) hours as needed for pain.   Yes Historical Provider, MD  apixaban (ELIQUIS) 5 MG TABS tablet Take 1 tablet (5 mg total) by mouth 2 (two) times daily. 09/17/16  Yes Thompson Grayer, MD  atorvastatin (LIPITOR) 40 MG tablet Take 1 tablet (40 mg total) by mouth daily. Patient taking differently: Take 40 mg by mouth daily at 6 PM.  08/28/16  Yes Rhonda G Barrett, PA-C  diltiazem (CARDIZEM CD) 180 MG 24 hr capsule Take 1 capsule (180 mg total) by mouth daily. 08/28/16  Yes Rhonda G Barrett, PA-C  esomeprazole (NEXIUM) 20 MG capsule Take 20 mg by mouth at bedtime.    Yes Historical Provider, MD  fish oil-omega-3 fatty acids 1000 MG capsule Take 1 g by mouth at bedtime.    Yes Historical Provider, MD  metoprolol succinate (TOPROL XL) 50 MG 24 hr tablet Take 1-2 tablets (50-100 mg total) by mouth daily. Okay to take additional 0.5-1 tablet daily as needed. Patient taking differently: Take 50 mg by mouth See admin instructions.  Okay to take additional 0.5-1 tablet daily as needed for heart racing. 09/09/16  Yes Thompson Grayer, MD    Review of Systems    As above, she has a 4 month history of progressive dyspnea exertion which has worsened recently. She does have chronic orthopnea which is improved when using cpap.   She has had weight gain and is up 35 pounds over the past year. She has mild lower extremity edema which is worse at the end of the day. She denies PND, dizziness, syncope, or early satiety. No chest pain or palpitations. All other systems reviewed and are otherwise negative except as noted above.  Physical Exam    VS:  BP (!) 148/84   Pulse 83   Ht 5\' 6"  (1.676 m)   Wt 227 lb (103 kg)   LMP 11/09/2008   BMI 36.64 kg/m  , BMI Body mass index is 36.64 kg/m. GEN: Well nourished, well developed, in no acute distress.  HEENT: normal.  Neck: Supple,JVP approximately 12 cm, no  carotid bruits, or masses. Cardiac:Irregularly irregular with a 2/6 systolic murmur loudest at the left lower sternal border and heard at the apex, no  rubs, or gallops. No clubbing, cyanosis, edema.  Radials/DP/PT 2+ and equal bilaterally.  Respiratory:  Respirations regular and unlabored, clear to auscultation bilaterally. GI: round, semi-firm, protuberant, BS + x 4. MS: no deformity or atrophy Skin: warm and dry, no rash. Neuro:  Strength and sensation are intact. Psych: Normal affect.  Accessory Clinical Findings    ECG - afib, 84, no acute st/t changes.  RecentLabs       Lab Results  Component Value Date   CREATININE 0.89 11/11/2016   BUN 17 11/11/2016   NA 139 11/11/2016   K 4.5 11/11/2016   CL 104 11/11/2016   CO2 28 11/11/2016       Assessment & Plan    1.  Acute on chronic diastolic congestive heart failure: Patient presents today with a 4 month history of progressive dyspnea on exertion, increasing abdominal girth, lower extremity swelling, and orthopnea. Symptoms have worsened  significantly in the past 2 weeks. She is up 7 pounds since her hospital visit on December 5 and is up 35 pounds over the past year. On exam, she does have JVD. Her abdomen is somewhat firm and protuberant. She does not have any significant lower extremity edema. I suspect that she  has diastolic failure in the setting of ongoing A. fib and loss of atrial kick. I will add Lasix 40 mg daily. She had labs this morning and creatinine and potassium were within normal limits. I will arrange for a follow-up basic metabolic panel in 1 week. She has follow-up with Dr. Fletcher Anon on the 16th.  2. Persistent atrial fibrillation: As outlined above, she has ongoing A. fib and has failed prior cardioversions and medical therapy. She remains on diltiazem and metoprolol, and is well rate controlled. She is usually on eliquis however this is on hold in preparation for multiple tooth extractions tomorrow. CHA2DS2VASc equals 4. She has been evaluated by electrophysiology and is not felt to be a good candidate for catheter ablation secondary to moderate mitral regurgitation and severely dilated left atrium. She is being considered for surgical maze and left atrial appendage ligation in the setting of valvular disease and surgery.    3. Moderate MR/Mod-Sev TR:  Seen by Dr. Roxy Manns with consideration being give to surgical valve intervention.  She meets with Dr. Fletcher Anon in 2 wks to discuss further.  She will need R & L Heart cath prior to any surgery.  4.  Essential hypertension: Blood pressure was elevated today in the setting of volume overload. Adding Lasix as above. Otherwise continue beta blocker and calcium channel blocker.  5. Hyperlipidemia: Continue statin therapy. Next  6. Peripheral vascular disease: Recent normal ABIs in October 2017 without significant aortic disease on duplex. Continue statin therapy.  7.  Dispo:  F/u BMET in 1 wk given addition of lasix.  F/u with Dr. Fletcher Anon in 2 wks as planned.  F/u with TCTS @ the  end of the month.     Murray Hodgkins, NP 11/11/2016, 2:06 PM

## 2016-11-12 NOTE — Anesthesia Preprocedure Evaluation (Signed)
Anesthesia Evaluation  Patient identified by MRN, date of birth, ID band Patient awake    Reviewed: Allergy & Precautions, NPO status , Patient's Chart, lab work & pertinent test results  History of Anesthesia Complications Negative for: history of anesthetic complications  Airway Mallampati: II  TM Distance: >3 FB Neck ROM: Full    Dental  (+) Poor Dentition   Pulmonary shortness of breath, sleep apnea ,    breath sounds clear to auscultation       Cardiovascular hypertension, + Peripheral Vascular Disease and +CHF  + Valvular Problems/Murmurs MR  Rhythm:Regular + Systolic murmurs MR/TR   Neuro/Psych  Headaches, PSYCHIATRIC DISORDERS Anxiety    GI/Hepatic Neg liver ROS, GERD  Medicated and Controlled,  Endo/Other  negative endocrine ROS  Renal/GU negative Renal ROS     Musculoskeletal  (+) Arthritis ,   Abdominal   Peds  Hematology negative hematology ROS (+)   Anesthesia Other Findings   Reproductive/Obstetrics                             Anesthesia Physical Anesthesia Plan  ASA: III  Anesthesia Plan: General   Post-op Pain Management:    Induction: Intravenous  Airway Management Planned: Nasal ETT  Additional Equipment: None  Intra-op Plan:   Post-operative Plan: Extubation in OR  Informed Consent: I have reviewed the patients History and Physical, chart, labs and discussed the procedure including the risks, benefits and alternatives for the proposed anesthesia with the patient or authorized representative who has indicated his/her understanding and acceptance.   Dental advisory given  Plan Discussed with: Surgeon and CRNA  Anesthesia Plan Comments:         Anesthesia Quick Evaluation

## 2016-11-12 NOTE — Transfer of Care (Signed)
Immediate Anesthesia Transfer of Care Note  Patient: Kayla Hopkins  Procedure(s) Performed: Procedure(s): EXTRACTION OF TOOTH #'S 18, 20-29, AND 31 WITH  ALVEOLOPLASTY AND BILATERAL MANDIBULAR TORI REDUCTIONS. (N/A)  Patient Location: PACU  Anesthesia Type:General  Level of Consciousness: awake, alert  and oriented  Airway & Oxygen Therapy: Patient Spontanous Breathing and Patient connected to face mask oxygen  Post-op Assessment: Report given to RN and Post -op Vital signs reviewed and stable  Post vital signs: Reviewed and stable  Last Vitals:  Vitals:   11/12/16 0610 11/12/16 0902  BP: (!) 146/83   Pulse: 80   Resp: 19   Temp: 36.4 C 36.2 C    Last Pain:  Vitals:   11/12/16 0610  TempSrc: Oral         Complications: No apparent anesthesia complications

## 2016-11-12 NOTE — Discharge Instructions (Signed)

## 2016-11-12 NOTE — Progress Notes (Signed)
PRE-OPERATIVE NOTE:  11/12/2016 Kayla Hopkins [id number]  VITALS: BP (!) 146/83 (BP Location: Right Arm)   Pulse 80   Temp 97.5 F (36.4 C) (Oral)   Resp 19   Ht 5\' 6"  (1.676 m)   Wt 227 lb (103 kg)   LMP 11/09/2008   SpO2 96%   BMI 36.64 kg/m   Lab Results  Component Value Date   WBC 7.6 11/11/2016   HGB 13.8 11/11/2016   HCT 42.1 11/11/2016   MCV 88.6 11/11/2016   PLT 243 11/11/2016   BMET    Component Value Date/Time   NA 139 11/11/2016 1016   K 4.5 11/11/2016 1016   CL 104 11/11/2016 1016   CO2 28 11/11/2016 1016   GLUCOSE 108 (H) 11/11/2016 1016   BUN 17 11/11/2016 1016   CREATININE 0.89 11/11/2016 1016   CREATININE 0.88 10/26/2016 1344   CALCIUM 9.4 11/11/2016 1016   GFRNONAA >60 11/11/2016 1016   GFRAA >60 11/11/2016 1016    Lab Results  Component Value Date   INR 1.11 03/13/2016   INR 1.0 05/02/2013   INR 1.0 05/04/2012   No results found for: PTT   Kayla Hopkins presents for Multiple dental extractions with alveoloplasty and pre-prosthetic surgery as needed in the operating room with general anesthesia.   SUBJECTIVE: The patient denies any acute medical or dental changes and agrees to proceed with treatment as planned.  EXAM: No sign of acute dental changes.  ASSESSMENT: Patient is affected by chronic periodontitis, tooth mobility, and the presence of bilateral mandibular lingual tori.  PLAN: Patient agrees to proceed with treatment as planned in the operating room as previously discussed and accepts the risks, benefits, and complications of the proposed treatment. Patient is aware of the risk for bleeding, bruising, swelling, infection, pain, nerve damage, sinus involvement, root tip fracture, mandible fracture, and the risks of complications associated with the anesthesia. Patient also is aware of the potential for other complications up to and including death due to overall cardiovascular and respiratory compromise.    Lenn Cal, DDS

## 2016-11-13 ENCOUNTER — Encounter (HOSPITAL_COMMUNITY): Payer: Self-pay | Admitting: Dentistry

## 2016-11-13 ENCOUNTER — Other Ambulatory Visit: Payer: Self-pay

## 2016-11-13 MED ORDER — FUROSEMIDE 40 MG PO TABS: 40.0000 mg | ORAL_TABLET | Freq: Every day | ORAL | 3 refills | Status: DC

## 2016-11-16 ENCOUNTER — Other Ambulatory Visit: Payer: Self-pay | Admitting: *Deleted

## 2016-11-18 ENCOUNTER — Encounter (HOSPITAL_COMMUNITY): Payer: Self-pay | Admitting: Dentistry

## 2016-11-18 NOTE — Anesthesia Postprocedure Evaluation (Addendum)
Anesthesia Post Note  Patient: Kayla Hopkins  Procedure(s) Performed: Procedure(s) (LRB): EXTRACTION OF TOOTH #'S 18, 20-29, AND 31 WITH  ALVEOLOPLASTY AND BILATERAL MANDIBULAR TORI REDUCTIONS. (N/A)  Patient location during evaluation: PACU Anesthesia Type: General Level of consciousness: awake Pain management: pain level controlled Vital Signs Assessment: post-procedure vital signs reviewed and stable Respiratory status: spontaneous breathing, respiratory function stable and patient connected to nasal cannula oxygen Cardiovascular status: blood pressure returned to baseline and stable Postop Assessment: no signs of nausea or vomiting Anesthetic complications: no       Last Vitals:  Vitals:   11/12/16 0950 11/12/16 1032  BP: 137/88   Pulse: 79   Resp: 14   Temp:  36.4 C    Last Pain:  Vitals:   11/12/16 1032  TempSrc:   PainSc: 4                  Marik Sedore

## 2016-11-23 ENCOUNTER — Ambulatory Visit (HOSPITAL_COMMUNITY): Payer: Self-pay | Admitting: Dentistry

## 2016-11-23 ENCOUNTER — Encounter (HOSPITAL_COMMUNITY): Payer: Self-pay | Admitting: Dentistry

## 2016-11-23 VITALS — BP 121/54 | HR 77 | Temp 97.5°F

## 2016-11-23 DIAGNOSIS — K08109 Complete loss of teeth, unspecified cause, unspecified class: Secondary | ICD-10-CM

## 2016-11-23 DIAGNOSIS — I340 Nonrheumatic mitral (valve) insufficiency: Secondary | ICD-10-CM

## 2016-11-23 DIAGNOSIS — K08199 Complete loss of teeth due to other specified cause, unspecified class: Secondary | ICD-10-CM

## 2016-11-23 DIAGNOSIS — K0820 Unspecified atrophy of edentulous alveolar ridge: Secondary | ICD-10-CM

## 2016-11-23 DIAGNOSIS — I071 Rheumatic tricuspid insufficiency: Secondary | ICD-10-CM

## 2016-11-23 DIAGNOSIS — Z01818 Encounter for other preprocedural examination: Secondary | ICD-10-CM

## 2016-11-23 NOTE — Patient Instructions (Signed)
PLAN: 1. Continue chlorhexidine gluconate rinses after breakfast and at bedtime as instructed. 2. Use salt water rinses every 2 hours in between the chlorhexidine rinses. 3. Advance diet as tolerated to soft mechanical diet. Use nutritional supplements with protein to aid nutrition. 4. Follow-up with a dentist of her choice for fabrication of upper lower complete dentures in 4-6 weeks. Patient will need to be cleared medically for these procedures after the valve surgery. 5. Patient is currently cleared for heart valve surgery.   Lenn Cal, DDS

## 2016-11-23 NOTE — Progress Notes (Signed)
POST OPERATIVE NOTE:  11/23/2016 Kayla Hopkins [id number]  VITALS: BP (!) 121/54 (BP Location: Left Arm)   Pulse 77   Temp 97.5 F (36.4 C) (Oral)   LMP 11/09/2008   LABS:  Lab Results  Component Value Date   WBC 7.6 11/11/2016   HGB 13.8 11/11/2016   HCT 42.1 11/11/2016   MCV 88.6 11/11/2016   PLT 243 11/11/2016   BMET    Component Value Date/Time   NA 139 11/11/2016 1016   K 4.5 11/11/2016 1016   CL 104 11/11/2016 1016   CO2 28 11/11/2016 1016   GLUCOSE 108 (H) 11/11/2016 1016   BUN 17 11/11/2016 1016   CREATININE 0.89 11/11/2016 1016   CREATININE 0.88 10/26/2016 1344   CALCIUM 9.4 11/11/2016 1016   GFRNONAA >60 11/11/2016 1016   GFRAA >60 11/11/2016 1016    Lab Results  Component Value Date   INR 1.08 11/12/2016   INR 1.11 03/13/2016   INR 1.0 05/02/2013   No results found for: PTT   Kayla Hopkins is status post extraction of remaining teeth with alveoloplasty and pre-prosthetic surgery as needed in the operating room on 11/12/2016.   SUBJECTIVE: Patient indicates that the extraction sites are still sore. Patient denies any active bleeding. Patient indicates that stitches still remain.   EXAM: There is no sign of infection, heme, or ooze. Generalized primary closure is noted. Sutures are loosely intact.  PROCEDURE: The patient was given a chlorhexidine gluconate rinse for 30 seconds. Sutures were then removed without complication. Patient tolerated the procedure well.  ASSESSMENT: Post operative course is consistent with dental procedures performed in the operating room.  PLAN: 1. Continue chlorhexidine gluconate rinses after breakfast and at bedtime as instructed. 2. Use salt water rinses every 2 hours in between the chlorhexidine rinses. 3. Advance diet as tolerated to soft mechanical diet. Use nutritional supplements with protein to aid nutrition. 4. Follow-up with a dentist of her choice for fabrication of upper lower complete dentures  in 4-6 weeks. Patient will need to be cleared medically for these procedures after the valve surgery. 5. Patient is currently cleared for heart valve surgery.   Lenn Cal, DDS

## 2016-11-24 ENCOUNTER — Ambulatory Visit (INDEPENDENT_AMBULATORY_CARE_PROVIDER_SITE_OTHER): Payer: Medicare HMO | Admitting: Cardiovascular Disease

## 2016-11-24 VITALS — BP 124/88 | HR 66 | Ht 66.0 in | Wt 225.8 lb

## 2016-11-24 DIAGNOSIS — I059 Rheumatic mitral valve disease, unspecified: Secondary | ICD-10-CM | POA: Diagnosis not present

## 2016-11-24 DIAGNOSIS — I10 Essential (primary) hypertension: Secondary | ICD-10-CM

## 2016-11-24 DIAGNOSIS — I4819 Other persistent atrial fibrillation: Secondary | ICD-10-CM

## 2016-11-24 DIAGNOSIS — E785 Hyperlipidemia, unspecified: Secondary | ICD-10-CM

## 2016-11-24 DIAGNOSIS — I481 Persistent atrial fibrillation: Secondary | ICD-10-CM

## 2016-11-24 DIAGNOSIS — I739 Peripheral vascular disease, unspecified: Secondary | ICD-10-CM

## 2016-11-24 LAB — BASIC METABOLIC PANEL
BUN: 16 mg/dL (ref 7–25)
CHLORIDE: 100 mmol/L (ref 98–110)
CO2: 30 mmol/L (ref 20–31)
CREATININE: 0.87 mg/dL (ref 0.50–1.05)
Calcium: 9.2 mg/dL (ref 8.6–10.4)
Glucose, Bld: 102 mg/dL — ABNORMAL HIGH (ref 65–99)
Potassium: 4.2 mmol/L (ref 3.5–5.3)
SODIUM: 139 mmol/L (ref 135–146)

## 2016-11-24 LAB — CBC
HCT: 40.5 % (ref 35.0–45.0)
HEMOGLOBIN: 13 g/dL (ref 11.7–15.5)
MCH: 28.5 pg (ref 27.0–33.0)
MCHC: 32.1 g/dL (ref 32.0–36.0)
MCV: 88.8 fL (ref 80.0–100.0)
MPV: 9.8 fL (ref 7.5–12.5)
Platelets: 327 10*3/uL (ref 140–400)
RBC: 4.56 MIL/uL (ref 3.80–5.10)
RDW: 14.6 % (ref 11.0–15.0)
WBC: 7.8 10*3/uL (ref 3.8–10.8)

## 2016-11-24 MED ORDER — DILTIAZEM HCL ER COATED BEADS 180 MG PO CP24: 180.0000 mg | ORAL_CAPSULE | Freq: Every day | ORAL | 3 refills | Status: DC

## 2016-11-24 NOTE — Progress Notes (Signed)
Cardiology Office Note   Date:  11/24/2016   ID:  KISHAWNA RAPA, DOB 1961-01-18, MRN [id number]  PCP:  Hoyt Koch, MD  Cardiologist:   Kathlyn Sacramento, MD   No chief complaint on file.     History of Present Illness: Kayla Hopkins is a 56 y.o. female who presents for a follow-up visit regarding persistent atrial fibrillation, mitral regurgitation, tricuspid regurgitation and peripheral arterial disease.  She has no known CAD per cath back in 2009. She does have known history of peripheral arterial disease, persistent atrial fibrillation, hypertension, sleep apnea and hyperlipidemia.  She is status post bilateral iliac stenting with covered stent on the right side for restenosis.  She had persistent atrial fibrillation recently and was started on anticoagulation. Cardioversion was attempted on May 11 but was not successful and restoring sinus rhythm. She was diagnosed with atrial fibrillation last year. A diversion was not successful in restoring sinus rhythm. She was referred to Dr. Rayann Hopkins for consideration of catheter ablation. However, a TEE was performed which showed severe tricuspid regurgitation, moderate mitral regurgitation and significant biatrial enlargement. She was seen by Dr. Roxy Hopkins who recommended mitral and tricuspid valve repairs and surgical maze. The patient underwent dental extraction recently without complications. She was seen for worsening dyspnea early this month and was started on small dose furosemide 40 mg once daily. She reports improvement in symptoms although she continues to be limited by significant exertional dyspnea and palpitations. She also reports chest pain especially at night. The chest pain is substernal and described as tightness feeling. She has been using her CPAP machine on a regular basis.  Past Medical History:  Diagnosis Date  . Anxiety   . Arthritis    "knees, back" (02/11/2016)  . Chronic diastolic CHF (congestive heart  failure) ([place])    a. 02/2016 Echo; EF 55-60%, no rwma, mild to mod MR, mod dil LA, PASP 69mmHg;  b. 10/2016 TEE: nl EF, mod MR, mod-sev TR.  . Claudication ([place])       . Daily headache   . Depression   . GERD (gastroesophageal reflux disease)   . Herpes    no outbreaks-has Valtrex to take PRN  . Hyperlipidemia   . Hypertension   . Migraine    "probably have 3/year; had one today; tried several medications-no relief" (02/11/2016)  . Moderate mitral regurgitation    a. 10/2016 TEE: nl EF, mod MR, mod to severe LAE, mod to sev TR.  . Moderate to Severe Tricuspid regurgitation    a. 10/2014 TEE: mod-sev TR.  Marland Hopkins Obstructive sleep apnea    wears CPAP  . Peripheral vascular disease ([place]) 06/11/2009   a. 06/2009 Bilateral common iliac kissing stents (8X24 mm);  b. 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 She underwent successful balloon angioplasty;  c. Restenosis in RCIA in 12/13 s/p  Covered stent; d. 05/2013: 70% in distal left common iliac artery s/p self expanding stent placement; e. 08/2016 ABI: R 1.1, L 0.99, nl Abd Ao.  Marland Hopkins Persistent atrial fibrillation ([facility])    a. s/p prior DCCV x 2;  b. failed medical therapy-->poor candidate for PVI 2/2 mod MR and LAE.  Marland Hopkins PTSD (post-traumatic stress disorder)   . Shortness of breath dyspnea    patient states she has had lung test that came back normal  . Tobacco abuse    stopped smoking, 2016  . Wears dentures   .  Wears glasses     Past Surgical History:  Procedure Laterality Date  . ABDOMINAL ANGIOGRAM N/A 05/11/2012   Procedure: ABDOMINAL ANGIOGRAM;  Surgeon: Kayla Hampshire, MD;  Location: [facility] CATH LAB;  Service: Cardiovascular;  Laterality: N/A;  . ABDOMINAL AORTAGRAM N/A 10/19/2012   Procedure: ABDOMINAL Maxcine Ham;  Surgeon: Kayla Hampshire, MD;  Location: [facility] CATH LAB;  Service: Cardiovascular;  Laterality: N/A;  . ABDOMINAL AORTAGRAM N/A 05/10/2013   Procedure: ABDOMINAL  Maxcine Ham;  Surgeon: Kayla Hampshire, MD;  Location: [facility] CATH LAB;  Service: Cardiovascular;  Laterality: N/A;  . BACK SURGERY    . CARDIAC CATHETERIZATION  2009   no significant obstruction  . CARDIOVERSION N/A 03/19/2016   Procedure: CARDIOVERSION;  Surgeon: Pixie Casino, MD;  Location: [facility] ENDOSCOPY;  Service: Cardiovascular;  Laterality: N/A;  . CARDIOVERSION N/A 07/10/2016   Procedure: CARDIOVERSION;  Surgeon: Pixie Casino, MD;  Location: [facility] ENDOSCOPY;  Service: Cardiovascular;  Laterality: N/A;  . ILIAC ARTERY STENT Bilateral 06/2009   common iliac kissing stents (8X24 mm)  . KNEE ARTHROSCOPY Bilateral   . LUMBAR DISC SURGERY     spinal stenosis  . MULTIPLE EXTRACTIONS WITH ALVEOLOPLASTY N/A 11/12/2016   Procedure: EXTRACTION OF TOOTH #'S 18, 20-29, AND 31 WITH  ALVEOLOPLASTY AND BILATERAL MANDIBULAR TORI REDUCTIONS.;  Surgeon: Kayla Hopkins, DDS;  Location: [facility];  Service: Oral Surgery;  Laterality: N/A;  . ROBOTIC ASSISTED TOTAL HYSTERECTOMY WITH SALPINGECTOMY Bilateral 09/23/2015   Procedure: ROBOTIC ASSISTED TOTAL HYSTERECTOMY BILATERALSALPINGECTOMY AND OOPHORECTOMY;  Surgeon: Kayla Salon, MD;  Location: [facility];  Service: Gynecology;  Laterality: Bilateral;  . TEE WITHOUT CARDIOVERSION N/A 10/13/2016   Procedure: TRANSESOPHAGEAL ECHOCARDIOGRAM (TEE);  Surgeon: Kayla Perla, MD;  Location: [facility];  Service: Cardiovascular;  Laterality: N/A;  . [place]  . VULVECTOMY N/A 09/23/2015   Procedure: WIDE EXCISION VULVECTOMY  WLE of vulvar VIN 2;  Surgeon: Kayla Salon, MD;  Location: [facility];  Service: Gynecology;  Laterality: N/A;     Current Outpatient Prescriptions  Medication Sig Dispense Refill  . acetaminophen (TYLENOL) 500 MG chewable tablet Chew 1,000 mg by mouth every 6 (six) hours as needed for pain.    Marland Hopkins apixaban (ELIQUIS) 5 MG TABS tablet Take 1 tablet (5 mg total) by mouth 2 (two) times daily. 180 tablet 3  . atorvastatin (LIPITOR) 40 MG tablet  Take 1 tablet (40 mg total) by mouth daily. (Patient taking differently: Take 40 mg by mouth daily at 6 PM. ) 90 tablet 3  . chlorhexidine (PERIDEX) 0.12 % solution Rinse with 15 mls twice daily for 30 seconds. Use after breakfast and at bedtime. Spit out excess. Do not swallow. 480 mL prn  . diltiazem (CARDIZEM CD) 180 MG 24 hr capsule Take 1 capsule (180 mg total) by mouth daily. 90 capsule 3  . esomeprazole (NEXIUM) 20 MG capsule Take 20 mg by mouth at bedtime.     . fish oil-omega-3 fatty acids 1000 MG capsule Take 1 g by mouth at bedtime.     . furosemide (LASIX) 40 MG tablet Take 1 tablet (40 mg total) by mouth daily. 90 tablet 3  . metoprolol succinate (TOPROL XL) 50 MG 24 hr tablet Take 1-2 tablets (50-100 mg total) by mouth daily. Okay to take additional 0.5-1 tablet daily as needed. (Patient taking differently: Take 50 mg by mouth See admin instructions. Okay to take additional 0.5-1 tablet daily as needed for heart racing.) 270 tablet 3  No current facility-administered medications for this visit.     Allergies:   Pradaxa [dabigatran etexilate mesylate] and Zocor [simvastatin]    Social History:  The patient  reports that she quit smoking about 12 months ago. Her smoking use included Cigarettes. She has a 3.70 pack-year smoking history. She has never used smokeless tobacco. She reports that she drinks alcohol. She reports that she does not use drugs.   Family History:  The patient's family history includes Cancer in her maternal grandfather and paternal grandfather; Colon polyps in her sister; Heart attack in her father; Heart disease in her father; Hypertension in her brother and mother; Lung cancer in her mother; Ovarian cancer in her mother.    ROS:  Please see the history of present illness.   Otherwise, review of systems are positive for none.   All other systems are reviewed and negative.    PHYSICAL EXAM: VS:  BP 124/88 (BP Location: Left Arm, Patient Position: Sitting, Cuff  Size: Large)   Pulse 66   Ht 5\' 6"  (1.676 m)   Wt 225 lb 12.8 oz (102.4 kg)   LMP 11/09/2008   BMI 36.45 kg/m  , BMI Body mass index is 36.45 kg/m. GEN: Well nourished, well developed, in no acute distress  HEENT: normal  Neck: no JVD, carotid bruits, or masses Cardiac: Irregularly irregular; no murmurs, rubs, or gallops,no edema  Respiratory:  clear to auscultation bilaterally, normal work of breathing GI: soft, nontender, nondistended, + BS MS: no deformity or atrophy  Skin: warm and dry, no rash Neuro:  Strength and sensation are intact Psych: euthymic mood, full affect   EKG:  EKG is not ordered today.    Recent Labs: 02/10/2016: TSH 1.124 02/12/2016: Magnesium 2.0 10/26/2016: ALT 20; Brain Natriuretic Peptide 138.4 11/11/2016: BUN 17; Creatinine, Ser 0.89; Hemoglobin 13.8; Platelets 243; Potassium 4.5; Sodium 139    Lipid Panel    Component Value Date/Time   CHOL 188 06/08/2012 1526   TRIG 474.0 (H) 06/08/2012 1526   HDL 46.00 06/08/2012 1526   CHOLHDL 4 06/08/2012 1526   VLDL 94.8 (H) 06/08/2012 1526   LDLCALC (H) 08/11/2008 0340    147        Total Cholesterol/HDL:CHD Risk Coronary Heart Disease Risk Table                     Men   Women  1/2 Average Risk   3.4   3.3   LDLDIRECT 77.8 06/08/2012 1526      Wt Readings from Last 3 Encounters:  11/24/16 225 lb 12.8 oz (102.4 kg)  11/12/16 227 lb (103 kg)  11/11/16 226 lb 8 oz (102.7 kg)       ASSESSMENT AND PLAN:  1. Chronic diastolic heart failure: Recent worsening likely due to valvular heart disease and atrial fibrillation. Volume overload improved after adding small dose furosemide.   2. Mitral and tricuspid valve regurgitation: She appears to be symptomatic from this. I personally reviewed her TEE images. I agree that her best option is surgical repair especially with her relatively young age. I recommend proceeding with a right and left cardiac catheterization before surgery to exclude coronary artery  disease. I discussed the procedure in detail as well as risks and benefits. Hold anticoagulation 2 days before the procedure.  3. Persistent atrial fibrillation: Cardioversion was not successful in restoring sinus rhythm. Continue anticoagulation with Eliquis. Continue rate control with diltiazem and metoprolol.   The patient will undergo surgical maze  procedure during her surgery.  4. Peripheral arterial disease: No claudication. Most recent noninvasive vascular evaluation in October 2016 showed patent iliac stents.  5. Essential hypertension: Blood pressure is well controlled.   6. Hyperlipidemia: Continue treatment with atorvastatin with a target LDL of less than 70.    Disposition:   FU with me in 2 months  Signed,  Kathlyn Sacramento, MD  11/24/2016 9:45 AM    [place]

## 2016-11-24 NOTE — Patient Instructions (Signed)
Medication Instructions:  Your physician recommends that you continue on your current medications as directed. Please refer to the Current Medication list given to you today.  Labwork: Your physician recommends that you have lab work today: CBC, BMP and PT/INR  Testing/Procedures: Your physician has requested that you have a cardiac catheterization. Cardiac catheterization is used to diagnose and/or treat various heart conditions. Doctors may recommend this procedure for a number of different reasons. The most common reason is to evaluate chest pain. Chest pain can be a symptom of coronary artery disease (CAD), and cardiac catheterization can show whether plaque is narrowing or blocking your heart's arteries. This procedure is also used to evaluate the valves, as well as measure the blood flow and oxygen levels in different parts of your heart. For further information please visit [email or web address]. Please follow instruction sheet, as given.  Follow-Up: Your physician recommends that you schedule a follow-up appointment in: 2 MONTHS with Dr Fletcher Anon   Any Other Special Instructions Will Be Listed Below (If Applicable).     If you need a refill on your cardiac medications before your next appointment, please call your pharmacy.

## 2016-11-25 LAB — PROTIME-INR
INR: 1.1
PROTHROMBIN TIME: 11.6 s — AB (ref 9.0–11.5)

## 2016-11-28 DIAGNOSIS — G4733 Obstructive sleep apnea (adult) (pediatric): Secondary | ICD-10-CM | POA: Diagnosis not present

## 2016-11-30 ENCOUNTER — Encounter: Payer: Commercial Managed Care - HMO | Admitting: Thoracic Surgery (Cardiothoracic Vascular Surgery)

## 2016-12-02 ENCOUNTER — Ambulatory Visit (HOSPITAL_COMMUNITY)
Admission: RE | Admit: 2016-12-02 | Discharge: 2016-12-02 | Disposition: A | Discharge status: Home / Self Care | Payer: Medicare HMO | Source: Ambulatory Visit | Attending: Cardiovascular Disease | Admitting: Cardiovascular Disease

## 2016-12-02 ENCOUNTER — Encounter (HOSPITAL_COMMUNITY)
Admission: RE | Disposition: A | Discharge status: Home / Self Care | Payer: Self-pay | Source: Ambulatory Visit | Attending: Cardiovascular Disease

## 2016-12-02 DIAGNOSIS — I739 Peripheral vascular disease, unspecified: Secondary | ICD-10-CM | POA: Diagnosis not present

## 2016-12-02 DIAGNOSIS — F4310 Post-traumatic stress disorder, unspecified: Secondary | ICD-10-CM | POA: Insufficient documentation

## 2016-12-02 DIAGNOSIS — I110 Hypertensive heart disease with heart failure: Secondary | ICD-10-CM | POA: Diagnosis not present

## 2016-12-02 DIAGNOSIS — I481 Persistent atrial fibrillation: Secondary | ICD-10-CM | POA: Diagnosis not present

## 2016-12-02 DIAGNOSIS — K219 Gastro-esophageal reflux disease without esophagitis: Secondary | ICD-10-CM | POA: Insufficient documentation

## 2016-12-02 DIAGNOSIS — F329 Major depressive disorder, single episode, unspecified: Secondary | ICD-10-CM | POA: Diagnosis not present

## 2016-12-02 DIAGNOSIS — G43909 Migraine, unspecified, not intractable, without status migrainosus: Secondary | ICD-10-CM | POA: Insufficient documentation

## 2016-12-02 DIAGNOSIS — I340 Nonrheumatic mitral (valve) insufficiency: Secondary | ICD-10-CM | POA: Diagnosis present

## 2016-12-02 DIAGNOSIS — I2510 Atherosclerotic heart disease of native coronary artery without angina pectoris: Secondary | ICD-10-CM | POA: Diagnosis not present

## 2016-12-02 DIAGNOSIS — Z8371 Family history of colonic polyps: Secondary | ICD-10-CM | POA: Diagnosis not present

## 2016-12-02 DIAGNOSIS — Z801 Family history of malignant neoplasm of trachea, bronchus and lung: Secondary | ICD-10-CM | POA: Diagnosis not present

## 2016-12-02 DIAGNOSIS — I059 Rheumatic mitral valve disease, unspecified: Secondary | ICD-10-CM

## 2016-12-02 DIAGNOSIS — I5032 Chronic diastolic (congestive) heart failure: Secondary | ICD-10-CM | POA: Insufficient documentation

## 2016-12-02 DIAGNOSIS — Z87891 Personal history of nicotine dependence: Secondary | ICD-10-CM | POA: Insufficient documentation

## 2016-12-02 DIAGNOSIS — Z8249 Family history of ischemic heart disease and other diseases of the circulatory system: Secondary | ICD-10-CM | POA: Diagnosis not present

## 2016-12-02 DIAGNOSIS — G4733 Obstructive sleep apnea (adult) (pediatric): Secondary | ICD-10-CM | POA: Insufficient documentation

## 2016-12-02 DIAGNOSIS — R69 Illness, unspecified: Secondary | ICD-10-CM | POA: Diagnosis not present

## 2016-12-02 DIAGNOSIS — Z7901 Long term (current) use of anticoagulants: Secondary | ICD-10-CM | POA: Diagnosis not present

## 2016-12-02 DIAGNOSIS — Z8041 Family history of malignant neoplasm of ovary: Secondary | ICD-10-CM | POA: Insufficient documentation

## 2016-12-02 DIAGNOSIS — I081 Rheumatic disorders of both mitral and tricuspid valves: Secondary | ICD-10-CM | POA: Insufficient documentation

## 2016-12-02 DIAGNOSIS — E785 Hyperlipidemia, unspecified: Secondary | ICD-10-CM | POA: Diagnosis not present

## 2016-12-02 HISTORY — PX: CARDIAC CATHETERIZATION: SHX172

## 2016-12-02 SURGERY — RIGHT/LEFT HEART CATH AND CORONARY ANGIOGRAPHY

## 2016-12-02 MED ORDER — IOPAMIDOL (ISOVUE-370) INJECTION 76%
INTRAVENOUS | Status: AC
  Filled 2016-12-02: qty 100

## 2016-12-02 MED ORDER — HEPARIN (PORCINE) IN NACL 2-0.9 UNIT/ML-% IJ SOLN
INTRAMUSCULAR | Status: DC | PRN
  Administered 2016-12-02: 1000 mL

## 2016-12-02 MED ORDER — MIDAZOLAM HCL 2 MG/2ML IJ SOLN
INTRAMUSCULAR | Status: AC
  Filled 2016-12-02: qty 2

## 2016-12-02 MED ORDER — HEPARIN (PORCINE) IN NACL 2-0.9 UNIT/ML-% IJ SOLN
INTRAMUSCULAR | Status: AC
  Filled 2016-12-02: qty 1000

## 2016-12-02 MED ORDER — FENTANYL CITRATE (PF) 100 MCG/2ML IJ SOLN
INTRAMUSCULAR | Status: AC
  Filled 2016-12-02: qty 2

## 2016-12-02 MED ORDER — SODIUM CHLORIDE 0.9 % IV SOLN
INTRAVENOUS | Status: DC
  Administered 2016-12-02: 12:00:00 via INTRAVENOUS

## 2016-12-02 MED ORDER — SODIUM CHLORIDE 0.9 % IV SOLN: INTRAVENOUS | Status: DC

## 2016-12-02 MED ORDER — IOPAMIDOL (ISOVUE-370) INJECTION 76%
INTRAVENOUS | Status: DC | PRN
  Administered 2016-12-02: 80 mL via INTRA_ARTERIAL

## 2016-12-02 MED ORDER — LIDOCAINE HCL (PF) 1 % IJ SOLN
INTRAMUSCULAR | Status: AC
Start: 2016-12-02 — End: 2016-12-02
  Filled 2016-12-02: qty 30

## 2016-12-02 MED ORDER — HEPARIN SODIUM (PORCINE) 1000 UNIT/ML IJ SOLN
INTRAMUSCULAR | Status: DC | PRN
  Administered 2016-12-02: 4000 [IU] via INTRAVENOUS

## 2016-12-02 MED ORDER — ASPIRIN 81 MG PO CHEW
CHEWABLE_TABLET | ORAL | Status: AC
  Administered 2016-12-02: 81 mg via ORAL
  Filled 2016-12-02: qty 1

## 2016-12-02 MED ORDER — SODIUM CHLORIDE 0.9 % IV SOLN: 250.0000 mL | INTRAVENOUS | Status: DC | PRN

## 2016-12-02 MED ORDER — SODIUM CHLORIDE 0.9% FLUSH: 3.0000 mL | Freq: Two times a day (BID) | INTRAVENOUS | Status: DC

## 2016-12-02 MED ORDER — SODIUM CHLORIDE 0.9% FLUSH: 3.0000 mL | INTRAVENOUS | Status: DC | PRN

## 2016-12-02 MED ORDER — VERAPAMIL HCL 2.5 MG/ML IV SOLN
INTRAVENOUS | Status: AC
  Filled 2016-12-02: qty 2

## 2016-12-02 MED ORDER — HEPARIN SODIUM (PORCINE) 1000 UNIT/ML IJ SOLN
INTRAMUSCULAR | Status: AC
  Filled 2016-12-02: qty 1

## 2016-12-02 MED ORDER — HEPARIN (PORCINE) IN NACL 2-0.9 UNIT/ML-% IJ SOLN
INTRAMUSCULAR | Status: DC | PRN
  Administered 2016-12-02: 10 mL via INTRA_ARTERIAL

## 2016-12-02 MED ORDER — ASPIRIN 81 MG PO CHEW
81.0000 mg | CHEWABLE_TABLET | ORAL | Status: AC
  Administered 2016-12-02: 81 mg via ORAL

## 2016-12-02 MED ORDER — FENTANYL CITRATE (PF) 100 MCG/2ML IJ SOLN
INTRAMUSCULAR | Status: DC | PRN
  Administered 2016-12-02 (×4): 50 ug via INTRAVENOUS

## 2016-12-02 MED ORDER — LIDOCAINE HCL (PF) 1 % IJ SOLN
INTRAMUSCULAR | Status: DC | PRN
  Administered 2016-12-02 (×2): 2 mL

## 2016-12-02 MED ORDER — MIDAZOLAM HCL 2 MG/2ML IJ SOLN
INTRAMUSCULAR | Status: DC | PRN
  Administered 2016-12-02 (×3): 1 mg via INTRAVENOUS

## 2016-12-02 SURGICAL SUPPLY — 16 items
CATH BALLN WEDGE 5F 110CM (CATHETERS) ×2 IMPLANT
CATH EXPO 5F FL3.5 (CATHETERS) ×2 IMPLANT
CATH EXPO 5FR ANG PIGTAIL 145 (CATHETERS) ×2 IMPLANT
CATH OPTITORQUE JACKY 4.0 5F (CATHETERS) ×2 IMPLANT
DEVICE RAD COMP TR BAND LRG (VASCULAR PRODUCTS) ×2 IMPLANT
GLIDESHEATH SLEND SS 6F .021 (SHEATH) ×4 IMPLANT
GUIDEWIRE .025 260CM (WIRE) ×2 IMPLANT
GUIDEWIRE INQWIRE 1.5J.035X260 (WIRE) ×1 IMPLANT
INQWIRE 1.5J .035X260CM (WIRE) ×2
PACK CARDIAC CATHETERIZATION (CUSTOM PROCEDURE TRAY) ×2 IMPLANT
SHEATH FAST CATH BRACH 5F 5CM (SHEATH) ×2 IMPLANT
SYR MEDRAD MARK V 150ML (SYRINGE) ×2 IMPLANT
TRANSDUCER W/STOPCOCK (MISCELLANEOUS) ×2 IMPLANT
TUBING CIL FLEX 10 FLL-RA (TUBING) ×2 IMPLANT
WIRE EMERALD 3MM-J .025X260CM (WIRE) ×2 IMPLANT
WIRE HI TORQ VERSACORE-J 145CM (WIRE) ×2 IMPLANT

## 2016-12-02 NOTE — H&P (View-Only) (Signed)
Cardiology Office Note   Date:  11/24/2016   ID:  Kayla Hopkins, DOB 1961/09/08, MRN [id number]  PCP:  Hoyt Koch, MD  Cardiologist:   Kathlyn Sacramento, MD   No chief complaint on file.     History of Present Illness: Kayla Hopkins is a 56 y.o. female who presents for a follow-up visit regarding persistent atrial fibrillation, mitral regurgitation, tricuspid regurgitation and peripheral arterial disease.  She has no known CAD per cath back in 2009. She does have known history of peripheral arterial disease, persistent atrial fibrillation, hypertension, sleep apnea and hyperlipidemia.  She is status post bilateral iliac stenting with covered stent on the right side for restenosis.  She had persistent atrial fibrillation recently and was started on anticoagulation. Cardioversion was attempted on May 11 but was not successful and restoring sinus rhythm. She was diagnosed with atrial fibrillation last year. A diversion was not successful in restoring sinus rhythm. She was referred to Dr. Rayann Heman for consideration of catheter ablation. However, a TEE was performed which showed severe tricuspid regurgitation, moderate mitral regurgitation and significant biatrial enlargement. She was seen by Dr. Roxy Manns who recommended mitral and tricuspid valve repairs and surgical maze. The patient underwent dental extraction recently without complications. She was seen for worsening dyspnea early this month and was started on small dose furosemide 40 mg once daily. She reports improvement in symptoms although she continues to be limited by significant exertional dyspnea and palpitations. She also reports chest pain especially at night. The chest pain is substernal and described as tightness feeling. She has been using her CPAP machine on a regular basis.  Past Medical History:  Diagnosis Date  . Anxiety   . Arthritis    "knees, back" (02/11/2016)  . Chronic diastolic CHF (congestive heart  failure) ([place])    a. 02/2016 Echo; EF 55-60%, no rwma, mild to mod MR, mod dil LA, PASP 70mmHg;  b. 10/2016 TEE: nl EF, mod MR, mod-sev TR.  . Claudication ([place])       . Daily headache   . Depression   . GERD (gastroesophageal reflux disease)   . Herpes    no outbreaks-has Valtrex to take PRN  . Hyperlipidemia   . Hypertension   . Migraine    "probably have 3/year; had one today; tried several medications-no relief" (02/11/2016)  . Moderate mitral regurgitation    a. 10/2016 TEE: nl EF, mod MR, mod to severe LAE, mod to sev TR.  . Moderate to Severe Tricuspid regurgitation    a. 10/2014 TEE: mod-sev TR.  [name] Obstructive sleep apnea    wears CPAP  . Peripheral vascular disease ([place]) 06/11/2009   a. 06/2009 Bilateral common iliac kissing stents (8X24 mm);  b. 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 She underwent successful balloon angioplasty;  c. Restenosis in RCIA in 12/13 s/p  Covered stent; d. 05/2013: 70% in distal left common iliac artery s/p self expanding stent placement; e. 08/2016 ABI: R 1.1, L 0.99, nl Abd Ao.  [name] Persistent atrial fibrillation ([facility])    a. s/p prior DCCV x 2;  b. failed medical therapy-->poor candidate for PVI 2/2 mod MR and LAE.  [name] PTSD (post-traumatic stress disorder)   . Shortness of breath dyspnea    patient states she has had lung test that came back normal  . Tobacco abuse    stopped smoking, 2016  . Wears dentures   .  Wears glasses     Past Surgical History:  Procedure Laterality Date  . ABDOMINAL ANGIOGRAM N/A 05/11/2012   Procedure: ABDOMINAL ANGIOGRAM;  Surgeon: [name], MD;  Location: [facility] CATH LAB;  Service: Cardiovascular;  Laterality: N/A;  . ABDOMINAL AORTAGRAM N/A 10/19/2012   Procedure: ABDOMINAL Maxcine Ham;  Surgeon: [name], MD;  Location: [facility] CATH LAB;  Service: Cardiovascular;  Laterality: N/A;  . ABDOMINAL AORTAGRAM N/A 05/10/2013   Procedure: ABDOMINAL  Maxcine Ham;  Surgeon: [name], MD;  Location: [facility] CATH LAB;  Service: Cardiovascular;  Laterality: N/A;  . BACK SURGERY    . CARDIAC CATHETERIZATION  2009   no significant obstruction  . CARDIOVERSION N/A 03/19/2016   Procedure: CARDIOVERSION;  Surgeon: Pixie Casino, MD;  Location: [facility] ENDOSCOPY;  Service: Cardiovascular;  Laterality: N/A;  . CARDIOVERSION N/A 07/10/2016   Procedure: CARDIOVERSION;  Surgeon: Pixie Casino, MD;  Location: [facility] ENDOSCOPY;  Service: Cardiovascular;  Laterality: N/A;  . ILIAC ARTERY STENT Bilateral 06/2009   common iliac kissing stents (8X24 mm)  . KNEE ARTHROSCOPY Bilateral   . LUMBAR DISC SURGERY     spinal stenosis  . MULTIPLE EXTRACTIONS WITH ALVEOLOPLASTY N/A 11/12/2016   Procedure: EXTRACTION OF TOOTH #'S 18, 20-29, AND 31 WITH  ALVEOLOPLASTY AND BILATERAL MANDIBULAR TORI REDUCTIONS.;  Surgeon: Lenn Cal, DDS;  Location: [facility];  Service: Oral Surgery;  Laterality: N/A;  . ROBOTIC ASSISTED TOTAL HYSTERECTOMY WITH SALPINGECTOMY Bilateral 09/23/2015   Procedure: ROBOTIC ASSISTED TOTAL HYSTERECTOMY BILATERALSALPINGECTOMY AND OOPHORECTOMY;  Surgeon: [name], MD;  Location: [facility];  Service: Gynecology;  Laterality: Bilateral;  . TEE WITHOUT CARDIOVERSION N/A 10/13/2016   Procedure: TRANSESOPHAGEAL ECHOCARDIOGRAM (TEE);  Surgeon: Lelon Perla, MD;  Location: [facility];  Service: Cardiovascular;  Laterality: N/A;  . [place]  . VULVECTOMY N/A 09/23/2015   Procedure: WIDE EXCISION VULVECTOMY  WLE of vulvar VIN 2;  Surgeon: [name], MD;  Location: [facility];  Service: Gynecology;  Laterality: N/A;     Current Outpatient Prescriptions  Medication Sig Dispense Refill  . acetaminophen (TYLENOL) 500 MG chewable tablet Chew 1,000 mg by mouth every 6 (six) hours as needed for pain.    [name] apixaban (ELIQUIS) 5 MG TABS tablet Take 1 tablet (5 mg total) by mouth 2 (two) times daily. 180 tablet 3  . atorvastatin (LIPITOR) 40 MG tablet  Take 1 tablet (40 mg total) by mouth daily. (Patient taking differently: Take 40 mg by mouth daily at 6 PM. ) 90 tablet 3  . chlorhexidine (PERIDEX) 0.12 % solution Rinse with 15 mls twice daily for 30 seconds. Use after breakfast and at bedtime. Spit out excess. Do not swallow. 480 mL prn  . diltiazem (CARDIZEM CD) 180 MG 24 hr capsule Take 1 capsule (180 mg total) by mouth daily. 90 capsule 3  . esomeprazole (NEXIUM) 20 MG capsule Take 20 mg by mouth at bedtime.     . fish oil-omega-3 fatty acids 1000 MG capsule Take 1 g by mouth at bedtime.     . furosemide (LASIX) 40 MG tablet Take 1 tablet (40 mg total) by mouth daily. 90 tablet 3  . metoprolol succinate (TOPROL XL) 50 MG 24 hr tablet Take 1-2 tablets (50-100 mg total) by mouth daily. Okay to take additional 0.5-1 tablet daily as needed. (Patient taking differently: Take 50 mg by mouth See admin instructions. Okay to take additional 0.5-1 tablet daily as needed for heart racing.) 270 tablet 3  No current facility-administered medications for this visit.     Allergies:   Pradaxa [dabigatran etexilate mesylate] and Zocor [simvastatin]    Social History:  The patient  reports that she quit smoking about 12 months ago. Her smoking use included Cigarettes. She has a 3.70 pack-year smoking history. She has never used smokeless tobacco. She reports that she drinks alcohol. She reports that she does not use drugs.   Family History:  The patient's family history includes Cancer in her maternal grandfather and paternal grandfather; Colon polyps in her sister; Heart attack in her father; Heart disease in her father; Hypertension in her brother and mother; Lung cancer in her mother; Ovarian cancer in her mother.    ROS:  Please see the history of present illness.   Otherwise, review of systems are positive for none.   All other systems are reviewed and negative.    PHYSICAL EXAM: VS:  BP 124/88 (BP Location: Left Arm, Patient Position: Sitting, Cuff  Size: Large)   Pulse 66   Ht 5\' 6"  (1.676 m)   Wt 225 lb 12.8 oz (102.4 kg)   LMP 11/09/2008   BMI 36.45 kg/m  , BMI Body mass index is 36.45 kg/m. GEN: Well nourished, well developed, in no acute distress  HEENT: normal  Neck: no JVD, carotid bruits, or masses Cardiac: Irregularly irregular; no murmurs, rubs, or gallops,no edema  Respiratory:  clear to auscultation bilaterally, normal work of breathing GI: soft, nontender, nondistended, + BS MS: no deformity or atrophy  Skin: warm and dry, no rash Neuro:  Strength and sensation are intact Psych: euthymic mood, full affect   EKG:  EKG is not ordered today.    Recent Labs: 02/10/2016: TSH 1.124 02/12/2016: Magnesium 2.0 10/26/2016: ALT 20; Brain Natriuretic Peptide 138.4 11/11/2016: BUN 17; Creatinine, Ser 0.89; Hemoglobin 13.8; Platelets 243; Potassium 4.5; Sodium 139    Lipid Panel    Component Value Date/Time   CHOL 188 06/08/2012 1526   TRIG 474.0 (H) 06/08/2012 1526   HDL 46.00 06/08/2012 1526   CHOLHDL 4 06/08/2012 1526   VLDL 94.8 (H) 06/08/2012 1526   LDLCALC (H) 08/11/2008 0340    147        Total Cholesterol/HDL:CHD Risk Coronary Heart Disease Risk Table                     Men   Women  1/2 Average Risk   3.4   3.3   LDLDIRECT 77.8 06/08/2012 1526      Wt Readings from Last 3 Encounters:  11/24/16 225 lb 12.8 oz (102.4 kg)  11/12/16 227 lb (103 kg)  11/11/16 226 lb 8 oz (102.7 kg)       ASSESSMENT AND PLAN:  1. Chronic diastolic heart failure: Recent worsening likely due to valvular heart disease and atrial fibrillation. Volume overload improved after adding small dose furosemide.   2. Mitral and tricuspid valve regurgitation: She appears to be symptomatic from this. I personally reviewed her TEE images. I agree that her best option is surgical repair especially with her relatively young age. I recommend proceeding with a right and left cardiac catheterization before surgery to exclude coronary artery  disease. I discussed the procedure in detail as well as risks and benefits. Hold anticoagulation 2 days before the procedure.  3. Persistent atrial fibrillation: Cardioversion was not successful in restoring sinus rhythm. Continue anticoagulation with Eliquis. Continue rate control with diltiazem and metoprolol.   The patient will undergo surgical maze  procedure during her surgery.  4. Peripheral arterial disease: No claudication. Most recent noninvasive vascular evaluation in October 2016 showed patent iliac stents.  5. Essential hypertension: Blood pressure is well controlled.   6. Hyperlipidemia: Continue treatment with atorvastatin with a target LDL of less than 70.    Disposition:   FU with me in 2 months  Signed,  Kathlyn Sacramento, MD  11/24/2016 9:45 AM    [place]

## 2016-12-02 NOTE — Discharge Instructions (Signed)
Resume Eliquis tomorrow ( 12/03/2016)   Radial Site Care Introduction Refer to this sheet in the next few weeks. These instructions provide you with information about caring for yourself after your procedure. Your health care provider may also give you more specific instructions. Your treatment has been planned according to current medical practices, but problems sometimes occur. Call your health care provider if you have any problems or questions after your procedure. What can I expect after the procedure? After your procedure, it is typical to have the following:  Bruising at the radial site that usually fades within 1-2 weeks.  Blood collecting in the tissue (hematoma) that may be painful to the touch. It should usually decrease in size and tenderness within 1-2 weeks. Follow these instructions at home:  Take medicines only as directed by your health care provider.  You may shower 24-48 hours after the procedure or as directed by your health care provider. Remove the bandage (dressing) and gently wash the site with plain soap and water. Pat the area dry with a clean towel. Do not rub the site, because this may cause bleeding.  Do not take baths, swim, or use a hot tub until your health care provider approves.  Check your insertion site every day for redness, swelling, or drainage.  Do not apply powder or lotion to the site.  Do not flex or bend the affected arm for 24 hours or as directed by your health care provider.  Do not push or pull heavy objects with the affected arm for 24 hours or as directed by your health care provider.  Do not lift over 10 lb (4.5 kg) for 5 days after your procedure or as directed by your health care provider.  Ask your health care provider when it is okay to:  Return to work or school.  Resume usual physical activities or sports.  Resume sexual activity.  Do not drive home if you are discharged the same day as the procedure. Have someone else  drive you.  You may drive 24 hours after the procedure unless otherwise instructed by your health care provider.  Do not operate machinery or power tools for 24 hours after the procedure.  If your procedure was done as an outpatient procedure, which means that you went home the same day as your procedure, a responsible adult should be with you for the first 24 hours after you arrive home.  Keep all follow-up visits as directed by your health care provider. This is important. Contact a health care provider if:  You have a fever.  You have chills.  You have increased bleeding from the radial site. Hold pressure on the site. Get help right away if:  You have unusual pain at the radial site.  You have redness, warmth, or swelling at the radial site.  You have drainage (other than a small amount of blood on the dressing) from the radial site.  The radial site is bleeding, and the bleeding does not stop after 30 minutes of holding steady pressure on the site.  Your arm or hand becomes pale, cool, tingly, or numb. This information is not intended to replace advice given to you by your health care provider. Make sure you discuss any questions you have with your health care provider. Document Released: 11/28/2010 Document Revised: 04/02/2016 Document Reviewed: 05/14/2014  2017 [employer]

## 2016-12-02 NOTE — Interval H&P Note (Signed)
History and Physical Interval Note:  12/02/2016 1:14 PM  SEQOUIA CATRAMBONE  has presented today for surgery, with the diagnosis of mitral valve disease  The various methods of treatment have been discussed with the patient and family. After consideration of risks, benefits and other options for treatment, the patient has consented to  Procedure(s): Right/Left Heart Cath and Coronary Angiography (N/A) as a surgical intervention .  The patient's history has been reviewed, patient examined, no change in status, stable for surgery.  I have reviewed the patient's chart and labs.  Questions were answered to the patient's satisfaction.     Kayla Hopkins

## 2016-12-03 ENCOUNTER — Other Ambulatory Visit: Payer: Self-pay | Admitting: *Deleted

## 2016-12-03 ENCOUNTER — Ambulatory Visit (INDEPENDENT_AMBULATORY_CARE_PROVIDER_SITE_OTHER): Payer: Medicare HMO | Admitting: Thoracic Surgery (Cardiothoracic Vascular Surgery)

## 2016-12-03 ENCOUNTER — Encounter (HOSPITAL_COMMUNITY): Payer: Self-pay | Admitting: Cardiovascular Disease

## 2016-12-03 VITALS — BP 130/70 | HR 74 | Resp 16 | Ht 66.0 in | Wt 225.0 lb

## 2016-12-03 DIAGNOSIS — I481 Persistent atrial fibrillation: Secondary | ICD-10-CM

## 2016-12-03 DIAGNOSIS — I5032 Chronic diastolic (congestive) heart failure: Secondary | ICD-10-CM

## 2016-12-03 DIAGNOSIS — I361 Nonrheumatic tricuspid (valve) insufficiency: Secondary | ICD-10-CM

## 2016-12-03 DIAGNOSIS — I071 Rheumatic tricuspid insufficiency: Secondary | ICD-10-CM

## 2016-12-03 DIAGNOSIS — I340 Nonrheumatic mitral (valve) insufficiency: Secondary | ICD-10-CM | POA: Diagnosis not present

## 2016-12-03 DIAGNOSIS — I059 Rheumatic mitral valve disease, unspecified: Secondary | ICD-10-CM

## 2016-12-03 DIAGNOSIS — I4891 Unspecified atrial fibrillation: Secondary | ICD-10-CM

## 2016-12-03 DIAGNOSIS — I4819 Other persistent atrial fibrillation: Secondary | ICD-10-CM

## 2016-12-03 MED ORDER — AMIODARONE HCL 200 MG PO TABS: 200.0000 mg | ORAL_TABLET | Freq: Two times a day (BID) | ORAL | 0 refills | Status: DC

## 2016-12-03 MED FILL — Verapamil HCl IV Soln 2.5 MG/ML: INTRAVENOUS | Qty: 2 | Status: AC

## 2016-12-03 NOTE — Progress Notes (Signed)
[address]       [place], [address]             [phone]     CARDIOTHORACIC SURGERY OFFICE NOTE  Referring Provider is Thompson Grayer, MD  Primary Cardiologist is [name], MD PCP is Hoyt Koch, MD  HPI:  Patient returns to the office today for follow-up of long-standing persistent atrial fibrillation, mitral regurgitation, tricuspid regurgitation, and chronic diastolic congestive heart failure. She was originally seen in consultation on 10/26/2016.  Since then she has been seen in follow-up by Ignacia Bayley at [facility] who noted that the patient had acute exacerbation of chronic diastolic congestive heart failure and prescribed lasix.  In addition the patient was seen in consultation by Dr. Lawana Chambers who performed dental extraction and cleared the patient for surgery.  More recently the patient was seen in follow up by Dr. Fletcher Anon who performed left and right heart catheterization yesterday. The patient was found to have mild nonobstructive coronary artery disease with normal left ventricular systolic function. The patient was noted to have at least moderate mitral regurgitation with mildly elevated left ventricular end-diastolic pressure.  The patient returns to the office today to discuss treatment options further. She reports that she is feeling a little better since she was started on Lasix, but she still gets short of breath with very mild low level activity. She has not had any chest pain or chest tightness. She reports no new problems or complaints.   Current Outpatient Prescriptions  Medication Sig Dispense Refill  . acetaminophen (TYLENOL) 500 MG chewable tablet Chew 1,000 mg by mouth every 6 (six) hours as needed for pain.    [name] apixaban (ELIQUIS) 5 MG TABS tablet Take 1 tablet (5 mg total) by mouth 2 (two) times daily. 180 tablet 3  . atorvastatin (LIPITOR) 40 MG tablet Take 1 tablet (40 mg total) by mouth daily. (Patient taking  differently: Take 40 mg by mouth daily at 6 PM. ) 90 tablet 3  . diltiazem (CARDIZEM CD) 180 MG 24 hr capsule Take 1 capsule (180 mg total) by mouth daily. 90 capsule 3  . esomeprazole (NEXIUM) 20 MG capsule Take 20 mg by mouth at bedtime.     . furosemide (LASIX) 40 MG tablet Take 1 tablet (40 mg total) by mouth daily. 90 tablet 3  . metoprolol succinate (TOPROL XL) 50 MG 24 hr tablet Take 1-2 tablets (50-100 mg total) by mouth daily. Okay to take additional 0.5-1 tablet daily as needed. (Patient taking differently: Take 50 mg by mouth See admin instructions. Okay to take additional 0.5-1 tablet daily as needed for heart racing.) 270 tablet 3  . Omega-3 Fatty Acids (FISH OIL) 1200 MG CAPS Take 1,200 mg by mouth daily.     No current facility-administered medications for this visit.       Physical Exam:   BP 130/70 (BP Location: Left Arm, Patient Position: Sitting, Cuff Size: Large)   Pulse 74   Resp 16   Ht 5\' 6"  (1.676 m)   Wt 225 lb (102.1 kg)   LMP 11/09/2008   SpO2 99% Comment: ON RA  BMI 36.32 kg/m   General:  Obese but well appearing  Chest:   Clear to auscultation  CV:   Irregular rate and rhythm with systolic murmur  Incisions:  n/a  Abdomen:  Soft nontender  Extremities:  Warm and well-perfused  Diagnostic Tests:  Right/Left Heart Cath and Coronary Angiography  Conclusion     Mid RCA lesion, 20 %stenosed.  LV end diastolic pressure is mildly elevated.  The left ventricular systolic function is normal.  The left ventricular ejection fraction is 55-65% by visual estimate.  There is moderate (3+) mitral regurgitation.   1. No significant coronary artery disease. 2. Normal LV systolic function with at least moderate mitral regurgitation by left ventricular angiography. 3. Mildly elevated left ventricular end-diastolic pressure at 18 mmHg. 4. RA pressure was 7 mmHg and RV pressure was 30/ 2 mmHg. The Swan-Ganz catheter could not be advanced beyond the right  ventricle due to significant tortuosity in the venous system from the right arm. Thus, cardiac output could not be calculated. RV systolic pressure is normal and thus the patient is unlikely to have significant pulmonary hypertension.  Recommendations: Proceed with surgical repair as planned. The patient does not require coronary revascularization. Avoid catheterization via the arm in the future due to severe radial artery spasm leading to significant pain and catheter entrapment.   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 Right heart catheterization was performed using a 5 French Swan-Ganz catheter. The patient was found to have tortuous venous system which required a glide wire to navigate. However, due to this tortuosity, I was not able to advance the Swan-Ganz catheter beyond the right ventricle. The right wrist was prepped, draped, and anesthetized with 1% lidocaine. Using the modified Seldinger technique, a 5 French sheath was introduced into the right radial artery. 2.5 mg of verapamil was administered through the sheath, weight-based unfractionated heparin was administered intravenously.A Jackie catheter was used for selective coronary angiography. A pigtail catheter was used for left ventriculography. A JL 3.5 catheter was needed to engage the left main. Catheter exchanges were performed over an exchange length guidewire. The patient had severe right arm pain and right radial artery spasm throughout the procedure that made it extremely difficult to torque the catheters. This is in spite of generous sedation and pain management. She was also given extra intra-arterial verapamil. There were no immediate procedural complications. A TR band was used for radial hemostasis at the  completion of the procedure. The patient was transferred to the post catheterization recovery area for further monitoring.   Estimated blood loss <50 mL.  During this procedure the patient was administered the following to achieve and maintain moderate conscious sedation: Versed 3 mg, Fentanyl 200 mcg, while the patient's heart rate, blood pressure, and oxygen saturation were continuously monitored. The period of conscious sedation was 47 minutes, of which I was present face-to-face 100% of this time.    Coronary Findings   Dominance: Right  Left Main  Vessel is angiographically normal.  Left Anterior Descending  The vessel exhibits minimal luminal irregularities.  First Diagonal Branch  The vessel exhibits minimal luminal irregularities.  Second Diagonal Branch  Vessel is angiographically normal.  Third Diagonal Branch  Vessel is small in size. Vessel is angiographically normal.  Ramus Intermedius  Vessel is large. Vessel is angiographically normal.  Left Circumflex  Vessel is small.  First Obtuse Marginal Branch  Vessel is angiographically normal.  Second Obtuse Marginal Branch  Vessel is angiographically normal.  Right Coronary Artery  Mid RCA lesion, 20% stenosed.  Right Posterior Descending Artery  Vessel is angiographically normal.  Right Posterior Atrioventricular Branch  Vessel is  angiographically normal.  First Right Posterolateral  Vessel is angiographically normal.  Second Right Posterolateral  Vessel is angiographically normal.  Third Right Posterolateral  Vessel is angiographically normal.  Wall Motion              Left Heart   Left Ventricle The left ventricular size is normal. The left ventricular systolic function is normal. LV end diastolic pressure is mildly elevated. The left ventricular ejection fraction is 55-65% by visual estimate. No regional wall motion abnormalities. There is moderate (3+) mitral regurgitation.    Coronary Diagrams    Diagnostic Diagram     Implants     No implant documentation for this case.  PACS Images   Show images for Cardiac catheterization   Link to Procedure Log   Procedure Log    Hemo Data   Flowsheet Row Most Recent Value  RA A Wave -99 mmHg  RA V Wave 7 mmHg  RA Mean 6 mmHg  RV Systolic Pressure 30 mmHg  RV Diastolic Pressure 0 mmHg  RV EDP 8 mmHg  AO Systolic Pressure Q000111Q mmHg  AO Diastolic Pressure 79 mmHg  AO Mean [id number] mmHg  LV Systolic Pressure 123456 mmHg  LV Diastolic Pressure 6 mmHg  LV EDP 18 mmHg  Arterial Occlusion Pressure Extended Systolic Pressure A999333 mmHg  Arterial Occlusion Pressure Extended Diastolic Pressure 87 mmHg  Arterial Occlusion Pressure Extended Mean Pressure 122 mmHg  Left Ventricular Apex Extended Systolic Pressure 99991111 mmHg  Left Ventricular Apex Extended Diastolic Pressure 13 mmHg  Left Ventricular Apex Extended EDP Pressure 22 mmHg      Impression:  Patient has a long history of atrial fibrillation dating back more than 5 years ago. She has remained in persistent atrial fibrillation for at least 9 months and has failed attempts at DC cardioversion on 2 occasions, most recently on oral flecainide. She describes worsening symptoms of exertional shortness of breath, fatigue, orthopnea, PND, and lower extremity edema consistent with chronic diastolic congestive heart failure, New York Heart Association functional class III. The patient's long-standing symptoms of shortness of breath are likely further exacerbated by the presence of morbid obesity, obstructive sleep apnea, and history of long-standing tobacco abuse, although she was able to quit smoking last March.  Symptoms of exertional shortness of breath have improved slightly on optimal medical therapy with recent addition of diuretics, but the patient still gets short of breath with relatively mild low level activity.  I have personally reviewed the patient's recent transesophageal echocardiogram  and diagnostic cardiac catheterization.  The patient has mild left ventricular hypertrophy with some degree of diastolic dysfunction but low normal left ventricular systolic function with ejection fraction estimated 55-60% in the setting of significant mitral regurgitation.  There is smoke in the left atrium but no flow reversal in the pulmonary veins. There is severe left atrial enlargement, severe right atrial enlargement, and severe tricuspid regurgitation.  The right ventricle was not dilated and there appeared to be normal right ventricular systolic function.  There appears to be mild thickening of the mitral valve leaflets but essentially normal leaflet mobility with primarily type I mitral valve dysfunction and at least moderate mitral regurgitation. 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  Diagnostic cardiac catheterization performed yesterday demonstrates mild nonobstructive coronary artery disease and confirms the presence of at least moderate mitral regurgitation despite recent addition of oral diuretic therapy.  I agree that the  likelihood of successful restoration of atrial fibrillation using catheter-based ablation would probably be relatively low. Options at this time include continued medical therapy for congestive heart failure versus mitral valve repair, tricuspid valve repair, and Maze procedure.   Given the patient's relatively young age, a more aggressive approach might be reasonable.    Plan:  I have again reviewed treatment options at length with the patient and her husband in the office today. We discussed the relative risks and benefits of continued medical therapy with close observation versus proceeding with surgery for mitral valve repair, tricuspid valve repair, and Maze procedure.  The rationale for elective surgery has been explained, including a  comparison between surgery and continued medical therapy with close follow-up.  The likelihood of successful and durable valve repair has been discussed with particular reference to the findings of their recent echocardiogram.  Based upon these findings and previous experience, I have quoted them a greater than 90 percent likelihood of successful valve repair.  In the unlikely event that their valve cannot be successfully repaired, we discussed the possibility of replacing the mitral valve using a mechanical prosthesis with the attendant need for long-term anticoagulation versus the alternative of replacing it using a bioprosthetic tissue valve with its potential for late structural valve deterioration and failure, depending upon the patient's longevity.  The patient specifically requests that if the mitral valve must be replaced that it be done using a mechanical prosthetic valve valve.   The relative risks and benefits of performing a maze procedure at the time of their surgery was discussed at length, including the expected likelihood of long term freedom from recurrent symptomatic atrial fibrillation and/or atrial flutter.  The patient understands and accepts all potential risks of surgery including but not limited to risk of death, stroke or other neurologic complication, myocardial infarction, congestive heart failure, respiratory failure, renal failure, bleeding requiring transfusion and/or reexploration, arrhythmia, infection or other wound complications, pneumonia, pleural and/or pericardial effusion, pulmonary embolus, aortic dissection or other major vascular complication, or delayed complications related to valve repair or replacement including but not limited to structural valve deterioration and failure, thrombosis, embolization, endocarditis, or paravalvular leak.   Alternative surgical approaches have been discussed including a comparison between conventional sternotomy and minimally-invasive  techniques.  Because of the patient's morbid obesity and body habitus, she is not felt to be a very good candidate for mini thoracotomy approach.  We tentatively plan for surgery via conventional median sternotomy on Thursday, 12/10/2016. The patient has been instructed to stop taking Eliquis in anticipation of surgery. In addition, we have given her prescription for amiodarone.  All of her questions have been answered.    I spent in excess of 30 minutes during the conduct of this office consultation and >50% of this time involved direct face-to-face encounter with the patient for counseling and/or coordination of their care.    Valentina Gu. Roxy Manns, MD 12/03/2016 11:32 AM

## 2016-12-03 NOTE — Patient Instructions (Signed)
Stop taking Eliquis and begin taking amiodarone tomorrow  Continue taking all other medications without change through the day before surgery.  Have nothing to eat or drink after midnight the night before surgery.  On the morning of surgery take only Toprol XL, Cardizem CD and Nexium with a sip of water.

## 2016-12-08 ENCOUNTER — Ambulatory Visit (HOSPITAL_BASED_OUTPATIENT_CLINIC_OR_DEPARTMENT_OTHER)
Admission: RE | Admit: 2016-12-08 | Discharge: 2016-12-08 | Disposition: A | Discharge status: Home / Self Care | Payer: Medicare HMO | Source: Ambulatory Visit | Attending: Thoracic Surgery (Cardiothoracic Vascular Surgery) | Admitting: Thoracic Surgery (Cardiothoracic Vascular Surgery)

## 2016-12-08 ENCOUNTER — Ambulatory Visit (HOSPITAL_COMMUNITY)
Admission: RE | Admit: 2016-12-08 | Discharge: 2016-12-08 | Disposition: A | Discharge status: Home / Self Care | Payer: Medicare HMO | Source: Ambulatory Visit | Attending: Thoracic Surgery (Cardiothoracic Vascular Surgery) | Admitting: Thoracic Surgery (Cardiothoracic Vascular Surgery)

## 2016-12-08 ENCOUNTER — Encounter (HOSPITAL_COMMUNITY)
Admission: RE | Admit: 2016-12-08 | Discharge: 2016-12-08 | Disposition: A | Discharge status: Home / Self Care | Payer: Medicare HMO | Source: Ambulatory Visit | Attending: Thoracic Surgery (Cardiothoracic Vascular Surgery) | Admitting: Thoracic Surgery (Cardiothoracic Vascular Surgery)

## 2016-12-08 ENCOUNTER — Encounter (HOSPITAL_COMMUNITY): Payer: Self-pay

## 2016-12-08 DIAGNOSIS — T82856A Stenosis of peripheral vascular stent, initial encounter: Secondary | ICD-10-CM | POA: Diagnosis not present

## 2016-12-08 DIAGNOSIS — I4891 Unspecified atrial fibrillation: Secondary | ICD-10-CM

## 2016-12-08 DIAGNOSIS — D62 Acute posthemorrhagic anemia: Secondary | ICD-10-CM | POA: Diagnosis not present

## 2016-12-08 DIAGNOSIS — I071 Rheumatic tricuspid insufficiency: Secondary | ICD-10-CM

## 2016-12-08 DIAGNOSIS — I5033 Acute on chronic diastolic (congestive) heart failure: Secondary | ICD-10-CM | POA: Diagnosis not present

## 2016-12-08 DIAGNOSIS — J9811 Atelectasis: Secondary | ICD-10-CM

## 2016-12-08 DIAGNOSIS — E1151 Type 2 diabetes mellitus with diabetic peripheral angiopathy without gangrene: Secondary | ICD-10-CM | POA: Diagnosis not present

## 2016-12-08 DIAGNOSIS — D696 Thrombocytopenia, unspecified: Secondary | ICD-10-CM | POA: Diagnosis not present

## 2016-12-08 DIAGNOSIS — I481 Persistent atrial fibrillation: Secondary | ICD-10-CM | POA: Diagnosis not present

## 2016-12-08 DIAGNOSIS — I6523 Occlusion and stenosis of bilateral carotid arteries: Secondary | ICD-10-CM | POA: Insufficient documentation

## 2016-12-08 DIAGNOSIS — I340 Nonrheumatic mitral (valve) insufficiency: Secondary | ICD-10-CM

## 2016-12-08 DIAGNOSIS — I081 Rheumatic disorders of both mitral and tricuspid valves: Secondary | ICD-10-CM | POA: Diagnosis not present

## 2016-12-08 DIAGNOSIS — Z23 Encounter for immunization: Secondary | ICD-10-CM | POA: Diagnosis not present

## 2016-12-08 HISTORY — DX: Other chronic pain: G89.29

## 2016-12-08 HISTORY — DX: Dizziness and giddiness: R42

## 2016-12-08 HISTORY — DX: Dorsalgia, unspecified: M54.9

## 2016-12-08 HISTORY — DX: Urgency of urination: R39.15

## 2016-12-08 HISTORY — DX: Personal history of other diseases of the nervous system and sense organs: Z86.69

## 2016-12-08 HISTORY — DX: Frequency of micturition: R35.0

## 2016-12-08 LAB — VAS US DOPPLER PRE CABG
LCCADSYS: -73 cm/s
LCCAPDIAS: 21 cm/s
LEFT ECA DIAS: -23 cm/s
LICADSYS: -67 cm/s
LICAPDIAS: -21 cm/s
LICAPSYS: -118 cm/s
Left CCA dist dias: -24 cm/s
Left CCA prox sys: 86 cm/s
Left ICA dist dias: -11 cm/s
RCCAPSYS: 63 cm/s
RIGHT VERTEBRAL DIAS: -15 cm/s
Right CCA prox dias: 16 cm/s
Right cca dist sys: -65 cm/s

## 2016-12-08 LAB — PULMONARY FUNCTION TEST
DL/VA % pred: 82 %
DL/VA: 4.14 ml/min/mmHg/L
DLCO COR % PRED: 65 %
DLCO UNC: 17.63 ml/min/mmHg
DLCO cor: 17.74 ml/min/mmHg
DLCO unc % pred: 65 %
FEF 25-75 Post: 1.94 L/sec
FEF 25-75 Pre: 2.09 L/sec
FEF2575-%Change-Post: -7 %
FEF2575-%Pred-Post: 71 %
FEF2575-%Pred-Pre: 76 %
FEV1-%CHANGE-POST: -1 %
FEV1-%PRED-POST: 75 %
FEV1-%Pred-Pre: 76 %
FEV1-POST: 2.2 L
FEV1-PRE: 2.22 L
FEV1FVC-%CHANGE-POST: -1 %
FEV1FVC-%Pred-Pre: 99 %
FEV6-%Change-Post: 0 %
FEV6-%PRED-PRE: 78 %
FEV6-%Pred-Post: 78 %
FEV6-POST: 2.82 L
FEV6-PRE: 2.81 L
FEV6FVC-%PRED-POST: 103 %
FEV6FVC-%PRED-PRE: 103 %
FVC-%CHANGE-POST: 0 %
FVC-%PRED-POST: 76 %
FVC-%PRED-PRE: 75 %
FVC-POST: 2.82 L
FVC-PRE: 2.81 L
POST FEV6/FVC RATIO: 100 %
PRE FEV6/FVC RATIO: 100 %
Post FEV1/FVC ratio: 78 %
Pre FEV1/FVC ratio: 79 %
RV % PRED: 90 %
RV: 1.78 L
TLC % PRED: 87 %
TLC: 4.71 L

## 2016-12-08 LAB — BLOOD GAS, ARTERIAL
Acid-Base Excess: 2.9 mmol/L — ABNORMAL HIGH (ref 0.0–2.0)
BICARBONATE: 25.7 mmol/L (ref 20.0–28.0)
Drawn by: 205171
FIO2: 0.21
O2 SAT: 98.2 %
PATIENT TEMPERATURE: 98.6
PO2 ART: 111 mmHg — AB (ref 83.0–108.0)
pCO2 arterial: 30.8 mmHg — ABNORMAL LOW (ref 32.0–48.0)
pH, Arterial: 7.53 — ABNORMAL HIGH (ref 7.350–7.450)

## 2016-12-08 LAB — CBC
HEMATOCRIT: 41.1 % (ref 36.0–46.0)
Hemoglobin: 13.1 g/dL (ref 12.0–15.0)
MCH: 28.6 pg (ref 26.0–34.0)
MCHC: 31.9 g/dL (ref 30.0–36.0)
MCV: 89.7 fL (ref 78.0–100.0)
Platelets: 276 10*3/uL (ref 150–400)
RBC: 4.58 MIL/uL (ref 3.87–5.11)
RDW: 13.9 % (ref 11.5–15.5)
WBC: 7.4 10*3/uL (ref 4.0–10.5)

## 2016-12-08 LAB — URINALYSIS, ROUTINE W REFLEX MICROSCOPIC
BACTERIA UA: NONE SEEN
Bilirubin Urine: NEGATIVE
Glucose, UA: NEGATIVE mg/dL
Ketones, ur: NEGATIVE mg/dL
Leukocytes, UA: NEGATIVE
Nitrite: NEGATIVE
PROTEIN: NEGATIVE mg/dL
SPECIFIC GRAVITY, URINE: 1.009 (ref 1.005–1.030)
pH: 7 (ref 5.0–8.0)

## 2016-12-08 LAB — PROTIME-INR
INR: 1.04
Prothrombin Time: 13.6 seconds (ref 11.4–15.2)

## 2016-12-08 LAB — COMPREHENSIVE METABOLIC PANEL
ALBUMIN: 4 g/dL (ref 3.5–5.0)
ALT: 23 U/L (ref 14–54)
ANION GAP: 12 (ref 5–15)
AST: 24 U/L (ref 15–41)
Alkaline Phosphatase: 115 U/L (ref 38–126)
BILIRUBIN TOTAL: 0.5 mg/dL (ref 0.3–1.2)
BUN: 18 mg/dL (ref 6–20)
CO2: 28 mmol/L (ref 22–32)
Calcium: 9.4 mg/dL (ref 8.9–10.3)
Chloride: 100 mmol/L — ABNORMAL LOW (ref 101–111)
Creatinine, Ser: 0.87 mg/dL (ref 0.44–1.00)
GFR calc Af Amer: >60 mL/min (ref >60)
GFR calc non Af Amer: >60 mL/min (ref >60)
GLUCOSE: 103 mg/dL — AB (ref 65–99)
Potassium: 3.9 mmol/L (ref 3.5–5.1)
SODIUM: 140 mmol/L (ref 135–145)
TOTAL PROTEIN: 7.3 g/dL (ref 6.5–8.1)

## 2016-12-08 LAB — SURGICAL PCR SCREEN
MRSA, PCR: NEGATIVE
Staphylococcus aureus: NEGATIVE

## 2016-12-08 LAB — ABO/RH: ABO/RH(D): A POS

## 2016-12-08 LAB — APTT: aPTT: 28 seconds (ref 24–36)

## 2016-12-08 MED ORDER — CHLORHEXIDINE GLUCONATE 4 % EX LIQD: 30.0000 mL | CUTANEOUS | Status: DC

## 2016-12-08 MED ORDER — ALBUTEROL SULFATE (2.5 MG/3ML) 0.083% IN NEBU
2.5000 mg | INHALATION_SOLUTION | Freq: Once | RESPIRATORY_TRACT | Status: AC
  Administered 2016-12-08: 2.5 mg via RESPIRATORY_TRACT

## 2016-12-08 NOTE — Pre-Procedure Instructions (Signed)
Kayla Hopkins  12/08/2016      [place], [address] [place] [address] Phone: [phone] Fax: [phone]  [facility] [place], [address] [place] [address] Phone: [phone] Fax: [phone]  [id number] - [place], [place] [address] [place] [address] Phone: [phone] Fax: [phone]    Your procedure is scheduled on Thurs, Feb 1 @ 7:30 AM  Report to [place] at 5:30 AM  Call this number if you have problems the morning of surgery:  [phone]   Remember:  Do not eat food or drink liquids after midnight.  Take these medicines the morning of surgery with A SIP OF WATER Amiodarone(Pacerone),Diltiazem(Cardizem),and Metoprolol(Toprol)             Stop taking your Eliquis as instructed by Dr.Owen. Also stop taking your Fish Oil along with any Vitamins or Herbal Medications.No Goody's,BC's,Aleve,Advil,Motrin,or Ibuprofen.    Do not wear jewelry, make-up or nail polish.  Do not wear lotions, powders, perfumes, or deoderant.  Do not shave 48 hours prior to surgery.   Do not bring valuables to the hospital.  [facility] is not responsible for any belongings or valuables.  Contacts, dentures or bridgework may not be worn into surgery.  Leave your suitcase in the car.  After surgery it may be brought to your room.  For patients admitted to the hospital, discharge time will be determined by your treatment team.  Patients discharged the day of surgery will not be allowed to drive home.    [facility]  Before surgery, you can play an important role.  Because skin is not sterile, your skin needs to be as free of germs as possible.  You can reduce the number of germs on you skin by washing with CHG (chlorahexidine  gluconate) soap before surgery.  CHG is an antiseptic cleaner which kills germs and bonds with the skin to continue killing germs even after washing.  Please DO NOT use if you have an allergy to CHG or antibacterial soaps.  If your skin becomes reddened/irritated stop using the CHG and inform your nurse when you arrive at Short Stay.  Do not shave (including legs and underarms) for at least 48 hours prior to the first CHG shower.  You may shave your face.  Please follow these instructions carefully:   1.  Shower with CHG Soap the night before surgery and the                                morning of Surgery.  2.  If you choose to wash your hair, wash your hair first as usual with your       normal shampoo.  3.  After you shampoo, rinse your hair and body thoroughly to remove the                      Shampoo.  4.  Use CHG as you would any other liquid soap.  You can apply chg directly       to the skin and wash gently with scrungie or a clean washcloth.  5.  Apply the CHG Soap to your body ONLY FROM THE NECK DOWN.  Do not use on open wounds or open sores.  Avoid contact with your eyes,       ears, mouth and genitals (private parts).  Wash genitals (private parts)       with your normal soap.  6.  Wash thoroughly, paying special attention to the area where your surgery        will be performed.  7.  Thoroughly rinse your body with warm water from the neck down.  8.  DO NOT shower/wash with your normal soap after using and rinsing off       the CHG Soap.  9.  Pat yourself dry with a clean towel.            10.  Wear clean pajamas.            11.  Place clean sheets on your bed the night of your first shower and do not        sleep with pets.  Day of Surgery  Do not apply any lotions/deoderants the morning of surgery.  Please wear clean clothes to the hospital/surgery center.    Please read over the following fact sheets that you were given. Pain Booklet, Coughing and Deep Breathing,  MRSA Information and Surgical Site Infection Prevention

## 2016-12-08 NOTE — Progress Notes (Signed)
Spoke with [facility] and they will obtain ABG when pt gets to them

## 2016-12-08 NOTE — Progress Notes (Addendum)
Cardiologist is Dr.Arida with last visit in epic from 11/24/16  Medical Md is Dr.Elizabeth Sharlet [place]  Multiple echo reports in epic  Stress test in epic from 2015  Heart cath in epic from 12/02/16  Sleep study in epic from 2017  EKG in epic from 12-02-16

## 2016-12-08 NOTE — H&P (Signed)
[name]       [name]             [phone]          CARDIOTHORACIC SURGERY HISTORY AND PHYSICAL EXAM  Referring Provider is Thompson Grayer, MD  Primary Cardiologist is [name], MD PCP is Hoyt Koch, MD      Chief Complaint  Patient presents with  . Mitral Regurgitation    Tricuspid Regurgitation and Persistenet AF.Kayla KitchenMarland KitchenTEE 10/23/16    HPI:  Patient is a 56 year old morbidly obese female with history of persistent atrial fibrillation, hypertension, obstructive sleep apnea, peripheral arterial disease, post traumatic stress disorder, previous history of long-standing tobacco abuse, and recently discovered mitral regurgitation and tricuspid regurgitation who has been referred for surgical consultation to discuss treatment options for management of atrial fibrillation and symptoms of congestive heart failure.  The patient's history of atrial fibrillation dates back more than 5 years ago when Kayla Hopkins first presented with symptoms of palpitations and was diagnosed with paroxysmal atrial fibrillation. Kayla Hopkins was started on Pradaxa and followed for several years by Dr. Acie Fredrickson.  In 2010 Kayla Hopkins underwent bilateral common iliac artery stent placement by Dr. Trula Slade for bilateral hip claudication. Kayla Hopkins initially did well but later developed recurrent claudication and was referred to Dr. Fletcher Anon. Kayla Hopkins was found to have in-stent restenosis of the right common iliac artery stent and underwent successful balloon angioplasty. Kayla Hopkins has been followed intermittently ever since by Dr. Fletcher Anon.  In November 2016 the patient underwent hysterectomy. Kayla Hopkins developed atypical chest pain and worsening shortness breath and was later found to be in persistent atrial fibrillation.  An echocardiogram was performed demonstrating ejection fraction estimated 55-60% with dilated left atrium and what was felt to be mild mitral regurgitation. Stress Myoview exam was felt to be low risk  for ischemia. Systemic anticoagulation was resumed using Eliquis and medications were added for rate control. Patient developed worsening shortness of breath, PND, orthopnea, and lower extremity edema.  Kayla Hopkins was seen in follow-up by Dr. Fletcher Anon and underwent an attempted cardioversion that was unsuccessful. Kayla Hopkins was referred to the atrial fibrillation clinic.  Kayla Hopkins underwent a sleep study that confirmed the presence of obstructive sleep apnea and was started on CPAP.  Kayla Hopkins noted some improvement in Kayla Hopkins ability to sleep, but Kayla Hopkins continued to experience worsening shortness of breath, palpitations and episodes of PND.  Kayla Hopkins was started on flecainide and underwent a second attempt at cardioversion which failed.  Kayla Hopkins was seen in follow-up in the atrial fibrillation clinic by Dr. Rayann Heman discussed the possibility of catheter-based ablation. TEE was performed 10/13/2016 revealed the presence of moderate mitral regurgitation and severe tricuspid regurgitation. Plans for ablation were canceled and the patient was referred for surgical consultation.  Kayla Hopkins was originally seen in consultation on 10/26/2016.  Since then Kayla Hopkins has been seen in follow-up by Ignacia Bayley at [facility] who noted that the patient had acute exacerbation of chronic diastolic congestive heart failure and prescribed lasix.  In addition the patient was seen in consultation by Dr. Lawana Chambers who performed dental extraction and cleared the patient for surgery.  More recently the patient was seen in follow up by Dr. Fletcher Anon who performed left and right heart catheterization yesterday. The patient was found to have mild nonobstructive coronary artery disease with normal left ventricular systolic function. The patient was noted to have at least moderate mitral regurgitation with mildly elevated left ventricular end-diastolic pressure.  The patient returns  to the office today to discuss treatment options further. Kayla Hopkins reports that Kayla Hopkins is feeling a little better since Kayla Hopkins  was started on Lasix, but Kayla Hopkins still gets short of breath with very mild low level activity. Kayla Hopkins has not had any chest pain or chest tightness. Kayla Hopkins reports no new problems or complaints.  The patient is married and lives with Kayla Hopkins husband in [place]. Kayla Hopkins has been disabled since 2002 secondary to post traumatic stress disorder caused by an on-the-job injury Kayla Hopkins suffered in 2002.  Kayla Hopkins states that up until approximately one year ago Kayla Hopkins remained reasonably active physically. Over the past year Kayla Hopkins has developed progressive symptoms of exertional shortness of breath, PND, orthopnea, and lower extremity edema. Kayla Hopkins complains of chronic pain in both feet which Kayla Hopkins blames on the swelling in Kayla Hopkins feet. Pain is described as sharp pain in sensations of "pins and needles" that is unrelated to ambulation. Kayla Hopkins also has pain in both hands which Kayla Hopkins attributes to swelling. Kayla Hopkins has gained approximately 40 pounds over the past year which Kayla Hopkins feels is primarily due to accumulation of fluid. Kayla Hopkins denies any history of chest pain or chest tightness either with activity or at rest. Kayla Hopkins uses CPAP at night and this does seem to help Kayla Hopkins sleep but Kayla Hopkins still cannot lay flat in bed and Kayla Hopkins has occasional episodes of shortness of breath that wakes Kayla Hopkins from Kayla Hopkins sleep gasping. Kayla Hopkins has had some abdominal bloating and vague discomfort although Kayla Hopkins bowel function is normal, appetite is stable, and Kayla Hopkins has no difficulty swallowing.  Kayla Hopkins has a history of long-standing tobacco abuse although Kayla Hopkins states that Kayla Hopkins quit smoking completely last March.   Past Medical History:  Diagnosis Date  . Anxiety   . Arthritis    "knees, back" (02/11/2016)  . Chronic diastolic CHF (congestive heart failure) ([facility])    a. 02/2016 Echo; EF 55-60%, no rwma, mild to mod MR, mod dil LA, PASP 67mmHg;  b. 10/2016 TEE: nl EF, mod MR, mod-sev TR.  . Claudication ([facility])       . Daily headache   . Depression   . GERD (gastroesophageal reflux disease)   . Herpes    no  outbreaks-has Valtrex to take PRN  . Hyperlipidemia   . Hypertension   . Migraine    "probably have 3/year; had one today; tried several medications-no relief" (02/11/2016)  . Moderate mitral regurgitation    a. 10/2016 TEE: nl EF, mod MR, mod to severe LAE, mod to sev TR.  . Moderate to Severe Tricuspid regurgitation    a. 10/2014 TEE: mod-sev TR.  Kayla Hopkins Obstructive sleep apnea    wears CPAP  . Peripheral vascular disease ([facility]) 06/11/2009   a. 06/2009 Bilateral common iliac kissing stents (8X24 mm);  b. 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 Kayla Hopkins underwent successful balloon angioplasty;  c. Restenosis in RCIA in 12/13 s/p  Covered stent; d. 05/2013: 70% in distal left common iliac artery s/p self expanding stent placement; e. 08/2016 ABI: R 1.1, L 0.99, nl Abd Ao.  Kayla Hopkins Persistent atrial fibrillation ([facility])    a. s/p prior DCCV x 2;  b. failed medical therapy-->poor candidate for PVI 2/2 mod MR and LAE.  Kayla Hopkins PTSD (post-traumatic stress disorder)   . Shortness of breath dyspnea    patient states Kayla Hopkins has had lung test that came back normal  . Tobacco abuse    stopped smoking, 2016  . Wears  dentures   . Wears glasses     Past Surgical History:  Procedure Laterality Date  . ABDOMINAL ANGIOGRAM N/A 05/11/2012   Procedure: ABDOMINAL ANGIOGRAM;  Surgeon: [name], MD;  Location: [facility] CATH [facility];  Service: Cardiovascular;  Laterality: N/A;  . ABDOMINAL AORTAGRAM N/A 10/19/2012   Procedure: ABDOMINAL Maxcine Ham;  Surgeon: [name], MD;  Location: [facility];  Service: Cardiovascular;  Laterality: N/A;  . ABDOMINAL AORTAGRAM N/A 05/10/2013   Procedure: ABDOMINAL Maxcine Ham;  Surgeon: [name], MD;  Location: [facility];  Service: Cardiovascular;  Laterality: N/A;  . BACK SURGERY    . CARDIAC CATHETERIZATION  2009   no significant obstruction  . CARDIAC CATHETERIZATION N/A 12/02/2016   Procedure: Right/Left Heart Cath  and Coronary Angiography;  Surgeon: [name], MD;  Location: [facility] CV LAB;  Service: Cardiovascular;  Laterality: N/A;  . CARDIOVERSION N/A 03/19/2016   Procedure: CARDIOVERSION;  Surgeon: Pixie Casino, MD;  Location: [facility] ENDOSCOPY;  Service: Cardiovascular;  Laterality: N/A;  . CARDIOVERSION N/A 07/10/2016   Procedure: CARDIOVERSION;  Surgeon: Pixie Casino, MD;  Location: [facility] ENDOSCOPY;  Service: Cardiovascular;  Laterality: N/A;  . ILIAC ARTERY STENT Bilateral 06/2009   common iliac kissing stents (8X24 mm)  . KNEE ARTHROSCOPY Bilateral   . LUMBAR DISC SURGERY     spinal stenosis  . MULTIPLE EXTRACTIONS WITH ALVEOLOPLASTY N/A 11/12/2016   Procedure: EXTRACTION OF TOOTH #'S 18, 20-29, AND 31 WITH  ALVEOLOPLASTY AND BILATERAL MANDIBULAR TORI REDUCTIONS.;  Surgeon: Lenn Cal, DDS;  Location: [facility];  Service: Oral Surgery;  Laterality: N/A;  . ROBOTIC ASSISTED TOTAL HYSTERECTOMY WITH SALPINGECTOMY Bilateral 09/23/2015   Procedure: ROBOTIC ASSISTED TOTAL HYSTERECTOMY BILATERALSALPINGECTOMY AND OOPHORECTOMY;  Surgeon: [name], MD;  Location: [facility];  Service: Gynecology;  Laterality: Bilateral;  . TEE WITHOUT CARDIOVERSION N/A 10/13/2016   Procedure: TRANSESOPHAGEAL ECHOCARDIOGRAM (TEE);  Surgeon: Lelon Perla, MD;  Location: [facility];  Service: Cardiovascular;  Laterality: N/A;  . [place]  . VULVECTOMY N/A 09/23/2015   Procedure: WIDE EXCISION VULVECTOMY  WLE of vulvar VIN 2;  Surgeon: [name], MD;  Location: [facility];  Service: Gynecology;  Laterality: N/A;    Family History  Problem Relation Age of Onset  . Hypertension Mother   . Ovarian cancer Mother   . Lung cancer Mother   . Heart disease Father   . Heart attack Father   . Hypertension Brother   . Colon polyps Sister   . Cancer Maternal Grandfather     lung  . Cancer Paternal Grandfather     throat     Social History Social History  Substance Use Topics  . Smoking status:  Former Smoker    Packs/day: 0.10    Years: 37.00    Types: Cigarettes    Quit date: 11/10/2015  . Smokeless tobacco: Never Used     Comment:  "using Media planner  . Alcohol use 0.0 oz/week     Comment: occcasional    Prior to Admission medications   Medication Sig Start Date End Date Taking? Authorizing Provider  acetaminophen (TYLENOL) 500 MG tablet Take 1,000 mg by mouth every 6 (six) hours as needed for mild pain.   Yes Historical Provider, MD  atorvastatin (LIPITOR) 40 MG tablet Take 1 tablet (40 mg total) by mouth daily. Patient taking differently: Take 40 mg by mouth daily at 6 PM.  08/28/16  Yes Lonn Georgia, PA-C  diltiazem (CARDIZEM CD) 180 MG 24 hr capsule Take 1 capsule (180 mg total) by mouth daily. 11/24/16  Yes [name], MD  amiodarone (PACERONE) 200 MG tablet Take 1 tablet (200 mg total) by mouth 2 (two) times daily. 12/03/16   [name], MD  apixaban (ELIQUIS) 5 MG TABS tablet Take 1 tablet (5 mg total) by mouth 2 (two) times daily. Patient not taking: Reported on 12/03/2016 09/17/16   Thompson Grayer, MD  esomeprazole (NEXIUM) 20 MG capsule Take 20 mg by mouth at bedtime.     Historical Provider, MD  furosemide (LASIX) 40 MG tablet Take 1 tablet (40 mg total) by mouth daily. 11/13/16 02/11/17  [name], MD  metoprolol succinate (TOPROL XL) 50 MG 24 hr tablet Take 1-2 tablets (50-100 mg total) by mouth daily. Okay to take additional 0.5-1 tablet daily as needed. Patient taking differently: Take 50 mg by mouth See admin instructions. Okay to take additional 0.5-1 tablet daily as needed for heart racing. 09/09/16   Thompson Grayer, MD  Omega-3 Fatty Acids (FISH OIL) 1200 MG CAPS Take 1,200 mg by mouth daily.    Historical Provider, MD    Allergies  Allergen Reactions  . Pradaxa [Dabigatran Etexilate Mesylate] Other (See Comments)    Gastritis   . Zocor [Simvastatin] Other (See Comments)    MYALGIAS LEG PAINS      Review of Systems:               General:                      normal appetite, decreased energy, + weight gain, no weight loss, no fever             Cardiac:                       no chest pain with exertion, no chest pain at rest, +SOB with exertion, + resting SOB, + PND, + orthopnea, + palpitations, + arrhythmia, + atrial fibrillation, + LE edema, + dizzy spells, no syncope             Respiratory:                 + shortness of breath, no home oxygen, no productive cough, no dry cough, no bronchitis, no wheezing, no hemoptysis, no asthma, no pain with inspiration or cough, + sleep apnea, + CPAP at night             GI:                               no difficulty swallowing, no reflux, no frequent heartburn, no hiatal hernia, mild abdominal pain, no constipation, no diarrhea, no hematochezia, no hematemesis, no melena             GU:                              no dysuria,  + frequency, no urinary tract infection, no hematuria, no kidney stones, no kidney disease             Vascular:                     + pain suggestive of claudication, + pain in feet, no leg cramps, no varicose veins, no DVT, no  non-healing foot ulcer             Neuro:                         no stroke, no TIA's, no seizures, + headaches, no temporary blindness one eye,  no slurred speech, + possible peripheral neuropathy, + chronic pain, no instability of gait, no memory/cognitive dysfunction             Musculoskeletal:         no arthritis, no joint swelling, + myalgias, + difficulty walking, somewhat decreased mobility              Skin:                            no rash, no itching, + skin infections, no pressure sores or ulcerations             Psych:                         + anxiety, + depression, no nervousness, no unusual recent stress             Eyes:                           + blurry vision, no floaters, no recent vision changes, does not wears glasses or contacts             ENT:                            no hearing loss, + loose or  painful teeth, + upper plate dentures, last saw dentist many years ago             Hematologic:               no easy bruising, no abnormal bleeding, no clotting disorder, no frequent epistaxis             Endocrine:                   no diabetes, does not check CBG's at home                           Physical Exam:              BP 130/70 (BP Location: Left Arm, Patient Position: Sitting, Cuff Size: Large)   Pulse 96 Comment: IRREG  Resp 16   Ht 5\' 6"  (1.676 m)   Wt 225 lb (102.1 kg)   LMP 11/09/2008   SpO2 96% Comment: ON RA  BMI 36.32 kg/m              General:                      Obese,  well-appearing             HEENT:                       Unremarkable              Neck:  no JVD, no bruits, no adenopathy              Chest:                          clear to auscultation, symmetrical breath sounds, no wheezes, no rhonchi              CV:                              Irregular rate and rhythm, no murmur              Abdomen:                    soft, non-tender, no masses              Extremities:                 warm, well-perfused, pulses not palpable, + bilateral LE edema             Rectal/GU                   Deferred             Neuro:                         Grossly non-focal and symmetrical throughout             Skin:                            Clean and dry, no rashes, no breakdown   Diagnostic Tests:  Transthoracic Echocardiography  Patient: Kayla Hopkins, Kayla Hopkins MR #: [id number] Study Date: 02/11/2016 Gender: F Age: 17 Height: 167.6 cm Weight: 88.8 kg BSA: 2.06 m^2 Pt. Status: Room: [place], [place] Manele, Scott T PERFORMING [facility], Inpatient  cc:  ------------------------------------------------------------------- LV EF: 55% -  60%  ------------------------------------------------------------------- Indications: Chest pain 786.51.  ------------------------------------------------------------------- History: PMH: Atrial fibrillation. Risk factors: Current tobacco use. Hypertension. Dyslipidemia.  ------------------------------------------------------------------- Study Conclusions  - Left ventricle: The cavity size was normal. Wall thickness was normal. Systolic function was normal. The estimated ejection fraction was in the range of 55% to 60%. Wall motion was normal; there were no regional wall motion abnormalities. - Mitral valve: There was mild to moderate regurgitation. - Left atrium: The atrium was moderately dilated. - Pulmonary arteries: Systolic pressure was moderately increased. PA peak pressure: 51 mm Hg (S). - Pericardium, extracardiac: A trivial pericardial effusion was identified.  Impressions:  - Normal LV systolic function; moderate LAE; mild to moderate MR; mild TR; moderately elevated pulmonary pressure.  Transthoracic echocardiography. M-mode, complete 2D, spectral Doppler, and color Doppler. Birthdate: Patient birthdate: [birth year]. Age: Patient is 56 yr old. Sex: Gender: female. BMI: 31.6 kg/m^2. Blood pressure: 150/87 Patient status: Inpatient. Study date: Study date: 02/11/2016. Study time: 02:24 PM. Location: Bedside.  -------------------------------------------------------------------  ------------------------------------------------------------------- Left ventricle: The cavity size was normal. Wall thickness was normal. Systolic function was normal. The estimated ejection fraction was in the range of 55% to 60%. Wall motion was normal; there were no regional wall motion abnormalities. The study was not technically sufficient to allow evaluation of LV diastolic dysfunction due to atrial  fibrillation.  ------------------------------------------------------------------- Aortic valve: Trileaflet; mildly calcified  leaflets. Mobility was not restricted. Doppler: Transvalvular velocity was within the normal range. There was no stenosis. There was no regurgitation.  ------------------------------------------------------------------- Aorta: Aortic root: The aortic root was normal in size.  ------------------------------------------------------------------- Mitral valve: Structurally normal valve. Mobility was not restricted. Doppler: Transvalvular velocity was within the normal range. There was no evidence for stenosis. There was mild to moderate regurgitation.  ------------------------------------------------------------------- Left atrium: The atrium was moderately dilated.  ------------------------------------------------------------------- Right ventricle: The cavity size was normal. Systolic function was normal.  ------------------------------------------------------------------- Pulmonic valve: Doppler: Transvalvular velocity was within the normal range. There was no evidence for stenosis.  ------------------------------------------------------------------- Tricuspid valve: Structurally normal valve. Doppler: Transvalvular velocity was within the normal range. There was mild regurgitation.  ------------------------------------------------------------------- Pulmonary artery: Systolic pressure was moderately increased.  ------------------------------------------------------------------- Right atrium: The atrium was normal in size.  ------------------------------------------------------------------- Pericardium: A trivial pericardial effusion was identified.  ------------------------------------------------------------------- Systemic veins: Inferior vena cava: The vessel was normal in  size.  ------------------------------------------------------------------- Measurements  Left ventricle Value Reference LV ID, ED, PLAX chordal (L) 37.9 mm 43 - 52 LV ID, ES, PLAX chordal 25.2 mm 23 - 38 LV fx shortening, PLAX chordal 34 % >=29 LV PW thickness, ED 8.25 mm --------- IVS/LV PW ratio, ED (H) 1.35 <=1.3  Ventricular septum Value Reference IVS thickness, ED 11.1 mm ---------  Aorta Value Reference Aortic root ID, ED 25 mm ---------  Left atrium Value Reference LA ID, A-P, ES 43 mm --------- LA ID/bsa, A-P 2.08 cm/m^2 <=2.2 LA volume, S 77 ml --------- LA volume/bsa, S 37.3 ml/m^2 --------- LA volume, ES, 1-p A4C 83 ml --------- LA volume/bsa, ES, 1-p A4C 40.2 ml/m^2 --------- LA volume, ES, 1-p A2C 71 ml --------- LA volume/bsa, ES, 1-p A2C 34.4 ml/m^2 ---------  Pulmonary arteries Value Reference PA pressure, S, DP (H) 51 mm Hg <=30  Tricuspid valve Value Reference Tricuspid regurg peak velocity 327 cm/s --------- Tricuspid peak RV-RA gradient 43 mm Hg ---------  Systemic veins Value Reference Estimated CVP 8 mm Hg ---------  Right ventricle Value Reference RV pressure, S, DP (H) 51 mm Hg <=30  Legend: (L) and (H) mark values outside specified reference  range.  ------------------------------------------------------------------- Prepared and Electronically Authenticated by  Kirk Ruths 2017-04-04T16:24:03   Transesophageal Echocardiography  Patient: Kysha, Patman MR #: [id number] Study Date: 10/13/2016 Gender: F Age: 24 Height: 167.6 cm Weight: 100 kg BSA: 2.2 m^2 Pt. Status: Room:  [facility]worth Crenshaw PERFORMING Kirk Ruths SONOGRAPHER Johny Chess, RDCS, CCT Emelia Loron, Amber  cc:  ------------------------------------------------------------------- LV EF: 55% - 60%  ------------------------------------------------------------------- Indications: Atrial fibrillation - 427.31.  ------------------------------------------------------------------- Study Conclusions  - Left ventricle: Systolic function was normal. The estimated ejection fraction was in the range of 55% to 60%. Wall motion was normal; there were no regional wall motion abnormalities. - Aortic valve: No evidence of vegetation. - Mitral valve: No evidence of vegetation. There was moderate regurgitation. - Left atrium: The atrium was moderately dilated. No evidence of thrombus in the atrial cavity or appendage. There was moderate spontaneous echo contrast (&quot;smoke&quot;) in the cavity and the appendage. - Right atrium: The atrium was mildly dilated. - Atrial septum: No defect or patent foramen ovale was identified. - Tricuspid valve: No evidence of vegetation. There was severe regurgitation. - Pulmonic valve: No evidence of vegetation. - Pericardium, extracardiac: A trivial pericardial effusion was identified.  Impressions:  - Normal LV systolic function; biatrial enlargement; no LAA thrombus; moderate MR; severe TR.  ------------------------------------------------------------------- Study data:  Study  status: Routine. Consent: The risks, benefits, and alternatives to the procedure were explained to the patient and informed consent was obtained. Procedure: Initial setup. The patient was brought to the laboratory. Surface ECG leads were monitored. Sedation. Conscious sedation was administered by cardiology staff. Transesophageal echocardiography. An adult multiplane transesophageal probe was inserted by the attending cardiologistwithout difficulty. Image quality was adequate. Study completion: The patient tolerated the procedure well. There were no complications. Administered medications: Midazolam, 4mg , IV. Fentanyl, 31mcg, IV. Diagnostic transesophageal echocardiography. 2D and color Doppler. Birthdate: Patient birthdate: 1961/10/13. Age: Patient is 56 yr old. Sex: Gender: female. BMI: 35.6 kg/m^2. Blood pressure: 156/79 Patient status: Outpatient. Study date: Study date: 10/13/2016. Study time: 10:00 AM. Location: Endoscopy.  -------------------------------------------------------------------  ------------------------------------------------------------------- Left ventricle: Systolic function was normal. The estimated ejection fraction was in the range of 55% to 60%. Wall motion was normal; there were no regional wall motion abnormalities.  ------------------------------------------------------------------- Aortic valve: Mildly thickened leaflets. Cusp separation was normal. No evidence of vegetation. Doppler: There was no regurgitation.  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  ------------------------------------------------------------------- Mitral valve: Mildly thickened leaflets . Leaflet separation was normal. No evidence of vegetation. Doppler: There was  moderate regurgitation.  ------------------------------------------------------------------- Left atrium: The atrium was moderately dilated. No evidence of thrombus in the atrial cavity or appendage. There was moderate spontaneous echo contrast (&quot;smoke&quot;) in the cavity and the appendage.  ------------------------------------------------------------------- Atrial septum: No defect or patent foramen ovale was identified.  ------------------------------------------------------------------- Right ventricle: The cavity size was normal. Systolic function was normal.  ------------------------------------------------------------------- Pulmonic valve: Structurally normal valve. Cusp separation was normal. No evidence of vegetation. Doppler: There was trivial regurgitation.  ------------------------------------------------------------------- Tricuspid valve: Structurally normal valve. Leaflet separation was normal. No evidence of vegetation. Doppler: There was severe regurgitation.  ------------------------------------------------------------------- Right atrium: The atrium was mildly dilated.  ------------------------------------------------------------------- Pericardium: A trivial pericardial effusion was identified.  ------------------------------------------------------------------- Prepared and Electronically Authenticated by  Kirk Ruths 2017-12-05T15:58:25  Right/Left Heart Cath and Coronary Angiography  Conclusion     Mid RCA lesion, 20 %stenosed.  LV end diastolic pressure is mildly elevated.  The left ventricular systolic function is normal.  The left ventricular ejection fraction is 55-65% by visual estimate.  There is moderate (3+) mitral regurgitation.  1. No significant coronary artery disease. 2. Normal LV systolic function with at least moderate mitral regurgitation by left ventricular angiography. 3. Mildly  elevated left ventricular end-diastolic pressure at 18 mmHg. 4. RA pressure was 7 mmHg and RV pressure was 30/ 2 mmHg. The Swan-Ganz catheter could not be advanced beyond the right ventricle due to significant tortuosity in the venous system from the right arm. Thus, cardiac output could not be calculated. RV systolic pressure is normal and thus the patient is unlikely to have significant pulmonary hypertension.  Recommendations: Proceed with surgical repair as planned. The patient does not require coronary revascularization. Avoid catheterization via the arm in the future due to severe radial artery spasm leading to significant pain and catheter entrapment.   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 Right heart catheterization was performed using a 5 French Swan-Ganz catheter. The patient was found to have tortuous venous system which required a glide wire to navigate. However, due to this tortuosity, I was not able to advance the Swan-Ganz catheter beyond the right ventricle.  The right wrist was prepped, draped, and anesthetized with 1% lidocaine. Using the modified Seldinger technique, a 5 French sheath was introduced into the right radial artery. 2.5 mg of verapamil was administered through the sheath, weight-based unfractionated heparin was administered intravenously.A Jackie catheter was used for selective coronary angiography. A pigtail catheter was used for left ventriculography. A JL 3.5 catheter was needed to engage the left main. Catheter exchanges were performed over an exchange length guidewire. The patient had severe right arm pain and right radial artery spasm throughout the procedure that made it extremely difficult to torque the catheters. This is in spite of generous  sedation and pain management. Kayla Hopkins was also given extra intra-arterial verapamil. There were no immediate procedural complications. A TR band was used for radial hemostasis at the completion of the procedure. The patient was transferred to the post catheterization recovery area for further monitoring.   Estimated blood loss <50 mL.  During this procedure the patient was administered the following to achieve and maintain moderate conscious sedation: Versed 3 mg, Fentanyl 200 mcg, while the patient's heart rate, blood pressure, and oxygen saturation were continuously monitored. The period of conscious sedation was 47 minutes, of which I was present face-to-face 100% of this time.    Coronary Findings   Dominance: Right  Left Main  Vessel is angiographically normal.  Left Anterior Descending  The vessel exhibits minimal luminal irregularities.  First Diagonal Branch  The vessel exhibits minimal luminal irregularities.  Second Diagonal Branch  Vessel is angiographically normal.  Third Diagonal Branch  Vessel is small in size. Vessel is angiographically normal.  Ramus Intermedius  Vessel is large. Vessel is angiographically normal.  Left Circumflex  Vessel is small.  First Obtuse Marginal Branch  Vessel is angiographically normal.  Second Obtuse Marginal Branch  Vessel is angiographically normal.  Right Coronary Artery  Mid RCA lesion, 20% stenosed.  Right Posterior Descending Artery  Vessel is angiographically normal.  Right Posterior Atrioventricular Branch  Vessel is angiographically normal.  First Right Posterolateral  Vessel is angiographically normal.  Second Right Posterolateral  Vessel is angiographically normal.  Third Right Posterolateral  Vessel is angiographically normal.  Wall Motion              Left Heart   Left Ventricle The left ventricular size is normal. The left ventricular systolic function is normal. LV end diastolic pressure is mildly elevated.  The left ventricular ejection fraction is 55-65% by visual estimate. No regional wall motion abnormalities. There is moderate (3+) mitral regurgitation.    Coronary Diagrams   Diagnostic Diagram     Implants        No implant documentation for this case.  PACS Images   Show images for Cardiac catheterization   Link to Procedure Log   Procedure Log    Hemo Data   Flowsheet Row Most Recent Value  RA A Wave -99 mmHg  RA V Wave 7 mmHg  RA Mean 6 mmHg  RV Systolic Pressure 30 mmHg  RV Diastolic Pressure 0 mmHg  RV EDP 8 mmHg  AO Systolic Pressure [id number] mmHg  AO Diastolic Pressure 79 mmHg  AO Mean [id number] mmHg  LV Systolic Pressure 123456 mmHg  LV Diastolic Pressure 6 mmHg  LV EDP 18 mmHg  Arterial Occlusion Pressure Extended Systolic Pressure A999333 mmHg  Arterial Occlusion Pressure Extended Diastolic Pressure 87 mmHg  Arterial Occlusion Pressure Extended Mean Pressure 122 mmHg  Left Ventricular Apex Extended Systolic Pressure 99991111 mmHg  Left  Ventricular Apex Extended Diastolic Pressure 13 mmHg  Left Ventricular Apex Extended EDP Pressure 22 mmHg      Impression:  Patient has a long history of atrial fibrillation dating back more than 5 years ago. Kayla Hopkins has remained in persistent atrial fibrillation for at least 9 months and has failed attempts at DC cardioversion on 2 occasions, most recently on oral flecainide. Kayla Hopkins describes worseningsymptoms of exertional shortness of breath, fatigue, orthopnea, PND, and lower extremity edema consistent with chronic diastolic congestive heart failure, New [place] Heart Association functional class III. The patient's long-standing symptoms of shortness of breath are likely further exacerbated by the presence of morbid obesity, obstructive sleep apnea, and history of long-standing tobacco abuse,although Kayla Hopkins was able toquit smoking last March.  Symptoms of exertional shortness of breath have improved slightly on optimal medical therapy with  recent addition of diuretics, but the patient still gets short of breath with relatively mild low level activity.  I have personally reviewed the patient's recent transesophageal echocardiogram and diagnostic cardiac catheterization. The patient has mild left ventricular hypertrophy with some degree of diastolic dysfunction but low normal left ventricular systolic function with ejection fraction estimated 55-60% in the setting of significantmitral regurgitation. There is smoke in the left atrium but no flow reversal in the pulmonary veins. There is severe left atrial enlargement, severe right atrial enlargement, and severe tricuspid regurgitation. The right ventricle was not dilated and there appeared to be normal right ventricular systolic function. There appears to be mild thickening of the mitral valve leaflets but essentially normal leaflet mobility with primarily type I mitral valve dysfunction and at least moderate mitral regurgitation. 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  Diagnostic cardiac catheterization performed yesterday demonstrates mild nonobstructive coronary artery disease and confirms the presence of at least moderate mitral regurgitation despite recent addition of oral diuretic therapy.  I agree that the likelihood of successful restoration of atrial fibrillation using catheter-based ablation would probably be relatively low. Options at this time include continued medical therapy for congestive heart failure versus mitral valve repair, tricuspid valve repair, and Maze procedure.  Given the patient's relatively young age, a more aggressive approach might be reasonable.    Plan:  I have again reviewed treatment options at length with the patient and Kayla Hopkins husband in the office today. We discussed the relative risks and benefits of continued medical  therapy with close observation versus proceeding with surgery for mitral valve repair, tricuspid valve repair, and Maze procedure.  The rationale for elective surgery has been explained, including a comparison between surgery and continued medical therapy with close follow-up.  The likelihood of successful and durable valve repair has been discussed with particular reference to the findings of their recent echocardiogram.  Based upon these findings and previous experience, I have quoted them a greater than 90 percent likelihood of successful valve repair.  In the unlikely event that their valve cannot be successfully repaired, we discussed the possibility of replacing the mitral valve using a mechanical prosthesis with the attendant need for long-term anticoagulation versus the alternative of replacing it using a bioprosthetic tissue valve with its potential for late structural valve deterioration and failure, depending upon the patient's longevity.  The patient specifically requests that if the mitral valve must be replaced that it be done using a mechanical prosthetic valve valve.   The relative risks and benefits of performing a maze procedure at the  time of their surgery was discussed at length, including the expected likelihood of long term freedom from recurrent symptomatic atrial fibrillation and/or atrial flutter.  The patient understands and accepts all potential risks of surgery including but not limited to risk of death, stroke or other neurologic complication, myocardial infarction, congestive heart failure, respiratory failure, renal failure, bleeding requiring transfusion and/or reexploration, arrhythmia, infection or other wound complications, pneumonia, pleural and/or pericardial effusion, pulmonary embolus, aortic dissection or other major vascular complication, or delayed complications related to valve repair or replacement including but not limited to structural valve deterioration and failure,  thrombosis, embolization, endocarditis, or paravalvular leak.   Alternative surgical approaches have been discussed including a comparison between conventional sternotomy and minimally-invasive techniques.  Because of the patient's morbid obesity and body habitus, Kayla Hopkins is not felt to be a very good candidate for mini thoracotomy approach.  We tentatively plan for surgery via conventional median sternotomy on Thursday, 12/10/2016. The patient has been instructed to stop taking Eliquis in anticipation of surgery. In addition, we have given Kayla Hopkins prescription for amiodarone.  All of Kayla Hopkins questions have been answered.      Valentina Gu. Roxy Manns, MD 12/03/2016 11:32 AM

## 2016-12-08 NOTE — Progress Notes (Signed)
Pre-op Cardiac Surgery  Carotid Findings:  Right - 1% to 39% ICA stenosis. Vertebral artery flow is antegrade. Left - 40% to 59% ICA stenosis. Vertebral arery flow is antegrade  Upper Extremity Right Left  Brachial Pressures 130 Triphasic 127 Triphasic  Radial Waveforms Triphasic Triphasic  Ulnar Waveforms Triphasic Triphasic  Palmar Arch (Allen's Test) Normal Normal   Findings:  Doppler waveforms remained normal bilaterally with both radial and ulnar compressions  [facility], RVS 12/09/2015, 12:55 PM

## 2016-12-09 LAB — HEMOGLOBIN A1C
HEMOGLOBIN A1C: 6.1 % — AB (ref 4.8–5.6)
MEAN PLASMA GLUCOSE: 128 mg/dL

## 2016-12-09 MED ORDER — DOPAMINE-DEXTROSE 3.2-5 MG/ML-% IV SOLN
0.0000 ug/kg/min | INTRAVENOUS | Status: DC
  Filled 2016-12-09: qty 250

## 2016-12-09 MED ORDER — VANCOMYCIN HCL 1000 MG IV SOLR
INTRAVENOUS | Status: AC
  Administered 2016-12-10: 1000 mL
  Filled 2016-12-09: qty 1000

## 2016-12-09 MED ORDER — NITROGLYCERIN IN D5W 200-5 MCG/ML-% IV SOLN
2.0000 ug/min | INTRAVENOUS | Status: AC
  Administered 2016-12-10: 5 ug/min via INTRAVENOUS
  Filled 2016-12-09: qty 250

## 2016-12-09 MED ORDER — DEXMEDETOMIDINE HCL IN NACL 400 MCG/100ML IV SOLN
0.1000 ug/kg/h | INTRAVENOUS | Status: DC
  Filled 2016-12-09: qty 100

## 2016-12-09 MED ORDER — POTASSIUM CHLORIDE 2 MEQ/ML IV SOLN
80.0000 meq | INTRAVENOUS | Status: DC
  Filled 2016-12-09: qty 40

## 2016-12-09 MED ORDER — PLASMA-LYTE 148 IV SOLN
INTRAVENOUS | Status: AC
  Administered 2016-12-10: 500 mL
  Filled 2016-12-09: qty 2.5

## 2016-12-09 MED ORDER — TRANEXAMIC ACID (OHS) PUMP PRIME SOLUTION
2.0000 mg/kg | INTRAVENOUS | Status: DC
  Filled 2016-12-09: qty 2.03

## 2016-12-09 MED ORDER — HEPARIN SODIUM (PORCINE) 1000 UNIT/ML IJ SOLN
INTRAMUSCULAR | Status: DC
  Filled 2016-12-09: qty 30

## 2016-12-09 MED ORDER — VANCOMYCIN HCL 10 G IV SOLR
1250.0000 mg | INTRAVENOUS | Status: AC
  Administered 2016-12-10: 1250 mg via INTRAVENOUS
  Filled 2016-12-09: qty 1250

## 2016-12-09 MED ORDER — TRANEXAMIC ACID (OHS) BOLUS VIA INFUSION
15.0000 mg/kg | INTRAVENOUS | Status: AC
  Administered 2016-12-10: 1521 mg via INTRAVENOUS
  Filled 2016-12-09: qty 1521

## 2016-12-09 MED ORDER — DEXTROSE 5 % IV SOLN
1.5000 g | INTRAVENOUS | Status: AC
  Administered 2016-12-10: 1.5 g via INTRAVENOUS
  Administered 2016-12-10: .75 g via INTRAVENOUS
  Filled 2016-12-09 (×2): qty 1.5

## 2016-12-09 MED ORDER — SODIUM CHLORIDE 0.9 % IV SOLN
INTRAVENOUS | Status: DC
  Filled 2016-12-09: qty 2.5

## 2016-12-09 MED ORDER — GLUTARALDEHYDE 0.625% SOAKING SOLUTION
TOPICAL | Status: DC | PRN
  Filled 2016-12-09: qty 50

## 2016-12-09 MED ORDER — TRANEXAMIC ACID 1000 MG/10ML IV SOLN
1.5000 mg/kg/h | INTRAVENOUS | Status: AC
  Administered 2016-12-10: 1.5 mg/kg/h via INTRAVENOUS
  Filled 2016-12-09: qty 25

## 2016-12-09 MED ORDER — MAGNESIUM SULFATE 50 % IJ SOLN
40.0000 meq | INTRAMUSCULAR | Status: DC
  Filled 2016-12-09: qty 10

## 2016-12-09 MED ORDER — DEXTROSE 5 % IV SOLN
750.0000 mg | INTRAVENOUS | Status: DC
  Filled 2016-12-09: qty 750

## 2016-12-09 MED ORDER — CHLORHEXIDINE GLUCONATE 0.12 % MT SOLN
15.0000 mL | Freq: Once | OROMUCOSAL | Status: AC
Start: 2016-12-10 — End: 2016-12-10
  Administered 2016-12-10: 15 mL via OROMUCOSAL
  Filled 2016-12-09: qty 15

## 2016-12-09 MED ORDER — EPINEPHRINE PF 1 MG/ML IJ SOLN
0.0000 ug/min | INTRAVENOUS | Status: DC
  Filled 2016-12-09: qty 4

## 2016-12-09 MED ORDER — PHENYLEPHRINE HCL 10 MG/ML IJ SOLN
30.0000 ug/min | INTRAMUSCULAR | Status: DC
  Filled 2016-12-09: qty 2

## 2016-12-09 MED ORDER — METOPROLOL TARTRATE 12.5 MG HALF TABLET
12.5000 mg | ORAL_TABLET | Freq: Once | ORAL | Status: DC
Start: 2016-12-10 — End: 2016-12-10

## 2016-12-10 ENCOUNTER — Inpatient Hospital Stay (HOSPITAL_COMMUNITY): Payer: Medicare HMO

## 2016-12-10 ENCOUNTER — Encounter (HOSPITAL_COMMUNITY)
Admission: RE | Disposition: A | Discharge status: Home / Self Care | Payer: Self-pay | Source: Ambulatory Visit | Attending: Thoracic Surgery (Cardiothoracic Vascular Surgery)

## 2016-12-10 ENCOUNTER — Encounter (HOSPITAL_COMMUNITY): Payer: Self-pay | Admitting: Certified Registered Nurse Anesthetist

## 2016-12-10 ENCOUNTER — Inpatient Hospital Stay (HOSPITAL_COMMUNITY)
Admission: RE | Admit: 2016-12-10 | Discharge: 2016-12-18 | DRG: 219 | Disposition: A | Discharge status: Home / Self Care | Payer: Medicare HMO | Source: Ambulatory Visit | Attending: Thoracic Surgery (Cardiothoracic Vascular Surgery) | Admitting: Thoracic Surgery (Cardiothoracic Vascular Surgery)

## 2016-12-10 ENCOUNTER — Inpatient Hospital Stay (HOSPITAL_COMMUNITY): Payer: Medicare HMO | Admitting: Anesthesiology

## 2016-12-10 DIAGNOSIS — M7989 Other specified soft tissue disorders: Secondary | ICD-10-CM | POA: Diagnosis present

## 2016-12-10 DIAGNOSIS — F4310 Post-traumatic stress disorder, unspecified: Secondary | ICD-10-CM | POA: Diagnosis present

## 2016-12-10 DIAGNOSIS — E785 Hyperlipidemia, unspecified: Secondary | ICD-10-CM | POA: Diagnosis present

## 2016-12-10 DIAGNOSIS — I340 Nonrheumatic mitral (valve) insufficiency: Secondary | ICD-10-CM | POA: Diagnosis present

## 2016-12-10 DIAGNOSIS — G4733 Obstructive sleep apnea (adult) (pediatric): Secondary | ICD-10-CM | POA: Diagnosis present

## 2016-12-10 DIAGNOSIS — T82856A Stenosis of peripheral vascular stent, initial encounter: Secondary | ICD-10-CM | POA: Diagnosis present

## 2016-12-10 DIAGNOSIS — Y838 Other surgical procedures as the cause of abnormal reaction of the patient, or of later complication, without mention of misadventure at the time of the procedure: Secondary | ICD-10-CM | POA: Diagnosis present

## 2016-12-10 DIAGNOSIS — I071 Rheumatic tricuspid insufficiency: Secondary | ICD-10-CM

## 2016-12-10 DIAGNOSIS — I272 Pulmonary hypertension, unspecified: Secondary | ICD-10-CM | POA: Diagnosis present

## 2016-12-10 DIAGNOSIS — E1151 Type 2 diabetes mellitus with diabetic peripheral angiopathy without gangrene: Secondary | ICD-10-CM | POA: Diagnosis not present

## 2016-12-10 DIAGNOSIS — Z8249 Family history of ischemic heart disease and other diseases of the circulatory system: Secondary | ICD-10-CM

## 2016-12-10 DIAGNOSIS — I361 Nonrheumatic tricuspid (valve) insufficiency: Secondary | ICD-10-CM | POA: Diagnosis not present

## 2016-12-10 DIAGNOSIS — M79672 Pain in left foot: Secondary | ICD-10-CM | POA: Diagnosis present

## 2016-12-10 DIAGNOSIS — Z8679 Personal history of other diseases of the circulatory system: Secondary | ICD-10-CM

## 2016-12-10 DIAGNOSIS — I10 Essential (primary) hypertension: Secondary | ICD-10-CM | POA: Diagnosis not present

## 2016-12-10 DIAGNOSIS — I4891 Unspecified atrial fibrillation: Secondary | ICD-10-CM

## 2016-12-10 DIAGNOSIS — I110 Hypertensive heart disease with heart failure: Secondary | ICD-10-CM | POA: Diagnosis present

## 2016-12-10 DIAGNOSIS — I480 Paroxysmal atrial fibrillation: Secondary | ICD-10-CM | POA: Diagnosis present

## 2016-12-10 DIAGNOSIS — F329 Major depressive disorder, single episode, unspecified: Secondary | ICD-10-CM | POA: Diagnosis present

## 2016-12-10 DIAGNOSIS — J9811 Atelectasis: Secondary | ICD-10-CM | POA: Diagnosis not present

## 2016-12-10 DIAGNOSIS — J811 Chronic pulmonary edema: Secondary | ICD-10-CM | POA: Diagnosis not present

## 2016-12-10 DIAGNOSIS — M549 Dorsalgia, unspecified: Secondary | ICD-10-CM | POA: Diagnosis present

## 2016-12-10 DIAGNOSIS — I081 Rheumatic disorders of both mitral and tricuspid valves: Principal | ICD-10-CM | POA: Diagnosis present

## 2016-12-10 DIAGNOSIS — I350 Nonrheumatic aortic (valve) stenosis: Secondary | ICD-10-CM | POA: Diagnosis not present

## 2016-12-10 DIAGNOSIS — Z87891 Personal history of nicotine dependence: Secondary | ICD-10-CM

## 2016-12-10 DIAGNOSIS — K219 Gastro-esophageal reflux disease without esophagitis: Secondary | ICD-10-CM | POA: Diagnosis present

## 2016-12-10 DIAGNOSIS — R0602 Shortness of breath: Secondary | ICD-10-CM | POA: Diagnosis not present

## 2016-12-10 DIAGNOSIS — D62 Acute posthemorrhagic anemia: Secondary | ICD-10-CM | POA: Diagnosis not present

## 2016-12-10 DIAGNOSIS — I481 Persistent atrial fibrillation: Secondary | ICD-10-CM | POA: Diagnosis present

## 2016-12-10 DIAGNOSIS — Z23 Encounter for immunization: Secondary | ICD-10-CM

## 2016-12-10 DIAGNOSIS — I5033 Acute on chronic diastolic (congestive) heart failure: Secondary | ICD-10-CM | POA: Diagnosis present

## 2016-12-10 DIAGNOSIS — Z7901 Long term (current) use of anticoagulants: Secondary | ICD-10-CM

## 2016-12-10 DIAGNOSIS — Z888 Allergy status to other drugs, medicaments and biological substances status: Secondary | ICD-10-CM

## 2016-12-10 DIAGNOSIS — E875 Hyperkalemia: Secondary | ICD-10-CM | POA: Diagnosis not present

## 2016-12-10 DIAGNOSIS — D696 Thrombocytopenia, unspecified: Secondary | ICD-10-CM | POA: Diagnosis not present

## 2016-12-10 DIAGNOSIS — I5032 Chronic diastolic (congestive) heart failure: Secondary | ICD-10-CM | POA: Diagnosis present

## 2016-12-10 DIAGNOSIS — E876 Hypokalemia: Secondary | ICD-10-CM | POA: Diagnosis present

## 2016-12-10 DIAGNOSIS — G8929 Other chronic pain: Secondary | ICD-10-CM | POA: Diagnosis present

## 2016-12-10 DIAGNOSIS — Z9989 Dependence on other enabling machines and devices: Secondary | ICD-10-CM

## 2016-12-10 DIAGNOSIS — Z4682 Encounter for fitting and adjustment of non-vascular catheter: Secondary | ICD-10-CM | POA: Diagnosis not present

## 2016-12-10 DIAGNOSIS — I4819 Other persistent atrial fibrillation: Secondary | ICD-10-CM | POA: Diagnosis present

## 2016-12-10 DIAGNOSIS — Z6837 Body mass index (BMI) 37.0-37.9, adult: Secondary | ICD-10-CM

## 2016-12-10 DIAGNOSIS — I2510 Atherosclerotic heart disease of native coronary artery without angina pectoris: Secondary | ICD-10-CM | POA: Diagnosis present

## 2016-12-10 DIAGNOSIS — M79671 Pain in right foot: Secondary | ICD-10-CM | POA: Diagnosis present

## 2016-12-10 DIAGNOSIS — Z9889 Other specified postprocedural states: Secondary | ICD-10-CM

## 2016-12-10 DIAGNOSIS — Z79899 Other long term (current) drug therapy: Secondary | ICD-10-CM

## 2016-12-10 HISTORY — PX: MITRAL VALVE REPAIR: SHX2039

## 2016-12-10 HISTORY — PX: TEE WITHOUT CARDIOVERSION: SHX5443

## 2016-12-10 HISTORY — PX: MAZE: SHX5063

## 2016-12-10 HISTORY — PX: TRICUSPID VALVE REPLACEMENT: SHX816

## 2016-12-10 LAB — POCT I-STAT, CHEM 8
BUN: 21 mg/dL — ABNORMAL HIGH (ref 6–20)
BUN: 18 mg/dL (ref 6–20)
BUN: 21 mg/dL — ABNORMAL HIGH (ref 6–20)
BUN: 20 mg/dL (ref 6–20)
BUN: 19 mg/dL (ref 6–20)
BUN: 18 mg/dL (ref 6–20)
BUN: 16 mg/dL (ref 6–20)
CALCIUM ION: 1.24 mmol/L (ref 1.15–1.40)
CALCIUM ION: 1.01 mmol/L — AB (ref 1.15–1.40)
CALCIUM ION: 1.23 mmol/L (ref 1.15–1.40)
CALCIUM ION: 1.08 mmol/L — AB (ref 1.15–1.40)
CALCIUM ION: 1.06 mmol/L — AB (ref 1.15–1.40)
CALCIUM ION: 1.08 mmol/L — AB (ref 1.15–1.40)
CHLORIDE: 100 mmol/L — AB (ref 101–111)
CHLORIDE: 102 mmol/L (ref 101–111)
CREATININE: 0.5 mg/dL (ref 0.44–1.00)
CREATININE: 0.7 mg/dL (ref 0.44–1.00)
Calcium, Ion: 1.19 mmol/L (ref 1.15–1.40)
Chloride: 101 mmol/L (ref 101–111)
Chloride: 103 mmol/L (ref 101–111)
Chloride: 104 mmol/L (ref 101–111)
Chloride: 104 mmol/L (ref 101–111)
Chloride: 110 mmol/L (ref 101–111)
Creatinine, Ser: 0.7 mg/dL (ref 0.44–1.00)
Creatinine, Ser: 0.6 mg/dL (ref 0.44–1.00)
Creatinine, Ser: 0.6 mg/dL (ref 0.44–1.00)
Creatinine, Ser: 0.8 mg/dL (ref 0.44–1.00)
Creatinine, Ser: 0.6 mg/dL (ref 0.44–1.00)
GLUCOSE: 135 mg/dL — AB (ref 65–99)
GLUCOSE: 113 mg/dL — AB (ref 65–99)
GLUCOSE: 173 mg/dL — AB (ref 65–99)
GLUCOSE: 157 mg/dL — AB (ref 65–99)
Glucose, Bld: 129 mg/dL — ABNORMAL HIGH (ref 65–99)
Glucose, Bld: 178 mg/dL — ABNORMAL HIGH (ref 65–99)
Glucose, Bld: 126 mg/dL — ABNORMAL HIGH (ref 65–99)
HCT: 32 % — ABNORMAL LOW (ref 36.0–46.0)
HCT: 26 % — ABNORMAL LOW (ref 36.0–46.0)
HCT: 26 % — ABNORMAL LOW (ref 36.0–46.0)
HCT: 25 % — ABNORMAL LOW (ref 36.0–46.0)
HCT: 27 % — ABNORMAL LOW (ref 36.0–46.0)
HEMATOCRIT: 24 % — AB (ref 36.0–46.0)
HEMATOCRIT: 33 % — AB (ref 36.0–46.0)
HEMOGLOBIN: 10.9 g/dL — AB (ref 12.0–15.0)
HEMOGLOBIN: 8.5 g/dL — AB (ref 12.0–15.0)
HEMOGLOBIN: 9.2 g/dL — AB (ref 12.0–15.0)
Hemoglobin: 11.2 g/dL — ABNORMAL LOW (ref 12.0–15.0)
Hemoglobin: 8.2 g/dL — ABNORMAL LOW (ref 12.0–15.0)
Hemoglobin: 8.8 g/dL — ABNORMAL LOW (ref 12.0–15.0)
Hemoglobin: 8.8 g/dL — ABNORMAL LOW (ref 12.0–15.0)
POTASSIUM: 3.2 mmol/L — AB (ref 3.5–5.1)
Potassium: 3.6 mmol/L (ref 3.5–5.1)
Potassium: 3.5 mmol/L (ref 3.5–5.1)
Potassium: 4.2 mmol/L (ref 3.5–5.1)
Potassium: 4.1 mmol/L (ref 3.5–5.1)
Potassium: 3.7 mmol/L (ref 3.5–5.1)
Potassium: 4.3 mmol/L (ref 3.5–5.1)
SODIUM: 140 mmol/L (ref 135–145)
SODIUM: 139 mmol/L (ref 135–145)
SODIUM: 139 mmol/L (ref 135–145)
SODIUM: 142 mmol/L (ref 135–145)
Sodium: 140 mmol/L (ref 135–145)
Sodium: 137 mmol/L (ref 135–145)
Sodium: 141 mmol/L (ref 135–145)
TCO2: 28 mmol/L (ref 0–100)
TCO2: 28 mmol/L (ref 0–100)
TCO2: 29 mmol/L (ref 0–100)
TCO2: 29 mmol/L (ref 0–100)
TCO2: 26 mmol/L (ref 0–100)
TCO2: 23 mmol/L (ref 0–100)
TCO2: 22 mmol/L (ref 0–100)

## 2016-12-10 LAB — PROTIME-INR
INR: 1.39
INR: 1.32
PROTHROMBIN TIME: 16.4 s — AB (ref 11.4–15.2)
Prothrombin Time: 17.1 seconds — ABNORMAL HIGH (ref 11.4–15.2)

## 2016-12-10 LAB — CBC
HCT: 25 % — ABNORMAL LOW (ref 36.0–46.0)
HEMATOCRIT: 33.8 % — AB (ref 36.0–46.0)
HEMATOCRIT: 28.8 % — AB (ref 36.0–46.0)
HEMOGLOBIN: 10.8 g/dL — AB (ref 12.0–15.0)
Hemoglobin: 8.2 g/dL — ABNORMAL LOW (ref 12.0–15.0)
Hemoglobin: 9.4 g/dL — ABNORMAL LOW (ref 12.0–15.0)
MCH: 28.2 pg (ref 26.0–34.0)
MCH: 29 pg (ref 26.0–34.0)
MCH: 28.7 pg (ref 26.0–34.0)
MCHC: 32 g/dL (ref 30.0–36.0)
MCHC: 32.8 g/dL (ref 30.0–36.0)
MCHC: 32.6 g/dL (ref 30.0–36.0)
MCV: 88.3 fL (ref 78.0–100.0)
MCV: 88.3 fL (ref 78.0–100.0)
MCV: 87.8 fL (ref 78.0–100.0)
PLATELETS: 138 10*3/uL — AB (ref 150–400)
Platelets: 155 10*3/uL (ref 150–400)
Platelets: 123 10*3/uL — ABNORMAL LOW (ref 150–400)
RBC: 3.83 MIL/uL — AB (ref 3.87–5.11)
RBC: 2.83 MIL/uL — ABNORMAL LOW (ref 3.87–5.11)
RBC: 3.28 MIL/uL — ABNORMAL LOW (ref 3.87–5.11)
RDW: 14 % (ref 11.5–15.5)
RDW: 14 % (ref 11.5–15.5)
RDW: 13.9 % (ref 11.5–15.5)
WBC: 12.1 10*3/uL — ABNORMAL HIGH (ref 4.0–10.5)
WBC: 11.5 10*3/uL — AB (ref 4.0–10.5)
WBC: 11.5 10*3/uL — AB (ref 4.0–10.5)

## 2016-12-10 LAB — CREATININE, SERUM
Creatinine, Ser: 0.77 mg/dL (ref 0.44–1.00)
GFR calc non Af Amer: >60 mL/min (ref >60)

## 2016-12-10 LAB — GLUCOSE, CAPILLARY
GLUCOSE-CAPILLARY: 123 mg/dL — AB (ref 65–99)
Glucose-Capillary: 96 mg/dL (ref 65–99)
Glucose-Capillary: 121 mg/dL — ABNORMAL HIGH (ref 65–99)
Glucose-Capillary: 115 mg/dL — ABNORMAL HIGH (ref 65–99)
Glucose-Capillary: 156 mg/dL — ABNORMAL HIGH (ref 65–99)
Glucose-Capillary: 135 mg/dL — ABNORMAL HIGH (ref 65–99)

## 2016-12-10 LAB — POCT I-STAT 3, ART BLOOD GAS (G3+)
ACID-BASE DEFICIT: 3 mmol/L — AB (ref 0.0–2.0)
Acid-Base Excess: 3 mmol/L — ABNORMAL HIGH (ref 0.0–2.0)
Acid-base deficit: 4 mmol/L — ABNORMAL HIGH (ref 0.0–2.0)
Acid-base deficit: 1 mmol/L (ref 0.0–2.0)
Acid-base deficit: 5 mmol/L — ABNORMAL HIGH (ref 0.0–2.0)
BICARBONATE: 23.3 mmol/L (ref 20.0–28.0)
Bicarbonate: 27.3 mmol/L (ref 20.0–28.0)
Bicarbonate: 22 mmol/L (ref 20.0–28.0)
Bicarbonate: 21.5 mmol/L (ref 20.0–28.0)
Bicarbonate: 20.5 mmol/L (ref 20.0–28.0)
O2 SAT: 100 %
O2 Saturation: 99 %
O2 Saturation: 91 %
O2 Saturation: 87 %
O2 Saturation: 96 %
PCO2 ART: 38 mmHg (ref 32.0–48.0)
PCO2 ART: 39.8 mmHg (ref 32.0–48.0)
PCO2 ART: 38.1 mmHg (ref 32.0–48.0)
PH ART: 7.336 — AB (ref 7.350–7.450)
PH ART: 7.431 (ref 7.350–7.450)
PO2 ART: 49 mmHg — AB (ref 83.0–108.0)
PO2 ART: 84 mmHg (ref 83.0–108.0)
Patient temperature: 36
Patient temperature: 36.3
Patient temperature: 36.6
TCO2: 28 mmol/L (ref 0–100)
TCO2: 23 mmol/L (ref 0–100)
TCO2: 23 mmol/L (ref 0–100)
TCO2: 24 mmol/L (ref 0–100)
TCO2: 22 mmol/L (ref 0–100)
pCO2 arterial: 40.6 mmHg (ref 32.0–48.0)
pCO2 arterial: 34.9 mmHg (ref 32.0–48.0)
pH, Arterial: 7.464 — ABNORMAL HIGH (ref 7.350–7.450)
pH, Arterial: 7.343 — ABNORMAL LOW (ref 7.350–7.450)
pH, Arterial: 7.337 — ABNORMAL LOW (ref 7.350–7.450)
pO2, Arterial: 378 mmHg — ABNORMAL HIGH (ref 83.0–108.0)
pO2, Arterial: 122 mmHg — ABNORMAL HIGH (ref 83.0–108.0)
pO2, Arterial: 62 mmHg — ABNORMAL LOW (ref 83.0–108.0)

## 2016-12-10 LAB — POCT I-STAT 4, (NA,K, GLUC, HGB,HCT)
GLUCOSE: 144 mg/dL — AB (ref 65–99)
Glucose, Bld: 101 mg/dL — ABNORMAL HIGH (ref 65–99)
HCT: 31 % — ABNORMAL LOW (ref 36.0–46.0)
HCT: 23 % — ABNORMAL LOW (ref 36.0–46.0)
HEMOGLOBIN: 7.8 g/dL — AB (ref 12.0–15.0)
Hemoglobin: 10.5 g/dL — ABNORMAL LOW (ref 12.0–15.0)
POTASSIUM: 5 mmol/L (ref 3.5–5.1)
Potassium: 3.8 mmol/L (ref 3.5–5.1)
Sodium: 142 mmol/L (ref 135–145)
Sodium: 142 mmol/L (ref 135–145)

## 2016-12-10 LAB — PLATELET COUNT: PLATELETS: 148 10*3/uL — AB (ref 150–400)

## 2016-12-10 LAB — HEMOGLOBIN AND HEMATOCRIT, BLOOD
HEMATOCRIT: 26.4 % — AB (ref 36.0–46.0)
Hemoglobin: 8.5 g/dL — ABNORMAL LOW (ref 12.0–15.0)

## 2016-12-10 LAB — APTT
aPTT: 34 seconds (ref 24–36)
aPTT: 30 seconds (ref 24–36)

## 2016-12-10 LAB — ECHO TEE
LA diam end sys: 5.6 mm
Nyquist: 38.5 m/s
PISA EROA: 0.24 cm2
Radius: 0.7 cm

## 2016-12-10 LAB — MAGNESIUM: MAGNESIUM: 2.7 mg/dL — AB (ref 1.7–2.4)

## 2016-12-10 LAB — PREPARE RBC (CROSSMATCH)

## 2016-12-10 SURGERY — REPAIR, MITRAL VALVE
Anesthesia: General | Site: Chest

## 2016-12-10 MED ORDER — DOCUSATE SODIUM 100 MG PO CAPS
200.0000 mg | ORAL_CAPSULE | Freq: Every day | ORAL | Status: DC
  Administered 2016-12-11 – 2016-12-18 (×7): 200 mg via ORAL
  Filled 2016-12-10 (×8): qty 2

## 2016-12-10 MED ORDER — DEXMEDETOMIDINE HCL IN NACL 200 MCG/50ML IV SOLN
0.0000 ug/kg/h | INTRAVENOUS | Status: DC
  Administered 2016-12-10 (×2): 0.5 ug/kg/h via INTRAVENOUS
  Administered 2016-12-11: 0.7 ug/kg/h via INTRAVENOUS
  Filled 2016-12-10 (×3): qty 50

## 2016-12-10 MED ORDER — METOPROLOL TARTRATE 5 MG/5ML IV SOLN
2.5000 mg | INTRAVENOUS | Status: DC | PRN
  Administered 2016-12-12 (×2): 5 mg via INTRAVENOUS
  Filled 2016-12-10 (×2): qty 5

## 2016-12-10 MED ORDER — MAGNESIUM SULFATE 4 GM/100ML IV SOLN
4.0000 g | Freq: Once | INTRAVENOUS | Status: AC
  Administered 2016-12-10: 4 g via INTRAVENOUS
  Filled 2016-12-10: qty 100

## 2016-12-10 MED ORDER — ALBUMIN HUMAN 5 % IV SOLN
INTRAVENOUS | Status: DC | PRN
  Administered 2016-12-10: 13:00:00 via INTRAVENOUS

## 2016-12-10 MED ORDER — SODIUM CHLORIDE 0.9 % IV SOLN: 250.0000 mL | INTRAVENOUS | Status: DC

## 2016-12-10 MED ORDER — SODIUM CHLORIDE 0.9 % IV SOLN
INTRAVENOUS | Status: DC
  Administered 2016-12-10: 15:00:00 via INTRAVENOUS

## 2016-12-10 MED ORDER — LACTATED RINGERS IV SOLN
INTRAVENOUS | Status: DC
  Administered 2016-12-10: 15:00:00 via INTRAVENOUS

## 2016-12-10 MED ORDER — HEPARIN SODIUM (PORCINE) 1000 UNIT/ML IJ SOLN
INTRAMUSCULAR | Status: AC
  Filled 2016-12-10: qty 1

## 2016-12-10 MED ORDER — ALBUMIN HUMAN 5 % IV SOLN
250.0000 mL | INTRAVENOUS | Status: AC | PRN
  Administered 2016-12-10 (×3): 250 mL via INTRAVENOUS
  Filled 2016-12-10: qty 250

## 2016-12-10 MED ORDER — MIDAZOLAM HCL 2 MG/2ML IJ SOLN
2.0000 mg | INTRAMUSCULAR | Status: DC | PRN
  Administered 2016-12-10 (×2): 2 mg via INTRAVENOUS
  Filled 2016-12-10 (×3): qty 2

## 2016-12-10 MED ORDER — SODIUM CHLORIDE 0.9% FLUSH
3.0000 mL | Freq: Two times a day (BID) | INTRAVENOUS | Status: DC
  Administered 2016-12-11 – 2016-12-17 (×9): 3 mL via INTRAVENOUS

## 2016-12-10 MED ORDER — BISACODYL 10 MG RE SUPP
10.0000 mg | Freq: Every day | RECTAL | Status: DC
  Filled 2016-12-10: qty 1

## 2016-12-10 MED ORDER — INSULIN REGULAR BOLUS VIA INFUSION
0.0000 [IU] | Freq: Three times a day (TID) | INTRAVENOUS | Status: DC
  Filled 2016-12-10: qty 10

## 2016-12-10 MED ORDER — SODIUM CHLORIDE 0.9 % IV SOLN
30.0000 meq | Freq: Once | INTRAVENOUS | Status: AC
  Administered 2016-12-10: 30 meq via INTRAVENOUS
  Filled 2016-12-10: qty 15

## 2016-12-10 MED ORDER — LACTATED RINGERS IV SOLN
INTRAVENOUS | Status: DC | PRN
  Administered 2016-12-10: 10:00:00 via INTRAVENOUS

## 2016-12-10 MED ORDER — SODIUM CHLORIDE 0.9% FLUSH
3.0000 mL | INTRAVENOUS | Status: DC | PRN
  Administered 2016-12-13 – 2016-12-14 (×2): 3 mL via INTRAVENOUS
  Filled 2016-12-10 (×2): qty 3

## 2016-12-10 MED ORDER — SODIUM CHLORIDE 0.9 % IV SOLN
INTRAVENOUS | Status: DC | PRN
  Administered 2016-12-10: 0.2 ug/kg/h via INTRAVENOUS

## 2016-12-10 MED ORDER — MILRINONE LACTATE IN DEXTROSE 20-5 MG/100ML-% IV SOLN
0.1250 ug/kg/min | INTRAVENOUS | Status: AC
  Administered 2016-12-10: .3 ug/kg/min via INTRAVENOUS
  Filled 2016-12-10: qty 100

## 2016-12-10 MED ORDER — PROTAMINE SULFATE 10 MG/ML IV SOLN
INTRAVENOUS | Status: DC | PRN
  Administered 2016-12-10 (×2): 50 mg via INTRAVENOUS
  Administered 2016-12-10: 30 mg via INTRAVENOUS
  Administered 2016-12-10 (×3): 40 mg via INTRAVENOUS

## 2016-12-10 MED ORDER — MORPHINE SULFATE (PF) 2 MG/ML IV SOLN
1.0000 mg | INTRAVENOUS | Status: DC | PRN
  Administered 2016-12-10 – 2016-12-11 (×3): 2 mg via INTRAVENOUS
  Filled 2016-12-10 (×6): qty 1

## 2016-12-10 MED ORDER — ASPIRIN EC 325 MG PO TBEC: 325.0000 mg | DELAYED_RELEASE_TABLET | Freq: Every day | ORAL | Status: DC

## 2016-12-10 MED ORDER — ROCURONIUM BROMIDE 100 MG/10ML IV SOLN
INTRAVENOUS | Status: DC | PRN
  Administered 2016-12-10: 50 mg via INTRAVENOUS
  Administered 2016-12-10: 30 mg via INTRAVENOUS
  Administered 2016-12-10: 50 mg via INTRAVENOUS

## 2016-12-10 MED ORDER — MIDAZOLAM HCL 10 MG/2ML IJ SOLN
INTRAMUSCULAR | Status: AC
  Filled 2016-12-10: qty 2

## 2016-12-10 MED ORDER — FENTANYL CITRATE (PF) 250 MCG/5ML IJ SOLN
INTRAMUSCULAR | Status: DC | PRN
Start: 2016-12-10 — End: 2016-12-10
  Administered 2016-12-10: 50 ug via INTRAVENOUS
  Administered 2016-12-10: 400 ug via INTRAVENOUS
  Administered 2016-12-10: 250 ug via INTRAVENOUS
  Administered 2016-12-10 (×2): 100 ug via INTRAVENOUS
  Administered 2016-12-10: 150 ug via INTRAVENOUS
  Administered 2016-12-10: 50 ug via INTRAVENOUS
  Administered 2016-12-10: 150 ug via INTRAVENOUS

## 2016-12-10 MED ORDER — PROPOFOL 10 MG/ML IV BOLUS
INTRAVENOUS | Status: AC
Start: 2016-12-10 — End: 2016-12-10
  Filled 2016-12-10: qty 40

## 2016-12-10 MED ORDER — PROPOFOL 10 MG/ML IV BOLUS
INTRAVENOUS | Status: DC | PRN
  Administered 2016-12-10: 70 mg via INTRAVENOUS

## 2016-12-10 MED ORDER — ACETAMINOPHEN 500 MG PO TABS
1000.0000 mg | ORAL_TABLET | Freq: Four times a day (QID) | ORAL | Status: AC
  Administered 2016-12-11 – 2016-12-15 (×17): 1000 mg via ORAL
  Filled 2016-12-10 (×15): qty 2

## 2016-12-10 MED ORDER — TRAMADOL HCL 50 MG PO TABS
50.0000 mg | ORAL_TABLET | ORAL | Status: DC | PRN
  Administered 2016-12-11 (×3): 50 mg via ORAL
  Administered 2016-12-12 – 2016-12-14 (×5): 100 mg via ORAL
  Filled 2016-12-10: qty 2
  Filled 2016-12-10: qty 1
  Filled 2016-12-10: qty 2
  Filled 2016-12-10: qty 1
  Filled 2016-12-10 (×3): qty 2
  Filled 2016-12-10: qty 1

## 2016-12-10 MED ORDER — MORPHINE SULFATE (PF) 2 MG/ML IV SOLN
1.0000 mg | INTRAVENOUS | Status: DC | PRN
  Administered 2016-12-10: 1 mg via INTRAVENOUS
  Administered 2016-12-10 – 2016-12-11 (×7): 2 mg via INTRAVENOUS
  Filled 2016-12-10 (×5): qty 1

## 2016-12-10 MED ORDER — SODIUM CHLORIDE 0.9 % IV SOLN
0.0000 ug/min | INTRAVENOUS | Status: DC
  Administered 2016-12-11: 50 ug/min via INTRAVENOUS
  Filled 2016-12-10 (×3): qty 2

## 2016-12-10 MED ORDER — OXYCODONE HCL 5 MG PO TABS
5.0000 mg | ORAL_TABLET | ORAL | Status: DC | PRN
  Administered 2016-12-11: 5 mg via ORAL
  Administered 2016-12-12: 10 mg via ORAL
  Administered 2016-12-12: 5 mg via ORAL
  Administered 2016-12-12 – 2016-12-14 (×8): 10 mg via ORAL
  Administered 2016-12-14: 5 mg via ORAL
  Administered 2016-12-15 – 2016-12-16 (×7): 10 mg via ORAL
  Administered 2016-12-17: 5 mg via ORAL
  Administered 2016-12-17 – 2016-12-18 (×6): 10 mg via ORAL
  Filled 2016-12-10 (×5): qty 2
  Filled 2016-12-10: qty 1
  Filled 2016-12-10 (×10): qty 2
  Filled 2016-12-10: qty 1
  Filled 2016-12-10 (×5): qty 2
  Filled 2016-12-10: qty 1
  Filled 2016-12-10 (×3): qty 2

## 2016-12-10 MED ORDER — SODIUM CHLORIDE 0.9 % IJ SOLN
OROMUCOSAL | Status: DC | PRN
  Administered 2016-12-10 (×3): 4 mL via TOPICAL

## 2016-12-10 MED ORDER — VANCOMYCIN HCL IN DEXTROSE 1-5 GM/200ML-% IV SOLN
1000.0000 mg | Freq: Once | INTRAVENOUS | Status: AC
  Administered 2016-12-10: 1000 mg via INTRAVENOUS
  Filled 2016-12-10: qty 200

## 2016-12-10 MED ORDER — SODIUM CHLORIDE 0.9 % IR SOLN
Status: DC | PRN
  Administered 2016-12-10: 5000 mL

## 2016-12-10 MED ORDER — FENTANYL CITRATE (PF) 250 MCG/5ML IJ SOLN
INTRAMUSCULAR | Status: AC
  Filled 2016-12-10: qty 20

## 2016-12-10 MED ORDER — CHLORHEXIDINE GLUCONATE 0.12% ORAL RINSE (MEDLINE KIT)
15.0000 mL | Freq: Two times a day (BID) | OROMUCOSAL | Status: DC
  Administered 2016-12-10: 15 mL via OROMUCOSAL

## 2016-12-10 MED ORDER — LIDOCAINE 2% (20 MG/ML) 5 ML SYRINGE
INTRAMUSCULAR | Status: AC
  Filled 2016-12-10: qty 5

## 2016-12-10 MED ORDER — ACETAMINOPHEN 160 MG/5ML PO SOLN
1000.0000 mg | Freq: Four times a day (QID) | ORAL | Status: DC
  Administered 2016-12-11: 1000 mg
  Filled 2016-12-10: qty 40.6

## 2016-12-10 MED ORDER — SODIUM CHLORIDE 0.45 % IV SOLN
INTRAVENOUS | Status: DC | PRN
  Administered 2016-12-10: 15:00:00 via INTRAVENOUS

## 2016-12-10 MED ORDER — DEXTROSE 5 % IV SOLN
1.5000 g | Freq: Two times a day (BID) | INTRAVENOUS | Status: AC
  Administered 2016-12-10 – 2016-12-12 (×4): 1.5 g via INTRAVENOUS
  Filled 2016-12-10 (×4): qty 1.5

## 2016-12-10 MED ORDER — LACTATED RINGERS IV SOLN
INTRAVENOUS | Status: DC | PRN
  Administered 2016-12-10 (×2): via INTRAVENOUS

## 2016-12-10 MED ORDER — ACETAMINOPHEN 160 MG/5ML PO SOLN: 650.0000 mg | Freq: Once | ORAL | Status: AC

## 2016-12-10 MED ORDER — TRANEXAMIC ACID 1000 MG/10ML IV SOLN
1000.0000 mg | INTRAVENOUS | Status: DC
  Filled 2016-12-10: qty 10

## 2016-12-10 MED ORDER — ASPIRIN 81 MG PO CHEW: 324.0000 mg | CHEWABLE_TABLET | Freq: Every day | ORAL | Status: DC

## 2016-12-10 MED ORDER — MIDAZOLAM HCL 5 MG/5ML IJ SOLN
INTRAMUSCULAR | Status: DC | PRN
  Administered 2016-12-10: 2 mg via INTRAVENOUS
  Administered 2016-12-10: 3 mg via INTRAVENOUS
  Administered 2016-12-10 (×2): 1 mg via INTRAVENOUS

## 2016-12-10 MED ORDER — LACTATED RINGERS IV SOLN: INTRAVENOUS | Status: DC

## 2016-12-10 MED ORDER — FAMOTIDINE IN NACL 20-0.9 MG/50ML-% IV SOLN
20.0000 mg | Freq: Two times a day (BID) | INTRAVENOUS | Status: AC
  Administered 2016-12-10 – 2016-12-11 (×2): 20 mg via INTRAVENOUS
  Filled 2016-12-10: qty 50

## 2016-12-10 MED ORDER — PHENYLEPHRINE HCL 10 MG/ML IJ SOLN
INTRAVENOUS | Status: DC | PRN
  Administered 2016-12-10: 10 ug/min via INTRAVENOUS

## 2016-12-10 MED ORDER — ROCURONIUM BROMIDE 50 MG/5ML IV SOSY
PREFILLED_SYRINGE | INTRAVENOUS | Status: AC
  Filled 2016-12-10: qty 15

## 2016-12-10 MED ORDER — PANTOPRAZOLE SODIUM 40 MG PO TBEC
40.0000 mg | DELAYED_RELEASE_TABLET | Freq: Every day | ORAL | Status: DC
  Administered 2016-12-12 – 2016-12-18 (×7): 40 mg via ORAL
  Filled 2016-12-10 (×8): qty 1

## 2016-12-10 MED ORDER — ORAL CARE MOUTH RINSE
15.0000 mL | OROMUCOSAL | Status: DC
  Administered 2016-12-11 (×2): 15 mL via OROMUCOSAL

## 2016-12-10 MED ORDER — NITROGLYCERIN IN D5W 200-5 MCG/ML-% IV SOLN: 0.0000 ug/min | INTRAVENOUS | Status: DC

## 2016-12-10 MED ORDER — CHLORHEXIDINE GLUCONATE 0.12 % MT SOLN
15.0000 mL | OROMUCOSAL | Status: AC
  Administered 2016-12-10: 15 mL via OROMUCOSAL

## 2016-12-10 MED ORDER — SODIUM CHLORIDE 0.9 % IV SOLN: INTRAVENOUS | Status: DC

## 2016-12-10 MED ORDER — BISACODYL 5 MG PO TBEC
10.0000 mg | DELAYED_RELEASE_TABLET | Freq: Every day | ORAL | Status: DC
  Administered 2016-12-11 – 2016-12-18 (×6): 10 mg via ORAL
  Filled 2016-12-10 (×8): qty 2

## 2016-12-10 MED ORDER — ONDANSETRON HCL 4 MG/2ML IJ SOLN: 4.0000 mg | Freq: Four times a day (QID) | INTRAMUSCULAR | Status: DC | PRN

## 2016-12-10 MED ORDER — LACTATED RINGERS IV SOLN
INTRAVENOUS | Status: DC | PRN
  Administered 2016-12-10: 07:00:00 via INTRAVENOUS

## 2016-12-10 MED ORDER — PROTAMINE SULFATE 10 MG/ML IV SOLN
INTRAVENOUS | Status: AC
  Filled 2016-12-10: qty 25

## 2016-12-10 MED ORDER — SODIUM CHLORIDE 0.9 % IV SOLN
INTRAVENOUS | Status: DC
  Administered 2016-12-10: 3.1 [IU]/h via INTRAVENOUS
  Filled 2016-12-10 (×2): qty 2.5

## 2016-12-10 MED ORDER — SODIUM CHLORIDE 0.9 % IV SOLN
INTRAVENOUS | Status: DC | PRN
  Administered 2016-12-10: 1 [IU]/h via INTRAVENOUS

## 2016-12-10 MED ORDER — ACETAMINOPHEN 650 MG RE SUPP
650.0000 mg | Freq: Once | RECTAL | Status: AC
  Administered 2016-12-10: 650 mg via RECTAL

## 2016-12-10 MED ORDER — LIDOCAINE HCL (CARDIAC) 20 MG/ML IV SOLN
INTRAVENOUS | Status: DC | PRN
  Administered 2016-12-10: 60 mg via INTRAVENOUS

## 2016-12-10 MED ORDER — SODIUM CHLORIDE 0.9 % IV SOLN
Freq: Once | INTRAVENOUS | Status: AC
  Administered 2016-12-10: 20:00:00 via INTRAVENOUS

## 2016-12-10 MED ORDER — LACTATED RINGERS IV SOLN: 500.0000 mL | Freq: Once | INTRAVENOUS | Status: DC | PRN

## 2016-12-10 MED ORDER — MILRINONE LACTATE IN DEXTROSE 20-5 MG/100ML-% IV SOLN
0.2000 ug/kg/min | INTRAVENOUS | Status: DC
  Administered 2016-12-10: 0.3 ug/kg/min via INTRAVENOUS
  Administered 2016-12-11 – 2016-12-12 (×2): 0.2 ug/kg/min via INTRAVENOUS
  Filled 2016-12-10 (×3): qty 100

## 2016-12-10 MED ORDER — FENTANYL CITRATE (PF) 250 MCG/5ML IJ SOLN
INTRAMUSCULAR | Status: AC
  Filled 2016-12-10: qty 5

## 2016-12-10 MED ORDER — HEPARIN SODIUM (PORCINE) 1000 UNIT/ML IJ SOLN
INTRAMUSCULAR | Status: DC | PRN
  Administered 2016-12-10: 28 mL via INTRAVENOUS

## 2016-12-10 MED FILL — Potassium Chloride Inj 2 mEq/ML: INTRAVENOUS | Qty: 40 | Status: AC

## 2016-12-10 MED FILL — Heparin Sodium (Porcine) Inj 1000 Unit/ML: INTRAMUSCULAR | Qty: 30 | Status: AC

## 2016-12-10 MED FILL — Magnesium Sulfate Inj 50%: INTRAMUSCULAR | Qty: 10 | Status: AC

## 2016-12-10 SURGICAL SUPPLY — 116 items
ADAPTER CARDIO PERF ANTE/RETRO (ADAPTER) ×6 IMPLANT
APPLICATOR COTTON TIP 6IN STRL (MISCELLANEOUS) IMPLANT
ARTICLIP LAA PROCLIP II 40 (Clip) ×3 IMPLANT
ATTRACTOMAT 16X20 MAGNETIC DRP (DRAPES) ×6 IMPLANT
BAG DECANTER FOR FLEXI CONT (MISCELLANEOUS) ×9 IMPLANT
BLADE STERNUM SYSTEM 6 (BLADE) ×6 IMPLANT
BLADE SURG 11 STRL SS (BLADE) ×6 IMPLANT
CANISTER SUCTION 2500CC (MISCELLANEOUS) ×6 IMPLANT
CANN PRFSN 3/8X14X24FR PCFC (MISCELLANEOUS)
CANN PRFSN 3/8XCNCT ST RT ANG (MISCELLANEOUS)
CANNULA EZ GLIDE AORTIC 21FR (CANNULA) ×6 IMPLANT
CANNULA FEM VENOUS REMOTE 22FR (CANNULA) ×3 IMPLANT
CANNULA GUNDRY RCSP 15FR (MISCELLANEOUS) ×3 IMPLANT
CANNULA PRFSN 3/8X14X24FR PCFC (MISCELLANEOUS) IMPLANT
CANNULA PRFSN 3/8XCNCT RT ANG (MISCELLANEOUS) IMPLANT
CANNULA VEN MTL TIP RT (MISCELLANEOUS)
CANNULA VENNOUS METAL TIP 20FR (CANNULA) ×3 IMPLANT
CARDIOBLATE CARDIAC ABLATION (MISCELLANEOUS)
CATH FOLEY 2WAY SLVR  5CC 14FR (CATHETERS)
CATH FOLEY 2WAY SLVR 5CC 14FR (CATHETERS) IMPLANT
CATH ROBINSON RED A/P 18FR (CATHETERS) ×3 IMPLANT
CATH THORACIC 28FR RT ANG (CATHETERS) IMPLANT
CATH THORACIC 36FR (CATHETERS) IMPLANT
CLAMP ISOLATOR SYNERGY LG (MISCELLANEOUS) ×3 IMPLANT
CLAMP OLL ABLATION (MISCELLANEOUS) ×3 IMPLANT
CLIP FOGARTY SPRING 6M (CLIP) IMPLANT
CONN 1/2X1/2X1/2  BEN (MISCELLANEOUS) ×2
CONN 1/2X1/2X1/2 BEN (MISCELLANEOUS) ×4 IMPLANT
CONN 3/8X1/2 ST GISH (MISCELLANEOUS) ×12 IMPLANT
CONN ST 1/4X3/8  BEN (MISCELLANEOUS) ×2
CONN ST 1/4X3/8 BEN (MISCELLANEOUS) ×4 IMPLANT
COVER PROBE W GEL 5X96 (DRAPES) ×3 IMPLANT
COVER SURGICAL LIGHT HANDLE (MISCELLANEOUS) ×12 IMPLANT
CRADLE DONUT ADULT HEAD (MISCELLANEOUS) ×6 IMPLANT
DERMABOND ADVANCED (GAUZE/BANDAGES/DRESSINGS) ×1
DERMABOND ADVANCED .7 DNX12 (GAUZE/BANDAGES/DRESSINGS) ×2 IMPLANT
DEVICE ATRICLIP LAA PRCLPII 40 (Clip) ×2 IMPLANT
DEVICE CARDIOBLATE CARDIAC ABL (MISCELLANEOUS) IMPLANT
DEVICE SUT CK QUICK LOAD MINI (Prosthesis & Implant Heart) ×12 IMPLANT
DRAIN CHANNEL 32F RND 10.7 FF (WOUND CARE) ×6 IMPLANT
DRAPE BILATERAL SPLIT (DRAPES) IMPLANT
DRAPE CARDIOVASCULAR INCISE (DRAPES) ×1
DRAPE CV SPLIT W-CLR ANES SCRN (DRAPES) IMPLANT
DRAPE INCISE IOBAN 66X45 STRL (DRAPES) ×3 IMPLANT
DRAPE SLUSH/WARMER DISC (DRAPES) ×3 IMPLANT
DRAPE SRG 135X102X78XABS (DRAPES) ×2 IMPLANT
DRSG COVADERM 4X14 (GAUZE/BANDAGES/DRESSINGS) ×3 IMPLANT
ELECT REM PT RETURN 9FT ADLT (ELECTROSURGICAL) ×12
ELECTRODE REM PT RTRN 9FT ADLT (ELECTROSURGICAL) ×8 IMPLANT
FELT TEFLON 1X6 (MISCELLANEOUS) ×12 IMPLANT
GAUZE SPONGE 4X4 12PLY STRL (GAUZE/BANDAGES/DRESSINGS) ×12 IMPLANT
GLOVE BIO SURGEON STRL SZ 6 (GLOVE) IMPLANT
GLOVE BIO SURGEON STRL SZ 6.5 (GLOVE) ×18 IMPLANT
GLOVE BIO SURGEON STRL SZ7 (GLOVE) IMPLANT
GLOVE BIO SURGEON STRL SZ7.5 (GLOVE) IMPLANT
GLOVE ORTHO TXT STRL SZ7.5 (GLOVE) ×9 IMPLANT
GOWN STRL REUS W/ TWL LRG LVL3 (GOWN DISPOSABLE) ×16 IMPLANT
GOWN STRL REUS W/TWL LRG LVL3 (GOWN DISPOSABLE) ×8
HEMOSTAT POWDER SURGIFOAM 1G (HEMOSTASIS) ×15 IMPLANT
INSERT FOGARTY XLG (MISCELLANEOUS) ×3 IMPLANT
IV NS IRRIG 3000ML ARTHROMATIC (IV SOLUTION) ×3 IMPLANT
KIT BASIN OR (CUSTOM PROCEDURE TRAY) ×6 IMPLANT
KIT DILATOR VASC 18G NDL (KITS) ×6 IMPLANT
KIT DRAINAGE VACCUM ASSIST (KITS) ×3 IMPLANT
KIT ROOM TURNOVER OR (KITS) ×6 IMPLANT
KIT SUCTION CATH 14FR (SUCTIONS) ×18 IMPLANT
KIT SUT CK MINI COMBO 4X17 (Prosthesis & Implant Heart) ×3 IMPLANT
LINE VENT (MISCELLANEOUS) ×3 IMPLANT
LOOP VESSEL SUPERMAXI WHITE (MISCELLANEOUS) ×3 IMPLANT
MARKER GRAFT CORONARY BYPASS (MISCELLANEOUS) IMPLANT
NS IRRIG 1000ML POUR BTL (IV SOLUTION) ×33 IMPLANT
PACK OPEN HEART (CUSTOM PROCEDURE TRAY) ×6 IMPLANT
PAD ARMBOARD 7.5X6 YLW CONV (MISCELLANEOUS) ×12 IMPLANT
PROBE CRYO2-ABLATION MALLABLE (MISCELLANEOUS) ×3 IMPLANT
RING ANNULOFLEX SZ 26 (Ring) ×3 IMPLANT
RING TRICUSPID T26 (Prosthesis & Implant Heart) ×3 IMPLANT
SET CARDIOPLEGIA MPS 5001102 (MISCELLANEOUS) ×3 IMPLANT
SET IRRIG TUBING LAPAROSCOPIC (IRRIGATION / IRRIGATOR) ×6 IMPLANT
SPONGE GAUZE 4X4 12PLY STER LF (GAUZE/BANDAGES/DRESSINGS) ×3 IMPLANT
SUCKER INTRACARDIAC WEIGHTED (SUCKER) ×6 IMPLANT
SUT BONE WAX W31G (SUTURE) ×3 IMPLANT
SUT ETHIBOND (SUTURE) ×12 IMPLANT
SUT ETHIBOND 2 0 SH (SUTURE) ×7 IMPLANT
SUT ETHIBOND 2 0 SH 36X2 (SUTURE) ×8 IMPLANT
SUT ETHIBOND 2 0 V4 (SUTURE) IMPLANT
SUT ETHIBOND 2 0V4 GREEN (SUTURE) IMPLANT
SUT ETHIBOND 2-0 RB-1 WHT (SUTURE) ×15 IMPLANT
SUT ETHIBOND 4 0 TF (SUTURE) IMPLANT
SUT ETHIBOND 5 0 C 1 30 (SUTURE) ×6 IMPLANT
SUT ETHIBOND X763 2 0 SH 1 (SUTURE) ×6 IMPLANT
SUT MNCRL AB 3-0 PS2 18 (SUTURE) ×6 IMPLANT
SUT PDS AB 1 CTX 36 (SUTURE) ×6 IMPLANT
SUT PROLENE 3 0 SH 1 (SUTURE) ×6 IMPLANT
SUT PROLENE 3 0 SH DA (SUTURE) ×6 IMPLANT
SUT PROLENE 3 0 SH1 36 (SUTURE) ×21 IMPLANT
SUT PROLENE 4 0 RB 1 (SUTURE) ×4
SUT PROLENE 4 0 SH DA (SUTURE) ×12 IMPLANT
SUT PROLENE 4-0 RB1 .5 CRCL 36 (SUTURE) ×8 IMPLANT
SUT SILK  1 MH (SUTURE) ×3
SUT SILK 1 MH (SUTURE) ×6 IMPLANT
SUT STEEL 6MS V (SUTURE) ×3 IMPLANT
SUT STEEL STERNAL CCS#1 18IN (SUTURE) IMPLANT
SUT STEEL SZ 6 DBL 3X14 BALL (SUTURE) ×9 IMPLANT
SUT VIC AB 1 CTX 18 (SUTURE) ×3 IMPLANT
SUT VIC AB 2-0 CTX 27 (SUTURE) IMPLANT
SYSTEM SAHARA CHEST DRAIN ATS (WOUND CARE) ×6 IMPLANT
TAPE CLOTH SURG 4X10 WHT LF (GAUZE/BANDAGES/DRESSINGS) ×3 IMPLANT
TAPE PAPER 2X10 WHT MICROPORE (GAUZE/BANDAGES/DRESSINGS) ×3 IMPLANT
TOWEL OR 17X24 6PK STRL BLUE (TOWEL DISPOSABLE) ×9 IMPLANT
TOWEL OR 17X26 10 PK STRL BLUE (TOWEL DISPOSABLE) ×9 IMPLANT
TRAY FOLEY CATH 16FRSI W/METER (SET/KITS/TRAYS/PACK) ×3 IMPLANT
TRAY FOLEY IC TEMP SENS 14FR (CATHETERS) ×3 IMPLANT
TRAY FOLEY IC TEMP SENS 16FR (CATHETERS) ×3 IMPLANT
TUBING INSUFFLATION 10FT LAP (TUBING) ×6 IMPLANT
UNDERPAD 30X30 (UNDERPADS AND DIAPERS) ×6 IMPLANT
WATER STERILE IRR 1000ML POUR (IV SOLUTION) ×12 IMPLANT

## 2016-12-10 NOTE — Interval H&P Note (Signed)
History and Physical Interval Note:  12/10/2016 7:06 AM  Kayla Hopkins  has presented today for surgery, with the diagnosis of MR TR AFIB  The various methods of treatment have been discussed with the patient and family. After consideration of risks, benefits and other options for treatment, the patient has consented to  Procedure(s): MITRAL VALVE REPAIR (MVR) (N/A) TRICUSPID VALVE REPAIR (N/A) MAZE (N/A) TRANSESOPHAGEAL ECHOCARDIOGRAM (TEE) (N/A) as a surgical intervention .  The patient's history has been reviewed, patient examined, no change in status, stable for surgery.  I have reviewed the patient's chart and labs.  Questions were answered to the patient's satisfaction.     [name]

## 2016-12-10 NOTE — Transfer of Care (Signed)
Immediate Anesthesia Transfer of Care Note  Patient: BARNETTE LOFGREEN  Procedure(s) Performed: Procedure(s): MITRAL VALVE REPAIR (MVR) (N/A) TRICUSPID VALVE REPAIR (N/A) MAZE (N/A) TRANSESOPHAGEAL ECHOCARDIOGRAM (TEE) (N/A)  Patient Location: SICU  Anesthesia Type:General  Level of Consciousness: Patient remains intubated per anesthesia plan  Airway & Oxygen Therapy: Patient remains intubated per anesthesia plan and Patient placed on Ventilator (see vital sign flow sheet for setting)  Post-op Assessment: Report given to RN and Post -op Vital signs reviewed and stable  Post vital signs: Reviewed and stable  Last Vitals:  Vitals:   12/10/16 0550 12/10/16 1438  BP: (!) 144/74 129/66  Pulse: 63 80  Resp: 18 13  Temp: 36.8 C     Last Pain:  Vitals:   12/10/16 0550  TempSrc: Oral      Patients Stated Pain Goal: 2 (0000000 [id number])  Complications: No apparent anesthesia complications

## 2016-12-10 NOTE — Progress Notes (Signed)
1800- Pt started bleeding profusely from femoral cannulation site. Immediate pressure was placed on site. H&H was drawn via istat hgb was 7.5. Dr. Roxan Hockey present in room. Per MD hold manual pressure for 30 minutes and then place a femstop. After holding manual pressure for 30 minutes femstop placed. Updated Dr. Roxy Manns and was directed to get a CBC. Awaiting results and will call MD with updated hgb. Pts vital stable. Will continue to monitor closely.

## 2016-12-10 NOTE — Op Note (Signed)
CARDIOTHORACIC SURGERY OPERATIVE NOTE  Date of Procedure:  12/10/2016  Preoperative Diagnosis:   Moderate Mitral Regurgitation  Severe Tricuspid Regurgitation  Longstanding Persistent Atrial Fibrillation  Postoperative Diagnosis: Same  Procedure:   Mitral Valve Repair  Sorin Carbomedics Annuloflex posterior annuloplasty band (size 33mm, ref #[id number], serial [id number])   Tricuspid Valve Repair  Edwards mc3 ring annuloplasty (size 58mm, model #4900, serial [id number])   Maze Procedure   complete bilateral atrial lesion set using bipolar radiofrequency and cryothermy ablation  clipping of left atrial appendage (Atriclip size 6mm)   Surgeon: Valentina Gu. Roxy Manns, MD  Assistant: John Giovanni, PA-C and Brigid Re, PA-S  Anesthesia: Roberts Gaudy, MD  Operative Findings:  Mild sclerosis of the mitral valve with normal leaflet mobility, relatively small leaflets, and annular dilatation  Type I mitral valve dysfunction with moderate (3+) mitral regurgitation  Normal LV systolic function (EF [id number])  Moderate pulmonary hypertension  Type I tricuspid valve dysfunction with severe (4+) tricuspid regurgitation  No residual mitral regurgitation after posterior annuloplasty band  No residual tricuspid regurgitation after ring annuloplasty                       BRIEF CLINICAL NOTE AND INDICATIONS FOR SURGERY  Patient is a 56 year old morbidly obese female with history of persistent atrial fibrillation, hypertension, obstructive sleep apnea, peripheral arterial disease, post traumatic stress disorder, previous history of long-standing tobacco abuse, and recently discovered mitral regurgitation and tricuspid regurgitation who has been referred for surgical consultation to discuss treatment options for management of atrial fibrillation and symptoms of congestive heart failure. The patient's history of atrial fibrillation dates back more than 5 years ago when she first  presented with symptoms of palpitations and was diagnosed with paroxysmal atrial fibrillation. She was started on Pradaxa and followed for several years by Dr. Acie Fredrickson. In 2010 she underwent bilateral common iliac artery stent placement by Dr. Trula Slade for bilateral hip claudication. She initially did well but later developed recurrent claudication and was referred to Dr. Fletcher Anon. 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 She has been followed intermittently ever since by Dr. Fletcher Anon. In November 2016 the patient underwent hysterectomy. She developed atypical chest pain and worsening shortness breath and was later found to be in persistent atrialfibrillation. An echocardiogram was performed demonstrating ejection fraction estimated 55-60% with dilated left atrium and what was felt to be mild mitral regurgitation. Stress Myoview exam was felt to be low risk for ischemia. Systemic anticoagulation was resumed using Eliquis and medications were added for rate control. Patient developed worsening shortness of breath, PND, orthopnea, and lower extremity edema. She was seen in follow-up by Dr. Fletcher Anon and underwent an attempted cardioversion that was unsuccessful. She was referred to the atrial fibrillation clinic. She underwent a sleep study that confirmed the presence of obstructive sleep apnea and was started on CPAP. She noted some improvement in her ability to sleep, but she continued to experience worsening shortness of breath, palpitations and episodes of PND. She was started on flecainide and underwent a second attempt at cardioversion which failed. She was seen in follow-up in the atrial fibrillation clinic by Dr. Rayann Heman discussed the possibility of catheter-based ablation. TEE was performed 10/13/2016 revealed the presence of moderate mitral regurgitation and severe tricuspid regurgitation. Plans for ablation were canceled and  the patient was referred for surgical consultation.  She was originally seen in consultation on 10/26/2016. Since then she  has been seen in follow-up by Ignacia Bayley at [facility] who noted that the patient had acute exacerbation of chronic diastolic congestive heart failure and prescribed lasix. In addition the patient was seen in consultation by Dr. Lawana Chambers who performed dental extraction and cleared the patient for surgery. More recently the patient was seen in follow up byDr. Fletcher Anon who performed left and right heart catheterization yesterday. The patient was found to have mild nonobstructive coronary artery disease with normal left ventricular systolic function. The patient was noted to have at least moderate mitral regurgitation with mildly elevated left ventricular end-diastolic pressure. The patient returns to the office today to discuss treatment options further. She reports that she is feeling a little better since she was started on Lasix, but she still gets short of breath with very mild low level activity. She has not had any chest pain or chest tightness. She reports no new problems or complaints.  The patient has been seen in consultation and counseled at length regarding the indications, risks and potential benefits of surgery.  All questions have been answered, and the patient provides full informed consent for the operation as described.    DETAILS OF THE OPERATIVE PROCEDURE  Preparation:  The patient is brought to the operating room on the above mentioned date and central monitoring was established by the anesthesia team including placement of Swan-Ganz catheter and radial arterial line. The patient had moderate pulmonary hypertension at baseline.  The patient is placed in the supine position on the operating table.  Intravenous antibiotics are administered. General endotracheal anesthesia is induced uneventfully. A Foley catheter is placed.  Baseline transesophageal  echocardiogram was performed.  Findings were notable for normal left ventricular size and systolic function. Ejection fraction was estimated 55%. There was dilatation of the mitral annulus. The mitral valve leaflets appeared to move normally. There was a broad central jet of mitral regurgitation that filled most of the left atrium. Quantification of the severity of mitral regurgitation was consistent with moderate (3+) mitral regurgitation. There was left atrial enlargement. There was mild right ventricular enlargement with normal right ventricular systolic function. There was severe (4+) tricuspid regurgitation. The tricuspid leaflets appeared normal and moved normally. Aortic valve appeared essentially normal.  The patient's chest, abdomen, both groins, and both lower extremities are prepared and draped in a sterile manner. A time out procedure is performed.   Surgical Approach:  A median sternotomy incision was performed and the pericardium is opened. The ascending aorta is normal in appearance.    Extracorporeal Cardiopulmonary Bypass and Myocardial Protection:  The right common femoral vein is cannulated using the Seldinger technique and a guidewire advanced into the right atrium using TEE guidance.  The patient is heparinized systemically and the femoral vein cannulated using a 22 Fr long femoral venous cannula.  The ascending aorta is cannulated for cardiopulmonary bypass.  Adequate heparinization is verified.   A retrograde cardioplegia cannula is placed through the right atrium into the coronary sinus.   The entire pre-bypass portion of the operation was notable for stable hemodynamics.  Cardiopulmonary bypass was begun and the surface of the heart is inspected.  A second venous cannula is placed directly into the superior vena cava.   A cardioplegia cannula is placed in the ascending aorta.  A temperature probe was placed in the interventricular septum.  The patient is cooled to 32C  systemic temperature.  The aortic cross clamp is applied and cold blood cardioplegia is delivered initially in an antegrade  fashion through the aortic root.   Supplemental cardioplegia is given retrograde through the coronary sinus catheter.  Iced saline slush is applied for topical hypothermia.  The initial cardioplegic arrest is rapid with early diastolic arrest.  Repeat doses of cardioplegia are administered intermittently throughout the entire cross clamp portion of the operation through the aortic root and through the coronary sinus catheter in order to maintain completely flat electrocardiogram and septal myocardial temperature below 15C.  Myocardial protection was felt to be excellent.   Maze Procedure (left atrial lesion set):  The AtriCure Synergy bipolar radiofrequency ablation clamp is used for all radiofrequency ablation lesions for the maze procedure.  The Atricure CryoICE nitrous oxide cryothermy system is utilized for all cryothermy ablation lesions.   The heart is retracted towards the surgeon's side and the left sided pulmonary veins exposed.  An elliptical ablation lesion is created around the base of the left sided pulmonary veins.  A similar elliptical lesion was created around the base of the left atrial appendage.  The left atrial appendage was obliterated using an Atricure left atrial appendage clip (Atriclip, size 52mm).  The heart was replaced into the pericardial sac.  A left atriotomy incision was performed through the interatrial groove and extended partially across the back wall of the left atrium after opening the oblique sinus inferiorly.  The floor of the left atrium and the mitral valve were exposed using a self-retaining retractor.    An ablation lesion was placed around the right sided pulmonary veins using the bipolar clamp with one limb of the clamp along the endocardial surface and one along the epicardial surface posteriorly.  A bipolar ablation lesion was placed  across the dome of the left atrium from the cephalad apex of the atriotomy incision to reach the cephalad apex of the elliptical lesion around the left sided pulmonary veins.  A similar bipolar lesion was placed across the back wall of the left atrium from the caudad apex of the atriotomy incision to reach the caudad apex of the elliptical lesion around the left sided pulmonary veins, thereby completing a box.  Finally another bipolar lesion was placed across the back wall of the left atrium from the caudad apex of the atriotomy incision towards the posterior mitral valve annulus.  This lesion was completed along the endocardial surface onto the posterior mitral annulus with a 3 minute duration cryothermy lesion, followed by a second cryothermy lesion along the posterior epicardial surface of the left atrium across the coronary sinus.   This completes the entire left side lesion set of the Cox maze procedure.   Mitral Valve Repair:  The mitral valve was inspected and notable for mild sclerosis of the mitral valve leaflets. There was no mitral valve prolapse. There was no significant calcification. The overall size of the anterior leaflet was relatively small.  There was annular dilatation.  Interrupted 2-0 Ethibond horizontal mattress sutures were placed circumferentially around the entire mitral annulus.  These sutures will ultimately be utilized for ring annuloplasty, and at this juncture they are utilized to suspend the valve symmetrically.  The valve was tested with saline and appeared reasonably competent even without ring annuloplasty complete. The valve was sized to a 26 mm annuloplasty ring, based upon the transverse distance between the left and right commissures and the height of the anterior leaflet, corresponding to a size just slightly larger than the overall surface area of the anterior leaflet.  Because of the mild sclerosis of the mitral valve leaflets  and the relatively small size of the  anterior leaflet, it was postulated that use of a 26 mm complete semi-rigid annuloplasty ring might cause significant mitral stenosis.  Therefore a decision is made to utilize a posterior annuloplasty band rather than a complete ring. A Sorin Carbomedics Annuloflex annuloplasty ring (size 51mm, ref #[id number], serial [id number]) was secured in place uneventfully. The portion of the band at the base of the anterior leaflet was excised leaving only a posterior annuloplasty band.   The valve was tested with saline and appeared competent. The atriotomy was closed using a 2-layer closure of running 3-0 Prolene suture after placing a sump drain across the mitral valve to serve as a left ventricular vent.     Maze Procedure (right atrial lesion set):  The inferior vena cava cannula was pulled down until the tip was just below the junction between the right atrium and the inferior vena cava. An oblique incision is made in the right atrium. Traction sutures are placed to facilitate exposure of the tricuspid valve.  The AtriCure Synergy bipolar radiofrequency ablation clamp is utilized to create a series of linear lesions in the right atrium, each with one limb of the clamp along the endocardial surface and the other along the epicardial surface. The first lesion is placed from the posterior apex of the atriotomy incision and along the lateral wall of the right atrium to reach the lateral aspect of the superior vena cava. A second lesion is placed in the opposite direction from the posterior apex of the atriotomy incision along the lateral wall to reach the lateral aspect of the inferior vena cava. A third lesion is placed from the midportion of the atriotomy incision extending at a right angle to reach the tip of the right atrial appendage. A fourth lesion is placed from the anterior apex of the atriotomy incision in an anterior and inferior direction to reach the acute margin of the heart. Finally, the cryotherapy probe  is utilized to complete the right atrial lesion set by placing the probe along the endocardial surface of the right atrium from the anterior apex of the atriotomy incision to reach the tricuspid annulus at the 2:00 position.   Tricuspid Valve Repair:  The tricuspid valve is inspected carefully.  The tricuspid valve leaflets appear normal with normal mobility and no sign of any fibrosis or thickening.  Tricuspid ring annuloplasty is performed using interrupted 2-0 Ethibond horizontal mattress sutures placed circumferentially around the tricuspid annulus with exception of the area immediately below the triangle of Koch.   After placement of all of the annuloplasty sutures a single dose of warm retrograde "hot shot" blood cardioplegia was given and the aortic cross clamp removed after a total cross clamp duration of 123 minutes.  The tricuspid valve was sized to accept a 33mm annuloplasty ring based upon the overall surface area of the combined anterior and posterior leaflets. An Edwards [facility] 3 annuloplasty ring (size 26 mm, model #4900, serial [id number]) is implanted uneventfully. After completion of the annuloplasty the valve was tested with saline and appears to be competent. The right atriotomy incision is closed using a 2 layer closure of running 4-0 Prolene suture.   Procedure Completion:  Epicardial pacing wires are fixed to the right ventricular outflow tract and to the right atrial appendage. The patient is rewarmed to 37C temperature. The aortic and left ventricular vents are removed.  The patient is weaned and disconnected from cardiopulmonary bypass.  The patient's rhythm at separation from  bypass was AV paced.  The patient was weaned from cardiopulmonary bypass on low dose milrinone. Total cardiopulmonary bypass time for the operation was 173 minutes.  Followup transesophageal echocardiogram performed after separation from bypass revealed a well-seated annuloplasty ring in the mitral  position.  The valve was was functioning normally and without any residual mitral regurgitation. Mean transvalvular gradient across the mitral valve was estimated 5 mmHg. Left ventricular function was unchanged from preoperatively.  There was a well-seated annuloplasty ring in the tricuspid position. There was no residual tricuspid regurgitation.  The aortic and superior vena cava cannula were removed uneventfully. Protamine was administered to reverse the anticoagulation. The femoral venous cannula was removed and manual pressure held on the groin for 30 minutes.  The mediastinum and pleural space were inspected for hemostasis and irrigated with saline solution. The mediastinum and both pleural spaces were drained using 4 chest tubes placed through separate stab incisions inferiorly.  The soft tissues anterior to the aorta were reapproximated loosely. The sternum is closed with double strength sternal wire. The soft tissues anterior to the sternum were closed in multiple layers and the skin is closed with a running subcuticular skin closure.   The post-bypass portion of the operation was notable for stable rhythm and hemodynamics.   No blood products were administered during the operation.   Patient Disposition:  The patient tolerated the procedure well and is transported to the surgical intensive care in stable condition. There are no intraoperative complications. All sponge instrument and needle counts are verified correct at completion of the operation.     Valentina Gu. Roxy Manns MD 12/10/2016 2:13 PM

## 2016-12-10 NOTE — Progress Notes (Signed)
  Echocardiogram 2D Echocardiogram has been performed.  Kayla Hopkins L Androw 12/10/2016, 9:12 AM

## 2016-12-10 NOTE — Brief Op Note (Signed)
12/10/2016  12:39 PM  PATIENT:  Kayla Hopkins  56 y.o. female  PRE-OPERATIVE DIAGNOSIS:  MR TR AFIB  POST-OPERATIVE DIAGNOSIS:  MR TR AFIB  PROCEDURE:  Procedure(s): MITRAL VALVE REPAIR (MVR) (N/A) TRICUSPID VALVE REPAIR (N/A) MAZE (N/A) TRANSESOPHAGEAL ECHOCARDIOGRAM (TEE) (N/A)  SURGEON:    [name], MD  ASSISTANTS:  John Giovanni, PA-C and Brigid Re, PA-S  ANESTHESIA:   Roberts Gaudy, MD  CROSSCLAMP TIME:   52'  CARDIOPULMONARY BYPASS TIME: 173'  FINDINGS:  Mild sclerosis of the mitral valve with normal leaflet mobility, relatively small leaflets, and annular dilatation  Type I mitral valve dysfunction with moderate (3+) mitral regurgitation  Normal LV systolic function (EF [id number])  Moderate pulmonary hypertension  Type I tricuspid valve dysfunction with severe (4+) tricuspid regurgitation  No residual mitral regurgitation after posterior annuloplasty band  No residual tricuspid regurgitation after ring annuloplasty  Maze Procedure  Surgical Approach: Median sternotomy  Cut-and-sew:  No.  Cryo: Yes  Cryo Lesions (select all that apply):       6  Mitral Valve Cryo Lesion,     10  Tricuspid Cryo Lesion, 16  Other - epicardial posterior AV groove and coronary sinus  Radiofrequency:  Yes.  Bipolar: Yes.  RF Lesions (select all that apply):      1   Pulmonary Vein Isolation,    2   Box Lesion,   3a  Inferior Pulmonary Vein Connecting Lesion,   3b  Superior Pulmonary Vein Connecting Lesion,     4  Posterior Mirtal Annular Line,    11  Intercaval Line,   15a  RAA Lateral Wall (Short) and   15b  RAA Lateral Wall to "T" Lesion    Left Atrial Appendage Treatment:    Yes -  epicardial clip    COMPLICATIONS: None  BASELINE WEIGHT: 101  PATIENT DISPOSITION:   TO SICU IN STABLE CONDITION  [name], MD 12/10/2016 2:02 PM

## 2016-12-10 NOTE — Progress Notes (Signed)
Pt unable to do NIF and VC at this time. VC < 0.3 with very poor effort. Pt spo2 dropped to 75%. Pt placed back on full support (SIMV/PRVC 570, R 12, +5, Fio2 60%) due to Low PaO2 on ABG. RN at bedside and in agreement. RT will continue to closely monitor pt.

## 2016-12-10 NOTE — Anesthesia Preprocedure Evaluation (Addendum)
Anesthesia Evaluation  Patient identified by MRN, date of birth, ID band Patient awake    Reviewed: Allergy & Precautions, NPO status , Patient's Chart, lab work & pertinent test results  Airway Mallampati: II  TM Distance: >3 FB Neck ROM: Full    Dental  (+) Teeth Intact   Pulmonary former smoker,     + decreased breath sounds      Cardiovascular hypertension,  Rhythm:Irregular Rate:Normal     Neuro/Psych    GI/Hepatic   Endo/Other    Renal/GU      Musculoskeletal   Abdominal (+) + obese,   Peds  Hematology   Anesthesia Other Findings   Reproductive/Obstetrics                            Anesthesia Physical Anesthesia Plan  ASA: III  Anesthesia Plan: General   Post-op Pain Management:    Induction: Intravenous  Airway Management Planned: Oral ETT  Additional Equipment: Arterial line, CVP, 3D TEE, PA Cath and Ultrasound Guidance Line Placement  Intra-op Plan:   Post-operative Plan: Post-operative intubation/ventilation  Informed Consent: I have reviewed the patients History and Physical, chart, labs and discussed the procedure including the risks, benefits and alternatives for the proposed anesthesia with the patient or authorized representative who has indicated his/her understanding and acceptance.     Plan Discussed with: CRNA and Anesthesiologist  Anesthesia Plan Comments:         Anesthesia Quick Evaluation

## 2016-12-10 NOTE — Progress Notes (Signed)
Hendrickson called unit, updated on pt's condition. No immediate concerns at this time, giving 1unit pRBC per Dr. Roxy Manns.  Will continue to monitor.

## 2016-12-10 NOTE — Progress Notes (Signed)
Initiated Rapid Wean per [facility] protocol at this time. Pt stable, in no distress, VS within normal limits. RN at bedside. RT will continue to monitor.

## 2016-12-10 NOTE — OR Nursing (Signed)
13:15 - 45 minute call to SICU charge nurse

## 2016-12-10 NOTE — Progress Notes (Signed)
      [address]       [place] [address]             [phone]      PM Rounds  S/p MV/TV repair, maze  BP 97/60   Pulse 80   Temp 97.7 F (36.5 C)   Resp 19   Ht 5\' 6"  (1.676 m)   Wt 223 lb 9 oz (101.4 kg)   LMP 11/09/2008   SpO2 92%   BMI 36.08 kg/m   47/31 CI= 2.0  Intake/Output Summary (Last 24 hours) at 12/10/16 1739 Last data filed at 12/10/16 1700  Gross per 24 hour  Intake          4764.39 ml  Output             3185 ml  Net          1579.39 ml   Hct= 31, K= 3.8  Doing well early postop  Weaning vent at present  Mitchell. Roxan Hockey, MD [facility] [phone]

## 2016-12-10 NOTE — OR Nursing (Signed)
14:55pm - 20 minute call to SICU

## 2016-12-10 NOTE — Progress Notes (Addendum)
Respiratory notified readiness to decrease FiO2.

## 2016-12-10 NOTE — Anesthesia Procedure Notes (Signed)
Procedure Name: Intubation Date/Time: 12/10/2016 8:20 AM Performed by: Ollen Bowl Pre-anesthesia Checklist: Patient identified, Emergency Drugs available, Suction available, Patient being monitored and Timeout performed Patient Re-evaluated:Patient Re-evaluated prior to inductionOxygen Delivery Method: Circle system utilized and Simple face mask Preoxygenation: Pre-oxygenation with 100% oxygen Intubation Type: IV induction Ventilation: Mask ventilation without difficulty and Oral airway inserted - appropriate to patient size Laryngoscope Size: Sabra Heck and 2 Grade View: Grade I Tube type: Subglottic suction tube Tube size: 7.5 mm Number of attempts: 1 Airway Equipment and Method: Patient positioned with wedge pillow and Stylet Placement Confirmation: ETT inserted through vocal cords under direct vision,  positive ETCO2 and breath sounds checked- equal and bilateral Secured at: 21 cm Tube secured with: Tape Dental Injury: Teeth and Oropharynx as per pre-operative assessment

## 2016-12-10 NOTE — Progress Notes (Signed)
  Echocardiogram Echocardiogram Transesophageal has been performed.  Kayla Hopkins 12/10/2016, 11:58 AM

## 2016-12-10 NOTE — Anesthesia Procedure Notes (Addendum)
Central Venous Catheter Insertion Performed by: Roberts Gaudy, anesthesiologist Start/End2/11/2016 7:05 AM, 12/10/2016 7:15 AM Patient location: Pre-op. Preanesthetic checklist: patient identified, IV checked, site marked, risks and benefits discussed, surgical consent, monitors and equipment checked, pre-op evaluation and timeout performed Position: Trendelenburg Lidocaine 1% used for infiltration and patient sedated Hand hygiene performed , maximum sterile barriers used  and Seldinger technique used Catheter size: 8.5 Fr MAC introducer Swan type:thermodilution Procedure performed using ultrasound guided technique. Ultrasound Notes:anatomy identified, needle tip was noted to be adjacent to the nerve/plexus identified, no ultrasound evidence of intravascular and/or intraneural injection and image(s) printed for medical record Attempts: 1 Following insertion, line sutured, dressing applied and Biopatch. Post procedure assessment: blood return through all ports, free fluid flow and no air  Patient tolerated the procedure well with no immediate complications.

## 2016-12-11 ENCOUNTER — Inpatient Hospital Stay (HOSPITAL_COMMUNITY): Payer: Medicare HMO

## 2016-12-11 ENCOUNTER — Encounter (HOSPITAL_COMMUNITY): Payer: Self-pay | Admitting: Thoracic Surgery (Cardiothoracic Vascular Surgery)

## 2016-12-11 LAB — POCT I-STAT 3, ART BLOOD GAS (G3+)
ACID-BASE DEFICIT: 6 mmol/L — AB (ref 0.0–2.0)
Acid-base deficit: 2 mmol/L (ref 0.0–2.0)
BICARBONATE: 19.9 mmol/L — AB (ref 20.0–28.0)
BICARBONATE: 23.8 mmol/L (ref 20.0–28.0)
O2 SAT: 91 %
O2 Saturation: 90 %
PCO2 ART: 43.1 mmHg (ref 32.0–48.0)
PO2 ART: 66 mmHg — AB (ref 83.0–108.0)
Patient temperature: 37.1
Patient temperature: 37.1
TCO2: 21 mmol/L (ref 0–100)
TCO2: 25 mmol/L (ref 0–100)
pCO2 arterial: 38.8 mmHg (ref 32.0–48.0)
pH, Arterial: 7.319 — ABNORMAL LOW (ref 7.350–7.450)
pH, Arterial: 7.35 (ref 7.350–7.450)
pO2, Arterial: 62 mmHg — ABNORMAL LOW (ref 83.0–108.0)

## 2016-12-11 LAB — GLUCOSE, CAPILLARY
GLUCOSE-CAPILLARY: 100 mg/dL — AB (ref 65–99)
GLUCOSE-CAPILLARY: 104 mg/dL — AB (ref 65–99)
GLUCOSE-CAPILLARY: 105 mg/dL — AB (ref 65–99)
GLUCOSE-CAPILLARY: 110 mg/dL — AB (ref 65–99)
GLUCOSE-CAPILLARY: 112 mg/dL — AB (ref 65–99)
GLUCOSE-CAPILLARY: 136 mg/dL — AB (ref 65–99)
GLUCOSE-CAPILLARY: 145 mg/dL — AB (ref 65–99)
GLUCOSE-CAPILLARY: 99 mg/dL (ref 65–99)
Glucose-Capillary: 103 mg/dL — ABNORMAL HIGH (ref 65–99)
Glucose-Capillary: 104 mg/dL — ABNORMAL HIGH (ref 65–99)
Glucose-Capillary: 107 mg/dL — ABNORMAL HIGH (ref 65–99)
Glucose-Capillary: 108 mg/dL — ABNORMAL HIGH (ref 65–99)
Glucose-Capillary: 109 mg/dL — ABNORMAL HIGH (ref 65–99)
Glucose-Capillary: 111 mg/dL — ABNORMAL HIGH (ref 65–99)
Glucose-Capillary: 120 mg/dL — ABNORMAL HIGH (ref 65–99)
Glucose-Capillary: 130 mg/dL — ABNORMAL HIGH (ref 65–99)

## 2016-12-11 LAB — BASIC METABOLIC PANEL
Anion gap: 5 (ref 5–15)
BUN: 15 mg/dL (ref 6–20)
CALCIUM: 7.9 mg/dL — AB (ref 8.9–10.3)
CO2: 22 mmol/L (ref 22–32)
CREATININE: 0.68 mg/dL (ref 0.44–1.00)
Chloride: 112 mmol/L — ABNORMAL HIGH (ref 101–111)
GFR calc non Af Amer: >60 mL/min (ref >60)
Glucose, Bld: 101 mg/dL — ABNORMAL HIGH (ref 65–99)
Potassium: 4.5 mmol/L (ref 3.5–5.1)
SODIUM: 139 mmol/L (ref 135–145)

## 2016-12-11 LAB — CREATININE, SERUM
CREATININE: 0.76 mg/dL (ref 0.44–1.00)
GFR calc non Af Amer: >60 mL/min (ref >60)

## 2016-12-11 LAB — CBC
HCT: 28.4 % — ABNORMAL LOW (ref 36.0–46.0)
HCT: 29.3 % — ABNORMAL LOW (ref 36.0–46.0)
HEMOGLOBIN: 9.4 g/dL — AB (ref 12.0–15.0)
Hemoglobin: 9.2 g/dL — ABNORMAL LOW (ref 12.0–15.0)
MCH: 28.5 pg (ref 26.0–34.0)
MCH: 28.2 pg (ref 26.0–34.0)
MCHC: 32.4 g/dL (ref 30.0–36.0)
MCHC: 32.1 g/dL (ref 30.0–36.0)
MCV: 87.9 fL (ref 78.0–100.0)
MCV: 88 fL (ref 78.0–100.0)
PLATELETS: 108 10*3/uL — AB (ref 150–400)
Platelets: 129 10*3/uL — ABNORMAL LOW (ref 150–400)
RBC: 3.23 MIL/uL — ABNORMAL LOW (ref 3.87–5.11)
RBC: 3.33 MIL/uL — AB (ref 3.87–5.11)
RDW: 14.1 % (ref 11.5–15.5)
RDW: 14.5 % (ref 11.5–15.5)
WBC: 12 10*3/uL — ABNORMAL HIGH (ref 4.0–10.5)
WBC: 15 10*3/uL — AB (ref 4.0–10.5)

## 2016-12-11 LAB — POCT I-STAT, CHEM 8
BUN: 12 mg/dL (ref 6–20)
CALCIUM ION: 1.15 mmol/L (ref 1.15–1.40)
CHLORIDE: 103 mmol/L (ref 101–111)
Creatinine, Ser: 0.7 mg/dL (ref 0.44–1.00)
Glucose, Bld: 162 mg/dL — ABNORMAL HIGH (ref 65–99)
HEMATOCRIT: 28 % — AB (ref 36.0–46.0)
Hemoglobin: 9.5 g/dL — ABNORMAL LOW (ref 12.0–15.0)
Potassium: 3.7 mmol/L (ref 3.5–5.1)
SODIUM: 138 mmol/L (ref 135–145)
TCO2: 22 mmol/L (ref 0–100)

## 2016-12-11 LAB — COOXEMETRY PANEL
Carboxyhemoglobin: 0.7 % (ref 0.5–1.5)
Methemoglobin: 1.3 % (ref 0.0–1.5)
O2 SAT: 56.9 %
TOTAL HEMOGLOBIN: 9.3 g/dL — AB (ref 12.0–16.0)

## 2016-12-11 LAB — MAGNESIUM
Magnesium: 2.5 mg/dL — ABNORMAL HIGH (ref 1.7–2.4)
Magnesium: 1.9 mg/dL (ref 1.7–2.4)

## 2016-12-11 LAB — TYPE AND SCREEN
Blood Product Expiration Date: 201802222359
ISSUE DATE / TIME: 201802011929
Unit Type and Rh: 6200

## 2016-12-11 MED ORDER — WARFARIN SODIUM 1 MG PO TABS
1.0000 mg | ORAL_TABLET | Freq: Every day | ORAL | Status: DC
Start: 2016-12-11 — End: 2016-12-14
  Administered 2016-12-11 – 2016-12-13 (×3): 1 mg via ORAL
  Filled 2016-12-11 (×3): qty 1

## 2016-12-11 MED ORDER — AMIODARONE HCL 200 MG PO TABS
200.0000 mg | ORAL_TABLET | Freq: Two times a day (BID) | ORAL | Status: DC
  Administered 2016-12-12 – 2016-12-18 (×13): 200 mg via ORAL
  Filled 2016-12-11 (×13): qty 1

## 2016-12-11 MED ORDER — ORAL CARE MOUTH RINSE
15.0000 mL | Freq: Two times a day (BID) | OROMUCOSAL | Status: DC
  Administered 2016-12-11 – 2016-12-15 (×9): 15 mL via OROMUCOSAL

## 2016-12-11 MED ORDER — POTASSIUM CHLORIDE 2 MEQ/ML IV SOLN
30.0000 meq | Freq: Once | INTRAVENOUS | Status: AC
  Administered 2016-12-11: 30 meq via INTRAVENOUS
  Filled 2016-12-11: qty 15

## 2016-12-11 MED ORDER — INSULIN ASPART 100 UNIT/ML ~~LOC~~ SOLN
0.0000 [IU] | SUBCUTANEOUS | Status: DC
  Administered 2016-12-11 – 2016-12-12 (×3): 2 [IU] via SUBCUTANEOUS
  Administered 2016-12-12: 4 [IU] via SUBCUTANEOUS
  Administered 2016-12-12 (×2): 2 [IU] via SUBCUTANEOUS

## 2016-12-11 MED ORDER — KETOROLAC TROMETHAMINE 15 MG/ML IJ SOLN
15.0000 mg | Freq: Once | INTRAMUSCULAR | Status: AC
  Administered 2016-12-11: 15 mg via INTRAVENOUS

## 2016-12-11 MED ORDER — DEXTROSE 5 % IV SOLN
8.0000 mg/h | INTRAVENOUS | Status: DC
  Administered 2016-12-11 – 2016-12-12 (×2): 8 mg/h via INTRAVENOUS
  Filled 2016-12-11 (×3): qty 25

## 2016-12-11 MED ORDER — WARFARIN - PHYSICIAN DOSING INPATIENT
Freq: Every day | Status: DC
  Administered 2016-12-12 – 2016-12-14 (×3): 1
  Administered 2016-12-15 – 2016-12-17 (×2)

## 2016-12-11 MED ORDER — KETOROLAC TROMETHAMINE 15 MG/ML IJ SOLN
INTRAMUSCULAR | Status: AC
  Filled 2016-12-11: qty 1

## 2016-12-11 MED ORDER — ASPIRIN EC 81 MG PO TBEC
81.0000 mg | DELAYED_RELEASE_TABLET | Freq: Every day | ORAL | Status: DC
  Administered 2016-12-11 – 2016-12-18 (×8): 81 mg via ORAL
  Filled 2016-12-11 (×8): qty 1

## 2016-12-11 MED ORDER — INSULIN DETEMIR 100 UNIT/ML ~~LOC~~ SOLN
20.0000 [IU] | Freq: Two times a day (BID) | SUBCUTANEOUS | Status: DC
  Administered 2016-12-11 – 2016-12-15 (×9): 20 [IU] via SUBCUTANEOUS
  Filled 2016-12-11 (×15): qty 0.2

## 2016-12-11 NOTE — Progress Notes (Signed)
CT surgery p.m. Rounds  Pulmonary status improved today after diuretics and transient treatment with BiPAP Patient atrially paced stable blood pressure Extremities warm abdomen soft Resting comfortably

## 2016-12-11 NOTE — Progress Notes (Signed)
Respiratory notified readiness to wean. 

## 2016-12-11 NOTE — Progress Notes (Signed)
Dangle deferred at this time d/t ongoing concerns for bleeding from R groin site. Will discuss with MD during rounds.

## 2016-12-11 NOTE — Progress Notes (Signed)
Rapid Wean Protocol started; vent changed to 40/4 by Respiratory. Pt calm/alert. Will continue to monitor.

## 2016-12-11 NOTE — Progress Notes (Signed)
Pt extubated at 0358 to 4L [place]. Advanced to 8L HFNC d/t sats in high 80s. Now satting 95%.  Pt's mechanics and ABG discussed with Dr. Roxan Hockey prior to extubation, as VC was 800 and goal is 1000 based on pt weight. Felt that this was d/t pt's anxiety and that pt would be fine working on this post-extubation. MD agreed and gave permission to extubate. Placed pressure over R groin site during extubation and coughing exercises per MD to prevent further bleeding. No new bleeding noted.  Pt resting, no concerns at this time. Will continue to monitor.

## 2016-12-11 NOTE — Care Management Note (Signed)
Case Management Note  Patient Details  Name: Kayla Hopkins MRN: [id number] Date of Birth: May 12, 1961  Subjective/Objective:      S/p MVR              Action/Plan:   PTA from home with husband.  CM will continue to follow for discharge needs   Expected Discharge Date:                  Expected Discharge Plan:  Home/Self Care  In-House Referral:     Discharge planning Services  CM Consult  Post Acute Care Choice:    Choice offered to:     DME Arranged:    DME Agency:     HH Arranged:    HH Agency:     Status of Service:  In process, will continue to follow  If discussed at Long Length of Stay Meetings, dates discussed:    Additional Comments:  Maryclare Labrador, RN 12/11/2016, 10:05 AM

## 2016-12-11 NOTE — Anesthesia Postprocedure Evaluation (Signed)
Anesthesia Post Note  Patient: Kayla Hopkins  Procedure(s) Performed: Procedure(s) (LRB): MITRAL VALVE REPAIR (MVR) (N/A) TRICUSPID VALVE REPAIR (N/A) MAZE (N/A) TRANSESOPHAGEAL ECHOCARDIOGRAM (TEE) (N/A)  Patient location during evaluation: SICU Anesthesia Type: General Level of consciousness: awake and awake and alert Pain management: pain level controlled Vital Signs Assessment: post-procedure vital signs reviewed and stable Respiratory status: spontaneous breathing, nonlabored ventilation and respiratory function stable Cardiovascular status: blood pressure returned to baseline Anesthetic complications: no       Last Vitals:  Vitals:   12/11/16 1400 12/11/16 1500  BP: 136/73   Pulse: 80   Resp: 14   Temp:  36.1 C    Last Pain:  Vitals:   12/11/16 1500  TempSrc: Oral  PainSc:                  Jaheim Canino COKER

## 2016-12-11 NOTE — Progress Notes (Addendum)
TCTS DAILY ICU PROGRESS NOTE                   [address]            [place] [address]          [phone]   1 Day Post-Op Procedure(s) (LRB): MITRAL VALVE REPAIR (MVR) (N/A) TRICUSPID VALVE REPAIR (N/A) MAZE (N/A) TRANSESOPHAGEAL ECHOCARDIOGRAM (TEE) (N/A)  Total Length of Stay:  LOS: 1 day   Subjective: Feels "thirsty"  Objective: Vital signs in last 24 hours: Temp:  [96.6 F (35.9 C)-99.1 F (37.3 C)] 98.4 F (36.9 C) (02/02 0715) Pulse Rate:  [79-87] 80 (02/02 0715) Cardiac Rhythm: Atrial paced (02/02 0000) Resp:  [9-27] 24 (02/02 0715) BP: (72-129)/(54-71) 112/71 (02/02 0700) SpO2:  [87 %-100 %] 87 % (02/02 0715) Arterial Line BP: (78-159)/(49-82) 128/61 (02/02 0715) FiO2 (%):  [40 %-60 %] 40 % (02/02 0301) Weight:  [236 lb 8.9 oz (107.3 kg)] 236 lb 8.9 oz (107.3 kg) (02/02 0500)  Filed Weights   12/10/16 0550 12/11/16 0500  Weight: 223 lb 9 oz (101.4 kg) 236 lb 8.9 oz (107.3 kg)    Weight change: 12 lb 15.9 oz (5.893 kg)   Hemodynamic parameters for last 24 hours: PAP: (31-66)/(20-40) 54/28 CO:  [3.9 L/min-639 L/min] 4.4 L/min CI:  [1.7 L/min/m2-2.4 L/min/m2] 2.1 L/min/m2  Intake/Output from previous day: 02/01 0701 - 02/02 0700 In: 8007.9 [P.O.:40; I.V.:5657.9; Blood:745; NG/GT:100; IV Piggyback:1415] Out: 4400 [Urine:2065; Blood:1625; Chest Tube:710]  Intake/Output this shift: No intake/output data recorded.  Current Meds: Scheduled Meds: . acetaminophen  1,000 mg Oral Q6H  . aspirin EC  325 mg Oral Daily  . bisacodyl  10 mg Oral Daily   Or  . bisacodyl  10 mg Rectal Daily  . cefUROXime (ZINACEF)  IV  1.5 g Intravenous Q12H  . docusate sodium  200 mg Oral Daily  . famotidine (PEPCID) IV  20 mg Intravenous Q12H  . insulin aspart  0-24 Units Subcutaneous Q4H  . insulin detemir  20 Units Subcutaneous BID  . insulin regular  0-10 Units Intravenous TID WC  . mouth rinse  15 mL Mouth Rinse BID  . [START ON 12/12/2016]  pantoprazole  40 mg Oral Daily  . sodium chloride flush  3 mL Intravenous Q12H   Continuous Infusions: . sodium chloride 20 mL/hr at 12/11/16 0700  . sodium chloride    . sodium chloride 20 mL/hr at 12/11/16 0700  . insulin (NOVOLIN-R) infusion 2.6 Units/hr (12/11/16 0700)  . lactated ringers 20 mL/hr at 12/11/16 0700  . lactated ringers 20 mL/hr at 12/11/16 0700  . milrinone 0.2 mcg/kg/min (12/11/16 0700)  . phenylephrine (NEO-SYNEPHRINE) Adult infusion 40 mcg/min (12/11/16 0700)   PRN Meds:.sodium chloride, albumin human, metoprolol, morphine injection, ondansetron (ZOFRAN) IV, oxyCODONE, sodium chloride flush, traMADol  General appearance: alert, cooperative and no distress Heart: regular rate and rhythm and soft rub Lungs: clear anteriorly but difficult exam Abdomen: soft, nontender Extremities: minor edema Wound: dressings intact, righ groin has had some bleeding, femstop being removed   Lab Results: CBC: Recent Labs  12/10/16 2245 12/10/16 2301 12/11/16 0450  WBC 11.5*  --  12.0*  HGB 9.4* 9.2* 9.2*  HCT 28.8* 27.0* 28.4*  PLT 123*  --  129*   BMET:  Recent Labs  12/08/16 1305  12/10/16 2301 12/11/16 0450  NA 140  < > 142 139  K 3.9  < > 4.3 4.5  CL 100*  < >  110 112*  CO2 28  --   --  22  GLUCOSE 103*  < > 126* 101*  BUN 18  < > 16 15  CREATININE 0.87  < > 0.60 0.68  CALCIUM 9.4  --   --  7.9*  < > = values in this interval not displayed.  CMET: Lab Results  Component Value Date   WBC 12.0 (H) 12/11/2016   HGB 9.2 (L) 12/11/2016   HCT 28.4 (L) 12/11/2016   PLT 129 (L) 12/11/2016   GLUCOSE 101 (H) 12/11/2016   CHOL 188 06/08/2012   TRIG 474.0 (H) 06/08/2012   HDL 46.00 06/08/2012   LDLDIRECT 77.8 06/08/2012   LDLCALC (H) 08/11/2008    147        Total Cholesterol/HDL:CHD Risk Coronary Heart Disease Risk Table                     Men   Women  1/2 Average Risk   3.4   3.3   ALT 23 12/08/2016   AST 24 12/08/2016   NA 139 12/11/2016   K 4.5  12/11/2016   CL 112 (H) 12/11/2016   CREATININE 0.68 12/11/2016   BUN 15 12/11/2016   CO2 22 12/11/2016   TSH 1.124 02/10/2016   INR 1.32 12/10/2016   HGBA1C 6.1 (H) 12/08/2016      PT/INR:  Recent Labs  12/10/16 2300  LABPROT 16.4*  INR 1.32   Radiology: Dg Chest Port 1 View  Result Date: 12/11/2016 CLINICAL DATA:  Status post mitral valve replacement, chest tube. EXAM: PORTABLE CHEST 1 VIEW COMPARISON:  Chest radiograph from one day prior. FINDINGS: Right internal jugular Swan-Ganz catheter terminates over the right pulmonary artery. Stable position of bilateral chest tubes, mediastinal drains, median sternotomy wires and cardiac valve annuloplasty ring. Stable cardiomediastinal silhouette with top-normal heart size. No pneumothorax. No pleural effusion. Stable low lung volumes. No overt pulmonary edema. Stable mild bibasilar atelectasis. IMPRESSION: Support structures in place.  No pneumothorax. Stable low lung volumes with mild bibasilar atelectasis. Electronically Signed   By: Ilona Sorrel M.D.   On: 12/11/2016 07:41   Dg Chest Port 1 View  Result Date: 12/10/2016 CLINICAL DATA:  Status post cardiac surgery EXAM: PORTABLE CHEST 1 VIEW COMPARISON:  12/08/2016 FINDINGS: Postsurgical changes are noted. Swan-Ganz catheter is noted in the right pulmonary artery. Bilateral thoracostomy catheters as well as a pericardial and mediastinal drain are seen. Nasogastric catheter is noted within the stomach. Endotracheal tube is seen in satisfactory position. The overall inspiratory effort is poor with minimal basilar atelectasis. No pneumothorax is seen. IMPRESSION: Tubes and lines as described. Minimal bibasilar atelectasis. Electronically Signed   By: Inez Catalina M.D.   On: 12/10/2016 15:32     Assessment/Plan: S/P Procedure(s) (LRB): MITRAL VALVE REPAIR (MVR) (N/A) TRICUSPID VALVE REPAIR (N/A) MAZE (N/A) TRANSESOPHAGEAL ECHOCARDIOGRAM (TEE) (N/A)  1 stable , CI 2.1 with elevated PAP ,  will need diuresis, could consider renal dose dopamine short term. She is on low dose milrinone and neo currently. Cont to atrial pace 2 renal fxn normal 3 Acute expected blood loss anemia- monitor closely. Monitor thrombocytopenia 4 requiring 15 liters [place] currently- consider CPAP short term 5 sugars controlled  GOLD,WAYNE E 12/11/2016 7:54 AM    I have seen and examined the patient and agree with the assessment and plan as outlined.  Overall doing well POD1 although had some bleeding from right groin femoral vein cannulation site overnight that required manual compression.  Groin looks fine and no bleeding since approx 11pm last night.  Currently sinus brady 50's - AAI pacing with stable hemodynamics on low dose milrinone and neo drips.  PA pressures lower than preop.  Will start lasix drip and slowly wean milrinone over next few days.  Needs increased mobility and pulmonary toilet - post op pain contributing to expected but significant post op atelectasis.  Will use CPAP and/or BiPAP at night and as needed.  Restart amiodarone and continue AAI pacing.  Start coumadin slowly.  Leave chest tubes in for now and possibly d/c tomorrow depending on output.   [name], MD 12/11/2016 9:20 AM

## 2016-12-11 NOTE — Progress Notes (Signed)
Anesthesiology Follow-up:  Awake and alert complaining of incisional chest soreness. As noted she had bleeding last night from her R. femoral venous cannulation site. No further bleeding since last night. Hemodynamically stable on low dose milrinone and neosynephrine, atrially paced.  VS: T- 36.7 BP- 130/70 HR- 80 RR- 14 O2 Sat 93% on 3L [place]  42/24 CO/CI- 5.3/2.4  K- 4.5 glucose- 101 BUN/Cr.- 15/0.68 H/H- 9.2/28.4 Platelets- 129,000  Extubated at 04:00 today, 12 hours post-op.  56 year old female with persistent atrial fib, moderate TR and severe TR one day S/P Maze procedure, MV anf TV annuloplasty rings.  Overall doing well, plan diuresis and mean milrinone and neosynephrine as tolerated.  Roberts Gaudy

## 2016-12-12 ENCOUNTER — Inpatient Hospital Stay (HOSPITAL_COMMUNITY): Payer: Medicare HMO

## 2016-12-12 LAB — POCT I-STAT, CHEM 8
BUN: 15 mg/dL (ref 6–20)
CALCIUM ION: 1.16 mmol/L (ref 1.15–1.40)
Chloride: 93 mmol/L — ABNORMAL LOW (ref 101–111)
Creatinine, Ser: 0.8 mg/dL (ref 0.44–1.00)
Glucose, Bld: 125 mg/dL — ABNORMAL HIGH (ref 65–99)
HEMATOCRIT: 28 % — AB (ref 36.0–46.0)
Hemoglobin: 9.5 g/dL — ABNORMAL LOW (ref 12.0–15.0)
Potassium: 3.4 mmol/L — ABNORMAL LOW (ref 3.5–5.1)
SODIUM: 136 mmol/L (ref 135–145)
TCO2: 29 mmol/L (ref 0–100)

## 2016-12-12 LAB — CBC
HEMATOCRIT: 27.4 % — AB (ref 36.0–46.0)
HEMOGLOBIN: 9.1 g/dL — AB (ref 12.0–15.0)
MCH: 29.3 pg (ref 26.0–34.0)
MCHC: 33.2 g/dL (ref 30.0–36.0)
MCV: 88.1 fL (ref 78.0–100.0)
Platelets: 92 10*3/uL — ABNORMAL LOW (ref 150–400)
RBC: 3.11 MIL/uL — AB (ref 3.87–5.11)
RDW: 14.9 % (ref 11.5–15.5)
WBC: 14.8 10*3/uL — ABNORMAL HIGH (ref 4.0–10.5)

## 2016-12-12 LAB — PROTIME-INR
INR: 1.29
PROTHROMBIN TIME: 16.2 s — AB (ref 11.4–15.2)

## 2016-12-12 LAB — BASIC METABOLIC PANEL
ANION GAP: 8 (ref 5–15)
BUN: 12 mg/dL (ref 6–20)
CHLORIDE: 102 mmol/L (ref 101–111)
CO2: 27 mmol/L (ref 22–32)
Calcium: 8.4 mg/dL — ABNORMAL LOW (ref 8.9–10.3)
Creatinine, Ser: 0.82 mg/dL (ref 0.44–1.00)
GFR calc non Af Amer: >60 mL/min (ref >60)
Glucose, Bld: 177 mg/dL — ABNORMAL HIGH (ref 65–99)
POTASSIUM: 3.6 mmol/L (ref 3.5–5.1)
Sodium: 137 mmol/L (ref 135–145)

## 2016-12-12 LAB — GLUCOSE, CAPILLARY
GLUCOSE-CAPILLARY: 177 mg/dL — AB (ref 65–99)
GLUCOSE-CAPILLARY: 111 mg/dL — AB (ref 65–99)
GLUCOSE-CAPILLARY: 115 mg/dL — AB (ref 65–99)
Glucose-Capillary: 143 mg/dL — ABNORMAL HIGH (ref 65–99)
Glucose-Capillary: 154 mg/dL — ABNORMAL HIGH (ref 65–99)

## 2016-12-12 LAB — COOXEMETRY PANEL
Carboxyhemoglobin: 0.7 % (ref 0.5–1.5)
METHEMOGLOBIN: 1.2 % (ref 0.0–1.5)
O2 Saturation: 75.7 %
Total hemoglobin: 8.7 g/dL — ABNORMAL LOW (ref 12.0–16.0)

## 2016-12-12 MED ORDER — SODIUM CHLORIDE 0.9 % IV SOLN
30.0000 meq | Freq: Once | INTRAVENOUS | Status: AC
  Administered 2016-12-12: 30 meq via INTRAVENOUS
  Filled 2016-12-12: qty 15

## 2016-12-12 NOTE — Progress Notes (Signed)
2 Days Post-Op Procedure(s) (LRB): MITRAL VALVE REPAIR (MVR) (N/A) TRICUSPID VALVE REPAIR (N/A) MAZE (N/A) TRANSESOPHAGEAL ECHOCARDIOGRAM (TEE) (N/A) Subjective: Resting comfortable in chair nsr co-ox > 70%, wean mil Excellent steady diuresis - cont lasix drip CT output > 500cc, leave Objective: Vital signs in last 24 hours: Temp:  [97 F (36.1 C)-98.6 F (37 C)] 97.6 F (36.4 C) (02/03 0405) Pulse Rate:  [79-81] 80 (02/03 0600) Cardiac Rhythm: Atrial paced (02/02 2000) Resp:  [10-24] 15 (02/03 0600) BP: (95-175)/(64-99) 175/72 (02/03 0600) SpO2:  [82 %-100 %] 98 % (02/03 0600) Arterial Line BP: (103-147)/(52-59) 113/57 (02/02 1000) FiO2 (%):  [70 %-100 %] 70 % (02/02 1626) Weight:  [235 lb 14.3 oz (107 kg)] 235 lb 14.3 oz (107 kg) (02/03 0600)  Hemodynamic parameters for last 24 hours: PAP: (42-60)/(24-32) 42/24  Intake/Output from previous day: 02/02 0701 - 02/03 0700 In: 1405 [I.V.:940; IV Piggyback:465] Out: [id number] [Urine:4330; Chest Tube:460] Intake/Output this shift: No intake/output data recorded.  Neuro intact extrem warm  Lab Results:  Recent Labs  12/11/16 1710 12/12/16 0410  WBC 15.0* 14.8*  HGB 9.4* 9.1*  HCT 29.3* 27.4*  PLT 108* 92*   BMET:  Recent Labs  12/11/16 0450  12/11/16 1646 12/12/16 0410  NA 139  --  138 137  K 4.5  --  3.7 3.6  CL 112*  --  103 102  CO2 22  --   --  27  GLUCOSE 101*  --  162* 177*  BUN 15  --  12 12  CREATININE 0.68  < > 0.70 0.82  CALCIUM 7.9*  --   --  8.4*  < > = values in this interval not displayed.  PT/INR:  Recent Labs  12/12/16 0410  LABPROT 16.2*  INR 1.29   ABG    Component Value Date/Time   PHART 7.350 12/11/2016 0458   HCO3 23.8 12/11/2016 0458   TCO2 22 12/11/2016 1646   ACIDBASEDEF 2.0 12/11/2016 0458   O2SAT 75.7 12/12/2016 0408   CBG (last 3)   Recent Labs  12/11/16 1930 12/11/16 2353 12/12/16 0406  GLUCAP 136* 145* 143*    Assessment/Plan: S/P Procedure(s) (LRB): MITRAL  VALVE REPAIR (MVR) (N/A) TRICUSPID VALVE REPAIR (N/A) MAZE (N/A) TRANSESOPHAGEAL ECHOCARDIOGRAM (TEE) (N/A) Mobilize Diuresis Diabetes control cont coumadin, supp potassium   LOS: 2 days    Kayla Hopkins 12/12/2016

## 2016-12-12 NOTE — Progress Notes (Signed)
Patient examined and record reviewed.Hemodynamics stable,labs satisfactory.Patient had stable day.Continue current care. Open remove chest tubes tomorrow Supplement potassium this p.m. and continue Lasix drip Tharon Aquas Trigt III 12/12/2016

## 2016-12-13 ENCOUNTER — Inpatient Hospital Stay (HOSPITAL_COMMUNITY): Payer: Medicare HMO

## 2016-12-13 LAB — GLUCOSE, CAPILLARY
GLUCOSE-CAPILLARY: 94 mg/dL (ref 65–99)
Glucose-Capillary: 100 mg/dL — ABNORMAL HIGH (ref 65–99)
Glucose-Capillary: 116 mg/dL — ABNORMAL HIGH (ref 65–99)
Glucose-Capillary: 105 mg/dL — ABNORMAL HIGH (ref 65–99)
Glucose-Capillary: 82 mg/dL (ref 65–99)
Glucose-Capillary: 92 mg/dL (ref 65–99)

## 2016-12-13 LAB — COMPREHENSIVE METABOLIC PANEL WITH GFR
ALT: 15 U/L (ref 14–54)
AST: 31 U/L (ref 15–41)
Albumin: 2.8 g/dL — ABNORMAL LOW (ref 3.5–5.0)
Alkaline Phosphatase: 57 U/L (ref 38–126)
Anion gap: 8 (ref 5–15)
BUN: 16 mg/dL (ref 6–20)
CO2: 31 mmol/L (ref 22–32)
Calcium: 8.5 mg/dL — ABNORMAL LOW (ref 8.9–10.3)
Chloride: 97 mmol/L — ABNORMAL LOW (ref 101–111)
Creatinine, Ser: 0.74 mg/dL (ref 0.44–1.00)
GFR calc Af Amer: >60 mL/min
GFR calc non Af Amer: >60 mL/min
Glucose, Bld: 103 mg/dL — ABNORMAL HIGH (ref 65–99)
Potassium: 3.4 mmol/L — ABNORMAL LOW (ref 3.5–5.1)
Sodium: 136 mmol/L (ref 135–145)
Total Bilirubin: 0.7 mg/dL (ref 0.3–1.2)
Total Protein: 5.4 g/dL — ABNORMAL LOW (ref 6.5–8.1)

## 2016-12-13 LAB — CBC
HCT: 26.6 % — ABNORMAL LOW (ref 36.0–46.0)
Hemoglobin: 8.6 g/dL — ABNORMAL LOW (ref 12.0–15.0)
MCH: 28.4 pg (ref 26.0–34.0)
MCHC: 32.3 g/dL (ref 30.0–36.0)
MCV: 87.8 fL (ref 78.0–100.0)
Platelets: 107 K/uL — ABNORMAL LOW (ref 150–400)
RBC: 3.03 MIL/uL — ABNORMAL LOW (ref 3.87–5.11)
RDW: 14.6 % (ref 11.5–15.5)
WBC: 16.2 K/uL — ABNORMAL HIGH (ref 4.0–10.5)

## 2016-12-13 LAB — PROTIME-INR
INR: 1.4
Prothrombin Time: 17.3 s — ABNORMAL HIGH (ref 11.4–15.2)

## 2016-12-13 MED ORDER — PNEUMOCOCCAL VAC POLYVALENT 25 MCG/0.5ML IJ INJ
0.5000 mL | INJECTION | INTRAMUSCULAR | Status: AC | PRN
  Administered 2016-12-16: 0.5 mL via INTRAMUSCULAR

## 2016-12-13 MED ORDER — POTASSIUM CHLORIDE CRYS ER 20 MEQ PO TBCR
20.0000 meq | EXTENDED_RELEASE_TABLET | Freq: Two times a day (BID) | ORAL | Status: DC
  Administered 2016-12-13: 20 meq via ORAL
  Filled 2016-12-13: qty 1

## 2016-12-13 MED ORDER — SODIUM CHLORIDE 0.9 % IV SOLN
30.0000 meq | Freq: Once | INTRAVENOUS | Status: AC
  Administered 2016-12-13: 30 meq via INTRAVENOUS
  Filled 2016-12-13: qty 15

## 2016-12-13 MED ORDER — INFLUENZA VAC SPLIT QUAD 0.5 ML IM SUSY
0.5000 mL | PREFILLED_SYRINGE | INTRAMUSCULAR | Status: AC | PRN
  Administered 2016-12-16: 0.5 mL via INTRAMUSCULAR

## 2016-12-13 MED ORDER — INSULIN ASPART 100 UNIT/ML ~~LOC~~ SOLN: 0.0000 [IU] | Freq: Every day | SUBCUTANEOUS | Status: DC

## 2016-12-13 MED ORDER — INSULIN ASPART 100 UNIT/ML ~~LOC~~ SOLN
0.0000 [IU] | Freq: Three times a day (TID) | SUBCUTANEOUS | Status: DC
  Administered 2016-12-18: 2 [IU] via SUBCUTANEOUS

## 2016-12-13 MED ORDER — FUROSEMIDE 10 MG/ML IJ SOLN
20.0000 mg | Freq: Four times a day (QID) | INTRAMUSCULAR | Status: DC
  Administered 2016-12-13 – 2016-12-14 (×4): 20 mg via INTRAVENOUS
  Filled 2016-12-13 (×4): qty 2

## 2016-12-13 NOTE — Progress Notes (Signed)
K+= 3.4 and creat= 0.74 w/ urine o/p > 30cc/hr; TCTS KCL protocol initiated with 64mEq KCL over 3 hours.

## 2016-12-13 NOTE — Progress Notes (Signed)
CT surgery p.m. Rounds  Patient had stable day with tubes and central line removed Ambulating Lasix scheduled for twice a day dosing Sinus rhythm

## 2016-12-13 NOTE — Progress Notes (Signed)
3 Days Post-Op Procedure(s) (LRB): MITRAL VALVE REPAIR (MVR) (N/A) TRICUSPID VALVE REPAIR (N/A) MAZE (N/A) TRANSESOPHAGEAL ECHOCARDIOGRAM (TEE) (N/A) Subjective: Feels much stronger, out of bed to chair Chest tube drainage significantly improved we'll remove tubes Maintaining sinus rhythm off pacemaker Blood pressure stable off milrinone Plan for ambulation hallway today  Objective: Vital signs in last 24 hours: Temp:  [97.6 F (36.4 C)-98.9 F (37.2 C)] 98.8 F (37.1 C) (02/04 0415) Pulse Rate:  [64-90] 78 (02/04 0700) Cardiac Rhythm: Normal sinus rhythm (02/03 2000) Resp:  [10-28] 15 (02/04 0700) BP: (126-173)/(66-88) 132/72 (02/04 0700) SpO2:  [91 %-100 %] 98 % (02/04 0700) Weight:  [231 lb 11.3 oz (105.1 kg)] 231 lb 11.3 oz (105.1 kg) (02/04 0600)  Hemodynamic parameters for last 24 hours:  stable  Intake/Output from previous day: 02/03 0701 - 02/04 0700 In: 3950.8 [P.O.:2760; I.V.:660.8; IV Piggyback:530] Out: [id number] [Urine:3235; Chest Tube:360] Intake/Output this shift: Total I/O In: 268 [P.O.:240; I.V.:28] Out: 345 [Urine:325; Chest Tube:20]       Exam    General- alert and comfortable   Lungs- clear without rales, wheezes   Cor- regular rate and rhythm, no murmur , gallop   Abdomen- soft, non-tender   Extremities - warm, non-tender, minimal edema   Neuro- oriented, appropriate, no focal weakness   Lab Results:  Recent Labs  12/12/16 0410 12/12/16 1628 12/13/16 0345  WBC 14.8*  --  16.2*  HGB 9.1* 9.5* 8.6*  HCT 27.4* 28.0* 26.6*  PLT 92*  --  107*   BMET:  Recent Labs  12/12/16 0410 12/12/16 1628 12/13/16 0345  NA 137 136 136  K 3.6 3.4* 3.4*  CL 102 93* 97*  CO2 27  --  31  GLUCOSE 177* 125* 103*  BUN 12 15 16   CREATININE 0.82 0.80 0.74  CALCIUM 8.4*  --  8.5*    PT/INR:  Recent Labs  12/13/16 0345  LABPROT 17.3*  INR 1.40   ABG    Component Value Date/Time   PHART 7.350 12/11/2016 0458   HCO3 23.8 12/11/2016 0458   TCO2 29  12/12/2016 1628   ACIDBASEDEF 2.0 12/11/2016 0458   O2SAT 75.7 12/12/2016 0408   CBG (last 3)   Recent Labs  12/13/16 0019 12/13/16 0343 12/13/16 0803  GLUCAP 94 100* 116*    Assessment/Plan: S/P Procedure(s) (LRB): MITRAL VALVE REPAIR (MVR) (N/A) TRICUSPID VALVE REPAIR (N/A) MAZE (N/A) TRANSESOPHAGEAL ECHOCARDIOGRAM (TEE) (N/A) Transition from Lasix drip to twice a day IV Lasix Supplement potassium Mobilize Remove large right IJ after potassium supplement administered   LOS: 3 days    Tharon Aquas Trigt III 12/13/2016

## 2016-12-13 NOTE — Plan of Care (Signed)
Problem: Cardiac: Goal: Hemodynamic stability will improve Outcome: Progressing Pt able to maintian good hemodynamic after all pressors weaned of. Pt heart rate stable in NSR without ectopy. Pascing wires protected andf secure. Goal: Will show no signs and symptoms of excessive bleeding Outcome: Progressing Rt femoral cannulation site without s/s bleeding after fem-stop reapplied fpost op for bleeding. CT drainage decreasing appropriately.

## 2016-12-14 ENCOUNTER — Inpatient Hospital Stay (HOSPITAL_COMMUNITY): Payer: Medicare HMO

## 2016-12-14 LAB — COMPREHENSIVE METABOLIC PANEL
ALT: 13 U/L — ABNORMAL LOW (ref 14–54)
AST: 22 U/L (ref 15–41)
Albumin: 2.4 g/dL — ABNORMAL LOW (ref 3.5–5.0)
Alkaline Phosphatase: 57 U/L (ref 38–126)
Anion gap: 8 (ref 5–15)
BUN: 19 mg/dL (ref 6–20)
CO2: 33 mmol/L — ABNORMAL HIGH (ref 22–32)
Calcium: 8.2 mg/dL — ABNORMAL LOW (ref 8.9–10.3)
Chloride: 92 mmol/L — ABNORMAL LOW (ref 101–111)
Creatinine, Ser: 0.64 mg/dL (ref 0.44–1.00)
GFR calc Af Amer: >60 mL/min (ref >60)
GFR calc non Af Amer: >60 mL/min (ref >60)
Glucose, Bld: 87 mg/dL (ref 65–99)
Potassium: 3.5 mmol/L (ref 3.5–5.1)
Sodium: 133 mmol/L — ABNORMAL LOW (ref 135–145)
Total Bilirubin: 1 mg/dL (ref 0.3–1.2)
Total Protein: 4.9 g/dL — ABNORMAL LOW (ref 6.5–8.1)

## 2016-12-14 LAB — CBC
HCT: 24 % — ABNORMAL LOW (ref 36.0–46.0)
Hemoglobin: 7.7 g/dL — ABNORMAL LOW (ref 12.0–15.0)
MCH: 28.2 pg (ref 26.0–34.0)
MCHC: 32.1 g/dL (ref 30.0–36.0)
MCV: 87.9 fL (ref 78.0–100.0)
Platelets: 129 10*3/uL — ABNORMAL LOW (ref 150–400)
RBC: 2.73 MIL/uL — ABNORMAL LOW (ref 3.87–5.11)
RDW: 14.8 % (ref 11.5–15.5)
WBC: 13 10*3/uL — ABNORMAL HIGH (ref 4.0–10.5)

## 2016-12-14 LAB — GLUCOSE, CAPILLARY
GLUCOSE-CAPILLARY: 107 mg/dL — AB (ref 65–99)
GLUCOSE-CAPILLARY: 89 mg/dL (ref 65–99)
GLUCOSE-CAPILLARY: 92 mg/dL (ref 65–99)
Glucose-Capillary: 88 mg/dL (ref 65–99)

## 2016-12-14 LAB — PROTIME-INR
INR: 1.64
Prothrombin Time: 19.6 seconds — ABNORMAL HIGH (ref 11.4–15.2)

## 2016-12-14 MED ORDER — WARFARIN SODIUM 2 MG PO TABS
2.0000 mg | ORAL_TABLET | Freq: Every day | ORAL | Status: DC
  Administered 2016-12-14 – 2016-12-16 (×3): 2 mg via ORAL
  Filled 2016-12-14 (×3): qty 1

## 2016-12-14 MED ORDER — SODIUM CHLORIDE 0.9 % IV SOLN: 250.0000 mL | INTRAVENOUS | Status: DC | PRN

## 2016-12-14 MED ORDER — POLYSACCHARIDE IRON COMPLEX 150 MG PO CAPS
150.0000 mg | ORAL_CAPSULE | Freq: Every day | ORAL | Status: DC
Start: 2016-12-14 — End: 2016-12-18
  Administered 2016-12-14 – 2016-12-18 (×5): 150 mg via ORAL
  Filled 2016-12-14 (×5): qty 1

## 2016-12-14 MED ORDER — ATORVASTATIN CALCIUM 40 MG PO TABS
40.0000 mg | ORAL_TABLET | Freq: Every day | ORAL | Status: DC
  Administered 2016-12-14 – 2016-12-17 (×4): 40 mg via ORAL
  Filled 2016-12-14 (×4): qty 1

## 2016-12-14 MED ORDER — MOVING RIGHT ALONG BOOK
Freq: Once | Status: AC
  Administered 2016-12-14: 14:00:00
  Filled 2016-12-14: qty 1

## 2016-12-14 MED ORDER — POTASSIUM CHLORIDE CRYS ER 20 MEQ PO TBCR
40.0000 meq | EXTENDED_RELEASE_TABLET | Freq: Three times a day (TID) | ORAL | Status: DC
  Administered 2016-12-14 – 2016-12-15 (×5): 40 meq via ORAL
  Filled 2016-12-14 (×5): qty 2

## 2016-12-14 MED ORDER — METOPROLOL SUCCINATE ER 25 MG PO TB24
25.0000 mg | ORAL_TABLET | Freq: Every day | ORAL | Status: DC
Start: 2016-12-14 — End: 2016-12-18
  Administered 2016-12-14 – 2016-12-18 (×5): 25 mg via ORAL
  Filled 2016-12-14 (×5): qty 1

## 2016-12-14 MED ORDER — FA-PYRIDOXINE-CYANOCOBALAMIN 2.5-25-2 MG PO TABS
1.0000 | ORAL_TABLET | Freq: Every day | ORAL | Status: DC
  Administered 2016-12-14 – 2016-12-18 (×5): 1 via ORAL
  Filled 2016-12-14 (×5): qty 1

## 2016-12-14 MED ORDER — SODIUM CHLORIDE 0.9% FLUSH
3.0000 mL | Freq: Two times a day (BID) | INTRAVENOUS | Status: DC
  Administered 2016-12-14 – 2016-12-16 (×5): 3 mL via INTRAVENOUS

## 2016-12-14 MED ORDER — FUROSEMIDE 10 MG/ML IJ SOLN
40.0000 mg | Freq: Two times a day (BID) | INTRAMUSCULAR | Status: DC
  Administered 2016-12-14 – 2016-12-17 (×7): 40 mg via INTRAVENOUS
  Filled 2016-12-14 (×7): qty 4

## 2016-12-14 MED ORDER — SODIUM CHLORIDE 0.9% FLUSH: 3.0000 mL | INTRAVENOUS | Status: DC | PRN

## 2016-12-14 MED FILL — Sodium Bicarbonate IV Soln 8.4%: INTRAVENOUS | Qty: 50 | Status: AC

## 2016-12-14 MED FILL — Lidocaine HCl IV Inj 20 MG/ML: INTRAVENOUS | Qty: 5 | Status: AC

## 2016-12-14 MED FILL — Electrolyte-R (PH 7.4) Solution: INTRAVENOUS | Qty: 4000 | Status: AC

## 2016-12-14 MED FILL — Mannitol IV Soln 20%: INTRAVENOUS | Qty: 500 | Status: AC

## 2016-12-14 MED FILL — Sodium Chloride IV Soln 0.9%: INTRAVENOUS | Qty: 1000 | Status: AC

## 2016-12-14 MED FILL — Heparin Sodium (Porcine) Inj 1000 Unit/ML: INTRAMUSCULAR | Qty: 20 | Status: AC

## 2016-12-14 NOTE — Progress Notes (Signed)
      [address]       [place] [address]             [phone]        CARDIOTHORACIC SURGERY PROGRESS NOTE   R4 Days Post-Op Procedure(s) (LRB): MITRAL VALVE REPAIR (MVR) (N/A) TRICUSPID VALVE REPAIR (N/A) MAZE (N/A) TRANSESOPHAGEAL ECHOCARDIOGRAM (TEE) (N/A)  Subjective: Looks good and feels well.  Soreness much improved. Slept well.  Appetite okay but can't chew solid food due to dental extraction  Objective: Vital signs: BP Readings from Last 1 Encounters:  12/14/16 108/65   Pulse Readings from Last 1 Encounters:  12/14/16 78   Resp Readings from Last 1 Encounters:  12/14/16 20   Temp Readings from Last 1 Encounters:  12/14/16 97.7 F (36.5 C) (Oral)    Hemodynamics:    Physical Exam:  Rhythm:   sinus  Breath sounds: clear  Heart sounds:  RRR w/out murmur  Incisions:  Clean and dry  Abdomen:  Soft, non-distended, non-tender  Extremities:  Warm, well-perfused    Intake/Output from previous day: 02/04 0701 - 02/05 0700 In: 2082 [P.O.:2040; I.V.:42] Out: 2175 [Urine:2155; Chest Tube:20] Intake/Output this shift: Total I/O In: -  Out: 195 [Urine:195]  Lab Results:  CBC: Recent Labs  12/13/16 0345 12/14/16 0210  WBC 16.2* 13.0*  HGB 8.6* 7.7*  HCT 26.6* 24.0*  PLT 107* 129*    BMET:  Recent Labs  12/13/16 0345 12/14/16 0210  NA 136 133*  K 3.4* 3.5  CL 97* 92*  CO2 31 33*  GLUCOSE 103* 87  BUN 16 19  CREATININE 0.74 0.64  CALCIUM 8.5* 8.2*     PT/INR:   Recent Labs  12/14/16 0210  LABPROT 19.6*  INR 1.64    CBG (last 3)   Recent Labs  12/13/16 1657 12/13/16 2155 12/14/16 0751  GLUCAP 82 92 88    ABG    Component Value Date/Time   PHART 7.350 12/11/2016 0458   PCO2ART 43.1 12/11/2016 0458   PO2ART 62.0 (L) 12/11/2016 0458   HCO3 23.8 12/11/2016 0458   TCO2 29 12/12/2016 1628   ACIDBASEDEF 2.0 12/11/2016 0458   O2SAT 75.7 12/12/2016 0408    CXR: PORTABLE CHEST 1 VIEW  COMPARISON:   12/13/2016 and 12/08/2016  FINDINGS: There is progressive haziness throughout the right lung. Heart size and vascularity are normal in the left lung is clear.  Chest tubes have been removed.  No pneumothorax.  IMPRESSION: Slight progression of haziness in the right lung which probably represents a combination of slight residual pulmonary edema and atelectasis.   Electronically Signed   By: Lorriane Shire M.D.   On: 12/14/2016 07:26   Assessment/Plan: S/P Procedure(s) (LRB): MITRAL VALVE REPAIR (MVR) (N/A) TRICUSPID VALVE REPAIR (N/A) MAZE (N/A) TRANSESOPHAGEAL ECHOCARDIOGRAM (TEE) (N/A)  Overall doing well POD4 Maintaining NSR w/ stable BP  Breathing comfortably w/ O2 sats 99-100% but still on HFNC - no attempts to wean Expected post op acute blood loss anemia, Hgb down slightly 7.7 this morning Expected post op atelectasis, improved since chest tubes removed Acute on chronic diastolic CHF with expected post-op volume excess, I/O's balanced yesterday but weight down 2 lbs, still 6 lbs > preop OSA on sleep apnea Morbid obesity Chronic back pain, anxiety PTSD Depression   Mobilize  Diuresis  Wean O2  Watch anemia  Transfer 2W if O2 sats stable w/ less O2 requirement   [name], MD 12/14/2016 8:23 AM

## 2016-12-14 NOTE — Anesthesia Postprocedure Evaluation (Signed)
Anesthesia Post Note  Patient: Kayla Hopkins  Procedure(s) Performed: Procedure(s) (LRB): MITRAL VALVE REPAIR (MVR) (N/A) TRICUSPID VALVE REPAIR (N/A) MAZE (N/A) TRANSESOPHAGEAL ECHOCARDIOGRAM (TEE) (N/A)  Patient location during evaluation: SICU Anesthesia Type: General Level of consciousness: sedated Pain management: pain level controlled Respiratory status: patient on ventilator - see flowsheet for VS and patient remains intubated per anesthesia plan Cardiovascular status: blood pressure returned to baseline Anesthetic complications: no       Last Vitals:  Vitals:   12/14/16 1553 12/14/16 1554  BP: 121/82   Pulse:  79  Resp:  18  Temp:      Last Pain:  Vitals:   12/14/16 1638  TempSrc:   PainSc: 8                  [name]

## 2016-12-14 NOTE — Progress Notes (Signed)
      [address]       [place] [address]             [phone]      Ambulated twice today  BP 121/82   Pulse 79   Temp 98.3 F (36.8 C) (Oral)   Resp 18   Ht 5\' 6"  (1.676 m)   Wt 229 lb 8 oz (104.1 kg)   LMP 11/09/2008   SpO2 95%   BMI 37.04 kg/m    Intake/Output Summary (Last 24 hours) at 12/14/16 1810 Last data filed at 12/14/16 1600  Gross per 24 hour  Intake              840 ml  Output             1660 ml  Net             -820 ml   No PM labs  Remo Lipps C. Roxan Hockey, MD [facility] [phone]

## 2016-12-14 NOTE — Discharge Instructions (Signed)

## 2016-12-15 ENCOUNTER — Inpatient Hospital Stay (HOSPITAL_COMMUNITY): Payer: Medicare HMO

## 2016-12-15 LAB — CBC
HEMATOCRIT: 23.9 % — AB (ref 36.0–46.0)
HEMOGLOBIN: 7.8 g/dL — AB (ref 12.0–15.0)
MCH: 28.9 pg (ref 26.0–34.0)
MCHC: 32.6 g/dL (ref 30.0–36.0)
MCV: 88.5 fL (ref 78.0–100.0)
Platelets: 170 10*3/uL (ref 150–400)
RBC: 2.7 MIL/uL — ABNORMAL LOW (ref 3.87–5.11)
RDW: 14.9 % (ref 11.5–15.5)
WBC: 9.8 10*3/uL (ref 4.0–10.5)

## 2016-12-15 LAB — BASIC METABOLIC PANEL
Anion gap: 12 (ref 5–15)
BUN: 22 mg/dL — ABNORMAL HIGH (ref 6–20)
CALCIUM: 8.6 mg/dL — AB (ref 8.9–10.3)
CHLORIDE: 88 mmol/L — AB (ref 101–111)
CO2: 34 mmol/L — ABNORMAL HIGH (ref 22–32)
CREATININE: 0.79 mg/dL (ref 0.44–1.00)
GFR calc non Af Amer: >60 mL/min (ref >60)
GLUCOSE: 96 mg/dL (ref 65–99)
Potassium: 3.5 mmol/L (ref 3.5–5.1)
Sodium: 134 mmol/L — ABNORMAL LOW (ref 135–145)

## 2016-12-15 LAB — PREPARE RBC (CROSSMATCH)

## 2016-12-15 LAB — GLUCOSE, CAPILLARY
GLUCOSE-CAPILLARY: 98 mg/dL (ref 65–99)
Glucose-Capillary: 78 mg/dL (ref 65–99)
Glucose-Capillary: 85 mg/dL (ref 65–99)
Glucose-Capillary: 89 mg/dL (ref 65–99)

## 2016-12-15 LAB — PROTIME-INR
INR: 1.5
Prothrombin Time: 18.3 seconds — ABNORMAL HIGH (ref 11.4–15.2)

## 2016-12-15 MED ORDER — SODIUM CHLORIDE 0.9 % IV SOLN
Freq: Once | INTRAVENOUS | Status: AC
  Administered 2016-12-15: 11:00:00 via INTRAVENOUS

## 2016-12-15 MED ORDER — POTASSIUM CHLORIDE CRYS ER 20 MEQ PO TBCR
40.0000 meq | EXTENDED_RELEASE_TABLET | Freq: Once | ORAL | Status: AC
  Administered 2016-12-15: 40 meq via ORAL
  Filled 2016-12-15: qty 2

## 2016-12-15 MED ORDER — FUROSEMIDE 10 MG/ML IJ SOLN
40.0000 mg | Freq: Once | INTRAMUSCULAR | Status: AC
  Administered 2016-12-15: 40 mg via INTRAVENOUS
  Filled 2016-12-15: qty 4

## 2016-12-15 NOTE — Significant Event (Signed)
Patient ambulated halfway to 2W25, then taken by wheelchair. Ambulated approximately 250 feet. VS stable prior and during the ambulation. Patient settled in bed per her requests. Receiving RN in room prior to RN leaving.     Kayla Hopkins

## 2016-12-15 NOTE — Progress Notes (Signed)
Pt received from 2S RN. Pt oriented to room and equipment. VSS - pt 100% on 3L nasal cannula. Telemetry applied, CCMD notified.   Fritz Pickerel, RN

## 2016-12-15 NOTE — Progress Notes (Signed)
      [address]       ,[place] [address]             [phone]        CARDIOTHORACIC SURGERY PROGRESS NOTE   R5 Days Post-Op Procedure(s) (LRB): MITRAL VALVE REPAIR (MVR) (N/A) TRICUSPID VALVE REPAIR (N/A) MAZE (N/A) TRANSESOPHAGEAL ECHOCARDIOGRAM (TEE) (N/A)  Subjective: Feels well.  No complaints  Objective: Vital signs: BP Readings from Last 1 Encounters:  12/15/16 110/77   Pulse Readings from Last 1 Encounters:  12/15/16 67   Resp Readings from Last 1 Encounters:  12/15/16 15   Temp Readings from Last 1 Encounters:  12/15/16 98.4 F (36.9 C) (Oral)    Hemodynamics:    Physical Exam:  Rhythm:   sinus  Breath sounds: clear  Heart sounds:  RRR w/out murmur  Incisions:  Clean and dry  Abdomen:  Soft, non-distended, non-tender  Extremities:  Warm, well-perfused    Intake/Output from previous day: 02/05 0701 - 02/06 0700 In: 1080 [P.O.:1080] Out: 2070 [Urine:2070] Intake/Output this shift: No intake/output data recorded.  Lab Results:  CBC: Recent Labs  12/14/16 0210 12/15/16 0609  WBC 13.0* 9.8  HGB 7.7* 7.8*  HCT 24.0* 23.9*  PLT 129* 170    BMET:  Recent Labs  12/14/16 0210 12/15/16 0609  NA 133* 134*  K 3.5 3.5  CL 92* 88*  CO2 33* 34*  GLUCOSE 87 96  BUN 19 22*  CREATININE 0.64 0.79  CALCIUM 8.2* 8.6*     PT/INR:   Recent Labs  12/15/16 0609  LABPROT 18.3*  INR 1.50    CBG (last 3)   Recent Labs  12/14/16 1537 12/14/16 2138 12/15/16 0731  GLUCAP 89 92 78    ABG    Component Value Date/Time   PHART 7.350 12/11/2016 0458   PCO2ART 43.1 12/11/2016 0458   PO2ART 62.0 (L) 12/11/2016 0458   HCO3 23.8 12/11/2016 0458   TCO2 29 12/12/2016 1628   ACIDBASEDEF 2.0 12/11/2016 0458   O2SAT 75.7 12/12/2016 0408    CXR: pending  Assessment/Plan: S/P Procedure(s) (LRB): MITRAL VALVE REPAIR (MVR) (N/A) TRICUSPID VALVE REPAIR (N/A) MAZE (N/A) TRANSESOPHAGEAL ECHOCARDIOGRAM (TEE)  (N/A)  Overall doing well POD5 Maintaining NSR w/ stable BP  Breathing comfortably w/ O2 sats 99-100% but still on HFNC - no attempts to wean Expected post op acute blood loss anemia, Hgb 7.8 this morning Expected post op atelectasis, improved since chest tubes removed Acute on chronic diastolic CHF with expected post-op volume excess, I/O's negative 1 liter yesterday but weight stable, still 6 lbs > preop OSA on sleep apnea Morbid obesity Chronic back pain, anxiety PTSD Depression   Mobilize  Diuresis  Wean O2  Transfuse 2 units PRBC's for acute blood loss anemia w/ increased O2 requirement  Transfer 2W if O2 sats stable w/ less O2 requirement  [name], MD 12/15/2016 7:53 AM

## 2016-12-15 NOTE — Plan of Care (Signed)
Problem: Activity: Goal: Risk for activity intolerance will decrease Outcome: Progressing Pt on 8l HFNC during day and CPAP @ night. Ambulate on 80% partial non-rebreather

## 2016-12-15 NOTE — Progress Notes (Signed)
[id number] Came to see pt to walk. Has walked twice already but wanted to go to bathroom. Pt stated she thought she could go on RA. Took pt to bathroom with walker and then back to bed. RA sats at 85%..put on 3L to get sats up and then back to 2L when stable. Put enough extension tubing on so that pt can walk to bathroom with asst on 2L. Sats to 99% with rest. Graylon Good RN BSN 12/15/2016 3:17 PM

## 2016-12-16 LAB — BASIC METABOLIC PANEL
ANION GAP: 10 (ref 5–15)
BUN: 20 mg/dL (ref 6–20)
CHLORIDE: 93 mmol/L — AB (ref 101–111)
CO2: 34 mmol/L — AB (ref 22–32)
Calcium: 8.7 mg/dL — ABNORMAL LOW (ref 8.9–10.3)
Creatinine, Ser: 0.82 mg/dL (ref 0.44–1.00)
GFR calc Af Amer: >60 mL/min (ref >60)
GFR calc non Af Amer: >60 mL/min (ref >60)
GLUCOSE: 87 mg/dL (ref 65–99)
POTASSIUM: 4.9 mmol/L (ref 3.5–5.1)
Sodium: 137 mmol/L (ref 135–145)

## 2016-12-16 LAB — CBC
HEMATOCRIT: 30.4 % — AB (ref 36.0–46.0)
Hemoglobin: 10 g/dL — ABNORMAL LOW (ref 12.0–15.0)
MCH: 28.7 pg (ref 26.0–34.0)
MCHC: 32.9 g/dL (ref 30.0–36.0)
MCV: 87.1 fL (ref 78.0–100.0)
PLATELETS: 179 10*3/uL (ref 150–400)
RBC: 3.49 MIL/uL — AB (ref 3.87–5.11)
RDW: 14.8 % (ref 11.5–15.5)
WBC: 10.4 10*3/uL (ref 4.0–10.5)

## 2016-12-16 LAB — GLUCOSE, CAPILLARY
GLUCOSE-CAPILLARY: 125 mg/dL — AB (ref 65–99)
Glucose-Capillary: 117 mg/dL — ABNORMAL HIGH (ref 65–99)
Glucose-Capillary: 95 mg/dL (ref 65–99)
Glucose-Capillary: 114 mg/dL — ABNORMAL HIGH (ref 65–99)

## 2016-12-16 LAB — PROTIME-INR
INR: 1.75
Prothrombin Time: 20.6 seconds — ABNORMAL HIGH (ref 11.4–15.2)

## 2016-12-16 LAB — TYPE AND SCREEN
Blood Product Expiration Date: 201802092359
Blood Product Expiration Date: 201802222359
ISSUE DATE / TIME: 201802061529
ISSUE DATE / TIME: 201802061039
UNIT TYPE AND RH: 6200
UNIT TYPE AND RH: 6200

## 2016-12-16 MED ORDER — POTASSIUM CHLORIDE CRYS ER 20 MEQ PO TBCR
20.0000 meq | EXTENDED_RELEASE_TABLET | Freq: Two times a day (BID) | ORAL | Status: DC
  Administered 2016-12-16 – 2016-12-18 (×5): 20 meq via ORAL
  Filled 2016-12-16 (×5): qty 1

## 2016-12-16 MED ORDER — NITROGLYCERIN 0.4 MG SL SUBL
SUBLINGUAL_TABLET | SUBLINGUAL | Status: AC
  Filled 2016-12-16: qty 1

## 2016-12-16 NOTE — Care Management Important Message (Signed)
Important Message  Patient Details  Name: Kayla Hopkins MRN: [id number] Date of Birth: 01-Jun-1961   Medicare Important Message Given:  Yes    Phiona Ramnauth Abena 12/16/2016, 11:40 AM

## 2016-12-16 NOTE — Progress Notes (Addendum)
      [address]       [place] [address]             [phone]      6 Days Post-Op Procedure(s) (LRB): MITRAL VALVE REPAIR (MVR) (N/A) TRICUSPID VALVE REPAIR (N/A) MAZE (N/A) TRANSESOPHAGEAL ECHOCARDIOGRAM (TEE) (N/A) Subjective: Feels okay this morning. Very thirsty. No other issues.   Objective: Vital signs in last 24 hours: Temp:  [97.6 F (36.4 C)-98.3 F (36.8 C)] 98.1 F (36.7 C) (02/07 0502) Pulse Rate:  [67-80] 72 (02/07 0502) Cardiac Rhythm: Normal sinus rhythm (02/06 1900) Resp:  [12-23] 20 (02/07 0502) BP: (113-146)/(48-100) 120/97 (02/07 0502) SpO2:  [91 %-100 %] 96 % (02/07 0502) Weight:  [103.1 kg (227 lb 4.8 oz)] 103.1 kg (227 lb 4.8 oz) (02/07 0500)     Intake/Output from previous day: 02/06 0701 - 02/07 0700 In: 1100 [P.O.:360; I.V.:40; Blood:700] Out: 1600 [Urine:1600] Intake/Output this shift: No intake/output data recorded.  General appearance: alert, cooperative and no distress Heart: regular rate and rhythm, S1, S2 normal, no murmur, click, rub or gallop Lungs: CTA bilaterally, some faint rhonchi in upper lobes Abdomen: soft, non-tender; bowel sounds normal; no masses,  no organomegaly Extremities: extremities normal, atraumatic, no cyanosis or edema Wound: clean and dry without drainage  Lab Results:  Recent Labs  12/15/16 0609 12/16/16 0250  WBC 9.8 10.4  HGB 7.8* 10.0*  HCT 23.9* 30.4*  PLT 170 179   BMET:  Recent Labs  12/15/16 0609 12/16/16 0250  NA 134* 137  K 3.5 4.9  CL 88* 93*  CO2 34* 34*  GLUCOSE 96 87  BUN 22* 20  CREATININE 0.79 0.82  CALCIUM 8.6* 8.7*    PT/INR:  Recent Labs  12/16/16 0250  LABPROT 20.6*  INR 1.75   ABG    Component Value Date/Time   PHART 7.350 12/11/2016 0458   HCO3 23.8 12/11/2016 0458   TCO2 29 12/12/2016 1628   ACIDBASEDEF 2.0 12/11/2016 0458   O2SAT 75.7 12/12/2016 0408   CBG (last 3)   Recent Labs  12/15/16 1626 12/15/16 2039 12/16/16 0618    GLUCAP 98 89 117*    Assessment/Plan: S/P Procedure(s) (LRB): MITRAL VALVE REPAIR (MVR) (N/A) TRICUSPID VALVE REPAIR (N/A) MAZE (N/A) TRANSESOPHAGEAL ECHOCARDIOGRAM (TEE) (N/A)  CV-NSR in the 70s. Good Bp. On Amio Po, and Toprol Xl. EPW out today Pulm-On 2L [place] without issue. CXR pending this morning. Encourage incentive spirometry/pulm toilet Renal-creatinine 0.82. Hold any potassium supplementation due to hyperkalemia. Continue IV Lasix. Good urine output. Weight continues to trend down.  H and H- expected post-op anemia. Improving s/p transfusion yesterday Endo-well controlled blood glucose level Anticoagulation-Coumadin 2mg  daily. INR 1.75  Plan: She is overall progressing well. Discontinue EPW since rhythm has been stable. Continue to use IS and pulmonary toilet. Ambulate TID. Diuresis.     LOS: 6 days    Elgie Collard 12/16/2016    I have seen and examined the patient and agree with the assessment and plan as outlined.  [name], MD 12/16/2016 8:34 AM

## 2016-12-16 NOTE — Progress Notes (Signed)
Patient walked 500 feet with walker without difficulty.

## 2016-12-16 NOTE — Progress Notes (Signed)
CARDIAC REHAB PHASE I   PRE:  Rate/Rhythm: 76 SR  BP:  Supine: 132/64  Sitting:   Standing:    SaO2: 99% 2L  MODE:  Ambulation: 300 ft   POST:  Rate/Rhythm: 95 SR  BP:  Supine:   Sitting: 153/74  Standing:    SaO2: 95%2L hall 94-99% in room 1115-1145  Pt ready to walk. Pt walked 300 ft on 2L with rolling walker and minimal asst. Some DOE noted but sats good on 2L. To recliner after walk. Call bell in reach. Tolerated well.   Graylon Good, RN BSN  12/16/2016 11:41 AM

## 2016-12-16 NOTE — Progress Notes (Signed)
Patient's pacing wires were removed without difficulty.  Will continue to monitor.

## 2016-12-16 NOTE — Progress Notes (Signed)
Patient placed on CPAP without complication.  

## 2016-12-17 LAB — GLUCOSE, CAPILLARY
GLUCOSE-CAPILLARY: 87 mg/dL (ref 65–99)
Glucose-Capillary: 96 mg/dL (ref 65–99)
Glucose-Capillary: 112 mg/dL — ABNORMAL HIGH (ref 65–99)
Glucose-Capillary: 111 mg/dL — ABNORMAL HIGH (ref 65–99)

## 2016-12-17 LAB — PROTIME-INR
INR: 1.55
PROTHROMBIN TIME: 18.7 s — AB (ref 11.4–15.2)

## 2016-12-17 MED ORDER — FUROSEMIDE 10 MG/ML IJ SOLN: 40.0000 mg | Freq: Once | INTRAMUSCULAR | Status: DC

## 2016-12-17 MED ORDER — WARFARIN SODIUM 2.5 MG PO TABS
2.5000 mg | ORAL_TABLET | Freq: Every day | ORAL | Status: DC
Start: 2016-12-17 — End: 2016-12-18
  Administered 2016-12-17: 2.5 mg via ORAL
  Filled 2016-12-17: qty 1

## 2016-12-17 MED ORDER — LISINOPRIL 2.5 MG PO TABS
2.5000 mg | ORAL_TABLET | Freq: Every day | ORAL | Status: DC
  Administered 2016-12-17 – 2016-12-18 (×2): 2.5 mg via ORAL
  Filled 2016-12-17 (×2): qty 1

## 2016-12-17 MED ORDER — WARFARIN VIDEO
Freq: Once | Status: AC
  Administered 2016-12-17: 14:00:00

## 2016-12-17 MED ORDER — FUROSEMIDE 40 MG PO TABS
40.0000 mg | ORAL_TABLET | Freq: Two times a day (BID) | ORAL | Status: DC
  Administered 2016-12-17 – 2016-12-18 (×2): 40 mg via ORAL
  Filled 2016-12-17 (×2): qty 1

## 2016-12-17 MED ORDER — FUROSEMIDE 40 MG PO TABS: 40.0000 mg | ORAL_TABLET | Freq: Two times a day (BID) | ORAL | Status: DC

## 2016-12-17 MED ORDER — PATIENT'S GUIDE TO USING COUMADIN BOOK
Freq: Once | Status: AC
  Administered 2016-12-17: 14:00:00
  Filled 2016-12-17: qty 1

## 2016-12-17 NOTE — Progress Notes (Signed)
CARDIAC REHAB PHASE I   PRE:  Rate/Rhythm: 98 SR  BP:  Supine:   Sitting: 131/56  Standing:    SaO2: 98%RA  MODE:  Ambulation: 350 ft   POST:  Rate/Rhythm: 110 ST  BP:  Supine:   Sitting: 139/63  Standing:    SaO2: 91-94%RA 1105-1125 Pt walked 350 ft on RA with rolling walker and minimal asst. Tolerated well even though a little SOB. Sats good on RA. Encouraged pursed-lip breathing and IS. To recliner after walk.  Second walk today.   Graylon Good, RN BSN  12/17/2016 11:21 AM

## 2016-12-17 NOTE — Progress Notes (Addendum)
      [address]       [place] [address]             [phone]      7 Days Post-Op Procedure(s) (LRB): MITRAL VALVE REPAIR (MVR) (N/A) TRICUSPID VALVE REPAIR (N/A) MAZE (N/A) TRANSESOPHAGEAL ECHOCARDIOGRAM (TEE) (N/A) Subjective: No issues overnight. Able to walk more independently.   Objective: Vital signs in last 24 hours: Temp:  [97.8 F (36.6 C)-99.3 F (37.4 C)] 99.3 F (37.4 C) (02/08 0529) Pulse Rate:  [71-82] 77 (02/08 0529) Cardiac Rhythm: Normal sinus rhythm (02/07 1900) Resp:  [18] 18 (02/08 0529) BP: (121-140)/(50-68) 140/56 (02/08 0529) SpO2:  [93 %-96 %] 93 % (02/08 0529) Weight:  [104.3 kg (230 lb)] 104.3 kg (230 lb) (02/08 0529)     Intake/Output from previous day: No intake/output data recorded. Intake/Output this shift: No intake/output data recorded.  General appearance: alert, cooperative and no distress Heart: regular rate and rhythm, S1, S2 normal, no murmur, click, rub or gallop Lungs: CTA bilaterally. Diminished in the lower lobes Abdomen: soft, non-tender; bowel sounds normal; no masses,  no organomegaly Extremities: extremities normal, atraumatic, no cyanosis or edema Wound: clean and dry  Lab Results:  Recent Labs  12/15/16 0609 12/16/16 0250  WBC 9.8 10.4  HGB 7.8* 10.0*  HCT 23.9* 30.4*  PLT 170 179   BMET:  Recent Labs  12/15/16 0609 12/16/16 0250  NA 134* 137  K 3.5 4.9  CL 88* 93*  CO2 34* 34*  GLUCOSE 96 87  BUN 22* 20  CREATININE 0.79 0.82  CALCIUM 8.6* 8.7*    PT/INR:  Recent Labs  12/17/16 0331  LABPROT 18.7*  INR 1.55   ABG    Component Value Date/Time   PHART 7.350 12/11/2016 0458   HCO3 23.8 12/11/2016 0458   TCO2 29 12/12/2016 1628   ACIDBASEDEF 2.0 12/11/2016 0458   O2SAT 75.7 12/12/2016 0408   CBG (last 3)   Recent Labs  12/16/16 1615 12/16/16 2044 12/17/16 0645  GLUCAP 114* 125* 96    Assessment/Plan: S/P Procedure(s) (LRB): MITRAL VALVE REPAIR (MVR)  (N/A) TRICUSPID VALVE REPAIR (N/A) MAZE (N/A) TRANSESOPHAGEAL ECHOCARDIOGRAM (TEE) (N/A)  CV-NSR in the 80s. Good Bp. On Amio Po, and Toprol Xl.  Pulm-On 2L [place] without issue. CPAP at night CXR shows small bilateral pleural effusions. Encourage incentive spirometry/pulm toilet Renal-creatinine 0.82. Change Lasix to PO in preparation for discharge. Good urine output. Weight continues to be stable. H and H- expected post-op anemia. Improving. Endo-well controlled blood glucose level Anticoagulation-Coumadin 2mg  daily. INR 1.55  Plan: Continue to mobilize. Encourage incentive spirometry. Wean oxygen as tolerated. Possibly home tomorrow.    LOS: 7 days    Elgie Collard 12/17/2016  I have seen and examined the patient and agree with the assessment and plan as outlined.  Tentatively plan d/c home tomorrow.  Will increase coumadin 2.5 mg/day.  Start low dose ACE-I.  Continue lasix BID and give this afternoon's dose IV instead of oral.  [name], MD 12/17/2016 8:32 AM

## 2016-12-17 NOTE — Discharge Summary (Signed)
Physician Discharge Summary  Patient ID: Kayla Hopkins MRN: [id number] DOB/AGE: October 06, 1961 56 y.o.  Admit date: 12/10/2016 Discharge date: 12/18/2016  Admission Diagnoses: Patient Active Problem List   Diagnosis Date Noted  . Mitral regurgitation   . Tricuspid regurgitation   . Obstructive sleep apnea   . Chronic diastolic CHF (congestive heart failure) ([place])   . Persistent atrial fibrillation ([place])   . Chest pressure 02/10/2016  . Tobacco use 02/10/2016  . BPPV (benign paroxysmal positional vertigo) 11/19/2015  . History of smoking 10/28/2015  . 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 Intractable migraine with aura without status migrainosus 03/18/2015  . Medication overuse headache 03/18/2015  . Joint pain 01/24/2015  . SOB (shortness of breath) on exertion 01/24/2015  . Non-cardiac chest pain 03/09/2014  . Postlaminectomy syndrome, lumbar region 01/24/2013  . Peripheral vascular disease ([place])   . HTN (hypertension) 06/26/2011  . Hyperlipidemia 06/26/2011    Discharge Diagnoses:  Principal Problem:   S/P mitral valve repair + tricuspid valve repair + maze procedure Active Problems:   HTN (hypertension)   SOB (shortness of breath) on exertion   Persistent atrial fibrillation (H[facility])   Mitral regurgitation   Tricuspid regurgitation   Obstructive sleep apnea   Chronic diastolic CHF (congestive heart failure) (H[facility])   S/P tricuspid valve repair   S/P Maze operation for atrial fibrillation   Discharged Condition: good  HPI:  Patient is a 56 year old morbidly obese female with history of persistent atrial fibrillation, hypertension, obstructive sleep apnea, peripheral arterial disease, post traumatic stress disorder, previous history of long-standing tobacco abuse, and recently discovered mitral regurgitation and tricuspid regurgitation who has been referred for surgical consultation to discuss treatment options for management of atrial fibrillation and  symptoms of congestive heart failure. The patient's history of atrial fibrillation dates back more than 5 years ago when she first presented with symptoms of palpitations and was diagnosed with paroxysmal atrial fibrillation. She was started on Pradaxa and followed for several years by Dr. Acie Fredrickson. In 2010 she underwent bilateral common iliac artery stent placement by Dr. Trula Slade for bilateral hip claudication. She initially did well but later developed recurrent claudication and was referred to Dr. Fletcher Anon. 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 She has been followed intermittently ever since by Dr. Fletcher Anon. In November 2016 the patient underwent hysterectomy. She developed atypical chest pain and worsening shortness breath and was later found to be in persistent atrialfibrillation. An echocardiogram was performed demonstrating ejection fraction estimated 55-60% with dilated left atrium and what was felt to be mild mitral regurgitation. Stress Myoview exam was felt to be low risk for ischemia. Systemic anticoagulation was resumed using Eliquis and medications were added for rate control. Patient developed worsening shortness of breath, PND, orthopnea, and lower extremity edema. She was seen in follow-up by Dr. Fletcher Anon and underwent an attempted cardioversion that was unsuccessful. She was referred to the atrial fibrillation clinic. She underwent a sleep study that confirmed the presence of obstructive sleep apnea and was started on CPAP. She noted some improvement in her ability to sleep, but she continued to experience worsening shortness of breath, palpitations and episodes of PND. She was started on flecainide and underwent a second attempt at cardioversion which failed. She was seen in follow-up in the atrial fibrillation clinic by Dr. Rayann Heman discussed the possibility of catheter-based ablation. TEE was performed  10/13/2016 revealed the presence of moderate mitral regurgitation  and severe tricuspid regurgitation. Plans for ablation were canceled and the patient was referred for surgical consultation.  She was originally seen in consultation on 10/26/2016. Since then she has been seen in follow-up by Ignacia Bayley at [facility] who noted that the patient had acute exacerbation of chronic diastolic congestive heart failure and prescribed lasix. In addition the patient was seen in consultation by Dr. Lawana Chambers who performed dental extraction and cleared the patient for surgery. More recently the patient was seen in follow up byDr. Fletcher Anon who performed left and right heart catheterization yesterday. The patient was found to have mild nonobstructive coronary artery disease with normal left ventricular systolic function. The patient was noted to have at least moderate mitral regurgitation with mildly elevated left ventricular end-diastolic pressure. The patient returns to the office today to discuss treatment options further. She reports that she is feeling a little better since she was started on Lasix, but she still gets short of breath with very mild low level activity. She has not had any chest pain or chest tightness. She reports no new problems or complaints.  The patient is married and lives with her husband in [place]. She has been disabled since 2002 secondary to post traumatic stress disorder caused by an on-the-job injury shesuffered in 2002. She states that up until approximately one year ago she remained reasonably active physically. Over the past year she has developed progressive symptoms of exertional shortness of breath, PND, orthopnea, and lower extremity edema. She complains of chronic pain in both feet which she blames on the swelling in her feet. Pain is described as sharp pain in sensations of "pins and needles" that is unrelated to ambulation. She also has pain in both hands which she attributes  to swelling. She has gained approximately 40 pounds over the past year which she feels is primarily due to accumulation of fluid. She denies any history of chest pain or chest tightness either with activity or at rest. She uses CPAP at night and this does seem to help her sleep but she still cannot lay flat in bed and she has occasional episodes of shortness of breath that wakes her from her sleep gasping. She has had some abdominal bloating and vague discomfort although her bowel function is normal, appetite is stable, and she has no difficulty swallowing. She has a history of long-standing tobacco abuse although she states that she quit smoking completely last March.  Hospital Course:  Mr. Bartnik underwent a mitral valve repair, tricuspid valve repair, and a maze procedure by Dr. Darylene Price on 12/10/2016. She tolerated the procedure well and was transferred to the ICU. She was extubated in a timely manner. Postop day 1 she continued to progress. She was requiring 15 L nasal cannula. She was requiring low-dose milrinone and amiodarone. We continued to atrial paced. We initiated a Lasix drip for fluid overload. We've restarted amiodarone for her rhythm. We continued her chest tubes for increased output. On postoperative day 2 we were able to wean milrinone. We continued her chest tubes. We initiated Coumadin therapy. We started to mobilize the patient. On postop day 3 we able to remove the patient's chest tubes. She was maintaining sinus rhythm with the pacer. She began to ambulate in the halls. We removed her central line and transitioned her Lasix drip to twice a day IV Lasix. On postop day 4 she continued to progress.  Her weight continued to trend down with the use of diuretic therapy. On postop day 5  we transfuse with 2 units packed red blood cells due to acute blood loss anemia with increased oxygen requirement. We were able to transfer her to to [place] for continued care. On postop day 6 we discontinued  her epicardial pacing wires since rhythm had been stable on amiodarone and metoprolol XL. Her creatinine remains stable. She did remain on 2 L nasal cannula however we are able to wean over time. She continued to milligrams of Coumadin and her INR continued to climb. On postop day 7 we continued to diurese due to continued fluid overload. We continued to wean her oxygen. She increased ambulation and became much more independent. Today she is ambulating with limited assistance, tolerating room air, her incisions are healing well, and she is ready for discharge home. She will need to follow-up with her PCP since she is prediabetic with an A1C of 6.1. She will need to have her INR drawn early next week with Dr. Tyrell Antonio office.   Consults: None  Significant Diagnostic Studies:   CLINICAL DATA:  Shortness of Breath  EXAM: CHEST  2 VIEW  COMPARISON:  December 14, 2016  FINDINGS: There is fairly mild underlying interstitial edema, similar to 1 day prior. The apparent atelectasis has cleared from the right base. On the lateral view, there are small pleural effusions. No new opacity is evident. Heart is upper normal in size with mild pulmonary venous hypertension. No evident adenopathy. Patient is status post mitral valve replacement and left atrial appendage clamp placement. No pneumothorax. No adenopathy. No bone lesions. Temporary pacemaker wires are attached to the right heart.  IMPRESSION: There is a degree of underlying interstitial edema with equivocal pleural effusions bilaterally. No airspace consolidation. Stable cardiac silhouette. There is a degree of pulmonary venous hypertension. No pneumothorax. No new opacity.   Electronically Signed   By: Lowella Grip III M.D.   On: 12/15/2016 08:28  Treatments:  Date of Procedure:                12/10/2016  Preoperative Diagnosis:        Moderate Mitral Regurgitation  Severe Tricuspid Regurgitation  Longstanding Persistent  Atrial Fibrillation  Postoperative Diagnosis:    Same  Procedure:       Mitral Valve Repair             Sorin Carbomedics Annuloflex posterior annuloplasty band (size 60mm, ref #[id number], serial [id number])   Tricuspid Valve Repair             Edwards mc3 ring annuloplasty (size 42mm, model #4900, serial [id number])   Maze Procedure              complete bilateral atrial lesion set using bipolar radiofrequency and cryothermy ablation             clipping of left atrial appendage (Atriclip size 45mm)              Surgeon:        Valentina Gu. Roxy Manns, MD  Assistant:       John Giovanni, PA-C and Brigid Re, PA-S  Anesthesia:    Roberts Gaudy, MD  Operative Findings:  Mild sclerosis of the mitral valve with normal leaflet mobility, relatively small leaflets, and annular dilatation  Type I mitral valve dysfunction with moderate (3+) mitral regurgitation  Normal LV systolic function (EF [id number])  Moderate pulmonary hypertension  Type I tricuspid valve dysfunction with severe (4+) tricuspid regurgitation  No residual mitral regurgitation after posterior annuloplasty band  No residual  tricuspid regurgitation after ring annuloplasty  Discharge Exam: Blood pressure 123/70, pulse 79, temperature 98 F (36.7 C), temperature source Oral, resp. rate 18, height 5\' 6"  (1.676 m), weight 100.1 kg (220 lb 9.6 oz), last menstrual period 11/09/2008, SpO2 96 %.   General appearance: alert, cooperative and no distress Heart: regular rate and rhythm, S1, S2 normal, no murmur, click, rub or gallop Lungs: clear to auscultation bilaterally Abdomen: soft, non-tender; bowel sounds normal; no masses,  no organomegaly Extremities: extremities normal, atraumatic, no cyanosis or edema Wound: clean and dry    Disposition: 01-Home or Self Care  Discharge Instructions    Discharge patient    Complete by:  As directed    Discharge disposition:  01-Home or Self Care   Discharge patient date:   12/18/2016     Allergies as of 12/18/2016      Reactions   Pradaxa [dabigatran Etexilate Mesylate] Other (See Comments)   Gastritis   Zocor [simvastatin] Other (See Comments)   MYALGIAS LEG PAINS      Medication List    STOP taking these medications   apixaban 5 MG Tabs tablet Commonly known as:  ELIQUIS   diltiazem 180 MG 24 hr capsule Commonly known as:  CARDIZEM CD   Fish Oil 1200 MG Caps     TAKE these medications   acetaminophen 500 MG tablet Commonly known as:  TYLENOL Take 1,000 mg by mouth every 6 (six) hours as needed for mild pain.   amiodarone 200 MG tablet Commonly known as:  PACERONE Take 1 tablet (200 mg total) by mouth 2 (two) times daily.   aspirin 81 MG EC tablet Take 1 tablet (81 mg total) by mouth daily.   atorvastatin 40 MG tablet Commonly known as:  LIPITOR Take 1 tablet (40 mg total) by mouth daily. What changed:  when to take this   esomeprazole 20 MG capsule Commonly known as:  NEXIUM Take 20 mg by mouth at bedtime.   furosemide 40 MG tablet Commonly known as:  LASIX Take 1 tablet (40 mg total) by mouth daily.   iron polysaccharides 150 MG capsule Commonly known as:  NIFEREX Take 1 capsule (150 mg total) by mouth daily.   lisinopril 2.5 MG tablet Commonly known as:  PRINIVIL,ZESTRIL Take 1 tablet (2.5 mg total) by mouth daily.   metoprolol succinate 25 MG 24 hr tablet Commonly known as:  TOPROL-XL Take 1 tablet (25 mg total) by mouth daily. What changed:  medication strength  how much to take  additional instructions   oxyCODONE 5 MG immediate release tablet Commonly known as:  Oxy IR/ROXICODONE Take 1 tablet (5 mg total) by mouth every 4 (four) hours as needed for severe pain.   potassium chloride SA 20 MEQ tablet Commonly known as:  K-DUR,KLOR-CON Take 1 tablet (20 mEq total) by mouth daily.   warfarin 4 MG tablet Commonly known as:  COUMADIN Take 1 tablet (4 mg total) by mouth daily at 6 PM.      Follow-up  Information    [name], MD Follow up.   Specialty:  Cardiothoracic Surgery Why:  Your appointment is on 01/11/2017 at 10:30am. Please arrive at 10:00am for a chest xray at [facility] located on the first level of our building.  Contact information: [address] [place] [address] [phone]        Kathlyn Sacramento, MD. Call in 1 day(s).   Specialty:  Cardiology Contact information: [address] [place] [address] [phone]  Your appointment is at the [facility] office on 12/21/2016 at 3:30pm for an INR check. Results to Dr. Fletcher Anon for dosing.         Hoyt Koch, MD. Call in 1 day(s).   Specialty:  Internal Medicine Contact information: [place] [address] [phone]          The patient has been discharged on:   1.Beta Blocker:  Yes [ x  ]                              No   [   ]                              If No, reason:  2.Ace Inhibitor/ARB: Yes [ x  ]                                     No  [    ]                                     If No, reason:  3.Statin:   Yes [  x ]                  No  [   ]                  If No, reason:  4.Ecasa:  Yes  [ x  ]                  No   [   ]                  If No, reason:   Signed: Elgie Collard 12/18/2016, 8:17 AM

## 2016-12-17 NOTE — Progress Notes (Signed)
Placed patient on CPAP for the night at [facility]

## 2016-12-18 LAB — PROTIME-INR
INR: 1.52
PROTHROMBIN TIME: 18.4 s — AB (ref 11.4–15.2)

## 2016-12-18 LAB — GLUCOSE, CAPILLARY: GLUCOSE-CAPILLARY: 130 mg/dL — AB (ref 65–99)

## 2016-12-18 MED ORDER — METOPROLOL SUCCINATE ER 25 MG PO TB24: 25.0000 mg | ORAL_TABLET | Freq: Every day | ORAL | 1 refills | Status: AC

## 2016-12-18 MED ORDER — WARFARIN SODIUM 4 MG PO TABS: 4.0000 mg | ORAL_TABLET | Freq: Every day | ORAL | 1 refills | Status: DC

## 2016-12-18 MED ORDER — ASPIRIN 81 MG PO TBEC: 81.0000 mg | DELAYED_RELEASE_TABLET | Freq: Every day | ORAL | Status: DC

## 2016-12-18 MED ORDER — POLYSACCHARIDE IRON COMPLEX 150 MG PO CAPS: 150.0000 mg | ORAL_CAPSULE | Freq: Every day | ORAL | 1 refills | Status: AC

## 2016-12-18 MED ORDER — OXYCODONE HCL 5 MG PO TABS: 5.0000 mg | ORAL_TABLET | ORAL | 0 refills | Status: DC | PRN

## 2016-12-18 MED ORDER — POTASSIUM CHLORIDE CRYS ER 20 MEQ PO TBCR: 20.0000 meq | EXTENDED_RELEASE_TABLET | Freq: Every day | ORAL | 1 refills | Status: DC

## 2016-12-18 MED ORDER — LISINOPRIL 2.5 MG PO TABS: 2.5000 mg | ORAL_TABLET | Freq: Every day | ORAL | 1 refills | Status: DC

## 2016-12-18 MED ORDER — COUMADIN BOOK
Freq: Once | Status: DC
  Filled 2016-12-18: qty 1

## 2016-12-18 NOTE — Progress Notes (Signed)
Pt. Discharged to home  Pt. D/C'd via wheel chair with volunteers Discharge information reviewed and given All personal belongings given to Pt.  Education discussed with teach back and Rx given IV was d/c Tele d/c

## 2016-12-18 NOTE — Progress Notes (Addendum)
      [address]       [place] [address]             [phone]      8 Days Post-Op Procedure(s) (LRB): MITRAL VALVE REPAIR (MVR) (N/A) TRICUSPID VALVE REPAIR (N/A) MAZE (N/A) TRANSESOPHAGEAL ECHOCARDIOGRAM (TEE) (N/A) Subjective: No issue this morning  Objective: Vital signs in last 24 hours: Temp:  [98 F (36.7 C)-98.5 F (36.9 C)] 98 F (36.7 C) (02/09 0728) Pulse Rate:  [79-94] 79 (02/09 0728) Cardiac Rhythm: Normal sinus rhythm (02/08 1900) Resp:  [18] 18 (02/09 0728) BP: (120-154)/(54-70) 123/70 (02/09 0728) SpO2:  [91 %-100 %] 96 % (02/09 0728) Weight:  [100.1 kg (220 lb 9.6 oz)] 100.1 kg (220 lb 9.6 oz) (02/09 0728)     Intake/Output from previous day: 02/08 0701 - 02/09 0700 In: 240 [P.O.:240] Out: -  Intake/Output this shift: No intake/output data recorded.  General appearance: alert, cooperative and no distress Heart: regular rate and rhythm, S1, S2 normal, no murmur, click, rub or gallop Lungs: clear to auscultation bilaterally Abdomen: soft, non-tender; bowel sounds normal; no masses,  no organomegaly Extremities: extremities normal, atraumatic, no cyanosis or edema Wound: clean and dry   Lab Results:  Recent Labs  12/16/16 0250  WBC 10.4  HGB 10.0*  HCT 30.4*  PLT 179   BMET:  Recent Labs  12/16/16 0250  NA 137  K 4.9  CL 93*  CO2 34*  GLUCOSE 87  BUN 20  CREATININE 0.82  CALCIUM 8.7*    PT/INR:  Recent Labs  12/18/16 0235  LABPROT 18.4*  INR 1.52   ABG    Component Value Date/Time   PHART 7.350 12/11/2016 0458   HCO3 23.8 12/11/2016 0458   TCO2 29 12/12/2016 1628   ACIDBASEDEF 2.0 12/11/2016 0458   O2SAT 75.7 12/12/2016 0408   CBG (last 3)   Recent Labs  12/17/16 1653 12/17/16 2040 12/18/16 0650  GLUCAP 87 111* 130*    Assessment/Plan: S/P Procedure(s) (LRB): MITRAL VALVE REPAIR (MVR) (N/A) TRICUSPID VALVE REPAIR (N/A) MAZE (N/A) TRANSESOPHAGEAL ECHOCARDIOGRAM (TEE) (N/A)  CV-NSR  in the 70s. Good Bp. On Amio Po, and Toprol Xl.  Pulm-On room air without issue. CPAP at night. Encourage incentive spirometry/pulm toilet.  Renal-creatinine 0.82. Change Lasix to PO in preparation for discharge. Good urine output. Weight continues to be stable. H and H- expected post-op anemia. Improving. Endo-well controlled blood glucose level Anticoagulation-Coumadin 2.5mg  daily. INR 1.52  Plan: Discharge today.    LOS: 8 days    Elgie Collard 12/18/2016  I have seen and examined the patient and agree with the assessment and plan as outlined.  D/C home today.  Increase Coumadin 4 mg/day and decrease lasix to 40 mg/day  [name], MD 12/18/2016 8:00 AM

## 2016-12-18 NOTE — Progress Notes (Signed)
[id number] Education completed with pt who voiced understanding. Encouraged IS and sternal precautions. Discussed watching sodium and carbs since A1C at 6.1. Discussed CRP 2 and will refer to [facility] Phase 2. Put on discharge video and encouraged her to watch Coumadin video. Graylon Good RN BSN 12/18/2016 10:07 AM

## 2016-12-18 NOTE — Care Management Note (Signed)
Case Management Note Marvetta Gibbons RN, BSN Unit 2W-Case Manager [phone]  Patient Details  Name: Kayla Hopkins MRN: [id number] Date of Birth: 03-13-1961  Subjective/Objective:  Pt admitted s/p MVR/TVR/MAZE                  Action/Plan: PTA pt lived at home with husband, plan to return home with spouse- no CM needs noted for discharge  Expected Discharge Date:  12/18/16               Expected Discharge Plan:  Home/Self Care  In-House Referral:     Discharge planning Services  CM Consult  Post Acute Care Choice:  NA Choice offered to:  NA  DME Arranged:    DME Agency:     HH Arranged:    [place] Agency:     Status of Service:  Completed, signed off  If discussed at [employer] of Stay Meetings, dates discussed:    Additional Comments:  Dawayne [name], RN 12/18/2016, 10:53 AM

## 2016-12-18 NOTE — Care Management Important Message (Signed)
  Important Message  Patient Details  Name: Kayla Hopkins MRN: [id number] Date of Birth: [birth year]   Medicare Important Message Given:  Yes    Connelly [place] Abena 12/18/2016, 1:45 PM

## 2016-12-21 ENCOUNTER — Ambulatory Visit (INDEPENDENT_AMBULATORY_CARE_PROVIDER_SITE_OTHER): Payer: Medicare HMO | Admitting: Pharmacist

## 2016-12-21 DIAGNOSIS — Z9889 Other specified postprocedural states: Secondary | ICD-10-CM

## 2016-12-21 LAB — POCT INR: INR: 1.5

## 2016-12-23 ENCOUNTER — Telehealth: Payer: Self-pay | Admitting: Surgical

## 2016-12-23 NOTE — Telephone Encounter (Signed)
[address]       ,[place] [address]             [phone]    Nathasha S Antosh [id number]   S/P CARDIOTHORACIC SURGERY OPERATIVE NOTE  Date of Procedure:                12/10/2016  Preoperative Diagnosis:        Moderate Mitral Regurgitation  Severe Tricuspid Regurgitation  Longstanding Persistent Atrial Fibrillation  Postoperative Diagnosis:    Same  Procedure:       Mitral Valve Repair             Sorin Carbomedics Annuloflex posterior annuloplasty band (size 68mm, ref #[id number], serial [id number])   Tricuspid Valve Repair             Edwards mc3 ring annuloplasty (size 29mm, model #4900, serial [id number])   Maze Procedure              complete bilateral atrial lesion set using bipolar radiofrequency and cryothermy ablation             clipping of left atrial appendage (Atriclip size 77mm)              Surgeon:        Valentina Gu. Roxy Manns, MD  Assistant:       John Giovanni, PA-C and Brigid Re, PA-S  Anesthesia:    Roberts Gaudy, MD  Operative Findings:  Mild sclerosis of the mitral valve with normal leaflet mobility, relatively small leaflets, and annular dilatation  Type I mitral valve dysfunction with moderate (3+) mitral regurgitation  Normal LV systolic function (EF [id number])  Moderate pulmonary hypertension  Type I tricuspid valve dysfunction with severe (4+) tricuspid regurgitation  No residual mitral regurgitation after posterior annuloplasty band No residual tricuspid regurgitation after ring annuloplasty    Discharged home on 12/18/16  Medications: Allergies as of 12/23/2016      Reactions   Pradaxa [dabigatran Etexilate Mesylate] Other (See Comments)   Gastritis   Zocor [simvastatin] Other (See Comments)   MYALGIAS LEG PAINS      Medication List       Accurate as of 12/23/16  1:13 PM. Always use your most recent med list.          acetaminophen 500 MG tablet Commonly known as:  TYLENOL Take 1,000 mg by  mouth every 6 (six) hours as needed for mild pain.   amiodarone 200 MG tablet Commonly known as:  PACERONE Take 1 tablet (200 mg total) by mouth 2 (two) times daily.   aspirin 81 MG EC tablet Take 1 tablet (81 mg total) by mouth daily.   atorvastatin 40 MG tablet Commonly known as:  LIPITOR Take 1 tablet (40 mg total) by mouth daily.   esomeprazole 20 MG capsule Commonly known as:  NEXIUM Take 20 mg by mouth at bedtime.   furosemide 40 MG tablet Commonly known as:  LASIX Take 1 tablet (40 mg total) by mouth daily.   iron polysaccharides 150 MG capsule Commonly known as:  NIFEREX Take 1 capsule (150 mg total) by mouth daily.   lisinopril 2.5 MG tablet Commonly known as:  PRINIVIL,ZESTRIL Take 1 tablet (2.5 mg total) by mouth daily.   metoprolol succinate 25 MG 24 hr tablet Commonly known as:  TOPROL-XL Take 1 tablet (25 mg total) by mouth daily.   oxyCODONE 5 MG immediate release tablet Commonly known as:  Oxy IR/ROXICODONE Take  1 tablet (5 mg total) by mouth every 4 (four) hours as needed for severe pain.   potassium chloride SA 20 MEQ tablet Commonly known as:  K-DUR,KLOR-CON Take 1 tablet (20 mEq total) by mouth daily.   warfarin 4 MG tablet Commonly known as:  COUMADIN Take 1 tablet (4 mg total) by mouth daily at 6 PM.       Coumadin:  INR check Yes- dose increased per patient  Problems/Concerns:she does get a bit SOB with ambulation, none at rest.No other current complaints/concerns  Assessment:  Patient is doing well.   instructions  provided.  Contact office if concerns or problems develop  Follow up Appointment: scheduled

## 2016-12-25 ENCOUNTER — Encounter (INDEPENDENT_AMBULATORY_CARE_PROVIDER_SITE_OTHER): Payer: Self-pay

## 2016-12-25 DIAGNOSIS — Z9889 Other specified postprocedural states: Secondary | ICD-10-CM

## 2016-12-25 DIAGNOSIS — Z4802 Encounter for removal of sutures: Secondary | ICD-10-CM

## 2016-12-29 DIAGNOSIS — G4733 Obstructive sleep apnea (adult) (pediatric): Secondary | ICD-10-CM | POA: Diagnosis not present

## 2016-12-31 ENCOUNTER — Encounter: Payer: Self-pay | Admitting: Nurse Practitioner

## 2016-12-31 ENCOUNTER — Other Ambulatory Visit: Payer: Self-pay | Admitting: Nurse Practitioner

## 2016-12-31 ENCOUNTER — Telehealth: Payer: Self-pay | Admitting: Cardiology

## 2016-12-31 ENCOUNTER — Ambulatory Visit (HOSPITAL_COMMUNITY)
Admission: RE | Admit: 2016-12-31 | Discharge: 2016-12-31 | Disposition: A | Discharge status: Home / Self Care | Payer: Medicare HMO | Source: Ambulatory Visit | Attending: Nurse Practitioner | Admitting: Nurse Practitioner

## 2016-12-31 ENCOUNTER — Ambulatory Visit (INDEPENDENT_AMBULATORY_CARE_PROVIDER_SITE_OTHER): Payer: Medicare HMO | Admitting: Pharmacist

## 2016-12-31 ENCOUNTER — Ambulatory Visit (INDEPENDENT_AMBULATORY_CARE_PROVIDER_SITE_OTHER): Payer: Medicare HMO | Admitting: Nurse Practitioner

## 2016-12-31 VITALS — BP 142/74 | HR 87 | Ht 66.0 in | Wt 218.6 lb

## 2016-12-31 DIAGNOSIS — I340 Nonrheumatic mitral (valve) insufficiency: Secondary | ICD-10-CM

## 2016-12-31 DIAGNOSIS — J90 Pleural effusion, not elsewhere classified: Secondary | ICD-10-CM | POA: Diagnosis not present

## 2016-12-31 DIAGNOSIS — I10 Essential (primary) hypertension: Secondary | ICD-10-CM

## 2016-12-31 DIAGNOSIS — E782 Mixed hyperlipidemia: Secondary | ICD-10-CM | POA: Diagnosis not present

## 2016-12-31 DIAGNOSIS — I071 Rheumatic tricuspid insufficiency: Secondary | ICD-10-CM | POA: Diagnosis not present

## 2016-12-31 DIAGNOSIS — R0789 Other chest pain: Secondary | ICD-10-CM | POA: Diagnosis not present

## 2016-12-31 DIAGNOSIS — Z9889 Other specified postprocedural states: Secondary | ICD-10-CM

## 2016-12-31 DIAGNOSIS — J189 Pneumonia, unspecified organism: Secondary | ICD-10-CM | POA: Diagnosis not present

## 2016-12-31 DIAGNOSIS — I5032 Chronic diastolic (congestive) heart failure: Secondary | ICD-10-CM

## 2016-12-31 DIAGNOSIS — J811 Chronic pulmonary edema: Secondary | ICD-10-CM | POA: Diagnosis not present

## 2016-12-31 LAB — PROTIME-INR
INR: 11.1 — AB
Prothrombin Time: 105.5 s — ABNORMAL HIGH (ref 9.0–11.5)

## 2016-12-31 LAB — POCT INR: INR: 8

## 2016-12-31 MED ORDER — TRAMADOL HCL 50 MG PO TABS: 50.0000 mg | ORAL_TABLET | Freq: Four times a day (QID) | ORAL | 0 refills | Status: DC | PRN

## 2016-12-31 MED ORDER — DOXYCYCLINE HYCLATE 100 MG PO CAPS: 100.0000 mg | ORAL_CAPSULE | Freq: Two times a day (BID) | ORAL | 0 refills | Status: DC

## 2016-12-31 NOTE — Progress Notes (Signed)
d 

## 2016-12-31 NOTE — Telephone Encounter (Signed)
Called regarding pt INR level of 11 today. Was noted at 8 in the office. She has been instructed to hold her coumadin until Monday for repeat INR. CBC from today still pending. Instructed on the signs and symptoms/bleeding precautions. She will call with any concerns.   Reino Bellis NP

## 2016-12-31 NOTE — Patient Instructions (Signed)
Medication Instructions:   START TRAMADOL 50 MG ONE TABLET EVERY 6 HOURS  Labwork:  Your physician recommends that you HAVE LAB WORK TODAY  Testing/Procedures:  A chest x-ray takes a picture of the organs and structures inside the chest, including the heart, lungs, and blood vessels. This test can show several things, including, whether the heart is enlarges; whether fluid is building up in the lungs; and whether pacemaker / defibrillator leads are still in place. AT [facility]  Your physician has requested that you have an echocardiogram. Echocardiography is a painless test that uses sound waves to create images of your heart. It provides your doctor with information about the size and shape of your heart and how well your heart's chambers and valves are working. This procedure takes approximately one hour. There are no restrictions for this procedure.SCHEDULE IN ONE MONTH    Follow-Up:  Your physician recommends that you schedule a follow-up appointment WITH DR Fletcher Anon AFTER ECHO IS COMPLETE

## 2016-12-31 NOTE — Progress Notes (Signed)
Office Visit    Patient Name: Kayla Hopkins Date of Encounter: 12/31/2016  Primary Care Provider:  Hoyt Koch, MD Primary Cardiologist:  Jerilynn Mages. Fletcher Anon, MD / J. Allred, MD   Chief Complaint    56 year old female with a history of peripheral vascular disease, persistent atrial fibrillation, mitral and tricuspid valve disease, hypertension, hyperlipidemia, and chronic diastolic CHF, who presents following recent TVR/MVR and Maze.  Past Medical History    Past Medical History:  Diagnosis Date  . Anxiety   . Arthritis    "knees, back" (02/11/2016)  . Atrial fibrillation ([place])    takes Eliquis but on hold for surgery and now taking Amiodarone  . Bruises easily    d/t being on Eliquis  . Chronic back pain    stenosis  . Chronic diastolic CHF (congestive heart failure) ([place])    a. 02/2016 Echo; EF 55-60%, no rwma, mild to mod MR, mod dil LA, PASP 30mmHg;  b. 10/2016 TEE: nl EF, mod MR, mod-sev TR.  . Claudication ([place])       . Daily headache   . Depression   . GERD (gastroesophageal reflux disease)    takes Nexium nightly  . History of migraine    last one a couple of wks ago  . Hyperlipidemia    takes Atorvastatin daily  . Hypertension    takes Diltiazem and Diltiazem daily  . Migraine    "probably have 3/year; had one today; tried several medications-no relief" (02/11/2016)  . Moderate mitral regurgitation    a. 10/2016 TEE: nl EF, mod MR, mod to severe LAE, mod to sev TR.  . Moderate to Severe Tricuspid regurgitation    a. 10/2014 TEE: mod-sev TR.  [name] Obstructive sleep apnea    wears CPAP  . Peripheral vascular disease ([place]) 06/11/2009   a. 06/2009 Bilateral common iliac kissing stents (8X24 mm);  b. 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 She underwent successful balloon angioplasty;  c. Restenosis in RCIA in 12/13 s/p  Covered stent; d. 05/2013: 70% in distal left common iliac artery s/p self  expanding stent placement; e. 08/2016 ABI: R 1.1, L 0.99, nl Abd Ao.  [name] Persistent atrial fibrillation ([facility])    a. s/p prior DCCV x 2;  b. failed medical therapy-->poor candidate for PVI 2/2 mod MR and LAE.  [name] PTSD (post-traumatic stress disorder)   . S/P Maze operation for atrial fibrillation 12/10/2016   Complete bilateral atrial lesion set using cryothermy and bipolar radiofrequency ablation via median sternotomy with clipping of LA appendage  . S/P mitral valve repair 12/10/2016   26 mm Sorin Carbomedics Annuloflex posterior annuloplasty band  . S/P tricuspid valve repair 12/10/2016   26 mm Edwards mc3 ring annuloplasty  . Shortness of breath dyspnea    lying flat and with exertion  . Urinary frequency   . Urinary urgency   . Vertigo    Past Surgical History:  Procedure Laterality Date  . ABDOMINAL ANGIOGRAM N/A 05/11/2012   Procedure: ABDOMINAL ANGIOGRAM;  Surgeon: [name], MD;  Location: [place] CATH LAB;  Service: Cardiovascular;  Laterality: N/A;  . ABDOMINAL AORTAGRAM N/A 10/19/2012   Procedure: ABDOMINAL Maxcine Ham;  Surgeon: [name], MD;  Location: [place] CATH [facility];  Service: Cardiovascular;  Laterality: N/A;  . ABDOMINAL AORTAGRAM N/A 05/10/2013   Procedure: ABDOMINAL Maxcine Ham;  Surgeon: [name], MD;  Location: [place] CATH LAB;  Service: Cardiovascular;  Laterality:  N/A;  . BACK SURGERY    . CARDIAC CATHETERIZATION  2009   no significant obstruction  . CARDIAC CATHETERIZATION N/A 12/02/2016   Procedure: Right/Left Heart Cath and Coronary Angiography;  Surgeon: [name], MD;  Location: [place] CV LAB;  Service: Cardiovascular;  Laterality: N/A;  . CARDIOVERSION N/A 03/19/2016   Procedure: CARDIOVERSION;  Surgeon: Pixie Casino, MD;  Location: [facility] ENDOSCOPY;  Service: Cardiovascular;  Laterality: N/A;  . CARDIOVERSION N/A 07/10/2016   Procedure: CARDIOVERSION;  Surgeon: Pixie Casino, MD;  Location: [facility] ENDOSCOPY;  Service: Cardiovascular;  Laterality: N/A;  .  ILIAC ARTERY STENT Bilateral 06/2009   common iliac kissing stents (8X24 mm)  . KNEE ARTHROSCOPY Bilateral   . LUMBAR DISC SURGERY     spinal stenosis  . MAZE N/A 12/10/2016   Procedure: MAZE;  Surgeon: [name], MD;  Location: [place];  Service: Open Heart Surgery;  Laterality: N/A;  . MITRAL VALVE REPAIR N/A 12/10/2016   Procedure: MITRAL VALVE REPAIR (MVR);  Surgeon: [name], MD;  Location: [place];  Service: Open Heart Surgery;  Laterality: N/A;  . MULTIPLE EXTRACTIONS WITH ALVEOLOPLASTY N/A 11/12/2016   Procedure: EXTRACTION OF TOOTH #'S 18, 20-29, AND 31 WITH  ALVEOLOPLASTY AND BILATERAL MANDIBULAR TORI REDUCTIONS.;  Surgeon: Lenn Cal, DDS;  Location: [place];  Service: Oral Surgery;  Laterality: N/A;  . ROBOTIC ASSISTED TOTAL HYSTERECTOMY WITH SALPINGECTOMY Bilateral 09/23/2015   Procedure: ROBOTIC ASSISTED TOTAL HYSTERECTOMY BILATERALSALPINGECTOMY AND OOPHORECTOMY;  Surgeon: [name], MD;  Location: [place] [facility];  Service: Gynecology;  Laterality: Bilateral;  . TEE WITHOUT CARDIOVERSION N/A 10/13/2016   Procedure: TRANSESOPHAGEAL ECHOCARDIOGRAM (TEE);  Surgeon: Lelon Perla, MD;  Location: [facility] ENDOSCOPY;  Service: Cardiovascular;  Laterality: N/A;  . TEE WITHOUT CARDIOVERSION N/A 12/10/2016   Procedure: TRANSESOPHAGEAL ECHOCARDIOGRAM (TEE);  Surgeon: [name], MD;  Location: [place];  Service: Open Heart Surgery;  Laterality: N/A;  . TRICUSPID VALVE REPLACEMENT N/A 12/10/2016   Procedure: TRICUSPID VALVE REPAIR;  Surgeon: [name], MD;  Location: [place];  Service: Open Heart Surgery;  Laterality: N/A;  . TUBAL LIGATION  1991  . VULVECTOMY N/A 09/23/2015   Procedure: WIDE EXCISION VULVECTOMY  WLE of vulvar VIN 2;  Surgeon: [name], MD;  Location: [facility] [facility];  Service: Gynecology;  Laterality: N/A;    Allergies  Allergies  Allergen Reactions  . Pradaxa [Dabigatran Etexilate Mesylate] Other (See Comments)    Gastritis   . Zocor [Simvastatin] Other (See  Comments)    MYALGIAS LEG PAINS     History of Present Illness    56 y/o ? with the above complex PMH.  I recently saw her in clinic in early January 2/2 worsening dyspnea and edema in the setting of diast chf and TR/MR.  Lasix was started and she subsequently f/u with Dr. Fletcher Anon and Dr. Roxy Manns with plan for TVR/MVR, Maze, and left atrial appendage clipping.  This was performed 2/1.  Post-op course was relatively uncomplicated, though she did require 2 u prbc's on post-op day 5.  She was later d/c'd and since d/c, she has had significant chest wall pain and tenderness.  She has completed a course of prn oxycodone and has most recently been taking tylenol 500 mg up to once an hour in the past few days.  Her surgical incisions have been healing well, but she notes severe pain @ rest that is exacerbated by mvmt of her upper body.  She denies  dyspnea, pnd, orthopnea, n, v, dizziness, syncope, edema, or early satiety.  Home Medications    Prior to Admission medications   Medication Sig Start Date End Date Taking? Authorizing Provider  acetaminophen (TYLENOL) 500 MG tablet Take 1,000 mg by mouth every 6 (six) hours as needed for mild pain.   Yes Historical Provider, MD  amiodarone (PACERONE) 200 MG tablet Take 1 tablet (200 mg total) by mouth 2 (two) times daily. 12/03/16  Yes [name], MD  aspirin EC 81 MG EC tablet Take 1 tablet (81 mg total) by mouth daily. 12/18/16  Yes Elgie Collard, PA-C  atorvastatin (LIPITOR) 40 MG tablet Take 1 tablet (40 mg total) by mouth daily. Patient taking differently: Take 40 mg by mouth daily at 6 PM.  08/28/16  Yes Rhonda G Barrett, PA-C  esomeprazole (NEXIUM) 20 MG capsule Take 20 mg by mouth at bedtime.    Yes Historical Provider, MD  furosemide (LASIX) 40 MG tablet Take 1 tablet (40 mg total) by mouth daily. 11/13/16 02/11/17 Yes [name], MD  iron polysaccharides (NIFEREX) 150 MG capsule Take 1 capsule (150 mg total) by mouth daily. 12/18/16  Yes Elgie Collard, PA-C  lisinopril (PRINIVIL,ZESTRIL) 2.5 MG tablet Take 1 tablet (2.5 mg total) by mouth daily. 12/18/16  Yes Elgie Collard, PA-C  metoprolol succinate (TOPROL-XL) 25 MG 24 hr tablet Take 1 tablet (25 mg total) by mouth daily. 12/18/16  Yes Elgie Collard, PA-C  oxyCODONE (OXY IR/ROXICODONE) 5 MG immediate release tablet Take 1 tablet (5 mg total) by mouth every 4 (four) hours as needed for severe pain. 12/18/16  Yes Elgie Collard, PA-C  warfarin (COUMADIN) 4 MG tablet Take 1 tablet (4 mg total) by mouth daily at 6 PM. 12/18/16  Yes Elgie Collard, PA-C  doxycycline (VIBRAMYCIN) 100 MG capsule Take 1 capsule (100 mg total) by mouth 2 (two) times daily. 12/31/16   Rogelia Mire, NP  traMADol (ULTRAM) 50 MG tablet Take 1 tablet (50 mg total) by mouth every 6 (six) hours as needed. 12/31/16   Rogelia Mire, NP    Review of Systems    Chest wall pain as outlined above.  She denies palpitations, dyspnea, pnd, orthopnea, n, v, dizziness, syncope, edema, weight gain, or early satiety.  All other systems reviewed and are otherwise negative except as noted above.  Physical Exam    VS:  BP (!) 142/74   Pulse 87   Ht 5\' 6"  (1.676 m)   Wt 218 lb 9.6 oz (99.2 kg)   LMP 11/09/2008   BMI 35.28 kg/m  , BMI Body mass index is 35.28 kg/m. GEN: Well nourished, well developed, in no acute distress.  HEENT: normal.  Neck: Supple, no JVD, carotid bruits, or masses. Cardiac: RRR, 2/6 SEM RUSB, no rubs, or gallops. No clubbing, cyanosis, edema.  Radials/DP/PT 2+ and equal bilaterally. Midline surgical incision is well-healed without bleeding, erythema, discharge. Respiratory:  Respirations regular and unlabored, clear to auscultation bilaterally. GI: Soft, nontender, nondistended, BS + x 4. Drain sites are clean and dry w/o drainage, bleeding, or erythema. MS: no deformity or atrophy. Skin: warm and dry, no rash. Neuro:  Strength and sensation are intact. Psych: Normal affect.  Accessory Clinical  Findings    ECG - RSR, no acute st/t changes.  Assessment & Plan    1.  Mitral and Tricuspid regurgitation s/p MVR/TVR; Chest Wall Pain:  S/p recent surgery with relatively uncomplicated post-op course.  Her biggest complaint today is ongoing chest wall pain and soreness - to the point of crying in clinic.  She recently ran out of prn oxycodone and has been taking extra strength tylenol up to once an hour over the past few days.  I have provided her w/ a Rx for tramadol 50 mg q 6 hr prn pain, # 20, zero refills and cautioned against using tylenol in doses grossing more than 4g per day.  I will check LFT's today as she is also on coumadin and her INR was elevated in clinic @ 8.1.  Given the disproportionate amount of pain that she is having, I will also check a CXR to ensure sternal stability.  She has f/u in CT surgery clinic in early March.  Plan to f/u echo in about a month to establish new baseline post-op.  2.  Atrial Fibrillation s/p Maze:  Pt w/ prior h/o persistent afib.  She is now s/p Maze and left atrial appendage clipping.  She has been maintained on amio since surgery and remains in sinus rhythm.  She is also now on coumadin.  INR is supratherapeutic.  This is likely r/t both amio usage and probable tylenol overdosing.  I am checking LFT's, CBC, and lab based stat INR today.  She has been counseled by our pharmacist and advised to hold coumadin until further notice.  3.  Chronic diastolic CHF:  Euvolemic on exam.  HR/BP relatively stable considering the amount of pain that she seems to be in.  Cont current dose of lasix.  4.  Essential HTN:  BP mildly elevated.  She has been in a significant amt of pain.  Focus efforts on pain mgmt for now.  5.  HL:  Cont statin.  6.  Dispo:  F/u labs including cbc, lft's, INR today.  F/u cxr.  F/u echo in one month and Dr. Fletcher Anon shortly thereafter.  Further recs pending cxr.   Murray Hodgkins, NP 12/31/2016, 9:19 PM

## 2017-01-01 LAB — COMPREHENSIVE METABOLIC PANEL
ALT: 18 U/L (ref 6–29)
AST: 17 U/L (ref 10–35)
Albumin: 4 g/dL (ref 3.6–5.1)
Alkaline Phosphatase: 113 U/L (ref 33–130)
BUN: 22 mg/dL (ref 7–25)
CALCIUM: 9.6 mg/dL (ref 8.6–10.4)
CO2: 26 mmol/L (ref 20–31)
Chloride: 97 mmol/L — ABNORMAL LOW (ref 98–110)
Creat: 0.81 mg/dL (ref 0.50–1.05)
GLUCOSE: 102 mg/dL — AB (ref 65–99)
POTASSIUM: 4.5 mmol/L (ref 3.5–5.3)
Sodium: 139 mmol/L (ref 135–146)
Total Bilirubin: 0.3 mg/dL (ref 0.2–1.2)
Total Protein: 7.4 g/dL (ref 6.1–8.1)

## 2017-01-01 LAB — CBC
HCT: 33.8 % — ABNORMAL LOW (ref 35.0–45.0)
HEMOGLOBIN: 10.8 g/dL — AB (ref 11.7–15.5)
MCH: 28.3 pg (ref 27.0–33.0)
MCHC: 32 g/dL (ref 32.0–36.0)
MCV: 88.7 fL (ref 80.0–100.0)
MPV: 9 fL (ref 7.5–12.5)
Platelets: 660 10*3/uL — ABNORMAL HIGH (ref 140–400)
RBC: 3.81 MIL/uL (ref 3.80–5.10)
RDW: 15.2 % — ABNORMAL HIGH (ref 11.0–15.0)
WBC: 11.7 10*3/uL — ABNORMAL HIGH (ref 3.8–10.8)

## 2017-01-01 MED ORDER — PHYTONADIONE 5 MG PO TABS: 5.0000 mg | ORAL_TABLET | Freq: Once | ORAL | 0 refills | Status: AC

## 2017-01-04 ENCOUNTER — Ambulatory Visit (INDEPENDENT_AMBULATORY_CARE_PROVIDER_SITE_OTHER): Payer: Medicare HMO | Admitting: Pharmacist Clinician (PhC)/ Clinical Pharmacy Specialist

## 2017-01-04 DIAGNOSIS — Z9889 Other specified postprocedural states: Secondary | ICD-10-CM

## 2017-01-04 LAB — POCT INR: INR: 2.8

## 2017-01-05 ENCOUNTER — Encounter (HOSPITAL_COMMUNITY): Payer: Self-pay | Admitting: Family Medicine

## 2017-01-05 ENCOUNTER — Inpatient Hospital Stay (HOSPITAL_COMMUNITY)
Admission: AD | Admit: 2017-01-05 | Discharge: 2017-01-07 | DRG: 948 | Disposition: A | Discharge status: Home / Self Care | Payer: Medicare HMO | Source: Other Acute Inpatient Hospital | Attending: Internal Medicine | Admitting: Internal Medicine

## 2017-01-05 ENCOUNTER — Inpatient Hospital Stay: Admit: 2017-01-05 | Payer: Self-pay | Admitting: Family Medicine

## 2017-01-05 ENCOUNTER — Telehealth: Payer: Self-pay

## 2017-01-05 DIAGNOSIS — D508 Other iron deficiency anemias: Secondary | ICD-10-CM | POA: Diagnosis not present

## 2017-01-05 DIAGNOSIS — G8918 Other acute postprocedural pain: Principal | ICD-10-CM | POA: Diagnosis present

## 2017-01-05 DIAGNOSIS — Z9889 Other specified postprocedural states: Secondary | ICD-10-CM | POA: Diagnosis not present

## 2017-01-05 DIAGNOSIS — Z87891 Personal history of nicotine dependence: Secondary | ICD-10-CM | POA: Diagnosis not present

## 2017-01-05 DIAGNOSIS — F4310 Post-traumatic stress disorder, unspecified: Secondary | ICD-10-CM | POA: Diagnosis present

## 2017-01-05 DIAGNOSIS — R0789 Other chest pain: Secondary | ICD-10-CM | POA: Diagnosis present

## 2017-01-05 DIAGNOSIS — Z9981 Dependence on supplemental oxygen: Secondary | ICD-10-CM | POA: Diagnosis not present

## 2017-01-05 DIAGNOSIS — D509 Iron deficiency anemia, unspecified: Secondary | ICD-10-CM | POA: Diagnosis present

## 2017-01-05 DIAGNOSIS — M79609 Pain in unspecified limb: Secondary | ICD-10-CM | POA: Diagnosis not present

## 2017-01-05 DIAGNOSIS — F329 Major depressive disorder, single episode, unspecified: Secondary | ICD-10-CM | POA: Diagnosis present

## 2017-01-05 DIAGNOSIS — I4819 Other persistent atrial fibrillation: Secondary | ICD-10-CM | POA: Diagnosis present

## 2017-01-05 DIAGNOSIS — I509 Heart failure, unspecified: Secondary | ICD-10-CM

## 2017-01-05 DIAGNOSIS — J90 Pleural effusion, not elsewhere classified: Secondary | ICD-10-CM | POA: Diagnosis present

## 2017-01-05 DIAGNOSIS — Z9989 Dependence on other enabling machines and devices: Secondary | ICD-10-CM

## 2017-01-05 DIAGNOSIS — I2699 Other pulmonary embolism without acute cor pulmonale: Secondary | ICD-10-CM | POA: Diagnosis present

## 2017-01-05 DIAGNOSIS — I5032 Chronic diastolic (congestive) heart failure: Secondary | ICD-10-CM | POA: Diagnosis not present

## 2017-01-05 DIAGNOSIS — I2510 Atherosclerotic heart disease of native coronary artery without angina pectoris: Secondary | ICD-10-CM | POA: Diagnosis present

## 2017-01-05 DIAGNOSIS — R05 Cough: Secondary | ICD-10-CM | POA: Diagnosis not present

## 2017-01-05 DIAGNOSIS — I739 Peripheral vascular disease, unspecified: Secondary | ICD-10-CM | POA: Diagnosis present

## 2017-01-05 DIAGNOSIS — I481 Persistent atrial fibrillation: Secondary | ICD-10-CM

## 2017-01-05 DIAGNOSIS — I10 Essential (primary) hypertension: Secondary | ICD-10-CM | POA: Diagnosis present

## 2017-01-05 DIAGNOSIS — K219 Gastro-esophageal reflux disease without esophagitis: Secondary | ICD-10-CM | POA: Diagnosis present

## 2017-01-05 DIAGNOSIS — E785 Hyperlipidemia, unspecified: Secondary | ICD-10-CM | POA: Diagnosis present

## 2017-01-05 DIAGNOSIS — G4733 Obstructive sleep apnea (adult) (pediatric): Secondary | ICD-10-CM

## 2017-01-05 DIAGNOSIS — I340 Nonrheumatic mitral (valve) insufficiency: Secondary | ICD-10-CM | POA: Diagnosis not present

## 2017-01-05 DIAGNOSIS — R079 Chest pain, unspecified: Secondary | ICD-10-CM | POA: Diagnosis not present

## 2017-01-05 DIAGNOSIS — R0602 Shortness of breath: Secondary | ICD-10-CM | POA: Diagnosis not present

## 2017-01-05 DIAGNOSIS — Z7901 Long term (current) use of anticoagulants: Secondary | ICD-10-CM | POA: Diagnosis not present

## 2017-01-05 DIAGNOSIS — Z952 Presence of prosthetic heart valve: Secondary | ICD-10-CM | POA: Diagnosis not present

## 2017-01-05 DIAGNOSIS — I110 Hypertensive heart disease with heart failure: Secondary | ICD-10-CM | POA: Diagnosis present

## 2017-01-05 DIAGNOSIS — I482 Chronic atrial fibrillation: Secondary | ICD-10-CM | POA: Diagnosis not present

## 2017-01-05 DIAGNOSIS — F419 Anxiety disorder, unspecified: Secondary | ICD-10-CM | POA: Diagnosis present

## 2017-01-05 DIAGNOSIS — J9811 Atelectasis: Secondary | ICD-10-CM

## 2017-01-05 DIAGNOSIS — J810 Acute pulmonary edema: Secondary | ICD-10-CM | POA: Diagnosis not present

## 2017-01-05 LAB — TROPONIN I: Troponin I: 0.04 ng/mL (ref <0.03)

## 2017-01-05 LAB — BRAIN NATRIURETIC PEPTIDE: B Natriuretic Peptide: 193.5 pg/mL — ABNORMAL HIGH (ref 0.0–100.0)

## 2017-01-05 MED ORDER — HEPARIN (PORCINE) IN NACL 100-0.45 UNIT/ML-% IJ SOLN
1300.0000 [IU]/h | INTRAMUSCULAR | Status: DC
  Administered 2017-01-05: 1150 [IU]/h via INTRAVENOUS
  Filled 2017-01-05: qty 250

## 2017-01-05 MED ORDER — FUROSEMIDE 40 MG PO TABS
40.0000 mg | ORAL_TABLET | Freq: Every day | ORAL | Status: DC
  Administered 2017-01-06 – 2017-01-07 (×2): 40 mg via ORAL
  Filled 2017-01-05 (×2): qty 1

## 2017-01-05 MED ORDER — SODIUM CHLORIDE 0.9% FLUSH
3.0000 mL | Freq: Two times a day (BID) | INTRAVENOUS | Status: DC
  Administered 2017-01-05 – 2017-01-06 (×2): 3 mL via INTRAVENOUS

## 2017-01-05 MED ORDER — LISINOPRIL 2.5 MG PO TABS
2.5000 mg | ORAL_TABLET | Freq: Every day | ORAL | Status: DC
  Administered 2017-01-06 – 2017-01-07 (×2): 2.5 mg via ORAL
  Filled 2017-01-05 (×2): qty 1

## 2017-01-05 MED ORDER — AMIODARONE HCL 200 MG PO TABS
200.0000 mg | ORAL_TABLET | Freq: Two times a day (BID) | ORAL | Status: DC
  Administered 2017-01-05 – 2017-01-07 (×4): 200 mg via ORAL
  Filled 2017-01-05 (×4): qty 1

## 2017-01-05 MED ORDER — ONDANSETRON HCL 4 MG PO TABS: 4.0000 mg | ORAL_TABLET | Freq: Four times a day (QID) | ORAL | Status: DC | PRN

## 2017-01-05 MED ORDER — SODIUM CHLORIDE 0.9% FLUSH: 3.0000 mL | INTRAVENOUS | Status: DC | PRN

## 2017-01-05 MED ORDER — OXYCODONE HCL 5 MG PO TABS
5.0000 mg | ORAL_TABLET | ORAL | Status: DC | PRN
  Administered 2017-01-05: 5 mg via ORAL
  Filled 2017-01-05: qty 1

## 2017-01-05 MED ORDER — ASPIRIN 81 MG PO TBEC: 81.0000 mg | DELAYED_RELEASE_TABLET | Freq: Every day | ORAL | Status: DC

## 2017-01-05 MED ORDER — PANTOPRAZOLE SODIUM 40 MG PO TBEC
40.0000 mg | DELAYED_RELEASE_TABLET | Freq: Every day | ORAL | Status: DC
  Administered 2017-01-05 – 2017-01-07 (×3): 40 mg via ORAL
  Filled 2017-01-05 (×3): qty 1

## 2017-01-05 MED ORDER — ONDANSETRON HCL 4 MG/2ML IJ SOLN: 4.0000 mg | Freq: Four times a day (QID) | INTRAMUSCULAR | Status: DC | PRN

## 2017-01-05 MED ORDER — ASPIRIN EC 81 MG PO TBEC
81.0000 mg | DELAYED_RELEASE_TABLET | Freq: Every day | ORAL | Status: DC
  Administered 2017-01-05: 81 mg via ORAL
  Filled 2017-01-05: qty 1

## 2017-01-05 MED ORDER — ATORVASTATIN CALCIUM 40 MG PO TABS
40.0000 mg | ORAL_TABLET | Freq: Every day | ORAL | Status: DC
  Administered 2017-01-05 – 2017-01-06 (×2): 40 mg via ORAL
  Filled 2017-01-05 (×2): qty 1

## 2017-01-05 MED ORDER — POLYSACCHARIDE IRON COMPLEX 150 MG PO CAPS
150.0000 mg | ORAL_CAPSULE | Freq: Every day | ORAL | Status: DC
  Administered 2017-01-06 – 2017-01-07 (×2): 150 mg via ORAL
  Filled 2017-01-05 (×2): qty 1

## 2017-01-05 MED ORDER — POLYETHYLENE GLYCOL 3350 17 G PO PACK: 17.0000 g | PACK | Freq: Every day | ORAL | Status: DC | PRN

## 2017-01-05 MED ORDER — SODIUM CHLORIDE 0.9% FLUSH
3.0000 mL | Freq: Two times a day (BID) | INTRAVENOUS | Status: DC
  Administered 2017-01-05 – 2017-01-06 (×3): 3 mL via INTRAVENOUS

## 2017-01-05 MED ORDER — BISACODYL 5 MG PO TBEC: 5.0000 mg | DELAYED_RELEASE_TABLET | Freq: Every day | ORAL | Status: DC | PRN

## 2017-01-05 MED ORDER — SODIUM CHLORIDE 0.9 % IV SOLN: 250.0000 mL | INTRAVENOUS | Status: DC | PRN

## 2017-01-05 MED ORDER — METOPROLOL SUCCINATE ER 25 MG PO TB24: 25.0000 mg | ORAL_TABLET | Freq: Every day | ORAL | Status: DC

## 2017-01-05 MED ORDER — ACETAMINOPHEN 500 MG PO TABS
1000.0000 mg | ORAL_TABLET | Freq: Four times a day (QID) | ORAL | Status: DC | PRN
  Administered 2017-01-06: 1000 mg via ORAL
  Filled 2017-01-05: qty 2

## 2017-01-05 NOTE — H&P (Signed)
History and Physical    Kayla Hopkins [id number] DOB: 07-05-1961 DOA: 01/05/2017  PCP: Hoyt Koch, MD   Patient coming from: Home, by way of [facility] ED   Chief Complaint: Chest pain, SOB   HPI: Kayla Hopkins is a 56 y.o. female with medical history significant for persistent atrial fibrillation on Coumadin, chronic diastolic CHF, GERD, OSA on CPAP, peripheral arterial disease status post bilateral iliac stents, and mitral and tricuspid regurgitation status post repair with maze procedure on 12/10/2016, now presenting in transfer from [facility] ED with chest pain and dyspnea, found to be secondary to acute small nonocclusive pulmonary artery embolism. Patient underwent successful open repair of the mitral and tricuspid valves with maze procedure on 12/10/2016 and was discharged home in stable condition on 12/18/2016. She continued to do well at home, the sternal incision has well-healed, and she was recovering as expected until approximately 5 days ago when she noted the insidious onset of pain under the left breast and exertional dyspnea. She described the pain as severe, localized, sharp, occurring with deep inspiration, and associated with exertional dyspnea. She was evaluated in the cardiology clinic on 12/31/2016, suspected to have a pneumonia, and treated with 5 days of doxycycline which she has just completed. Unfortunately, despite completing the antibiotic, patient's pain and exertional dyspnea continued to progress, prompting her to seek evaluation at the emergency department in [place]. Patient denies any recent fevers or chills and denies abdominal pain, nausea, vomiting, or diarrhea. There has been no melena or hematochezia. Of note, she was noted to have an INR of 11 on 12/31/2016 without bleeding, and Coumadin was held until yesterday. INR never fell below 2.8 and she resumed her Coumadin today. Review of systems is positive for tenderness in the left  calf.  [facility] ED Course: Upon arrival to the [facility] ED, patient is found to be afebrile, saturating well on room air, and with vital signs stable. EKG featured a sinus arrhythmia with no acute ST or T-wave abnormalities. Chest x-ray was notable for small bilateral pleural effusions. Chemistry panel was unremarkable and CBC featured a normocytic anemia with hemoglobin of 10.2, stable from recent hospital discharge, and a thrombocytosis to 506,000, down from 660,000 several days ago. INR was within the therapeutic range at 2.97 and troponin was mildly elevated to 0.50 (normal <0.025). CTA PE study was performed and notable for small, nonocclusive pulmonary artery thromboembolism in the left descending pulmonary artery. CTA also features moderate-sized bilateral pleural effusions, and widespread groundglass opacities consistent with pulmonary edema. Patient was treated with 2 doses of OxyIR 5 mg, remained hemodynamically stable and in no acute respiratory distress while at rest. ED physician discussed the case with hospitalists at [facility], the patient was accepted in transfer, and EDP also notified her CT surgeon of the admission.   Patient is interviewed and examined on the telemetry unit [facility] where she remains hemodynamically stable, dyspneic with complete sentences, nontoxic in appearance, and with moderate pain under the left breast with deep inspiration. Plan of care was discussed with the patient, as well as her husband and RN at the bedside.  Review of Systems:  All other systems reviewed and apart from HPI, are negative.  Past Medical History:  Diagnosis Date  . Anxiety   . Arthritis    "knees, back" (02/11/2016)  . Atrial fibrillation ([place])    a. CHA2DS2VASc = 4;  b. 12/10/2016 s/p Maze @ time of MVR/TVR.  [name] Bruises  easily    d/t being on Eliquis  . Chronic back pain    stenosis  . Chronic diastolic CHF (congestive heart failure) ([place])    a. 02/2016 Echo; EF  55-60%, no rwma, mild to mod MR, mod dil LA, PASP 16mmHg;  b. 10/2016 TEE: nl EF, mod MR, mod-sev TR.  . Claudication ([place])       . Daily headache   . Depression   . GERD (gastroesophageal reflux disease)    takes Nexium nightly  . History of migraine    last one a couple of wks ago  . Hyperlipidemia    takes Atorvastatin daily  . Hypertension    takes Diltiazem and Diltiazem daily  . Migraine    "probably have 3/year; had one today; tried several medications-no relief" (02/11/2016)  . Moderate mitral regurgitation    a. 10/2016 TEE: nl EF, mod MR, mod to severe LAE, mod to sev TR; b. 12/10/2016 MVR (Sorin Carbomedics Annuloflex posterior annuloplasty band (37mm, ref# [id number], ser # [id number]).  . Moderate to Severe Tricuspid regurgitation    a. 10/2014 TEE: mod-sev TR; b. 12/10/2016 s/p TVR w/ Edwards mc3 ring annuloplasty (57mm, model #4900, ser # [id number]).  . Non-obstructive CAD (coronary artery disease)    a. 11/2016 Cath: mRCA 10, otw nl cors, EF 55-65%, 3+MR.  [name] Obstructive sleep apnea    wears CPAP  . Peripheral vascular disease ([place]) 06/11/2009   a. 06/2009 Bilateral common iliac kissing stents (8X24 mm);  b. 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 She underwent successful balloon angioplasty;  c. Restenosis in RCIA in 12/13 s/p  Covered stent; d. 05/2013: 70% in distal left common iliac artery s/p self expanding stent placement; e. 08/2016 ABI: R 1.1, L 0.99, nl Abd Ao.  [name] Persistent atrial fibrillation (HCC)    a. CHA2DS2VASc = 4-->prev eliquis, now coumadin; b. s/p prior DCCV x 2;  c. failed medical therapy-->poor candidate for PVI 2/2 mod MR and LAE;  d. Complete bilateral atrial lesion set using cryothermy and bipolar radiofrequency ablation via median sternotomy with clipping of LA appendage.  [name] PTSD (post-traumatic stress disorder)   . Urinary frequency   . Urinary urgency   . Vertigo     Past Surgical  History:  Procedure Laterality Date  . ABDOMINAL ANGIOGRAM N/A 05/11/2012   Procedure: ABDOMINAL ANGIOGRAM;  Surgeon: [name], MD;  Location: [place] CATH LAB;  Service: Cardiovascular;  Laterality: N/A;  . ABDOMINAL AORTAGRAM N/A 10/19/2012   Procedure: ABDOMINAL Maxcine Ham;  Surgeon: [name], MD;  Location: [place] CATH LAB;  Service: Cardiovascular;  Laterality: N/A;  . ABDOMINAL AORTAGRAM N/A 05/10/2013   Procedure: ABDOMINAL Maxcine Ham;  Surgeon: [name], MD;  Location: [place] CATH LAB;  Service: Cardiovascular;  Laterality: N/A;  . BACK SURGERY    . CARDIAC CATHETERIZATION  2009   no significant obstruction  . CARDIAC CATHETERIZATION N/A 12/02/2016   Procedure: Right/Left Heart Cath and Coronary Angiography;  Surgeon: [name], MD;  Location: [place] CV LAB;  Service: Cardiovascular;  Laterality: N/A;  . CARDIOVERSION N/A 03/19/2016   Procedure: CARDIOVERSION;  Surgeon: Pixie Casino, MD;  Location: [facility] ENDOSCOPY;  Service: Cardiovascular;  Laterality: N/A;  . CARDIOVERSION N/A 07/10/2016   Procedure: CARDIOVERSION;  Surgeon: Pixie Casino, MD;  Location: [facility] ENDOSCOPY;  Service: Cardiovascular;  Laterality: N/A;  . ILIAC ARTERY STENT Bilateral 06/2009   common iliac kissing stents [id number]  mm)  . KNEE ARTHROSCOPY Bilateral   . LUMBAR DISC SURGERY     spinal stenosis  . MAZE N/A 12/10/2016   Procedure: MAZE;  Surgeon: [name], MD;  Location: [place];  Service: Open Heart Surgery;  Laterality: N/A;  . MITRAL VALVE REPAIR N/A 12/10/2016   Procedure: MITRAL VALVE REPAIR (MVR);  Surgeon: [name], MD;  Location: [place];  Service: Open Heart Surgery;  Laterality: N/A;  . MULTIPLE EXTRACTIONS WITH ALVEOLOPLASTY N/A 11/12/2016   Procedure: EXTRACTION OF TOOTH #'S 18, 20-29, AND 31 WITH  ALVEOLOPLASTY AND BILATERAL MANDIBULAR TORI REDUCTIONS.;  Surgeon: Lenn Cal, DDS;  Location: [place];  Service: Oral Surgery;  Laterality: N/A;  . ROBOTIC ASSISTED TOTAL  HYSTERECTOMY WITH SALPINGECTOMY Bilateral 09/23/2015   Procedure: ROBOTIC ASSISTED TOTAL HYSTERECTOMY BILATERALSALPINGECTOMY AND OOPHORECTOMY;  Surgeon: [name], MD;  Location: [facility];  Service: Gynecology;  Laterality: Bilateral;  . TEE WITHOUT CARDIOVERSION N/A 10/13/2016   Procedure: TRANSESOPHAGEAL ECHOCARDIOGRAM (TEE);  Surgeon: Lelon Perla, MD;  Location: [facility] ENDOSCOPY;  Service: Cardiovascular;  Laterality: N/A;  . TEE WITHOUT CARDIOVERSION N/A 12/10/2016   Procedure: TRANSESOPHAGEAL ECHOCARDIOGRAM (TEE);  Surgeon: [name], MD;  Location: [place];  Service: Open Heart Surgery;  Laterality: N/A;  . TRICUSPID VALVE REPLACEMENT N/A 12/10/2016   Procedure: TRICUSPID VALVE REPAIR;  Surgeon: [name], MD;  Location: [place];  Service: Open Heart Surgery;  Laterality: N/A;  . TUBAL LIGATION  1991  . VULVECTOMY N/A 09/23/2015   Procedure: WIDE EXCISION VULVECTOMY  WLE of vulvar VIN 2;  Surgeon: [name], MD;  Location: [facility];  Service: Gynecology;  Laterality: N/A;     reports that she quit smoking about 13 months ago. Her smoking use included Cigarettes. She has a 3.70 pack-year smoking history. She has never used smokeless tobacco. She reports that she drinks alcohol. She reports that she does not use drugs.  Allergies  Allergen Reactions  . Pradaxa [Dabigatran Etexilate Mesylate] Shortness Of Breath and Other (See Comments)    Gastritis, possible SOB  . Zocor [Simvastatin] Other (See Comments)    MYALGIAS, LEG PAINS     Family History  Problem Relation Age of Onset  . Hypertension Mother   . Ovarian cancer Mother   . Lung cancer Mother   . Heart disease Father   . Heart attack Father   . Hypertension Brother   . Colon polyps Sister   . Cancer Maternal Grandfather     lung  . Cancer Paternal Grandfather     throat      Prior to Admission medications   Medication Sig Start Date End Date Taking? Authorizing Provider  acetaminophen (TYLENOL) 500 MG tablet  Take 1,000 mg by mouth every 6 (six) hours as needed for mild pain.   Yes Historical Provider, MD  amiodarone (PACERONE) 200 MG tablet Take 1 tablet (200 mg total) by mouth 2 (two) times daily. 12/03/16  Yes [name], MD  aspirin EC 81 MG EC tablet Take 1 tablet (81 mg total) by mouth daily. 12/18/16  Yes Elgie Collard, PA-C  atorvastatin (LIPITOR) 40 MG tablet Take 1 tablet (40 mg total) by mouth daily. Patient taking differently: Take 40 mg by mouth daily at 6 PM.  08/28/16  Yes Rhonda G Barrett, PA-C  esomeprazole (NEXIUM) 20 MG capsule Take 20 mg by mouth at bedtime.    Yes Historical Provider, MD  furosemide (LASIX) 40 MG tablet Take 1  tablet (40 mg total) by mouth daily. 11/13/16 02/11/17 Yes [name], MD  iron polysaccharides (NIFEREX) 150 MG capsule Take 1 capsule (150 mg total) by mouth daily. 12/18/16  Yes Elgie Collard, PA-C  lisinopril (PRINIVIL,ZESTRIL) 2.5 MG tablet Take 1 tablet (2.5 mg total) by mouth daily. 12/18/16  Yes Elgie Collard, PA-C  metoprolol succinate (TOPROL-XL) 25 MG 24 hr tablet Take 1 tablet (25 mg total) by mouth daily. 12/18/16  Yes Tessa N Conte, PA-C  OVER THE COUNTER MEDICATION Inhale 1 application into the lungs at bedtime. CPAP   Yes Historical Provider, MD  potassium chloride SA (K-DUR,KLOR-CON) 20 MEQ tablet Take 20 mEq by mouth daily.   Yes Historical Provider, MD  traMADol (ULTRAM) 50 MG tablet Take 1 tablet (50 mg total) by mouth every 6 (six) hours as needed. 12/31/16  Yes Rogelia Mire, NP  warfarin (COUMADIN) 4 MG tablet Take 1 tablet (4 mg total) by mouth daily at 6 PM. Patient taking differently: Take 4-6 mg by mouth See admin instructions. Takes 1.5 tabs on mon and fri only Takes 1 tab all other days 12/18/16  Yes Elgie Collard, PA-C    Physical Exam: Vitals:   01/05/17 1859  BP: (!) 132/55  Pulse: 85  Weight: 98.9 kg (218 lb)  Height: 5\' 6"  (1.676 m)      Constitutional: NAD, calm, comfortable Eyes: PERTLA, lids and conjunctivae  normal ENMT: Mucous membranes are moist. Posterior pharynx clear of any exudate or lesions.   Neck: normal, supple, no masses, no thyromegaly Respiratory: Diminished in bilateral bases. Normal respiratory effort. Mild dyspnea with complete sentence. No accessory muscle use.  Cardiovascular: S1 & S2 heard, regular rate and rhythm. No extremity edema. No carotid bruits. Tender left calf.  Abdomen: No distension, no tenderness, no masses palpated. Bowel sounds normal.  Musculoskeletal: no clubbing / cyanosis. No joint deformity upper and lower extremities. Normal muscle tone.  Skin: no significant rashes, lesions, ulcers. Warm, dry, well-perfused. Neurologic: CN 2-12 grossly intact. Sensation intact, DTR normal. Strength 5/5 in all 4 limbs.  Psychiatric: Normal judgment and insight. Alert and oriented x 3. Normal mood and affect.     Labs on Admission: I have personally reviewed following labs and imaging studies  CBC:  Recent Labs Lab 12/31/16 1517  WBC 11.7*  HGB 10.8*  HCT 33.8*  MCV 88.7  PLT [id number]*   Basic Metabolic Panel:  Recent Labs Lab 12/31/16 1517  NA 139  K 4.5  CL 97*  CO2 26  GLUCOSE 102*  BUN 22  CREATININE 0.81  CALCIUM 9.6   GFR: Estimated Creatinine Clearance: 95.2 mL/min (by C-G formula based on SCr of 0.81 mg/dL). Liver Function Tests:  Recent Labs Lab 12/31/16 1517  AST 17  ALT 18  ALKPHOS 113  BILITOT 0.3  PROT 7.4  ALBUMIN 4.0   No results for input(s): LIPASE, AMYLASE in the last 168 hours. No results for input(s): AMMONIA in the last 168 hours. Coagulation Profile:  Recent Labs Lab 12/31/16 1440 12/31/16 1535 01/04/17 1200  INR 8.0 11.1* 2.8   Cardiac Enzymes: No results for input(s): CKTOTAL, CKMB, CKMBINDEX, TROPONINI in the last 168 hours. BNP (last 3 results) No results for input(s): PROBNP in the last 8760 hours. HbA1C: No results for input(s): HGBA1C in the last 72 hours. CBG: No results for input(s): GLUCAP in the last  168 hours. Lipid Profile: No results for input(s): CHOL, HDL, LDLCALC, TRIG, CHOLHDL, LDLDIRECT in the last 72  hours. Thyroid Function Tests: No results for input(s): TSH, T4TOTAL, FREET4, T3FREE, THYROIDAB in the last 72 hours. Anemia Panel: No results for input(s): VITAMINB12, FOLATE, FERRITIN, TIBC, IRON, RETICCTPCT in the last 72 hours. Urine analysis:    Component Value Date/Time   COLORURINE STRAW (A) 12/08/2016 1306   APPEARANCEUR CLEAR 12/08/2016 1306   LABSPEC 1.009 12/08/2016 1306   PHURINE 7.0 12/08/2016 1306   GLUCOSEU NEGATIVE 12/08/2016 1306   HGBUR SMALL (A) 12/08/2016 1306   BILIRUBINUR NEGATIVE 12/08/2016 1306   BILIRUBINUR small 09/01/2013 1149   KETONESUR NEGATIVE 12/08/2016 1306   PROTEINUR NEGATIVE 12/08/2016 1306   UROBILINOGEN 0.2 09/09/2013 1659   NITRITE NEGATIVE 12/08/2016 1306   LEUKOCYTESUR NEGATIVE 12/08/2016 1306   Sepsis Labs: @LABRCNTIP (procalcitonin:4,lacticidven:4) )No results found for this or any previous visit (from the past 240 hour(s)).   Radiological Exams on Admission: No results found.  EKG: Independently reviewed. Sinus arrhythmia, no acute ST or T-wave abnormality (from outside hospital).  Assessment/Plan  1. Acute PE, ?coumadin failure - Pt presented to [facility] ED with 5 days of exertional dyspnea and pain on inspiration under the left breast - She worsened despite completing a 5-day course of doxycycline on 01/04/17 - EKG without acute ischemic features, troponin mildly elevated, no hypoxia, INR 2.97  - CTA chest revealed a small, non-occlusive embolus in the left descending pulmonary artery - Of note, she had been on Pradaxa previously until iliac in-stent thrombosis developed and she was switched to Eliquis, which was stopped prior to her recent surgery; coumadin started on post-op day 2, INR 1.5 on day of discharge, 1.5 on 2/12, then 11 on 12/31/16 when her sxs began; coumadin was held, and restarted on 01/04/17 when INR was  2.8 - Not clear whether this represents a coumadin failure; if so, will likely need DOAC - Heparin infusion for now, case management consultation for possible DOAC - There is tenderness in left calf and venous ultrasound ordered; check TTE for heart-strain; trend troponin   2. Mitral and triscuspid regurgitation s/p repair  - Pt had become increasingly symptomatic with CHF over the past year, was noted to have significant regurgitation at mitral and triscuspid valves and underwent successful open repair on 12/10/2016  - CTS notified of admission  - Sternal incision appears well-healed   3. Atrial fibrillation, persistent  - In a sinus arrhythmia on admission  - CHADS-VASc is 3 (gender, CHF, HTN)  - Anticoagulation as above, continue amiodarone  - She is being monitored on telemetry    4. Chronic diastolic CHF - No peripheral edema or gallop on exam, JVP difficult to visualize, CTA chest features moderate b/l pleural effusions and is suggestive of pulmonary edema; admission wt pending   - TTE (02/11/16) with EF 55-60%, mod LAE, mild-mod MR, mild TR, and moderate elevation in PA pressures; she has undergone mitral and triscuspid repair in the interim  - She is managed at home with Lasix 40 mg qD, metoprolol, and lisinopril  - TTE is ordered as above  - Continue current management with oral Lasix and lisinopril; given acute PE, will hold metoprolol pending TTE  - Follow daily wts and strict I/O's, fluid-restrict diet and SLIV    5. GERD - Stable - Continue daily PPI    6. Normocytic anemia  - Hgb is 10.2 on admission, stable from recent priors; had been 13-range prior to recent surgery, and 8-range after  - No evidence for ongoing blood-loss - Continue iron supplementation   7. OSA -  Tolerating CPAP well at home, will continue     DVT prophylaxis: IV heparin infusion Code Status: Full  Family Communication: Husband updated at bedside Disposition Plan: Admit to telemetry Consults  called: CTS notified of admission  Admission status: Inpatient    Vianne Bulls, MD [facility] Pager [phone]  If 7PM-7AM, please contact night-coverage [email or web address] Password [facility]  01/05/2017, 7:55 PM

## 2017-01-05 NOTE — Telephone Encounter (Signed)
Patient called c/o left sided chest pain under breast x 5 days. She saw cardiology- PA.. had a CXR that showed early pneumonia and small bi lat pleural effusions. She was put on doxycycline for 5 days.   She stated that her breathing and chest pain seemed to be getting worse. She defines and fevers, swelling or problems with her incisions. I recommended that she follow up with Cardiology today about continued sx's. If they are unable to see her, I strongly recommend going to the ED to R/O a PE.

## 2017-01-05 NOTE — Progress Notes (Signed)
Pt is on NIV at this time tolerating it well.  

## 2017-01-05 NOTE — Progress Notes (Signed)
ANTICOAGULATION CONSULT NOTE - Initial Consult  Pharmacy Consult for Heparin Indication: pulmonary embolus  Allergies  Allergen Reactions  . Pradaxa [Dabigatran Etexilate Mesylate] Shortness Of Breath and Other (See Comments)    Gastritis, possible SOB  . Zocor [Simvastatin] Other (See Comments)    MYALGIAS, LEG PAINS     Patient Measurements: Height: 5\' 6"  (167.6 cm) Weight: 218 lb (98.9 kg) IBW/kg (Calculated) : 59.3 Heparin Dosing Weight: 74 kg  Vital Signs: Temp: 99.5 F (37.5 C) (02/27 2009) Temp Source: Oral (02/27 2009) BP: 134/67 (02/27 2009) Pulse Rate: 89 (02/27 2009)  Labs:  Recent Labs  01/04/17 1200  INR 2.8    Estimated Creatinine Clearance: 95.2 mL/min (by C-G formula based on SCr of 0.81 mg/dL).   Medical History: Past Medical History:  Diagnosis Date  . Anxiety   . Arthritis    "knees, back" (02/11/2016)  . Atrial fibrillation ([place])    a. CHA2DS2VASc = 4;  b. 12/10/2016 s/p Maze @ time of MVR/TVR.  [name] Bruises easily    d/t being on Eliquis  . Chronic back pain    stenosis  . Chronic diastolic CHF (congestive heart failure) ([place])    a. 02/2016 Echo; EF 55-60%, no rwma, mild to mod MR, mod dil LA, PASP 40mmHg;  b. 10/2016 TEE: nl EF, mod MR, mod-sev TR.  . Claudication ([place])       . Daily headache   . Depression   . GERD (gastroesophageal reflux disease)    takes Nexium nightly  . History of migraine    last one a couple of wks ago  . Hyperlipidemia    takes Atorvastatin daily  . Hypertension    takes Diltiazem and Diltiazem daily  . Migraine    "probably have 3/year; had one today; tried several medications-no relief" (02/11/2016)  . Moderate mitral regurgitation    a. 10/2016 TEE: nl EF, mod MR, mod to severe LAE, mod to sev TR; b. 12/10/2016 MVR (Sorin Carbomedics Annuloflex posterior annuloplasty band (32mm, ref# [id number], ser # [id number]).  . Moderate to Severe Tricuspid regurgitation    a. 10/2014 TEE: mod-sev TR; b. 12/10/2016 s/p TVR w/  Edwards mc3 ring annuloplasty (56mm, model #4900, ser # [id number]).  . Non-obstructive CAD (coronary artery disease)    a. 11/2016 Cath: mRCA 10, otw nl cors, EF 55-65%, 3+MR.  [name] Obstructive sleep apnea    wears CPAP  . Peripheral vascular disease ([place]) 06/11/2009   a. 06/2009 Bilateral common iliac kissing stents (8X24 mm);  b. 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 She underwent successful balloon angioplasty;  c. Restenosis in RCIA in 12/13 s/p  Covered stent; d. 05/2013: 70% in distal left common iliac artery s/p self expanding stent placement; e. 08/2016 ABI: R 1.1, L 0.99, nl Abd Ao.  [name] Persistent atrial fibrillation (HCC)    a. CHA2DS2VASc = 4-->prev eliquis, now coumadin; b. s/p prior DCCV x 2;  c. failed medical therapy-->poor candidate for PVI 2/2 mod MR and LAE;  d. Complete bilateral atrial lesion set using cryothermy and bipolar radiofrequency ablation via median sternotomy with clipping of LA appendage.  [name] PTSD (post-traumatic stress disorder)   . Urinary frequency   . Urinary urgency   . Vertigo     Assessment: 15 YOF s/p recent MV/TV repair and maze on 2/1. The patient was on Eliquis for hx Afib prior to the procedure and switched over to warfarin post-procedure. The patient's  INR on discharge was 1.52 - the patient was discharged out on 4 mg daily.  Subsequent INR checks showed: 2/12 = 1.5 >> dose increased to 4 mg daily EXCEPT for 6 mg on MWF 2/22 = 8 on POC (venous draw 11.1) >> Given Vit K 5 mg po x 1 and instructed to hold warfarin until repeat INR check 2/26 = 2.8 >> dose resumed at 4 mg daily EXCEPT for 6 mg on Mon/Fri  The patient represents on 2/27 after left-sided CP and SOB concerning for PE. Pharmacy consulted to start Heparin for anticoagulation despite elevated INR. Will hold boluses. Hep Wt: 74 kg  Goal of Therapy:  Heparin level 0.3-0.7 units/ml Monitor platelets by anticoagulation protocol:  Yes   Plan:  1. Start Heparin at 1150 units/hr (11.5 ml/hr) 2. Daily heparin level, CBC, will check PT/INR this evening 3. Will continue to monitor for any signs/symptoms of bleeding and will follow up with heparin level in 6 hours   Thank you for allowing pharmacy to be a part of this patient's care.  Alycia Rossetti, PharmD, BCPS Clinical Pharmacist Pager: [phone] 01/05/2017 8:28 PM

## 2017-01-06 ENCOUNTER — Inpatient Hospital Stay (HOSPITAL_COMMUNITY): Payer: Medicare HMO

## 2017-01-06 ENCOUNTER — Encounter: Payer: Self-pay | Admitting: *Deleted

## 2017-01-06 DIAGNOSIS — I2699 Other pulmonary embolism without acute cor pulmonale: Secondary | ICD-10-CM

## 2017-01-06 DIAGNOSIS — M79609 Pain in unspecified limb: Secondary | ICD-10-CM

## 2017-01-06 DIAGNOSIS — I340 Nonrheumatic mitral (valve) insufficiency: Secondary | ICD-10-CM

## 2017-01-06 LAB — HEPARIN LEVEL (UNFRACTIONATED)
HEPARIN UNFRACTIONATED: 0.34 [IU]/mL (ref 0.30–0.70)
Heparin Unfractionated: 0.17 IU/mL — ABNORMAL LOW (ref 0.30–0.70)

## 2017-01-06 LAB — COMPREHENSIVE METABOLIC PANEL
ALBUMIN: 2.9 g/dL — AB (ref 3.5–5.0)
ALT: 19 U/L (ref 14–54)
ANION GAP: 9 (ref 5–15)
AST: 19 U/L (ref 15–41)
Alkaline Phosphatase: 100 U/L (ref 38–126)
BILIRUBIN TOTAL: 0.4 mg/dL (ref 0.3–1.2)
BUN: 18 mg/dL (ref 6–20)
CHLORIDE: 97 mmol/L — AB (ref 101–111)
CO2: 29 mmol/L (ref 22–32)
Calcium: 9.1 mg/dL (ref 8.9–10.3)
Creatinine, Ser: 0.87 mg/dL (ref 0.44–1.00)
GFR calc Af Amer: >60 mL/min (ref >60)
GFR calc non Af Amer: >60 mL/min (ref >60)
GLUCOSE: 120 mg/dL — AB (ref 65–99)
POTASSIUM: 3.5 mmol/L (ref 3.5–5.1)
SODIUM: 135 mmol/L (ref 135–145)
TOTAL PROTEIN: 6 g/dL — AB (ref 6.5–8.1)

## 2017-01-06 LAB — CBC
HEMATOCRIT: 30.4 % — AB (ref 36.0–46.0)
Hemoglobin: 9.5 g/dL — ABNORMAL LOW (ref 12.0–15.0)
MCH: 27.5 pg (ref 26.0–34.0)
MCHC: 31.3 g/dL (ref 30.0–36.0)
MCV: 88.1 fL (ref 78.0–100.0)
PLATELETS: 404 10*3/uL — AB (ref 150–400)
RBC: 3.45 MIL/uL — ABNORMAL LOW (ref 3.87–5.11)
RDW: 14.4 % (ref 11.5–15.5)
WBC: 8.3 10*3/uL (ref 4.0–10.5)

## 2017-01-06 LAB — HIV ANTIBODY (ROUTINE TESTING W REFLEX): HIV Screen 4th Generation wRfx: NONREACTIVE

## 2017-01-06 LAB — TROPONIN I
TROPONIN I: 0.05 ng/mL — AB (ref <0.03)
TROPONIN I: 0.04 ng/mL — AB (ref <0.03)

## 2017-01-06 LAB — ECHOCARDIOGRAM COMPLETE
Height: 66 in
Weight: 3488 oz

## 2017-01-06 LAB — MAGNESIUM: Magnesium: 1.7 mg/dL (ref 1.7–2.4)

## 2017-01-06 LAB — PROTIME-INR
INR: 2.55
Prothrombin Time: 27.9 seconds — ABNORMAL HIGH (ref 11.4–15.2)

## 2017-01-06 MED ORDER — WARFARIN SODIUM 4 MG PO TABS
4.0000 mg | ORAL_TABLET | Freq: Once | ORAL | Status: AC
  Administered 2017-01-06: 4 mg via ORAL
  Filled 2017-01-06: qty 1

## 2017-01-06 MED ORDER — ACETAMINOPHEN 500 MG PO TABS
1000.0000 mg | ORAL_TABLET | Freq: Three times a day (TID) | ORAL | Status: DC
  Administered 2017-01-06: 1000 mg via ORAL
  Filled 2017-01-06: qty 2

## 2017-01-06 MED ORDER — WARFARIN - PHARMACIST DOSING INPATIENT: Freq: Every day | Status: DC

## 2017-01-06 NOTE — Progress Notes (Signed)
*  PRELIMINARY RESULTS* Vascular Ultrasound Bilateral lower extremity venosu duplex has been completed.  Preliminary findings: No evidence of deep vein thrombosis or baker's cysts bilaterally.   Everrett Coombe 01/06/2017, 10:59 AM

## 2017-01-06 NOTE — Progress Notes (Addendum)
ANTICOAGULATION CONSULT NOTE - Follow Up Consult  Pharmacy Consult for Coumadin Indication: afib s/p recent MV/TV repair and maze   Allergies  Allergen Reactions  . Pradaxa [Dabigatran Etexilate Mesylate] Shortness Of Breath and Other (See Comments)    Gastritis, possible SOB  . Zocor [Simvastatin] Other (See Comments)    MYALGIAS, LEG PAINS     Patient Measurements: Height: 5\' 6"  (167.6 cm) Weight: 218 lb (98.9 kg) IBW/kg (Calculated) : 59.3 Heparin Dosing Weight: 81 kg  Vital Signs: Temp: 97.7 Hopkins (36.5 C) (02/28 0500) Temp Source: Oral (02/28 0500) BP: 113/62 (02/28 0805) Pulse Rate: 77 (02/28 0500)  Labs:  Recent Labs  01/04/17 1200 01/05/17 2053 01/06/17 0125 01/06/17 0757 01/06/17 1230  HGB  --   --  9.5*  --   --   HCT  --   --  30.4*  --   --   PLT  --   --  404*  --   --   LABPROT  --   --  27.9*  --   --   INR 2.8  --  2.55  --   --   HEPARINUNFRC  --   --  0.17*  --  0.34  CREATININE  --   --  0.87  --   --   TROPONINI  --  0.04* 0.05* 0.04*  --     Estimated Creatinine Clearance: 88.7 mL/min (by C-G formula based on SCr of 0.87 mg/dL).   Medications:  Scheduled:  . amiodarone  200 mg Oral BID  . atorvastatin  40 mg Oral q1800  . furosemide  40 mg Oral Daily  . iron polysaccharides  150 mg Oral Daily  . lisinopril  2.5 mg Oral Daily  . pantoprazole  40 mg Oral Daily  . sodium chloride flush  3 mL Intravenous Q12H  . sodium chloride flush  3 mL Intravenous Q12H   Infusions:  . heparin 1,300 Units/hr (01/06/17 0335)    Assessment: 56 yo Hopkins s/p recent MV/TV repair and maze on 2/1.  The patient was on Eliquis for hx Afib prior to the procedure and switched over to warfarin post-procedure. The patient's INR on discharge was 1.52 - the patient was discharged out on 4 mg daily.  Subsequent INR checks showed: 2/12 = 54 >> dose increased to 4 mg daily EXCEPT for 6 mg on MWF 2/22 = 8 on POC (venous draw 11.1) >> Given Vit K 5 mg po x 1 and  instructed to hold warfarin until repeat INR check 2/26 = 2.8 >> dose resumed at 4 mg daily EXCEPT for 6 mg on Mon/Fri  The patient represents on 2/27 after left-sided CP and SOB concerning for PE. CT at outside hospital showed small non-occlusive PE, reread by [facility] radiologists as not an acute pulmonary embolus.  Pt currently at low end of therapeutic goal on heparin at 1300 units/hr.  Now plans to stop heparin and restart Coumadin.  Home Coumadin dose above.  Goal of Therapy:  INR 2-3 Monitor platelets by anticoagulation protocol: Yes   Plan:  Coumadin 4mg  PO x 1 tonight. Daily INR.  [employer], Pharm.D., BCPS Clinical Pharmacist Pager [phone] 01/06/2017 1:46 PM

## 2017-01-06 NOTE — Progress Notes (Signed)
  Echocardiogram 2D Echocardiogram has been performed.  Diamond Nickel 01/06/2017, 12:23 PM

## 2017-01-06 NOTE — Progress Notes (Signed)
Pt is on NIV at this time tolerating it well.  

## 2017-01-06 NOTE — Progress Notes (Signed)
[address]       [place] [address]             [phone]     CARDIOTHORACIC SURGERY PROGRESS NOTE  Subjective: Patient describes 5 day history of increased pain left side of chest that is worse with inspiration and movement.  She denies fevers, chills, or productive cough.  Palpation of sternum and chest wall elicits discomfort but there is no sternal click or instability  Objective: Vital signs in last 24 hours: Temp:  [97.7 F (36.5 C)-99.5 F (37.5 C)] 97.7 F (36.5 C) (02/28 0500) Pulse Rate:  [77-89] 77 (02/28 0500) Cardiac Rhythm: Normal sinus rhythm (02/28 0700) Resp:  [16-18] 18 (02/28 0500) BP: (113-148)/(55-71) 113/62 (02/28 0805) SpO2:  [93 %-98 %] 95 % (02/28 0500) Weight:  [218 lb (98.9 kg)] 218 lb (98.9 kg) (02/27 1859)  Physical Exam:  Rhythm:   sinus  Breath sounds: Diminished at bases, otherwise clear  Heart sounds:  RRR w/out murmur  Incisions:  Clean and dry, healing nicely  Abdomen:  Soft, non-distended, non-tender  Extremities:  Warm, well-perfused    Intake/Output from previous day: No intake/output data recorded. Intake/Output this shift: No intake/output data recorded.  Lab Results:  Recent Labs  01/06/17 0125  WBC 8.3  HGB 9.5*  HCT 30.4*  PLT 404*   BMET:  Recent Labs  01/06/17 0125  NA 135  K 3.5  CL 97*  CO2 29  GLUCOSE 120*  BUN 18  CREATININE 0.87  CALCIUM 9.1    CBG (last 3)  No results for input(s): GLUCAP in the last 72 hours. PT/INR:   Recent Labs  01/06/17 0125  LABPROT 27.9*  INR 2.55    CXR:  CHEST  2 VIEW  COMPARISON:  12/31/2016  FINDINGS: Upper normal size of cardiac silhouette post LEFT atrial appendage clipping.  Mediastinal contours and pulmonary vascularity normal.  Mild RIGHT basilar atelectasis.  Small bibasilar pleural effusions.  Remaining lungs clear.  No pneumothorax or acute osseous findings.  IMPRESSION: Persistent small bibasilar pleural  effusions and RIGHT basilar atelectasis.   Electronically Signed   By: Lavonia Dana M.D.   On: 01/06/2017 08:01  Assessment/Plan:  I have personally reviewed the CT scan performed yesterday in the emergency department at [facility] and directly reviewed images with 2 of our radiologists including Dr. Markus Daft and Dr. Simon Rhein.  The patient has moderate bilateral pleural effusions. There is a subtle abnormality in one of the segmental branches of the pulmonary arteries on the left side that is only seen on one cut and is not continued on other cuts. It is the opinion of our local radiologists that this is not an acute pulmonary embolus. If it is a pulmonary embolus is likely old. It may be artifact due to motion. The patient does have mild groundglass opacity consistent with pulmonary edema as well as atelectasis on both sides related to her pulmonary effusions.  Given these findings and the fact that the patient was therapeutic or supratherapeutic on warfarin at the time of her onset of symptoms 5 days ago it seems unlikely that the patient's symptoms are related to a pulmonary embolus.  From a surgical standpoint there are no contraindications at this point to changing therapy to a DOAC instead of warfarin.  However, use of a DOAC in the early postoperative period following mitral valve repair and maze procedure with preceding history of atrial fibrillation is an off label  indication. I will defer to the cardiology team and the hospitalist service subsequent decisions regarding anticoagulation.  I suspect that the patient's pain is related to her recent sternotomy. There is no evidence that she has or has had pneumonia or upper respiratory tract infection. She does have moderate size effusions and she might benefit from bilateral ultrasound-guided thoracentesis, although this would not likely affect her complaints of pain.     [name], MD 01/06/2017 9:13 AM

## 2017-01-06 NOTE — Consult Note (Signed)
   [facility] Inpatient Consult   01/06/2017  Kayla Hopkins 06/01/1961 [id number]  Patient screened for potential [facility] Management services. Patient is eligible for [facility] Care Management services under patient's [facility]  plan.   Came by to see the patient she is currently off the unit.     Natividad Brood, RN BSN [facility] Liaison  [phone] business mobile phone Toll free office [phone]

## 2017-01-06 NOTE — Progress Notes (Addendum)
PROGRESS NOTE        PATIENT DETAILS Name: Kayla Hopkins Age: 56 y.o. Sex: female Date of Birth: 1960-12-06 Admit Date: 01/05/2017 Admitting Physician Vianne Bulls, MD [id number] Becky Augusta, MD  Brief Narrative: Patient is a 56 y.o. female with history of chronic atrial fibrillation, with recent mitral valve/tricuspid valve repair along with a maze procedure subsequently sent home on 2/9 from this hospital on Coumadin, presented to the outside hospital with approximately 5/6 days a history of worsening chest pain and shortness of breath. A CT scan of the chest done at outside hospital showed a hospital small pulmonary embolism. She was subsequently transferred to [facility], hospital for further evaluation and treatment.  Subjective: Feels stable-denies any shortness of breath to me this morning.  Assessment/Plan: Pulmonary embolism: Appreciate CT surgery input, per CT surgery note-CT images reviewed by 2 different radiologists-not sure if this is an acute pulmonary embolus-if it is a pulmonary embolus it is likely a chronic embolus, but it also may be due to motion artifact. Given this diagnostic uncertainty-and the fact that when patient started having shortness of breath and chest pain, her INR was supratherapeutic-it is unlikely that pulmonary embolism is the cause of her presenting symptoms. Agree with CT surgery, that her chest pain probably is related to her recent sternotomy, she could have some mild shortness of breath and anxiety from underlying pleural effusion. Await lower extremity Doppler, in the interim will plan on resuming Coumadin-and stopping IV heparin.  Chest pain: Recent cardiac cath did not show any significant CAD, on anticoagulation-see above regarding pulmonary embolism. Chest pain thought to be secondary to poststernotomy pain.  Recent hx of MR/TR repair with MAZE procedure:Appreciate CT surgery input-regarding coumadin vs NOAC, spoke with  cardiology on call-Dr. Carlena Bjornstad reviewed the patient's chart, and suggested that we continue with coumadin, he was of the opinion that a NOAC was not indicated for valvular issues.   History of Chronic atrial fibrillation: Appears to be in sinus rhythm-s/p recent maze procedure-see above regarding anticoagulation issue. Rate controlled with amiodarone.CHADS-VASc is 3  Chronic diastolic heart failure: Clinically compensated, continue Lasix.  GERD: Continue PPI  Normocytic anemia: Hemoglobin stable. No indication of blood loss. Follow.  OSA: CPAP daily at bedtime  DVT Prophylaxis: Full dose anticoagulation with Coumadin  Code Status: Full code   Family Communication: None at bedside  Disposition Plan: Remain inpatient-ambulate today-home likely tomorrow  Antimicrobial agents: Anti-infectives    None     Procedures: None  CONSULTS:  CTVS  Time spent: 25 minutes-Greater than 50% of this time was spent in counseling, explanation of diagnosis, planning of further management, and coordination of care.  MEDICATIONS: Scheduled Meds: . amiodarone  200 mg Oral BID  . atorvastatin  40 mg Oral q1800  . furosemide  40 mg Oral Daily  . iron polysaccharides  150 mg Oral Daily  . lisinopril  2.5 mg Oral Daily  . pantoprazole  40 mg Oral Daily  . sodium chloride flush  3 mL Intravenous Q12H  . sodium chloride flush  3 mL Intravenous Q12H   Continuous Infusions: . heparin 1,300 Units/hr (01/06/17 0335)   PRN Meds:.sodium chloride, acetaminophen, bisacodyl, ondansetron **OR** ondansetron (ZOFRAN) IV, oxyCODONE, polyethylene glycol, sodium chloride flush   PHYSICAL EXAM: Vital signs: Vitals:   01/05/17 2316 01/06/17 0055 01/06/17 0500 01/06/17 0805  BP: [name])  148/71  113/66 113/62  Pulse:  83 77   Resp:  16 18   Temp:   97.7 F (36.5 C)   TempSrc:   Oral   SpO2:  98% 95%   Weight:      Height:       Filed Weights   01/05/17 1859  Weight: 98.9 kg (218 lb)   Body  mass index is 35.19 kg/m.   General appearance :Awake, alert, not in any distress. Speech Clear. Not toxic Looking Eyes:, pupils equally reactive to light and accomodation,no scleral icterus HEENT: Atraumatic and Normocephalic Neck: supple, no JVD. No cervical lymphadenopathy.  Resp:Good air entry bilaterally, no added sounds  CVS: S1 S2 regular, no murmurs.  GI: Bowel sounds present, Non tender and not distended with no gaurding, rigidity or rebound. Extremities: B/L Lower Ext shows no edema, both legs are warm to touch Neurology:  speech clear,Non focal, sensation is grossly intact. Psychiatric: Normal judgment and insight. Alert and oriented x 3. Normal mood. Musculoskeletal:No digital cyanosis Skin:No Rash, warm and dry Wounds:N/A  I have personally reviewed following labs and imaging studies  LABORATORY DATA: CBC:  Recent Labs Lab 12/31/16 1517 01/06/17 0125  WBC 11.7* 8.3  HGB 10.8* 9.5*  HCT 33.8* 30.4*  MCV 88.7 88.1  PLT 660* 404*    Basic Metabolic Panel:  Recent Labs Lab 12/31/16 1517 01/06/17 0125  NA 139 135  K 4.5 3.5  CL 97* 97*  CO2 26 29  GLUCOSE 102* 120*  BUN 22 18  CREATININE 0.81 0.87  CALCIUM 9.6 9.1  MG  --  1.7    GFR: Estimated Creatinine Clearance: 88.7 mL/min (by C-G formula based on SCr of 0.87 mg/dL).  Liver Function Tests:  Recent Labs Lab 12/31/16 1517 01/06/17 0125  AST 17 19  ALT 18 19  ALKPHOS 113 100  BILITOT 0.3 0.4  PROT 7.4 6.0*  ALBUMIN 4.0 2.9*   No results for input(s): LIPASE, AMYLASE in the last 168 hours. No results for input(s): AMMONIA in the last 168 hours.  Coagulation Profile:  Recent Labs Lab 12/31/16 1440 12/31/16 1535 01/04/17 1200 01/06/17 0125  INR 8.0 11.1* 2.8 2.55    Cardiac Enzymes:  Recent Labs Lab 01/05/17 2053 01/06/17 0125 01/06/17 0757  TROPONINI 0.04* 0.05* 0.04*    BNP (last 3 results) No results for input(s): PROBNP in the last 8760 hours.  HbA1C: No results  for input(s): HGBA1C in the last 72 hours.  CBG: No results for input(s): GLUCAP in the last 168 hours.  Lipid Profile: No results for input(s): CHOL, HDL, LDLCALC, TRIG, CHOLHDL, LDLDIRECT in the last 72 hours.  Thyroid Function Tests: No results for input(s): TSH, T4TOTAL, FREET4, T3FREE, THYROIDAB in the last 72 hours.  Anemia Panel: No results for input(s): VITAMINB12, FOLATE, FERRITIN, TIBC, IRON, RETICCTPCT in the last 72 hours.  Urine analysis:    Component Value Date/Time   COLORURINE STRAW (A) 12/08/2016 1306   APPEARANCEUR CLEAR 12/08/2016 1306   LABSPEC 1.009 12/08/2016 1306   PHURINE 7.0 12/08/2016 1306   GLUCOSEU NEGATIVE 12/08/2016 1306   HGBUR SMALL (A) 12/08/2016 1306   BILIRUBINUR NEGATIVE 12/08/2016 1306   BILIRUBINUR small 09/01/2013 1149   KETONESUR NEGATIVE 12/08/2016 1306   PROTEINUR NEGATIVE 12/08/2016 1306   UROBILINOGEN 0.2 09/09/2013 1659   NITRITE NEGATIVE 12/08/2016 1306   LEUKOCYTESUR NEGATIVE 12/08/2016 1306    Sepsis Labs: Lactic Acid, Venous No results found for: LATICACIDVEN  MICROBIOLOGY: No results found for this or  any previous visit (from the past 240 hour(s)).  RADIOLOGY STUDIES/RESULTS: Dg Chest 2 View  Result Date: 01/06/2017 CLINICAL DATA:  Lab this is a follow-up are CHF hypertension, disease former smoker EXAM: CHEST  2 VIEW COMPARISON:  12/31/2016 FINDINGS: Upper normal size of cardiac silhouette post LEFT atrial appendage clipping. Mediastinal contours and pulmonary vascularity normal. Mild RIGHT basilar atelectasis. Small bibasilar pleural effusions. Remaining lungs clear. No pneumothorax or acute osseous findings. IMPRESSION: Persistent small bibasilar pleural effusions and RIGHT basilar atelectasis. Electronically Signed   By: Lavonia Dana M.D.   On: 01/06/2017 08:01   Dg Chest 2 View  Result Date: 12/31/2016 CLINICAL DATA:  Two days of shortness of breath and sternal chest pain. Recent Maze procedure with mitral valve  repair and tricuspid valve replacement. EXAM: CHEST  2 VIEW COMPARISON:  Chest x-ray of December 15, 2016 FINDINGS: The lungs are reasonably well inflated. There are small bilateral pleural effusions which are new. The interstitial markings of both lungs remain mildly increased but have improved since December 15, 2016. There is increased density in the retrocardiac region on the left consistent with atelectasis or infiltrate. The heart is not enlarged. The left atrial appendage clip is in stable position. The prosthetic valve-repair rings are visible in the tricuspid and mitral positions. The mediastinum is normal in width. The sternal wires are intact. The bony thorax exhibits no acute abnormality. IMPRESSION: Persistent mild pulmonary interstitial edema and new small bilateral pleural effusions likely reflect CHF. Left lower lobe atelectasis or early pneumonia has also developed. Followup PA and lateral chest X-ray is recommended in 3-4 weeks following trial of antibiotic therapy to ensure resolution. Electronically Signed   By: David  Martinique M.D.   On: 12/31/2016 17:01   Dg Chest 2 View  Result Date: 12/15/2016 CLINICAL DATA:  Shortness of Breath EXAM: CHEST  2 VIEW COMPARISON:  December 14, 2016 FINDINGS: There is fairly mild underlying interstitial edema, similar to 1 day prior. The apparent atelectasis has cleared from the right base. On the lateral view, there are small pleural effusions. No new opacity is evident. Heart is upper normal in size with mild pulmonary venous hypertension. No evident adenopathy. Patient is status post mitral valve replacement and left atrial appendage clamp placement. No pneumothorax. No adenopathy. No bone lesions. Temporary pacemaker wires are attached to the right heart. IMPRESSION: There is a degree of underlying interstitial edema with equivocal pleural effusions bilaterally. No airspace consolidation. Stable cardiac silhouette. There is a degree of pulmonary venous  hypertension. No pneumothorax. No new opacity. Electronically Signed   By: Lowella Grip III M.D.   On: 12/15/2016 08:28   Dg Chest 2 View  Result Date: 12/08/2016 CLINICAL DATA:  Heart valve repair. EXAM: CHEST  2 VIEW COMPARISON:  02/10/2016. FINDINGS: Mediastinum and hilar structures are normal. Mild cardiomegaly. No pulmonary venous congestion . Mild right base subsegmental atelectasis. No pleural effusion or pneumothorax . IMPRESSION: 1.  Mild right base subsegmental atelectasis. 2.  Mild cardiomegaly.  No pulmonary venous congestion . Electronically Signed   By: Marcello Moores  Register   On: 12/08/2016 14:34   Dg Chest Port 1 View  Result Date: 12/14/2016 CLINICAL DATA:  Shortness of breath. Status post mitral valve replacement. EXAM: PORTABLE CHEST 1 VIEW COMPARISON:  12/13/2016 and 12/08/2016 FINDINGS: There is progressive haziness throughout the right lung. Heart size and vascularity are normal in the left lung is clear. Chest tubes have been removed.  No pneumothorax. IMPRESSION: Slight progression of haziness in the  right lung which probably represents a combination of slight residual pulmonary edema and atelectasis. Electronically Signed   By: Lorriane Shire M.D.   On: 12/14/2016 07:26   Dg Chest Port 1 View  Result Date: 12/13/2016 CLINICAL DATA:  Mitral valve repair EXAM: PORTABLE CHEST 1 VIEW COMPARISON:  December 12, 2016 FINDINGS: Stable support apparatus. Persistent pulmonary edema, similar to slightly worsened in the interval. No other change. IMPRESSION: Stable support apparatus with stable to mildly worsened pulmonary edema. Electronically Signed   By: Dorise Bullion III M.D   On: 12/13/2016 07:52   Dg Chest Port 1 View  Result Date: 12/12/2016 CLINICAL DATA:  Atelectasis. EXAM: PORTABLE CHEST 1 VIEW COMPARISON:  Radiograph of December 11, 2016. FINDINGS: Stable cardiomediastinal silhouette. Sternotomy wires are noted. Left-sided chest tube is again noted and unchanged with no  pneumothorax. Right-sided chest tube is also unchanged without evidence of pneumothorax. Mild right basilar subsegmental atelectasis or infiltrate is noted. Right internal jugular venous sheath remains. Swan-Ganz catheter has been removed. Mediastinal drain remains in position. IMPRESSION: Bilateral chest tubes are noted without pneumothorax. Mild right basilar subsegmental atelectasis or infiltrate is noted. Electronically Signed   By: Marijo Conception, M.D.   On: 12/12/2016 09:05   Dg Chest Port 1 View  Result Date: 12/11/2016 CLINICAL DATA:  Status post mitral valve replacement, chest tube. EXAM: PORTABLE CHEST 1 VIEW COMPARISON:  Chest radiograph from one day prior. FINDINGS: Right internal jugular Swan-Ganz catheter terminates over the right pulmonary artery. Stable position of bilateral chest tubes, mediastinal drains, median sternotomy wires and cardiac valve annuloplasty ring. Stable cardiomediastinal silhouette with top-normal heart size. No pneumothorax. No pleural effusion. Stable low lung volumes. No overt pulmonary edema. Stable mild bibasilar atelectasis. IMPRESSION: Support structures in place.  No pneumothorax. Stable low lung volumes with mild bibasilar atelectasis. Electronically Signed   By: Ilona Sorrel M.D.   On: 12/11/2016 07:41   Dg Chest Port 1 View  Result Date: 12/10/2016 CLINICAL DATA:  Status post cardiac surgery EXAM: PORTABLE CHEST 1 VIEW COMPARISON:  12/08/2016 FINDINGS: Postsurgical changes are noted. Swan-Ganz catheter is noted in the right pulmonary artery. Bilateral thoracostomy catheters as well as a pericardial and mediastinal drain are seen. Nasogastric catheter is noted within the stomach. Endotracheal tube is seen in satisfactory position. The overall inspiratory effort is poor with minimal basilar atelectasis. No pneumothorax is seen. IMPRESSION: Tubes and lines as described. Minimal bibasilar atelectasis. Electronically Signed   By: Inez Catalina M.D.   On: 12/10/2016  15:32     LOS: 1 day   Oren Binet, MD  [facility] Pager:[phone]  If 7PM-7AM, please contact night-coverage [email or web address] Password [email or web address] 01/06/2017, 1:33 PM

## 2017-01-06 NOTE — Care Management Note (Signed)
Case Management Note  Patient Details  Name: Kayla Hopkins MRN: [id number] Date of Birth: Feb 12, 1961  Subjective/Objective:   Pt presented for  Acute Pulmonary Embolism. Pt is from home with family support. Plan will be to return home once stable. Pt was previously on Eliquis. Pt has 2 bottles of Eliquis at home. Per pt she was getting the medication via the company's patient assistance fund- [employer]. Pt is aware of deductible for medications. No further needs from CM at this time.               Action/Plan: Jamelle Haring  [facility] [place] # [phone]   1. ELIQUIS 2.5 MG BID  COVER- YES  CO-PAY- $ 212.51   DEDUCTIBLE NOT MET  TIER- 3 DRUG  PRIOR APPROVAL- NO   2. ELIQUIS 5 MG BID  SAME AS ABOVE   3. XARELTO 15 MG BID  COVER- YES  CO-PAY $ 212.51  TIER- 3 DRUG  PRIOR APPROVAL-NO   4 XARELTO 20 MG DAILY  SAME AS ABOVE   PREFERRED PHARMACY : [facility] AND [facility]   Expected Discharge Date:                  Expected Discharge Plan:  Home/Self Care  In-House Referral:  NA  Discharge planning Services  CM Consult, Medication Assistance  Post Acute Care Choice:  NA Choice offered to:  NA  DME Arranged:  N/A DME Agency:  NA  HH Arranged:  NA HH Agency:  NA  Status of Service:  Completed, signed off  If discussed at [place] of Stay Meetings, dates discussed:    Additional Comments:  Bethena Roys, RN 01/06/2017, 2:35 PM

## 2017-01-06 NOTE — Progress Notes (Signed)
ANTICOAGULATION CONSULT NOTE - Follow Up Consult  Pharmacy Consult for Heparin  Indication: pulmonary embolus  Allergies  Allergen Reactions  . Pradaxa [Dabigatran Etexilate Mesylate] Shortness Of Breath and Other (See Comments)    Gastritis, possible SOB  . Zocor [Simvastatin] Other (See Comments)    MYALGIAS, LEG PAINS    Patient Measurements: Height: 5\' 6"  (167.6 cm) Weight: 218 lb (98.9 kg) IBW/kg (Calculated) : 59.3  Vital Signs: Temp: 99.5 F (37.5 C) (02/27 2009) Temp Source: Oral (02/27 2009) BP: 148/71 (02/27 2316) Pulse Rate: 83 (02/28 0055)  Labs:  Recent Labs  01/04/17 1200 01/05/17 2053 01/06/17 0125  HGB  --   --  9.5*  HCT  --   --  30.4*  PLT  --   --  404*  LABPROT  --   --  27.9*  INR 2.8  --  2.55  HEPARINUNFRC  --   --  0.17*  CREATININE  --   --  0.87  TROPONINI  --  0.04* 0.05*    Estimated Creatinine Clearance: 88.7 mL/min (by C-G formula based on SCr of 0.87 mg/dL).   Assessment: 42 YOF s/p recent MV/TV repair and maze on 2/1. The patient was on Eliquis for hx Afib prior to the procedure and switched over to warfarin post-procedure. The patient's INR on discharge was 1.52 - the patient was discharged out on 4 mg daily.  Subsequent INR checks showed: 2/12 = 1.5 >> dose increased to 4 mg daily EXCEPT for 6 mg on MWF 2/22 = 8 on POC (venous draw 11.1) >> Given Vit K 5 mg po x 1 and instructed to hold warfarin until repeat INR check 2/26 = 2.8 >> dose resumed at 4 mg daily EXCEPT for 6 mg on Mon/Fri  The patient represents on 2/27 after left-sided CP and SOB concerning for PE. CT at outside hospital showed small non-occlusive PE, Pharmacy consulted to start Heparin for anticoagulation despite elevated INR. Will hold boluses. Hep Wt: 74 kg  2/28: Initial heparin level is low at 0.17, INR remains elevated at 2.55  Goal of Therapy:  Heparin level 0.3-0.7 units/ml Monitor platelets by anticoagulation protocol: Yes   Plan:  -Inc heparin  to 1300 units/hr -1200 HL  Narda Bonds 01/06/2017,3:27 AM

## 2017-01-07 ENCOUNTER — Other Ambulatory Visit: Payer: Self-pay | Admitting: Thoracic Surgery (Cardiothoracic Vascular Surgery)

## 2017-01-07 DIAGNOSIS — Z8679 Personal history of other diseases of the circulatory system: Secondary | ICD-10-CM

## 2017-01-07 DIAGNOSIS — Z9889 Other specified postprocedural states: Principal | ICD-10-CM

## 2017-01-07 LAB — CBC
HEMATOCRIT: 32.3 % — AB (ref 36.0–46.0)
HEMOGLOBIN: 10.1 g/dL — AB (ref 12.0–15.0)
MCH: 27.4 pg (ref 26.0–34.0)
MCHC: 31.3 g/dL (ref 30.0–36.0)
MCV: 87.5 fL (ref 78.0–100.0)
Platelets: 414 10*3/uL — ABNORMAL HIGH (ref 150–400)
RBC: 3.69 MIL/uL — ABNORMAL LOW (ref 3.87–5.11)
RDW: 14.2 % (ref 11.5–15.5)
WBC: 7 10*3/uL (ref 4.0–10.5)

## 2017-01-07 LAB — PROTIME-INR
INR: 2.4
PROTHROMBIN TIME: 26.6 s — AB (ref 11.4–15.2)

## 2017-01-07 NOTE — Progress Notes (Signed)
Pt refused labs this morning. Lab said they will come back at 0800 and ask again.

## 2017-01-07 NOTE — Plan of Care (Signed)
Problem: Safety: Goal: Ability to remain free from injury will improve Outcome: Progressing Pt has had no falls or new signs of skin breakdown this shift.  Pt sleeping at this time. Hourly rounding performed. Will continue to monitor pt and keep free of injury.

## 2017-01-07 NOTE — Progress Notes (Signed)
Discharge instructions reviewed with pt and husband at bedside. Pt left via wheelchair in NAD with all belongings present with husband.

## 2017-01-07 NOTE — Discharge Summary (Signed)
PATIENT DETAILS Name: Kayla Hopkins Age: 56 y.o. Sex: female Date of Birth: [birth year] MRN: [id number]. Admitting Physician: Vianne Bulls, MD [id number] Becky Augusta, MD  Admit Date: 01/05/2017 Discharge date: 01/07/2017  Recommendations for Outpatient Follow-up:  1. Follow up with PCP in 1-2 weeks 2. Please obtain BMP/CBC in one week 3. Please ensure follow-up with cardiology, cardiothoracic surgery and Coumadin clinic.  Admitted From:  Home  Disposition: [place]: No  Equipment/Devices: None  Discharge Condition: Stable  CODE STATUS: FULL CODE  Diet recommendation:  Heart Healthy  Brief Summary: See H&P, Labs, Consult and Test reports for all details in brief, Patient is a 56 y.o. female with history of chronic atrial fibrillation, with recent mitral valve/tricuspid valve repair along with a maze procedure subsequently sent home on 2/9 from this hospital on Coumadin, presented to the outside hospital with approximately 5/6 days a history of worsening chest pain and shortness of breath. A CT scan of the chest done at outside hospital showed a hospital small pulmonary embolism. She was subsequently transferred to [facility], hospital for further evaluation and treatment. See below for further details  Brief Hospital Course: Chest pain: Completely resolved, no further chest pain in the past 24 hours. Retrospectively, now thought to be due to poststernotomy pain rather than possible pulmonary embolism. Note-Recent cardiac cath did not show any significant CAD  ?Pulmonary embolism: Although she did have chest pain and some shortness of breath-and CT angiogram done at outside hospital showed a possible small pulmonary embolism, she was subsequently evaluated by CT surgery during this hospital stay, Dr. Roxy Manns reviewed CT chest images reviewed by 2 different local radiologists-per CT surgery discussion-Radiology not sure if this is an acute pulmonary embolus-if it is a  pulmonary embolus it is likely a chronic embolus, but it also may be due to motion artifact. Given this diagnostic uncertainty-and the fact that when patient started having shortness of breath and chest pain, her INR was supratherapeutic-it is unlikely that pulmonary embolism is the cause of her presenting symptoms. Furthermore, lower extremity Dopplers are negative, 2-D echocardiogram does not show any RV strain. She currently is without chest pain and is not hypoxic (on room air). Agree with CT surgery, that her chest pain probably is related to her recent sternotomy, she could have some mild shortness of breath and anxiety from underlying pleural effusion, or from post sternotomy pain itself. She was initially started on IV heparin, but this is now been discontinued, she is back on Coumadin with therapeutic INR. Please note, this M.D. reached out to cardiology-Dr. Daneen Schick who reviewed the chart and suggested that we continue with coumadin, he was of the opinion that a NOAC was not indicated for valvular issues. Note, reached out to  cardiothoracic surgery today (spoke with Dr Guy Sandifer RN-who then spoke with Dr Roxy Manns in the OR)-okay to discharge from their point of view as well.  Chest pain: Recent cardiac cath did not show any significant CAD, on anticoagulation-see above regarding pulmonary embolism. Chest pain thought to be secondary to poststernotomy pain.  Recent hx of MR/TR repair with MAZE procedure:Appreciate CT surgery input-regarding coumadin vs NOAC, spoke with cardiology on call-Dr. Daneen Schick on 2/28-he reviewed the patient's chart, and suggested that we continue with coumadin, he was of the opinion that a NOAC was not indicated for valvular issues.   History of Chronic atrial fibrillation: Appears to be in sinus rhythm-s/p recent maze procedure-see above regarding anticoagulation issue. Rate controlled with  amiodarone.CHADS-VASc is 3. Patient asked to follow-up with Coumadin clinic as  previously scheduled on 3/5.  Chronic diastolic heart failure: Clinically compensated, continue Lasix.  GERD: Continue PPI  Normocytic anemia: Hemoglobin stable. No indication of blood loss. Follow.  OSA: CPAP daily at bedtime  Procedures/Studies: Echo>> - Compared to a prior study in 2017, the LVEF is unchaged. Although   there is moderate MAC and an elevated mitral mean gradient, the   left atrial size is normal, arguing against physiologically   significant mitral stenosis.   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 Ricard Dillon for next week -3/5  Please keep your appointment at the Coumadin clinic on 3/5  Please and other consultant's as instructed your Hospitalist MD  Please get a complete blood count and chemistry panel checked by your Primary MD at your next visit, and again as instructed by your Primary MD.  Get Medicines reviewed and adjusted: Please take all your medications with you for your next visit with your Primary MD  Laboratory/radiological data: Please request your Primary MD to go over all hospital  tests and procedure/radiological results at the follow up, please ask your Primary MD to get all Hospital records sent to his/her office.  In some cases, they will be blood work, cultures and biopsy results pending at the time of your discharge. Please request that your primary care M.D. follows up on these results.  Also Note the following: If you experience worsening of your admission symptoms, develop shortness of breath, life threatening emergency, suicidal or homicidal thoughts you must seek medical attention immediately by calling 911 or calling your MD immediately  if symptoms less severe.  You must read complete instructions/literature along with all the possible adverse reactions/side effects for all the Medicines you take and that have been prescribed to you. Take any new Medicines after you have completely understood and accpet all the possible adverse reactions/side effects.   Do not drive when taking Pain medications or sleeping medications (Benzodaizepines)  Do not take more than prescribed Pain, Sleep and Anxiety Medications. It is not advisable to combine anxiety,sleep and pain medications without talking with your primary care practitioner  Special Instructions: If you have smoked or chewed Tobacco  in the last 2 yrs please stop smoking, stop any regular Alcohol  and or any Recreational drug use.  Wear Seat belts while driving.  Please note: You were cared for by a hospitalist during your hospital stay. Once you are discharged, your primary care  physician will handle any further medical issues. Please note that NO REFILLS for any discharge medications will be authorized once you are discharged, as it is imperative that you return to your primary care physician (or establish a relationship with a primary care physician if you do not have one) for your post hospital discharge needs so that they can reassess your need for medications and monitor your lab values.   Increase activity  slowly    Complete by:  As directed      Allergies as of 01/07/2017      Reactions   Pradaxa [dabigatran Etexilate Mesylate] Shortness Of Breath, Other (See Comments)   Gastritis, possible SOB   Zocor [simvastatin] Other (See Comments)   MYALGIAS, LEG PAINS      Medication List    TAKE these medications   acetaminophen 500 MG tablet Commonly known as:  TYLENOL Take 1,000 mg by mouth every 6 (six) hours as needed for mild pain.   amiodarone 200 MG tablet Commonly known as:  PACERONE Take 1 tablet (200 mg total) by mouth 2 (two) times daily.   aspirin 81 MG EC tablet Take 1 tablet (81 mg total) by mouth daily.   atorvastatin 40 MG tablet Commonly known as:  LIPITOR Take 1 tablet (40 mg total) by mouth daily. What changed:  when to take this   esomeprazole 20 MG capsule Commonly known as:  NEXIUM Take 20 mg by mouth at bedtime.   furosemide 40 MG tablet Commonly known as:  LASIX Take 1 tablet (40 mg total) by mouth daily.   iron polysaccharides 150 MG capsule Commonly known as:  NIFEREX Take 1 capsule (150 mg total) by mouth daily.   lisinopril 2.5 MG tablet Commonly known as:  PRINIVIL,ZESTRIL Take 1 tablet (2.5 mg total) by mouth daily.   metoprolol succinate 25 MG 24 hr tablet Commonly known as:  TOPROL-XL Take 1 tablet (25 mg total) by mouth daily.   OVER THE COUNTER MEDICATION Inhale 1 application into the lungs at bedtime. CPAP   potassium chloride SA 20 MEQ tablet Commonly known as:  K-DUR,KLOR-CON Take 20 mEq by mouth daily.   traMADol 50 MG tablet Commonly known as:  ULTRAM Take 1 tablet (50 mg total) by mouth every 6 (six) hours as needed.   warfarin 4 MG tablet Commonly known as:  COUMADIN Take 1 tablet (4 mg total) by mouth daily at 6 PM. What changed:  how much to take  when to take this  additional instructions      Follow-up Information    Hoyt Koch, MD. Schedule an appointment as soon as possible for a visit in 1  week(s).   Specialty:  Internal Medicine Contact information: [place] [id number] [phone]        [name], MD Follow up.   Specialty:  Cardiothoracic Surgery Why:  Keep appointment at 3/5 Contact information: [address] [place] [address] [phone]          Allergies  Allergen Reactions  . Pradaxa [Dabigatran Etexilate Mesylate] Shortness Of Breath and Other (See Comments)    Gastritis, possible SOB  . Zocor [Simvastatin] Other (See Comments)    MYALGIAS, LEG PAINS     Consultations:   CTVS  Other Procedures/Studies: Dg Chest 2 View  Result Date: 01/06/2017 CLINICAL DATA:  Lab this is a follow-up are CHF hypertension, disease former smoker EXAM: CHEST  2 VIEW COMPARISON:  12/31/2016 FINDINGS: Upper  normal size of cardiac silhouette post LEFT atrial appendage clipping. Mediastinal contours and pulmonary vascularity normal. Mild RIGHT basilar atelectasis. Small bibasilar pleural effusions. Remaining lungs clear. No pneumothorax or acute osseous findings. IMPRESSION: Persistent small bibasilar pleural effusions and RIGHT basilar atelectasis. Electronically Signed   By: Lavonia Dana M.D.   On: 01/06/2017 08:01   Dg Chest 2 View  Result Date: 12/31/2016 CLINICAL DATA:  Two days of shortness of breath and sternal chest pain. Recent Maze procedure with mitral valve repair and tricuspid valve replacement. EXAM: CHEST  2 VIEW COMPARISON:  Chest x-ray of December 15, 2016 FINDINGS: The lungs are reasonably well inflated. There are small bilateral pleural effusions which are new. The interstitial markings of both lungs remain mildly increased but have improved since December 15, 2016. There is increased density in the retrocardiac region on the left consistent with atelectasis or infiltrate. The heart is not enlarged. The left atrial appendage clip is in stable position. The prosthetic valve-repair rings are visible in the tricuspid  and mitral positions. The mediastinum is normal in width. The sternal wires are intact. The bony thorax exhibits no acute abnormality. IMPRESSION: Persistent mild pulmonary interstitial edema and new small bilateral pleural effusions likely reflect CHF. Left lower lobe atelectasis or early pneumonia has also developed. Followup PA and lateral chest X-ray is recommended in 3-4 weeks following trial of antibiotic therapy to ensure resolution. Electronically Signed   By: David  Martinique M.D.   On: 12/31/2016 17:01   Dg Chest 2 View  Result Date: 12/15/2016 CLINICAL DATA:  Shortness of Breath EXAM: CHEST  2 VIEW COMPARISON:  December 14, 2016 FINDINGS: There is fairly mild underlying interstitial edema, similar to 1 day prior. The apparent atelectasis has cleared from the right base. On the lateral view, there are small pleural effusions. No new opacity is evident. Heart is upper normal in size with mild pulmonary venous hypertension. No evident adenopathy. Patient is status post mitral valve replacement and left atrial appendage clamp placement. No pneumothorax. No adenopathy. No bone lesions. Temporary pacemaker wires are attached to the right heart. IMPRESSION: There is a degree of underlying interstitial edema with equivocal pleural effusions bilaterally. No airspace consolidation. Stable cardiac silhouette. There is a degree of pulmonary venous hypertension. No pneumothorax. No new opacity. Electronically Signed   By: Lowella Grip III M.D.   On: 12/15/2016 08:28   Dg Chest 2 View  Result Date: 12/08/2016 CLINICAL DATA:  Heart valve repair. EXAM: CHEST  2 VIEW COMPARISON:  02/10/2016. FINDINGS: Mediastinum and hilar structures are normal. Mild cardiomegaly. No pulmonary venous congestion . Mild right base subsegmental atelectasis. No pleural effusion or pneumothorax . IMPRESSION: 1.  Mild right base subsegmental atelectasis. 2.  Mild cardiomegaly.  No pulmonary venous congestion . Electronically Signed    By: Marcello Moores  Register   On: 12/08/2016 14:34   Dg Chest Port 1 View  Result Date: 12/14/2016 CLINICAL DATA:  Shortness of breath. Status post mitral valve replacement. EXAM: PORTABLE CHEST 1 VIEW COMPARISON:  12/13/2016 and 12/08/2016 FINDINGS: There is progressive haziness throughout the right lung. Heart size and vascularity are normal in the left lung is clear. Chest tubes have been removed.  No pneumothorax. IMPRESSION: Slight progression of haziness in the right lung which probably represents a combination of slight residual pulmonary edema and atelectasis. Electronically Signed   By: Lorriane Shire M.D.   On: 12/14/2016 07:26   Dg Chest Port 1 View  Result Date: 12/13/2016 CLINICAL DATA:  Mitral  valve repair EXAM: PORTABLE CHEST 1 VIEW COMPARISON:  December 12, 2016 FINDINGS: Stable support apparatus. Persistent pulmonary edema, similar to slightly worsened in the interval. No other change. IMPRESSION: Stable support apparatus with stable to mildly worsened pulmonary edema. Electronically Signed   By: Dorise Bullion III M.D   On: 12/13/2016 07:52   Dg Chest Port 1 View  Result Date: 12/12/2016 CLINICAL DATA:  Atelectasis. EXAM: PORTABLE CHEST 1 VIEW COMPARISON:  Radiograph of December 11, 2016. FINDINGS: Stable cardiomediastinal silhouette. Sternotomy wires are noted. Left-sided chest tube is again noted and unchanged with no pneumothorax. Right-sided chest tube is also unchanged without evidence of pneumothorax. Mild right basilar subsegmental atelectasis or infiltrate is noted. Right internal jugular venous sheath remains. Swan-Ganz catheter has been removed. Mediastinal drain remains in position. IMPRESSION: Bilateral chest tubes are noted without pneumothorax. Mild right basilar subsegmental atelectasis or infiltrate is noted. Electronically Signed   By: Marijo Conception, M.D.   On: 12/12/2016 09:05   Dg Chest Port 1 View  Result Date: 12/11/2016 CLINICAL DATA:  Status post mitral valve  replacement, chest tube. EXAM: PORTABLE CHEST 1 VIEW COMPARISON:  Chest radiograph from one day prior. FINDINGS: Right internal jugular Swan-Ganz catheter terminates over the right pulmonary artery. Stable position of bilateral chest tubes, mediastinal drains, median sternotomy wires and cardiac valve annuloplasty ring. Stable cardiomediastinal silhouette with top-normal heart size. No pneumothorax. No pleural effusion. Stable low lung volumes. No overt pulmonary edema. Stable mild bibasilar atelectasis. IMPRESSION: Support structures in place.  No pneumothorax. Stable low lung volumes with mild bibasilar atelectasis. Electronically Signed   By: Ilona Sorrel M.D.   On: 12/11/2016 07:41   Dg Chest Port 1 View  Result Date: 12/10/2016 CLINICAL DATA:  Status post cardiac surgery EXAM: PORTABLE CHEST 1 VIEW COMPARISON:  12/08/2016 FINDINGS: Postsurgical changes are noted. Swan-Ganz catheter is noted in the right pulmonary artery. Bilateral thoracostomy catheters as well as a pericardial and mediastinal drain are seen. Nasogastric catheter is noted within the stomach. Endotracheal tube is seen in satisfactory position. The overall inspiratory effort is poor with minimal basilar atelectasis. No pneumothorax is seen. IMPRESSION: Tubes and lines as described. Minimal bibasilar atelectasis. Electronically Signed   By: Inez Catalina M.D.   On: 12/10/2016 15:32      TODAY-DAY OF DISCHARGE:  Subjective:   T[place]alaya Perren today has no headache,no chest abdominal pain,no new weakness tingling or numbness, feels much better wants to go home today.   Objective:   Blood pressure (!) 118/59, pulse 92, temperature 97.7 F (36.5 C), temperature source Axillary, resp. rate 14, height _0  (1.676 m), weight 81.7 kg (180 lb 1.6 oz), last menstrual period 11/09/2008, SpO2 96 %.  Intake/Output Summary (Last 24 hours) at 01/07/17 0933 Last data filed at 01/06/17 2100  Gross per 24 hour  Intake              340 ml    Output                2 ml  Net              338 ml   Filed Weights   01/05/17 1859 01/07/17 0625  Weight: 98.9 kg (218 lb) 81.7 kg (180 lb 1.6 oz)    Exam: Awake Alert, Oriented *3, No new F.N deficits, Normal affect [place].AT,PERRAL Supple Neck,No JVD, No cervical lymphadenopathy appriciated.  Symmetrical Chest wall movement, Good air movement bilaterally, CTAB RRR,No Gallops,Rubs or new Murmurs, No Parasternal  Heave +ve B.Sounds, Abd Soft, Non tender, No organomegaly appriciated, No rebound -guarding or rigidity. No Cyanosis, Clubbing or edema, No new Rash or bruise   PERTINENT RADIOLOGIC STUDIES: Dg Chest 2 View  Result Date: 01/06/2017 CLINICAL DATA:  Lab this is a follow-up are CHF hypertension, disease former smoker EXAM: CHEST  2 VIEW COMPARISON:  12/31/2016 FINDINGS: Upper normal size of cardiac silhouette post LEFT atrial appendage clipping. Mediastinal contours and pulmonary vascularity normal. Mild RIGHT basilar atelectasis. Small bibasilar pleural effusions. Remaining lungs clear. No pneumothorax or acute osseous findings. IMPRESSION: Persistent small bibasilar pleural effusions and RIGHT basilar atelectasis. Electronically Signed   By: Lavonia Dana M.D.   On: 01/06/2017 08:01   Dg Chest 2 View  Result Date: 12/31/2016 CLINICAL DATA:  Two days of shortness of breath and sternal chest pain. Recent Maze procedure with mitral valve repair and tricuspid valve replacement. EXAM: CHEST  2 VIEW COMPARISON:  Chest x-ray of December 15, 2016 FINDINGS: The lungs are reasonably well inflated. There are small bilateral pleural effusions which are new. The interstitial markings of both lungs remain mildly increased but have improved since December 15, 2016. There is increased density in the retrocardiac region on the left consistent with atelectasis or infiltrate. The heart is not enlarged. The left atrial appendage clip is in stable position. The prosthetic valve-repair rings are visible in the  tricuspid and mitral positions. The mediastinum is normal in width. The sternal wires are intact. The bony thorax exhibits no acute abnormality. IMPRESSION: Persistent mild pulmonary interstitial edema and new small bilateral pleural effusions likely reflect CHF. Left lower lobe atelectasis or early pneumonia has also developed. Followup PA and lateral chest X-ray is recommended in 3-4 weeks following trial of antibiotic therapy to ensure resolution. Electronically Signed   By: David  Martinique M.D.   On: 12/31/2016 17:01   Dg Chest 2 View  Result Date: 12/15/2016 CLINICAL DATA:  Shortness of Breath EXAM: CHEST  2 VIEW COMPARISON:  December 14, 2016 FINDINGS: There is fairly mild underlying interstitial edema, similar to 1 day prior. The apparent atelectasis has cleared from the right base. On the lateral view, there are small pleural effusions. No new opacity is evident. Heart is upper normal in size with mild pulmonary venous hypertension. No evident adenopathy. Patient is status post mitral valve replacement and left atrial appendage clamp placement. No pneumothorax. No adenopathy. No bone lesions. Temporary pacemaker wires are attached to the right heart. IMPRESSION: There is a degree of underlying interstitial edema with equivocal pleural effusions bilaterally. No airspace consolidation. Stable cardiac silhouette. There is a degree of pulmonary venous hypertension. No pneumothorax. No new opacity. Electronically Signed   By: Lowella Grip III M.D.   On: 12/15/2016 08:28   Dg Chest 2 View  Result Date: 12/08/2016 CLINICAL DATA:  Heart valve repair. EXAM: CHEST  2 VIEW COMPARISON:  02/10/2016. FINDINGS: Mediastinum and hilar structures are normal. Mild cardiomegaly. No pulmonary venous congestion . Mild right base subsegmental atelectasis. No pleural effusion or pneumothorax . IMPRESSION: 1.  Mild right base subsegmental atelectasis. 2.  Mild cardiomegaly.  No pulmonary venous congestion . Electronically  Signed   By: Marcello Moores  Register   On: 12/08/2016 14:34   Dg Chest Port 1 View  Result Date: 12/14/2016 CLINICAL DATA:  Shortness of breath. Status post mitral valve replacement. EXAM: PORTABLE CHEST 1 VIEW COMPARISON:  12/13/2016 and 12/08/2016 FINDINGS: There is progressive haziness throughout the right lung. Heart size and vascularity are normal in the  left lung is clear. Chest tubes have been removed.  No pneumothorax. IMPRESSION: Slight progression of haziness in the right lung which probably represents a combination of slight residual pulmonary edema and atelectasis. Electronically Signed   By: Lorriane Shire M.D.   On: 12/14/2016 07:26   Dg Chest Port 1 View  Result Date: 12/13/2016 CLINICAL DATA:  Mitral valve repair EXAM: PORTABLE CHEST 1 VIEW COMPARISON:  December 12, 2016 FINDINGS: Stable support apparatus. Persistent pulmonary edema, similar to slightly worsened in the interval. No other change. IMPRESSION: Stable support apparatus with stable to mildly worsened pulmonary edema. Electronically Signed   By: Dorise Bullion III M.D   On: 12/13/2016 07:52   Dg Chest Port 1 View  Result Date: 12/12/2016 CLINICAL DATA:  Atelectasis. EXAM: PORTABLE CHEST 1 VIEW COMPARISON:  Radiograph of December 11, 2016. FINDINGS: Stable cardiomediastinal silhouette. Sternotomy wires are noted. Left-sided chest tube is again noted and unchanged with no pneumothorax. Right-sided chest tube is also unchanged without evidence of pneumothorax. Mild right basilar subsegmental atelectasis or infiltrate is noted. Right internal jugular venous sheath remains. Swan-Ganz catheter has been removed. Mediastinal drain remains in position. IMPRESSION: Bilateral chest tubes are noted without pneumothorax. Mild right basilar subsegmental atelectasis or infiltrate is noted. Electronically Signed   By: Marijo Conception, M.D.   On: 12/12/2016 09:05   Dg Chest Port 1 View  Result Date: 12/11/2016 CLINICAL DATA:  Status post mitral valve  replacement, chest tube. EXAM: PORTABLE CHEST 1 VIEW COMPARISON:  Chest radiograph from one day prior. FINDINGS: Right internal jugular Swan-Ganz catheter terminates over the right pulmonary artery. Stable position of bilateral chest tubes, mediastinal drains, median sternotomy wires and cardiac valve annuloplasty ring. Stable cardiomediastinal silhouette with top-normal heart size. No pneumothorax. No pleural effusion. Stable low lung volumes. No overt pulmonary edema. Stable mild bibasilar atelectasis. IMPRESSION: Support structures in place.  No pneumothorax. Stable low lung volumes with mild bibasilar atelectasis. Electronically Signed   By: Ilona Sorrel M.D.   On: 12/11/2016 07:41   Dg Chest Port 1 View  Result Date: 12/10/2016 CLINICAL DATA:  Status post cardiac surgery EXAM: PORTABLE CHEST 1 VIEW COMPARISON:  12/08/2016 FINDINGS: Postsurgical changes are noted. Swan-Ganz catheter is noted in the right pulmonary artery. Bilateral thoracostomy catheters as well as a pericardial and mediastinal drain are seen. Nasogastric catheter is noted within the stomach. Endotracheal tube is seen in satisfactory position. The overall inspiratory effort is poor with minimal basilar atelectasis. No pneumothorax is seen. IMPRESSION: Tubes and lines as described. Minimal bibasilar atelectasis. Electronically Signed   By: Inez Catalina M.D.   On: 12/10/2016 15:32     PERTINENT LAB RESULTS: CBC:  Recent Labs  01/06/17 0125 01/07/17 0813  WBC 8.3 7.0  HGB 9.5* 10.1*  HCT 30.4* 32.3*  PLT 404* 414*   CMET CMP     Component Value Date/Time   NA 135 01/06/2017 0125   K 3.5 01/06/2017 0125   CL 97 (L) 01/06/2017 0125   CO2 29 01/06/2017 0125   GLUCOSE 120 (H) 01/06/2017 0125   BUN 18 01/06/2017 0125   CREATININE 0.87 01/06/2017 0125   CREATININE 0.81 12/31/2016 1517   CALCIUM 9.1 01/06/2017 0125   PROT 6.0 (L) 01/06/2017 0125   ALBUMIN 2.9 (L) 01/06/2017 0125   AST 19 01/06/2017 0125   ALT 19  01/06/2017 0125   ALKPHOS 100 01/06/2017 0125   BILITOT 0.4 01/06/2017 0125   GFRNONAA >60 01/06/2017 0125   GFRAA >60 01/06/2017  0125    GFR Estimated Creatinine Clearance: 80.6 mL/min (by C-G formula based on SCr of 0.87 mg/dL). No results for input(s): LIPASE, AMYLASE in the last 72 hours.  Recent Labs  01/05/17 2053 01/06/17 0125 01/06/17 0757  TROPONINI 0.04* 0.05* 0.04*   Invalid input(s): POCBNP No results for input(s): DDIMER in the last 72 hours. No results for input(s): HGBA1C in the last 72 hours. No results for input(s): CHOL, HDL, LDLCALC, TRIG, CHOLHDL, LDLDIRECT in the last 72 hours. No results for input(s): TSH, T4TOTAL, T3FREE, THYROIDAB in the last 72 hours.  Invalid input(s): FREET3 No results for input(s): VITAMINB12, FOLATE, FERRITIN, TIBC, IRON, RETICCTPCT in the last 72 hours. Coags:  Recent Labs  01/06/17 0125 01/07/17 0813  INR 2.55 2.40   Microbiology: No results found for this or any previous visit (from the past 240 hour(s)).  FURTHER DISCHARGE INSTRUCTIONS:  Get Medicines reviewed and adjusted: Please take all your medications with you for your next visit with your Primary MD  Laboratory/radiological data: Please request your Primary MD to go over all hospital tests and procedure/radiological results at the follow up, please ask your Primary MD to get all Hospital records sent to his/her office.  In some cases, they will be blood work, cultures and biopsy results pending at the time of your discharge. Please request that your primary care M.D. goes through all the records of your hospital data and follows up on these results.  Also Note the following: If you experience worsening of your admission symptoms, develop shortness of breath, life threatening emergency, suicidal or homicidal thoughts you must seek medical attention immediately by calling 911 or calling your MD immediately  if symptoms less severe.  You must read complete  instructions/literature along with all the possible adverse reactions/side effects for all the Medicines you take and that have been prescribed to you. Take any new Medicines after you have completely understood and accpet all the possible adverse reactions/side effects.   Do not drive when taking Pain medications or sleeping medications (Benzodaizepines)  Do not take more than prescribed Pain, Sleep and Anxiety Medications. It is not advisable to combine anxiety,sleep and pain medications without talking with your primary care practitioner  Special Instructions: If you have smoked or chewed Tobacco  in the last 2 yrs please stop smoking, stop any regular Alcohol  and or any Recreational drug use.  Wear Seat belts while driving.  Please note: You were cared for by a hospitalist during your hospital stay. Once you are discharged, your primary care physician will handle any further medical issues. Please note that NO REFILLS for any discharge medications will be authorized once you are discharged, as it is imperative that you return to your primary care physician (or establish a relationship with a primary care physician if you do not have one) for your post hospital discharge needs so that they can reassess your need for medications and monitor your lab values.  Total Time spent coordinating discharge including counseling, education and face to face time equals 45 minutes.  SignedOren Binet 01/07/2017 9:33 AM

## 2017-01-11 ENCOUNTER — Ambulatory Visit (INDEPENDENT_AMBULATORY_CARE_PROVIDER_SITE_OTHER): Payer: Self-pay | Admitting: Thoracic Surgery (Cardiothoracic Vascular Surgery)

## 2017-01-11 ENCOUNTER — Ambulatory Visit (INDEPENDENT_AMBULATORY_CARE_PROVIDER_SITE_OTHER): Payer: Medicare HMO | Admitting: Pharmacist

## 2017-01-11 ENCOUNTER — Other Ambulatory Visit: Payer: Self-pay

## 2017-01-11 ENCOUNTER — Ambulatory Visit
Admission: RE | Admit: 2017-01-11 | Discharge: 2017-01-11 | Disposition: A | Discharge status: Home / Self Care | Payer: Medicare HMO | Source: Ambulatory Visit | Attending: Thoracic Surgery (Cardiothoracic Vascular Surgery) | Admitting: Thoracic Surgery (Cardiothoracic Vascular Surgery)

## 2017-01-11 ENCOUNTER — Encounter: Payer: Self-pay | Admitting: Thoracic Surgery (Cardiothoracic Vascular Surgery)

## 2017-01-11 VITALS — BP 120/64 | HR 84 | Resp 20 | Ht 66.0 in | Wt 216.0 lb

## 2017-01-11 DIAGNOSIS — J90 Pleural effusion, not elsewhere classified: Secondary | ICD-10-CM | POA: Diagnosis not present

## 2017-01-11 DIAGNOSIS — Z9889 Other specified postprocedural states: Principal | ICD-10-CM

## 2017-01-11 DIAGNOSIS — Z8679 Personal history of other diseases of the circulatory system: Secondary | ICD-10-CM

## 2017-01-11 LAB — POCT INR: INR: 7.9

## 2017-01-11 MED ORDER — LISINOPRIL 2.5 MG PO TABS: 2.5000 mg | ORAL_TABLET | Freq: Every day | ORAL | 1 refills | Status: DC

## 2017-01-11 MED ORDER — AMIODARONE HCL 200 MG PO TABS: 200.0000 mg | ORAL_TABLET | Freq: Two times a day (BID) | ORAL | 0 refills | Status: DC

## 2017-01-11 MED ORDER — WARFARIN SODIUM 4 MG PO TABS: ORAL_TABLET | ORAL | 0 refills | Status: DC

## 2017-01-11 MED ORDER — AMIODARONE HCL 200 MG PO TABS: 200.0000 mg | ORAL_TABLET | Freq: Every day | ORAL | 0 refills | Status: DC

## 2017-01-11 NOTE — Patient Instructions (Signed)
Stop taking aspirin now that you are therapeutic on warfarin  Continue to follow up closely in the Coumadin clinic to adjust your warfarin dose  Decrease your dose of amiodarone to 200 mg daily  Continue all other previous medications without any changes at this time  Continue to avoid any heavy lifting or strenuous use of your arms or shoulders for at least a total of three months from the time of surgery.  After three months you may gradually increase how much you lift or otherwise use your arms or chest as tolerated, with limits based upon whether or not activities lead to the return of significant discomfort.  You may return to driving an automobile as long as you are no longer requiring oral narcotic pain relievers during the daytime.  It would be wise to start driving only short distances during the daylight and gradually increase from there as you feel comfortable.  You are encouraged to enroll and participate in the outpatient cardiac rehab program beginning as soon as practical.

## 2017-01-11 NOTE — Progress Notes (Signed)
[address]       [place] [address]             [phone]     CARDIOTHORACIC SURGERY OFFICE NOTE  Referring Provider is Thompson Grayer, MD  Primary Cardiologist is [name], MD PCP is Hoyt Koch, MD   HPI:  Patient is a 56 year old morbidly obese female with history of long-standing persistent persistent atrial fibrillation, mitral regurgitation, tricuspid regurgitation, chronic diastolic congestive heart failure, hypertension, obstructive sleep apnea, peripheral arterial disease, post traumatic stress disorder, and remote history of long-standing tobacco abuse who returns to the office today for follow-up status post mitral valve repair, tricuspid valve repair, and Maze procedure on 12/10/2016.  Her early postoperative recovery in the hospital was essentially uncomplicated although she did require transfusion for acute blood loss anemia. She was ultimately discharged home on the eighth postoperative day.  She initially did fairly well although approximately 2 weeks after hospital discharge she began to experience increased pain in the left side of her chest that was made worse with movement, deep inspiration, or coughing.  On 01/05/2017 she woke up with particular severe pain on the left side of her chest that made it very difficult to take a deep breath. She became more anxious and short of breath and ultimately went to the emergency department in [place] where a CT angiogram of the chest was performed. This was interpreted as to demonstrate the presence of pulmonary embolus, and the patient was transferred to [facility] for admission. Prior to admission the patient had been notedly supratherapeutic on warfarin with INR measured 11 on thyroid 22 2018. At the time of hospital admission the INR was 2.8. The patient's chest pain resolved fairly quickly. Review of the CT scan revealed a very small abnormality noted on only one cut of the  scan in the pulmonary artery of the left lung consistent with either motion artifact or possibly a very small old pulmonary embolus. There was nothing seen on the CT scan felt to be consistent with an acute pulmonary embolus.  Follow-up transthoracic echocardiogram performed during her hospitalization revealed normal left ventricular systolic function with ejection fraction estimated 55-60%. There was moderate left ventricular hypertrophy. The mitral valve repair appeared intact with very mild residual regurgitation. There was moderate mitral annular calcification and somewhat elevated transvalvular gradient with valve area by pressure half-time estimated 2.06 cm. The patient was discharged home and returns to our office today for follow-up. She states that since hospital discharge she has been doing very well. She has been very careful with physical activity not to do things that exacerbate pain in her chest. She states that it is feeling much better and for the most part just feels mildly sore in the upper portion of her sternum. Pain is elicited by coughing, sneezing, or shivering. She is sleeping fairly well at night. Her shortness of breath has improved considerably. She is walking every day and states that her breathing continues to improve. Appetite is improved. Her INR was checked this morning and remains very high at 7.9. She was instructed not to take Coumadin for the next 2 days and resume at a lower dose. She is being followed carefully through the Coumadin clinic at [facility].   Current Outpatient Prescriptions  Medication Sig Dispense Refill  . acetaminophen (TYLENOL) 500 MG tablet Take 1,000 mg by mouth every 6 (six) hours as needed for mild pain.    [name]  amiodarone (PACERONE) 200 MG tablet Take 1 tablet (200 mg total) by mouth 2 (two) times daily. 180 tablet 0  . aspirin EC 81 MG EC tablet Take 1 tablet (81 mg total) by mouth daily.    [name] atorvastatin (LIPITOR) 40 MG tablet Take 1 tablet  (40 mg total) by mouth daily. (Patient taking differently: Take 40 mg by mouth daily at 6 PM. ) 90 tablet 3  . esomeprazole (NEXIUM) 20 MG capsule Take 20 mg by mouth at bedtime.     . furosemide (LASIX) 40 MG tablet Take 1 tablet (40 mg total) by mouth daily. 90 tablet 3  . iron polysaccharides (NIFEREX) 150 MG capsule Take 1 capsule (150 mg total) by mouth daily. 30 capsule 1  . lisinopril (PRINIVIL,ZESTRIL) 2.5 MG tablet Take 1 tablet (2.5 mg total) by mouth daily. 90 tablet 1  . metoprolol succinate (TOPROL-XL) 25 MG 24 hr tablet Take 1 tablet (25 mg total) by mouth daily. 30 tablet 1  . OVER THE COUNTER MEDICATION Inhale 1 application into the lungs at bedtime. CPAP    . potassium chloride SA (K-DUR,KLOR-CON) 20 MEQ tablet Take 20 mEq by mouth daily.    . traMADol (ULTRAM) 50 MG tablet Take 1 tablet (50 mg total) by mouth every 6 (six) hours as needed. 20 tablet 0  . warfarin (COUMADIN) 4 MG tablet Take 1 tablet daily or as directed by coumadin clinic 90 tablet 0   No current facility-administered medications for this visit.       Physical Exam:   BP 120/64   Pulse 84   Resp 20   Ht _0  (1.676 m)   Wt 216 lb (98 kg)   LMP 11/09/2008   SpO2 97%   BMI 34.86 kg/m   General:  Obese but well appearing  Chest:   Clear to auscultation  CV:   Regular rate and rhythm without murmur  Incisions:  Healing nicely, sternum is stable  Abdomen:  Soft nontender  Extremities:  Warm and well-perfused  Diagnostic Tests:  Transthoracic Echocardiography  Patient:    Kayla Hopkins, Kayla Hopkins MR #:       [id number] Study Date: 01/06/2017 Gender:     F Age:        48 Height:     167.6 cm Weight:     98.9 kg BSA:        2.19 m^2 Pt. Status: Room:       [place], M.D.  SONOGRAPHER  Diamond Nickel  ATTENDING    Waldemar Dickens  PERFORMING   [facility], Inpatient  ADMITTING    Mitzi Hansen  S  cc:  ------------------------------------------------------------------- LV EF: 55% -   60%  ------------------------------------------------------------------- Indications:      MVD [non-rheumatic] 424.0.  ------------------------------------------------------------------- History:   PMH:  Pulmonary embolus - 415.9.  Atrial fibrillation. Coronary artery disease.  Congestive heart failure.  Risk factors: Hypertension. Dyslipidemia.  ------------------------------------------------------------------- Study Conclusions  - Left ventricle: The cavity size was normal. Wall thickness was   increased in a pattern of moderate LVH. Systolic function was   normal. The estimated ejection fraction was in the range of 55%   to 60%. The study is not technically sufficient to allow   evaluation of LV diastolic function. - Aortic valve: Mildly calcified leaflets. There was no stenosis.   There was no significant regurgitation. - Mitral valve: Calcified annulus. Mildly thickened leaflets .   There was mild regurgitation.  Valve area by pressure half-time:   2.06 cm^2. - Left atrium: The atrium was normal in size. - Tricuspid valve: There was mild regurgitation. - Pulmonary arteries: PA peak pressure: 45 mm Hg (S). - Inferior vena cava: The vessel was normal in size. The   respirophasic diameter changes were in the normal range (= 50%),   consistent with normal central venous pressure.  Impressions:  - Compared to a prior study in 2017, the LVEF is unchaged. Although   there is moderate MAC and an elevated mitral mean gradient, the   left atrial size is normal, arguing against physiologically   significant mitral stenosis.  ------------------------------------------------------------------- Study data:  Comparison was made to the study of 12/10/2016.  Study status:  Routine.  Procedure:  Transthoracic echocardiography. Image quality was adequate. The study was technically  difficult, as a result of restricted patient mobility.  Study completion:  There were no complications.          Transthoracic echocardiography. M-mode, complete 2D, spectral Doppler, and color Doppler. Birthdate:  Patient birthdate: February 26, 1961.  Age:  Patient is 57 yr old.  Sex:  Gender: female.    BMI: 35.2 kg/m^2.  Blood pressure:   113/62  Patient status:  Inpatient.  Study date:  Study date: 01/06/2017. Study time: 11:22 AM.  Location:  Echo laboratory.  -------------------------------------------------------------------  ------------------------------------------------------------------- Left ventricle:  The cavity size was normal. Wall thickness was increased in a pattern of moderate LVH. Systolic function was normal. The estimated ejection fraction was in the range of 55% to 60%. Images were inadequate for LV wall motion assessment. The study is not technically sufficient to allow evaluation of LV diastolic function.  ------------------------------------------------------------------- Aortic valve:   Mildly calcified leaflets.  Doppler:   There was no stenosis.   There was no significant regurgitation.  ------------------------------------------------------------------- Aorta:  Aortic root: The aortic root was normal in size. Ascending aorta: The ascending aorta was normal in size.  ------------------------------------------------------------------- Mitral valve:   Calcified annulus. Mildly thickened leaflets . Doppler:  There was mild regurgitation.    Valve area by pressure half-time: 2.06 cm^2. Indexed valve area by pressure half-time: 0.94 cm^2/m^2.    Mean gradient (D): 14 mm Hg. Peak gradient (D): 21 mm Hg.  ------------------------------------------------------------------- Left atrium:  The atrium was normal in size.  ------------------------------------------------------------------- Atrial septum:  Poorly  visualized.  ------------------------------------------------------------------- Right ventricle:  The cavity size was normal. Wall thickness was normal. Systolic function was normal.  ------------------------------------------------------------------- Pulmonic valve:    Doppler:  There was no significant regurgitation.  ------------------------------------------------------------------- Tricuspid valve:   Doppler:  There was mild regurgitation.  ------------------------------------------------------------------- Pulmonary artery:   The main pulmonary artery was normal-sized.  ------------------------------------------------------------------- Right atrium:  The atrium was normal in size.  ------------------------------------------------------------------- Pericardium:  There was no pericardial effusion.  ------------------------------------------------------------------- Systemic veins: Inferior vena cava: The vessel was normal in size. The respirophasic diameter changes were in the normal range (= 50%), consistent with normal central venous pressure.  ------------------------------------------------------------------- Measurements   Left ventricle                         Value          Reference  LV ID, ED, PLAX chordal        (L)     37.9  mm       43 - 52  LV ID, ES, PLAX chordal  28.4  mm       23 - 38  LV fx shortening, PLAX chordal (L)     25    %        >=29  LV PW thickness, ED                    11.1  mm       ----------  IVS/LV PW ratio, ED                    1.24           <=1.3    Ventricular septum                     Value          Reference  IVS thickness, ED                      13.8  mm       ----------    LVOT                                   Value          Reference  LVOT ID, S                             17    mm       ----------  LVOT area                              2.27  cm^2     ----------    Aorta                                   Value          Reference  Aortic root ID, ED                     22    mm       ----------    Left atrium                            Value          Reference  LA ID, A-P, ES                         40    mm       ----------  LA ID/bsa, A-P                         1.83  cm/m^2   <=2.2  LA volume, S                           54.3  ml       ----------  LA volume/bsa, S                       24.8  ml/m^2   ----------  LA volume, ES, 1-p A4C  50    ml       ----------  LA volume/bsa, ES, 1-p A4C             22.9  ml/m^2   ----------  LA volume, ES, 1-p A2C                 58    ml       ----------  LA volume/bsa, ES, 1-p A2C             26.5  ml/m^2   ----------    Mitral valve                           Value          Reference  Mitral E-wave peak velocity            231   cm/s     ----------  Mitral A-wave peak velocity            159   cm/s     ----------  Mitral mean velocity, D                175   cm/s     ----------  Mitral deceleration time       (H)     380   ms       150 - 230  Mitral pressure half-time              107   ms       ----------  Mitral mean gradient, D                14    mm Hg    ----------  Mitral peak gradient, D                21    mm Hg    ----------  Mitral E/A ratio, peak                 1.5            ----------  Mitral valve area, PHT, DP             2.06  cm^2     ----------  Mitral valve area/bsa, PHT, DP         0.94  cm^2/m^2 ----------  Mitral annulus VTI, D                  82.6  cm       ----------    Pulmonary arteries                     Value          Reference  PA pressure, S, DP             (H)     45    mm Hg    <=30    Tricuspid valve                        Value          Reference  Tricuspid regurg peak velocity         324   cm/s     ----------  Tricuspid peak RV-RA gradient          42    mm Hg    ----------    Right atrium  Value          Reference  RA ID, S-I, ES, A4C                     44.5  mm       34 - 49  RA area, ES, A4C                       11.6  cm^2     8.3 - 19.5  RA volume, ES, A/L                     24.8  ml       ----------  RA volume/bsa, ES, A/L                 11.3  ml/m^2   ----------    Systemic veins                         Value          Reference  Estimated CVP                          3     mm Hg    ----------    Right ventricle                        Value          Reference  RV pressure, S, DP             (H)     45    mm Hg    <=30  RV s&', lateral, S                      9.25  cm/s     ----------  Legend: (L)  and  (H)  mark values outside specified reference range.  ------------------------------------------------------------------- Prepared and Electronically Authenticated by  Lyman Bishop MD 2018-02-28T16:04:19   2 channel telemetry rhythm strip demonstrates normal sinus rhythm   CHEST  2 VIEW  COMPARISON:  PA and lateral chest 01/06/2017 and 12/31/2016.  FINDINGS: Small bilateral pleural effusions, larger on the left, have slightly decreased in size since the most recent examination. There is cardiomegaly and mild vascular congestion. No consolidative process or pneumothorax. 8 intact median sternotomy wires, clip for the left atrial appendage and annuloplasty band for the mitral valve are unchanged.  IMPRESSION: Some decrease in small bilateral pleural effusions.  Mild pulmonary vascular congestion.   Electronically Signed   By: Inge Rise M.D.   On: 01/11/2017 09:52   Impression:  Patient is doing well approximately one month status post mitral valve repair, tricuspid valve repair, and Maze procedure. She is maintaining sinus rhythm and clinically improving every day. She was recently rehospitalized briefly because of an acute episode of chest pain that was probably musculoskeletal in origin and related to the patient's recent sternotomy. There was question raised at that time regarding the  possibility of a pulmonary embolus, although subsequent review of the CT scan suggested that the abnormality may have either been motion artifact or perhaps a small old pulmonary embolus that had resolved. Findings were not felt to be consistent with acute pulmonary embolus and the patient was actually supratherapeutic on warfarin at the time of the event. The patient now seems to be doing much  better although her Coumadin dosing remains problematic. Follow-up echocardiogram performed during her recent hospitalization revealed intact mitral valve repair. Transvalvular gradient across the valve was somewhat elevated which is not unexpected given the relatively small size of her annuloplasty ring in the setting of likely elevated cardiac output during the early postoperative period.    Plan:  I have encouraged the patient to continue to gradually increase her physical activity as tolerated with her limitations remaining that she refrain from any sort of heavy lifting or strenuous use of her arms or shoulders for at least another 2 months. I've instructed her to decrease her dose of amiodarone to 200 mg a day. I would hope to stop amiodarone within the next 4-6 weeks if she continues to maintain sinus rhythm. We have not recommended any other changes to her current medications at this time. I feel that the patient would likely benefit from participation in the outpatient cardiac rehabilitation program. All of her questions have been addressed. The patient will return to our office for follow-up and rhythm check in approximately 4-6 weeks.  She will call and return sooner should specific problems or questions arise.    Valentina Gu. Roxy Manns, MD 01/11/2017 10:31 AM

## 2017-01-11 NOTE — Patient Outreach (Signed)
[place] [facility]) Care Management  01/11/2017  Kayla Hopkins Jan 24, 1961 [id number]  REFERRAL DATE: 01/11/17 REFERRAL SOURCE: Hospital liaison referral / status post hospital admission 01/05/17 - 01/07/17 REFERRAL REASON: Transition of care follow up  Telephone call to patient for transition of care referral. Unable to reach patient. HIPAA compliant voice message left with call back phone number.   PLAN; RNCM will attempt 2nd telephone call to patient within  1 week.   Quinn Plowman RN,BSN,CCM [facility] Telephonic  [phone]

## 2017-01-12 ENCOUNTER — Other Ambulatory Visit: Payer: Self-pay

## 2017-01-12 NOTE — Patient Outreach (Signed)
[facility]) Care Management  01/12/2017  Kayla Hopkins 08-Jul-1961 [id number]  REFERRAL DATE: 01/11/17 REFERRAL SOURCE: Hospital liaison referral / status post hospital admission 01/05/17 - 01/07/17 REFERRAL REASON: Transition of care follow up  Second telephone call to patient for transition of care.  Unable to reach patient. HIPAA compliant voice message left on listed home and mobile number with call back request and contact phone number.  PLAN; RNCM will attempt 3rd telephone outreach to patient within 1 week .  Quinn Plowman RN,BSN,CCM [facility] Telephonic  [phone]

## 2017-01-13 ENCOUNTER — Ambulatory Visit: Payer: Self-pay

## 2017-01-13 ENCOUNTER — Inpatient Hospital Stay: Payer: Self-pay | Admitting: Internal Medicine

## 2017-01-13 ENCOUNTER — Ambulatory Visit: Payer: Commercial Managed Care - HMO | Admitting: Internal Medicine

## 2017-01-14 ENCOUNTER — Encounter: Payer: Self-pay | Admitting: *Deleted

## 2017-01-14 NOTE — Progress Notes (Signed)
A referral and records were faxed to [facility] to contact Mrs. Fiscus to start [place] II per the request of Dr. Roxy Manns.

## 2017-01-19 ENCOUNTER — Ambulatory Visit (INDEPENDENT_AMBULATORY_CARE_PROVIDER_SITE_OTHER): Payer: Medicare HMO | Admitting: Pharmacist

## 2017-01-19 DIAGNOSIS — Z9889 Other specified postprocedural states: Secondary | ICD-10-CM | POA: Diagnosis not present

## 2017-01-19 LAB — POCT INR: INR: 5.1

## 2017-01-26 ENCOUNTER — Ambulatory Visit: Payer: Self-pay | Admitting: Cardiovascular Disease

## 2017-01-26 DIAGNOSIS — G4733 Obstructive sleep apnea (adult) (pediatric): Secondary | ICD-10-CM | POA: Diagnosis not present

## 2017-01-28 ENCOUNTER — Ambulatory Visit (INDEPENDENT_AMBULATORY_CARE_PROVIDER_SITE_OTHER): Payer: Medicare HMO | Admitting: *Deleted

## 2017-01-28 ENCOUNTER — Encounter (HOSPITAL_COMMUNITY): Payer: Self-pay

## 2017-01-28 ENCOUNTER — Ambulatory Visit (HOSPITAL_COMMUNITY): Payer: Medicare HMO

## 2017-01-28 DIAGNOSIS — Z9889 Other specified postprocedural states: Secondary | ICD-10-CM

## 2017-01-28 LAB — POCT INR: INR: 2.4

## 2017-02-02 ENCOUNTER — Ambulatory Visit (INDEPENDENT_AMBULATORY_CARE_PROVIDER_SITE_OTHER): Payer: Medicare HMO | Admitting: Cardiovascular Disease

## 2017-02-02 ENCOUNTER — Encounter: Payer: Self-pay | Admitting: Cardiovascular Disease

## 2017-02-02 VITALS — BP 138/86 | HR 80 | Ht 66.0 in | Wt 219.4 lb

## 2017-02-02 DIAGNOSIS — E785 Hyperlipidemia, unspecified: Secondary | ICD-10-CM | POA: Diagnosis not present

## 2017-02-02 DIAGNOSIS — I481 Persistent atrial fibrillation: Secondary | ICD-10-CM

## 2017-02-02 DIAGNOSIS — I4819 Other persistent atrial fibrillation: Secondary | ICD-10-CM

## 2017-02-02 DIAGNOSIS — Z9889 Other specified postprocedural states: Secondary | ICD-10-CM | POA: Diagnosis not present

## 2017-02-02 DIAGNOSIS — I739 Peripheral vascular disease, unspecified: Secondary | ICD-10-CM | POA: Diagnosis not present

## 2017-02-02 DIAGNOSIS — I10 Essential (primary) hypertension: Secondary | ICD-10-CM

## 2017-02-02 NOTE — Progress Notes (Signed)
Cardiology Office Note   Date:  02/02/2017   ID:  Kayla Hopkins, DOB December 27, 1960, MRN [id number]  PCP:  Hoyt Koch, MD  Cardiologist:   Kathlyn Sacramento, MD   Chief Complaint  Patient presents with  . Follow-up    PAF  . Shortness of Breath  . Dizziness      History of Present Illness: Kayla Hopkins is a 56 y.o. female who presents for a follow-up visit regarding persistent atrial fibrillation, mitral regurgitation, tricuspid regurgitation and peripheral arterial disease. She is status post mitral valve repair/tricuspid valve repair and maze procedure in February 2018. She is status post bilateral iliac stenting with covered stent on the right side for restenosis.  She was hospitalized last month for atypical chest pain. Initially she was thought to have pulmonary embolism but after reviewing CT images it was felt that it was artifact. She has been doing well since then with no anginal symptoms. She continues to have pain at the surgical incision site but that has been improving slowly. She had some episodes of dizziness. She has not started cardiac rehabilitation. She currently lives in [place]. No claudication. Echocardiogram after surgery showed normal LV systolic function, mild mitral regurgitation with moderately elevated gradient, mild tricuspid regurgitation and mild pulmonary hypertension.  Past Medical History:  Diagnosis Date  . Anxiety   . Arthritis    "knees, back" (02/11/2016)  . Atrial fibrillation ([place])    a. CHA2DS2VASc = 4;  b. 12/10/2016 s/p Maze @ time of MVR/TVR.  [name] Bruises easily    d/t being on Eliquis  . Chronic back pain    stenosis  . Chronic diastolic CHF (congestive heart failure) ([place])    a. 02/2016 Echo; EF 55-60%, no rwma, mild to mod MR, mod dil LA, PASP 17mmHg;  b. 10/2016 TEE: nl EF, mod MR, mod-sev TR.  . Claudication ([place])       . Daily headache   . Depression   . GERD (gastroesophageal reflux disease)    takes Nexium  nightly  . History of migraine    last one a couple of wks ago  . Hyperlipidemia    takes Atorvastatin daily  . Hypertension    takes Diltiazem and Diltiazem daily  . Migraine    "probably have 3/year; had one today; tried several medications-no relief" (02/11/2016)  . Moderate mitral regurgitation    a. 10/2016 TEE: nl EF, mod MR, mod to severe LAE, mod to sev TR; b. 12/10/2016 MVR (Sorin Carbomedics Annuloflex posterior annuloplasty band (24mm, ref# [id number], ser # [id number]).  . Moderate to Severe Tricuspid regurgitation    a. 10/2014 TEE: mod-sev TR; b. 12/10/2016 s/p TVR w/ Edwards mc3 ring annuloplasty (10mm, model #4900, ser # [id number]).  . Non-obstructive CAD (coronary artery disease)    a. 11/2016 Cath: mRCA 10, otw nl cors, EF 55-65%, 3+MR.  [name] Obstructive sleep apnea    wears CPAP  . Peripheral vascular disease ([place]) 06/11/2009   a. 06/2009 Bilateral common iliac kissing stents (8X24 mm);  b. 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 She underwent successful balloon angioplasty;  c. Restenosis in RCIA in 12/13 s/p  Covered stent; d. 05/2013: 70% in distal left common iliac artery s/p self expanding stent placement; e. 08/2016 ABI: R 1.1, L 0.99, nl Abd Ao.  [name] Persistent atrial fibrillation ([facility])    a. CHA2DS2VASc = 4-->prev eliquis, now coumadin; b. s/p prior DCCV  x 2;  c. failed medical therapy-->poor candidate for PVI 2/2 mod MR and LAE;  d. Complete bilateral atrial lesion set using cryothermy and bipolar radiofrequency ablation via median sternotomy with clipping of LA appendage.  [name] PTSD (post-traumatic stress disorder)   . Urinary frequency   . Urinary urgency   . Vertigo     Past Surgical History:  Procedure Laterality Date  . ABDOMINAL ANGIOGRAM N/A 05/11/2012   Procedure: ABDOMINAL ANGIOGRAM;  Surgeon: [name], MD;  Location: [facility] CATH LAB;  Service: Cardiovascular;  Laterality: N/A;  . ABDOMINAL AORTAGRAM  N/A 10/19/2012   Procedure: ABDOMINAL Maxcine Ham;  Surgeon: [name], MD;  Location: [facility] CATH LAB;  Service: Cardiovascular;  Laterality: N/A;  . ABDOMINAL AORTAGRAM N/A 05/10/2013   Procedure: ABDOMINAL Maxcine Ham;  Surgeon: [name], MD;  Location: [facility] CATH LAB;  Service: Cardiovascular;  Laterality: N/A;  . BACK SURGERY    . CARDIAC CATHETERIZATION  2009   no significant obstruction  . CARDIAC CATHETERIZATION N/A 12/02/2016   Procedure: Right/Left Heart Cath and Coronary Angiography;  Surgeon: [name], MD;  Location: [facility] CV LAB;  Service: Cardiovascular;  Laterality: N/A;  . CARDIOVERSION N/A 03/19/2016   Procedure: CARDIOVERSION;  Surgeon: Pixie Casino, MD;  Location: [facility] ENDOSCOPY;  Service: Cardiovascular;  Laterality: N/A;  . CARDIOVERSION N/A 07/10/2016   Procedure: CARDIOVERSION;  Surgeon: Pixie Casino, MD;  Location: [facility] ENDOSCOPY;  Service: Cardiovascular;  Laterality: N/A;  . ILIAC ARTERY STENT Bilateral 06/2009   common iliac kissing stents (8X24 mm)  . KNEE ARTHROSCOPY Bilateral   . LUMBAR DISC SURGERY     spinal stenosis  . MAZE N/A 12/10/2016   Procedure: MAZE;  Surgeon: [name], MD;  Location: [facility];  Service: Open Heart Surgery;  Laterality: N/A;  . MITRAL VALVE REPAIR N/A 12/10/2016   Procedure: MITRAL VALVE REPAIR (MVR);  Surgeon: [name], MD;  Location: [facility] [place];  Service: Open Heart Surgery;  Laterality: N/A;  . MULTIPLE EXTRACTIONS WITH ALVEOLOPLASTY N/A 11/12/2016   Procedure: EXTRACTION OF TOOTH #'S 18, 20-29, AND 31 WITH  ALVEOLOPLASTY AND BILATERAL MANDIBULAR TORI REDUCTIONS.;  Surgeon: Lenn Cal, DDS;  Location: [place];  Service: Oral Surgery;  Laterality: N/A;  . ROBOTIC ASSISTED TOTAL HYSTERECTOMY WITH SALPINGECTOMY Bilateral 09/23/2015   Procedure: ROBOTIC ASSISTED TOTAL HYSTERECTOMY BILATERALSALPINGECTOMY AND OOPHORECTOMY;  Surgeon: [name], MD;  Location: [facility];  Service: Gynecology;  Laterality: Bilateral;  .  TEE WITHOUT CARDIOVERSION N/A 10/13/2016   Procedure: TRANSESOPHAGEAL ECHOCARDIOGRAM (TEE);  Surgeon: Lelon Perla, MD;  Location: [facility] ENDOSCOPY;  Service: Cardiovascular;  Laterality: N/A;  . TEE WITHOUT CARDIOVERSION N/A 12/10/2016   Procedure: TRANSESOPHAGEAL ECHOCARDIOGRAM (TEE);  Surgeon: [name], MD;  Location: [facility];  Service: Open Heart Surgery;  Laterality: N/A;  . TRICUSPID VALVE REPLACEMENT N/A 12/10/2016   Procedure: TRICUSPID VALVE REPAIR;  Surgeon: [name], MD;  Location: [facility];  Service: Open Heart Surgery;  Laterality: N/A;  . TUBAL LIGATION  1991  . VULVECTOMY N/A 09/23/2015   Procedure: WIDE EXCISION VULVECTOMY  WLE of vulvar VIN 2;  Surgeon: [name], MD;  Location: [facility];  Service: Gynecology;  Laterality: N/A;     Current Outpatient Prescriptions  Medication Sig Dispense Refill  . acetaminophen (TYLENOL) 500 MG tablet Take 1,000 mg by mouth every 6 (six) hours as needed for mild pain.    [name] amiodarone (PACERONE) 200 MG tablet Take 1 tablet (200  mg total) by mouth daily. 30 tablet 0  . atorvastatin (LIPITOR) 40 MG tablet Take 1 tablet (40 mg total) by mouth daily. (Patient taking differently: Take 40 mg by mouth daily at 6 PM. ) 90 tablet 3  . esomeprazole (NEXIUM) 20 MG capsule Take 20 mg by mouth at bedtime.     . furosemide (LASIX) 40 MG tablet Take 1 tablet (40 mg total) by mouth daily. 90 tablet 3  . iron polysaccharides (NIFEREX) 150 MG capsule Take 1 capsule (150 mg total) by mouth daily. 30 capsule 1  . lisinopril (PRINIVIL,ZESTRIL) 2.5 MG tablet Take 1 tablet (2.5 mg total) by mouth daily. 90 tablet 1  . metoprolol succinate (TOPROL-XL) 25 MG 24 hr tablet Take 1 tablet (25 mg total) by mouth daily. 30 tablet 1  . OVER THE COUNTER MEDICATION Inhale 1 application into the lungs at bedtime. CPAP    . potassium chloride SA (K-DUR,KLOR-CON) 20 MEQ tablet Take 20 mEq by mouth daily.    [name] warfarin (COUMADIN) 4 MG tablet Take 1 tablet daily or as  directed by coumadin clinic 90 tablet 0   No current facility-administered medications for this visit.     Allergies:   Pradaxa [dabigatran etexilate mesylate] and Zocor [simvastatin]    Social History:  The patient  reports that she quit smoking about 14 months ago. Her smoking use included Cigarettes. She has a 3.70 pack-year smoking history. She has never used smokeless tobacco. She reports that she drinks alcohol. She reports that she does not use drugs.   Family History:  The patient's family history includes Colon polyps in her sister; Heart attack in her father; Heart disease in her father; Hypertension in her brother and mother; Lung cancer in her maternal grandfather and mother; Ovarian cancer in her mother; Throat cancer in her paternal grandfather.    ROS:  Please see the history of present illness.   Otherwise, review of systems are positive for none.   All other systems are reviewed and negative.    PHYSICAL EXAM: VS:  BP 138/86   Pulse 80   Ht 5\' 6"  (1.676 m)   Wt 219 lb 6.4 oz (99.5 kg)   LMP 11/09/2008   BMI 35.41 kg/m  , BMI Body mass index is 35.41 kg/m. GEN: Well nourished, well developed, in no acute distress  HEENT: normal  Neck: no JVD, carotid bruits, or masses Cardiac: RRR; no  rubs, or gallops,no edema . 2/6 systolic ejection murmur at the base. Respiratory:  clear to auscultation bilaterally, normal work of breathing GI: soft, nontender, nondistended, + BS MS: no deformity or atrophy  Skin: warm and dry, no rash Neuro:  Strength and sensation are intact Psych: euthymic mood, full affect   EKG:  EKG is not ordered today.    Recent Labs: 02/10/2016: TSH 1.124 01/05/2017: B Natriuretic Peptide 193.5 01/06/2017: ALT 19; BUN 18; Creatinine, Ser 0.87; Magnesium 1.7; Potassium 3.5; Sodium 135 01/07/2017: Hemoglobin 10.1; Platelets 414    Lipid Panel    Component Value Date/Time   CHOL 188 06/08/2012 1526   TRIG 474.0 (H) 06/08/2012 1526   HDL 46.00  06/08/2012 1526   CHOLHDL 4 06/08/2012 1526   VLDL 94.8 (H) 06/08/2012 1526   LDLCALC (H) 08/11/2008 0340    147        Total Cholesterol/HDL:CHD Risk Coronary Heart Disease Risk Table                     Men  Women  1/2 Average Risk   3.4   3.3   LDLDIRECT 77.8 06/08/2012 1526      Wt Readings from Last 3 Encounters:  02/02/17 219 lb 6.4 oz (99.5 kg)  01/11/17 216 lb (98 kg)  01/07/17 180 lb 1.6 oz (81.7 kg)       ASSESSMENT AND PLAN:  1.Status post mitral and tricuspid valve repair for significant regurgitation: She is progressing well overall with gradual improvement in symptoms. I discussed with her the importance of attending cardiac rehabilitation and recent referral again to the cardiac rehabilitation in [place] where she lives.  2. Persistent atrial fibrillation:  She is maintaining in sinus rhythm after surgical maze procedure. I suspect that amiodarone can likely be discontinued in the near future. She is currently on long-term anticoagulation with warfarin.   3 Peripheral arterial disease: No claudication. Most recent noninvasive vascular evaluation in October 2016 showed patent iliac stents.  5. Essential hypertension: Blood pressure is well controlled.   6. Hyperlipidemia: Continue treatment with atorvastatin with a target LDL of less than 70.    Disposition:   FU with me in 6 months  Signed,  Kathlyn Sacramento, MD  02/02/2017 10:46 AM    [place]

## 2017-02-02 NOTE — Patient Instructions (Signed)
Medication Instructions:  Your physician recommends that you continue on your current medications as directed. Please refer to the Current Medication list given to you today.  Follow-Up: Your physician wants you to follow-up in: 6 MONTHS with Dr. Fletcher Anon. You will receive a reminder letter in the mail two months in advance. If you don't receive a letter, please call our office to schedule the follow-up appointment.   Any Other Special Instructions Will Be Listed Below (If Applicable).  We have sent a referral to [facility] for Cardiac Rehab.  They should reach out to you soon to get this set up.   If you need a refill on your cardiac medications before your next appointment, please call your pharmacy.

## 2017-02-03 ENCOUNTER — Other Ambulatory Visit: Payer: Self-pay

## 2017-02-03 ENCOUNTER — Telehealth: Payer: Self-pay | Admitting: *Deleted

## 2017-02-03 NOTE — Telephone Encounter (Signed)
Received blank referral form from [facility] out and awaiting MD review/signature.

## 2017-02-03 NOTE — Patient Outreach (Signed)
[facility]) Care Management  02/03/2017  Kayla Hopkins 30-Jul-1961 [id number]  Third telephone call to patient for transition of care follow up. Unable to reach.  HIPAA compliant voice message left with call back phone number.   PLAN:  RNCM will send patient outreach letter to attempt contact.   Quinn Plowman RN,BSN,CCM [facility] Telephonic  [phone]

## 2017-02-03 NOTE — Telephone Encounter (Signed)
Called and spoke to [place] at [facility] fax paperwork to be completed for referral.  Faxed to [phone].  Will complete and return fax.

## 2017-02-04 ENCOUNTER — Ambulatory Visit (INDEPENDENT_AMBULATORY_CARE_PROVIDER_SITE_OTHER): Payer: Medicare HMO | Admitting: Pharmacist

## 2017-02-04 DIAGNOSIS — Z9889 Other specified postprocedural states: Secondary | ICD-10-CM | POA: Diagnosis not present

## 2017-02-04 LAB — POCT INR: INR: 3.1

## 2017-02-10 ENCOUNTER — Telehealth: Payer: Self-pay | Admitting: *Deleted

## 2017-02-10 DIAGNOSIS — N891 Moderate vaginal dysplasia: Secondary | ICD-10-CM

## 2017-02-10 NOTE — Telephone Encounter (Signed)
Call to patient. Voice mail confirms "Kayla Hopkins and Kayla Hopkins." Left message for Aaron to call back at her convenience, nothing wrong. Follow-up call. Ask for Gay Filler.  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  Dr Sabra Heck aware of recent cardiac surgery and wanted to check on patient and schedule as patient is able physically. Left message to call back.

## 2017-02-11 ENCOUNTER — Ambulatory Visit (INDEPENDENT_AMBULATORY_CARE_PROVIDER_SITE_OTHER): Payer: Medicare HMO | Admitting: *Deleted

## 2017-02-11 DIAGNOSIS — Z9889 Other specified postprocedural states: Secondary | ICD-10-CM | POA: Diagnosis not present

## 2017-02-11 LAB — POCT INR: INR: 3

## 2017-02-15 ENCOUNTER — Telehealth: Payer: Self-pay | Admitting: *Deleted

## 2017-02-15 NOTE — Telephone Encounter (Signed)
Patient in 04 recall, left breast DX and U/S- this is a follow up from 03/2015. Patient recently had heart surgery,  would you like to extend recall  Please advise

## 2017-02-16 NOTE — Telephone Encounter (Signed)
Cardiac Rehabilitation referral form completed by Dr Fletcher Anon and faxed to [phone].

## 2017-02-19 ENCOUNTER — Other Ambulatory Visit: Payer: Self-pay

## 2017-02-19 NOTE — Patient Outreach (Signed)
[facility]) Care Management  02/19/2017  RESHMA HOEY 16-Nov-1960 [id number]  Case Closure: No response from patient after 3 telephone calls and outreach letter attempt.  PLAN; RNCM will refer patient to care management assistant to close due to inability to establish contact with patient.  RNCM will notify patients primary MD of case closure.   Quinn Plowman RN,BSN,CCM [facility] Telephonic  [phone]

## 2017-02-21 NOTE — Telephone Encounter (Signed)
OK to change for two months from now.  We can see how she is doing then and consider calling her then.

## 2017-02-22 ENCOUNTER — Encounter: Payer: Self-pay | Admitting: Thoracic Surgery (Cardiothoracic Vascular Surgery)

## 2017-02-22 ENCOUNTER — Ambulatory Visit (INDEPENDENT_AMBULATORY_CARE_PROVIDER_SITE_OTHER): Payer: Self-pay | Admitting: Thoracic Surgery (Cardiothoracic Vascular Surgery)

## 2017-02-22 VITALS — BP 140/81 | HR 80 | Resp 18 | Ht 66.0 in | Wt 219.0 lb

## 2017-02-22 DIAGNOSIS — Z8679 Personal history of other diseases of the circulatory system: Secondary | ICD-10-CM

## 2017-02-22 DIAGNOSIS — Z9889 Other specified postprocedural states: Secondary | ICD-10-CM

## 2017-02-22 NOTE — Telephone Encounter (Signed)
Recall updated. Encounter closed.

## 2017-02-22 NOTE — Telephone Encounter (Signed)
She doesn't need to stop the coumadin.  Thanks for calling her.  Recall should be changed to 03/09/17.  Thanks.

## 2017-02-22 NOTE — Telephone Encounter (Signed)
Call to patient regarding appointment vulvar colpo due in April.  Advised we realize she is still recovering from heart surgery. She states she is doing well, still sore and has some SOB but is generally improving.  Went to MD for treatment of AFib and was scheduled for ablation. Ended up having surgery for heart valve replacement. Afib resolved.  Agreeable to schedule colpo. Has cardio appointment today and instructed to advised cardiologist of appointment. Appointment scheduled for 03-01-17.  Patient is on Coumadin.   Advised will review with Dr Sabra Heck and call her back if any additional instructions.

## 2017-02-22 NOTE — Telephone Encounter (Signed)
Recall extended -eh 

## 2017-02-22 NOTE — Progress Notes (Signed)
[address]       [place] [address]             [phone]     CARDIOTHORACIC SURGERY OFFICE NOTE  Referring Provider is Thompson Grayer, MD  Primary Cardiologist is [name], MD PCP is Hoyt Koch, MD  HPI:  Patient is a 56 year old morbidly obese female with history of long-standing persistent persistent atrial fibrillation, mitral regurgitation, tricuspid regurgitation, chronic diastolic congestive heart failure, hypertension, obstructive sleep apnea, peripheral arterial disease, post traumatic stress disorder, and remote history of long-standing tobacco abuse who returns to the office today for follow-up status post mitral valve repair, tricuspid valve repair, and Maze procedure on 12/10/2016.   her early postoperative recovery was uneventful although she was readmitted briefly after she was evaluated in the emergency department for atypical chest pain and shortness of breath that clinically seemed to be related to her sternotomy. CT angiogram performed at that time recent question of a very small pulmonary embolism in the left lung, but at the time the patient was supratherapeutic on warfarin and subsequent review of the CT revealed findings suggestive of artifact. Since then the patient has done well. She was seen in follow-up by Dr. Fletcher Anon on 02/02/2017 and follow-up echocardiogram has been scheduled for later this week. She returns to our office today reports that she feels fairly well. The soreness in her chest continues to gradually improve. She has not been taking any sort of pain relievers other than Tylenol for the last several weeks or she walks every day with her husband, typically 1-1/2 miles at a time. She has been able to gradually increase how fast she walks. She still gets short of breath with activity but this is improving. She hopes to enroll in the cardiac rehabilitation program but this is still yet to be arranged. She has not had any  problems with Coumadin therapy. She denies any palpitations or other symptoms to suggest a recurrence of atrial fibrillation. Overall she feels as though she is making good progress.   Current Outpatient Prescriptions  Medication Sig Dispense Refill  . acetaminophen (TYLENOL) 500 MG tablet Take 1,000 mg by mouth every 6 (six) hours as needed for mild pain.    [name] amiodarone (PACERONE) 200 MG tablet Take 1 tablet (200 mg total) by mouth daily. 30 tablet 0  . atorvastatin (LIPITOR) 40 MG tablet Take 1 tablet (40 mg total) by mouth daily. (Patient taking differently: Take 40 mg by mouth daily at 6 PM. ) 90 tablet 3  . esomeprazole (NEXIUM) 20 MG capsule Take 20 mg by mouth at bedtime.     . furosemide (LASIX) 40 MG tablet Take 1 tablet (40 mg total) by mouth daily. 90 tablet 3  . iron polysaccharides (NIFEREX) 150 MG capsule Take 1 capsule (150 mg total) by mouth daily. 30 capsule 1  . lisinopril (PRINIVIL,ZESTRIL) 2.5 MG tablet Take 1 tablet (2.5 mg total) by mouth daily. 90 tablet 1  . metoprolol succinate (TOPROL-XL) 25 MG 24 hr tablet Take 1 tablet (25 mg total) by mouth daily. 30 tablet 1  . OVER THE COUNTER MEDICATION Inhale 1 application into the lungs at bedtime. CPAP    . potassium chloride SA (K-DUR,KLOR-CON) 20 MEQ tablet Take 20 mEq by mouth daily.    [name] warfarin (COUMADIN) 4 MG tablet Take 1 tablet daily or as directed by coumadin clinic 90 tablet 0   No current facility-administered medications for this visit.  Physical Exam:   BP 140/81 (BP Location: Right Arm, Patient Position: Sitting, Cuff Size: Large)   Pulse 80   Resp 18   Ht 5\' 6"  (1.676 m)   Wt 219 lb (99.3 kg)   LMP 11/09/2008   SpO2 98% Comment: ON RA  BMI 35.35 kg/m   General:  Obese but well appearing  Chest:   Clear to auscultation  CV:   Regular rate and rhythm without murmur  Incisions:  Healing nicely, sternum is stable  Abdomen:  Soft and nontender  Extremities:  Warm and well-perfused, no lower  extremity edema  Diagnostic Tests:  2 channel telemetry rhythm strip demonstrates normal sinus rhythm   Impression:  Patient appears to be doing very well approximately 2-1/2 months status post mitral valve repair, tricuspid valve repair, and Maze procedure. The patient's recovering without complication and appears to be maintaining sinus rhythm.   Plan:  I have encouraged the patient to continue to gradually increase her physical activity as tolerated without any particular limitations at this time. I think she would benefit from participation in the outpatient cardiac rehabilitation program. I've instructed the patient to stop taking amiodarone within the next month or when her current prescription runs out, whichever comes first. I have also instructed the patient to contact Dr. Tyrell Antonio office and the Coumadin clinic to notify her when she stops taking Amiodarone so that her Coumadin dose can be adjusted.  Otherwise not recommended any changes to her current medications. She has asked about Lasix, and I have suggested that it might be reasonable to cut back or stop Lasix and potassium temporarily. However, if she does she should continue to monitor her weight carefully and consider resuming Lasix if she develops any increased shortness of breath, lower extremity edema, or gradually increase in her weight.  The patient has been reminded regarding the importance of dental hygiene and the lifelong need for antibiotic prophylaxis for all dental cleanings and other related invasive procedures.  The patient will continue to follow-up closely with Dr. Fletcher Anon and she will be referred to the atrial fibrillation clinic for long-term surveillance following Maze procedure. She will return to our office for routine follow-up next February, approximately 1 year following her surgery. She will call and return to our office sooner should specific problems or questions arise.  All of her questions have been  addressed.    Valentina Gu. Roxy Manns, MD 02/22/2017 4:36 PM

## 2017-02-22 NOTE — Patient Instructions (Signed)
Stop taking Amiodarone when your current prescription runs out or in 1 month, whichever comes first.  Notify the Coumadin clinic and Dr. Tyrell Antonio office when you stop taking Amiodarone, as this may affect your Coumadin dosing.  Continue all other previous medications without any changes at this time  You are encouraged to enroll and participate in the outpatient cardiac rehab program beginning as soon as practical.  You may resume unrestricted physical activity without any particular limitations at this time.  Endocarditis is a potentially serious infection of heart valves or inside lining of the heart.  It occurs more commonly in patients with diseased heart valves (such as patient's with aortic or mitral valve disease) and in patients who have undergone heart valve repair or replacement.  Certain surgical and dental procedures may put you at risk, such as dental cleaning, other dental procedures, or any surgery involving the respiratory, urinary, gastrointestinal tract, gallbladder or prostate gland.   To minimize your chances for develooping endocarditis, maintain good oral health and seek prompt medical attention for any infections involving the mouth, teeth, gums, skin or urinary tract.    Always notify your doctor or dentist about your underlying heart valve condition before having any invasive procedures. You will need to take antibiotics before certain procedures, including all routine dental cleanings or other dental procedures.  Your cardiologist or dentist should prescribe these antibiotics for you to be taken ahead of time.

## 2017-02-24 ENCOUNTER — Other Ambulatory Visit: Payer: Self-pay | Admitting: Obstetrics & Gynecology

## 2017-02-24 NOTE — Telephone Encounter (Signed)
Patient has a vulvar colposcopy scheduled for Monday 03/01/17.  States she had heart surgery 12/10/2016 and her cardiologist would feel more comfortable if she had antibiotic for a few days before procedure.  Wants to know if we can call something into her pharmacy.

## 2017-02-24 NOTE — Telephone Encounter (Signed)
Dr. Sabra Heck -please review patient message below and advise on antibiotic?

## 2017-02-25 ENCOUNTER — Ambulatory Visit (INDEPENDENT_AMBULATORY_CARE_PROVIDER_SITE_OTHER): Payer: Medicare HMO | Admitting: *Deleted

## 2017-02-25 DIAGNOSIS — Z9889 Other specified postprocedural states: Secondary | ICD-10-CM | POA: Diagnosis not present

## 2017-02-25 LAB — POCT INR: INR: 2.1

## 2017-02-25 NOTE — Telephone Encounter (Signed)
Ok to give amoxicillin 2gm po daily the day of the procedure.  Ok to sent to pharmacy.

## 2017-02-25 NOTE — Telephone Encounter (Signed)
Left message to call Kadee Philyaw at [phone].  

## 2017-02-26 DIAGNOSIS — G4733 Obstructive sleep apnea (adult) (pediatric): Secondary | ICD-10-CM | POA: Diagnosis not present

## 2017-02-26 MED ORDER — AMOXICILLIN 500 MG PO CAPS: 2000.0000 mg | ORAL_CAPSULE | Freq: Once | ORAL | 0 refills | Status: AC

## 2017-02-26 NOTE — Telephone Encounter (Signed)
Spoke with patient, advised as seen below per Dr. Sabra Heck. RX for amoxicillin to verified pharmacy on file. Patient verbalizes understanding and is agreeable.  Routing to provider for final review. Patient is agreeable to disposition. Will close encounter.

## 2017-03-01 ENCOUNTER — Ambulatory Visit (INDEPENDENT_AMBULATORY_CARE_PROVIDER_SITE_OTHER): Payer: Medicare HMO | Admitting: Obstetrics & Gynecology

## 2017-03-01 VITALS — BP 118/62 | HR 78 | Resp 18 | Ht 66.0 in | Wt 221.0 lb

## 2017-03-01 DIAGNOSIS — N891 Moderate vaginal dysplasia: Secondary | ICD-10-CM | POA: Diagnosis not present

## 2017-03-01 NOTE — Progress Notes (Signed)
GYNECOLOGY  VISIT   HPI: 56 y.o. 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  Pt also had condyloma removed a week later as I thought all of the findings were condyloma.    Biopsy showed: Vulva, biopsy, right inferior vulvar, inferior on right to perineal body - HIGH GRADE VULVAR SQUAMOUS INTRAEPITHELIAL LESION (VIN-II, MODERATE DYSPLASIA) SEE COMMENT.  This was excised when her hysterectomy was performed.  Pathology showed:  1. Vulva, excision - HIGH GRADE VULVAR SQUAMOUS INTRAEPITHELIAL LESION (VIN-II, MODERATE DYSPLASIA). - DYSPLASIA IS PRESENT AT THE 6 O'CLOCK, 9 O'CLOCK, AND 12 O'CLOCK EXCISIONAL MARGINS.  She has been having every six month vulvar colposcopies since that time.  Colposcopies were done 01/31/16, 08/14/16, and then today.  This one was delayed due to recent cardiac surgery--mitral and tricuspid valve repairs with MAZE procedure done 12/10/16.    Pt denies vulvar itching or burning.  She denies vaginal bleeding or discharge.     GYNECOLOGIC HISTORY: Patient's last menstrual period was 11/09/2008. Contraception: hysterectomy  Menopausal hormone therapy: none  Patient Active Problem List   Diagnosis Date Noted  . GERD (gastroesophageal reflux disease) 01/05/2017  . IDA (iron deficiency anemia) 01/05/2017  . 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 S/P tricuspid valve repair 12/10/2016  . S/P Maze operation for atrial fibrillation 12/10/2016  . Mitral regurgitation   . Tricuspid regurgitation   . OSA on CPAP   . Chronic diastolic CHF (congestive heart failure) ([place])   . Persistent atrial fibrillation ([place])   . Chest pressure 02/10/2016  . Tobacco use 02/10/2016  . BPPV (benign paroxysmal positional vertigo) 11/19/2015  . History of smoking 10/28/2015  . 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 Intractable migraine with aura without status  migrainosus 03/18/2015  . Medication overuse headache 03/18/2015  . Joint pain 01/24/2015  . SOB (shortness of breath) on exertion 01/24/2015  . Non-cardiac chest pain 03/09/2014  . Postlaminectomy syndrome, lumbar region 01/24/2013  . Peripheral vascular disease ([place])   . HTN (hypertension) 06/26/2011  . Hyperlipidemia 06/26/2011    Past Medical History:  Diagnosis Date  . Anxiety   . Arthritis    "knees, back" (02/11/2016)  . Atrial fibrillation ([place])    a. CHA2DS2VASc = 4;  b. 12/10/2016 s/p Maze @ time of MVR/TVR.  [name] Bruises easily    d/t being on Eliquis  . Chronic back pain    stenosis  . Chronic diastolic CHF (congestive heart failure) ([place])    a. 02/2016 Echo; EF 55-60%, no rwma, mild to mod MR, mod dil LA, PASP 49mmHg;  b. 10/2016 TEE: nl EF, mod MR, mod-sev TR.  . Claudication ([place])       . Daily headache   . Depression   . GERD (gastroesophageal reflux disease)    takes Nexium nightly  . History of migraine    last one a couple of wks ago  . Hyperlipidemia    takes Atorvastatin daily  . Hypertension    takes Diltiazem and Diltiazem daily  . Migraine    "probably have 3/year; had one today; tried several medications-no relief" (02/11/2016)  . Moderate mitral regurgitation    a. 10/2016 TEE: nl EF, mod MR, mod to severe LAE, mod to sev TR; b. 12/10/2016 MVR (Sorin Carbomedics Annuloflex posterior annuloplasty band (58mm, ref# [id number], ser # [id number]).  . Moderate to Severe Tricuspid regurgitation    a.  10/2014 TEE: mod-sev TR; b. 12/10/2016 s/p TVR w/ Edwards mc3 ring annuloplasty (82mm, model #4900, ser # [id number]).  . Non-obstructive CAD (coronary artery disease)    a. 11/2016 Cath: mRCA 10, otw nl cors, EF 55-65%, 3+MR.  [name] Obstructive sleep apnea    wears CPAP  . Peripheral vascular disease ([place]) 06/11/2009   a. 06/2009 Bilateral common iliac kissing stents (8X24 mm);  b. Repeat angiography in July of 2013 showed severe in-stent restenosis in the right common iliac artery  stent. She underwent successful balloon angioplasty;  c. Restenosis in RCIA in 12/13 s/p  Covered stent; d. 05/2013: 70% in distal left common iliac artery s/p self expanding stent placement; e. 08/2016 ABI: R 1.1, L 0.99, nl Abd Ao.  [name] Persistent atrial fibrillation ([facility])    a. CHA2DS2VASc = 4-->prev eliquis, now coumadin; b. s/p prior DCCV x 2;  c. failed medical therapy-->poor candidate for PVI 2/2 mod MR and LAE;  d. Complete bilateral atrial lesion set using cryothermy and bipolar radiofrequency ablation via median sternotomy with clipping of LA appendage.  [name] PTSD (post-traumatic stress disorder)   . Urinary frequency   . Urinary urgency   . Vertigo     Past Surgical History:  Procedure Laterality Date  . ABDOMINAL ANGIOGRAM N/A 05/11/2012   Procedure: ABDOMINAL ANGIOGRAM;  Surgeon: [name], MD;  Location: [facility];  Service: Cardiovascular;  Laterality: N/A;  . ABDOMINAL AORTAGRAM N/A 10/19/2012   Procedure: ABDOMINAL Maxcine Ham;  Surgeon: [name], MD;  Location: [facility];  Service: Cardiovascular;  Laterality: N/A;  . ABDOMINAL AORTAGRAM N/A 05/10/2013   Procedure: ABDOMINAL Maxcine Ham;  Surgeon: [name], MD;  Location: [facility];  Service: Cardiovascular;  Laterality: N/A;  . BACK SURGERY    . CARDIAC CATHETERIZATION  2009   no significant obstruction  . CARDIAC CATHETERIZATION N/A 12/02/2016   Procedure: Right/Left Heart Cath and Coronary Angiography;  Surgeon: [name], MD;  Location: [facility] CV LAB;  Service: Cardiovascular;  Laterality: N/A;  . CARDIOVERSION N/A 03/19/2016   Procedure: CARDIOVERSION;  Surgeon: Pixie Casino, MD;  Location: [facility] ENDOSCOPY;  Service: Cardiovascular;  Laterality: N/A;  . CARDIOVERSION N/A 07/10/2016   Procedure: CARDIOVERSION;  Surgeon: Pixie Casino, MD;  Location: [facility] ENDOSCOPY;  Service: Cardiovascular;  Laterality: N/A;  . ILIAC ARTERY STENT Bilateral 06/2009   common iliac kissing stents (8X24 mm)  . KNEE  ARTHROSCOPY Bilateral   . LUMBAR DISC SURGERY     spinal stenosis  . MAZE N/A 12/10/2016   Procedure: MAZE;  Surgeon: [name], MD;  Location: [facility];  Service: Open Heart Surgery;  Laterality: N/A;  . MITRAL VALVE REPAIR N/A 12/10/2016   Procedure: MITRAL VALVE REPAIR (MVR);  Surgeon: [name], MD;  Location: [facility];  Service: Open Heart Surgery;  Laterality: N/A;  . MULTIPLE EXTRACTIONS WITH ALVEOLOPLASTY N/A 11/12/2016   Procedure: EXTRACTION OF TOOTH #'S 18, 20-29, AND 31 WITH  ALVEOLOPLASTY AND BILATERAL MANDIBULAR TORI REDUCTIONS.;  Surgeon: Lenn Cal, DDS;  Location: [facility];  Service: Oral Surgery;  Laterality: N/A;  . ROBOTIC ASSISTED TOTAL HYSTERECTOMY WITH SALPINGECTOMY Bilateral 09/23/2015   Procedure: ROBOTIC ASSISTED TOTAL HYSTERECTOMY BILATERALSALPINGECTOMY AND OOPHORECTOMY;  Surgeon: [name], MD;  Location: [facility];  Service: Gynecology;  Laterality: Bilateral;  . TEE WITHOUT CARDIOVERSION N/A 10/13/2016   Procedure: TRANSESOPHAGEAL ECHOCARDIOGRAM (TEE);  Surgeon: Lelon Perla, MD;  Location: [facility];  Service: Cardiovascular;  Laterality: N/A;  .  TEE WITHOUT CARDIOVERSION N/A 12/10/2016   Procedure: TRANSESOPHAGEAL ECHOCARDIOGRAM (TEE);  Surgeon: [name], MD;  Location: [facility];  Service: Open Heart Surgery;  Laterality: N/A;  . TRICUSPID VALVE REPLACEMENT N/A 12/10/2016   Procedure: TRICUSPID VALVE REPAIR;  Surgeon: [name], MD;  Location: [facility];  Service: Open Heart Surgery;  Laterality: N/A;  . TUBAL LIGATION  1991  . VULVECTOMY N/A 09/23/2015   Procedure: WIDE EXCISION VULVECTOMY  WLE of vulvar VIN 2;  Surgeon: [name], MD;  Location: [facility];  Service: Gynecology;  Laterality: N/A;    MEDS:  Reviewed in EPIC and UTD  ALLERGIES: Pradaxa [dabigatran etexilate mesylate] and Zocor [simvastatin]  Family History  Problem Relation Age of Onset  . Hypertension Mother   . Ovarian cancer Mother   . Lung cancer Mother   . Heart disease  Father   . Heart attack Father   . Hypertension Brother   . Colon polyps Sister   . Lung cancer Maternal Grandfather   . Throat cancer Paternal Grandfather     SH:  Single, non smoker (prior hx but stopped)  Review of Systems  Constitutional: Negative.   Gastrointestinal: Negative.   Genitourinary: Negative.     PHYSICAL EXAMINATION:    BP 118/62 (BP Location: Right Arm, Patient Position: Sitting, Cuff Size: Large)   Pulse 78   Resp 18   Ht 5\' 6"  (1.676 m)   Wt 221 lb (100.2 kg)   LMP 11/09/2008   BMI 35.67 kg/m     Physical Exam  Constitutional: She appears well-developed and well-nourished.  Genitourinary: Vagina normal.    There is no rash, tenderness, lesion or injury on the right labia. There is no rash, tenderness, lesion or injury on the left labia. Right adnexum displays no mass. Left adnexum displays no mass.  Genitourinary Comments: Cervix and uterus surgically absent.  Cuff well healed.  No masses or lesions.  Lymphadenopathy:       Right: No inguinal adenopathy present.       Left: No inguinal adenopathy present.  Skin: Skin is warm and dry.  Psychiatric: She has a normal mood and affect.   Entire vulva bathed in 3% acetic acid for > 3 minutes.  Entire vulva inspected with colposcope at 7.5X magnification.  Other then hypopigmentation where prior biopsy site on left was location, no abnormal appearing tissue noted.  No abnormal pigmentation, white tissue or vascular changes.  No biopsies obtained.  Chaperone was present for exam.  Assessment: H/O VIN 2 with positive margins  Plan: Repeat AEX and colposcopy in six months.

## 2017-03-04 DIAGNOSIS — Z9889 Other specified postprocedural states: Secondary | ICD-10-CM | POA: Diagnosis not present

## 2017-03-04 DIAGNOSIS — Z48812 Encounter for surgical aftercare following surgery on the circulatory system: Secondary | ICD-10-CM | POA: Diagnosis not present

## 2017-03-09 DIAGNOSIS — Z48812 Encounter for surgical aftercare following surgery on the circulatory system: Secondary | ICD-10-CM | POA: Diagnosis not present

## 2017-03-09 DIAGNOSIS — Z9889 Other specified postprocedural states: Secondary | ICD-10-CM | POA: Diagnosis not present

## 2017-03-09 DIAGNOSIS — Z5189 Encounter for other specified aftercare: Secondary | ICD-10-CM | POA: Diagnosis not present

## 2017-03-11 ENCOUNTER — Ambulatory Visit (INDEPENDENT_AMBULATORY_CARE_PROVIDER_SITE_OTHER): Payer: Medicare HMO | Admitting: *Deleted

## 2017-03-11 DIAGNOSIS — Z9889 Other specified postprocedural states: Secondary | ICD-10-CM | POA: Diagnosis not present

## 2017-03-11 DIAGNOSIS — Z48812 Encounter for surgical aftercare following surgery on the circulatory system: Secondary | ICD-10-CM | POA: Diagnosis not present

## 2017-03-11 DIAGNOSIS — Z5189 Encounter for other specified aftercare: Secondary | ICD-10-CM | POA: Diagnosis not present

## 2017-03-11 LAB — POCT INR: INR: 2.6

## 2017-03-15 ENCOUNTER — Telehealth: Payer: Self-pay | Admitting: *Deleted

## 2017-03-15 NOTE — Telephone Encounter (Signed)
Thank you :)

## 2017-03-15 NOTE — Telephone Encounter (Signed)
-----   Message from [name], MD sent at 03/12/2017  4:56 PM EDT ----- Madaline Brilliant thanks.   ----- Message ----- From: Margretta Sidle, RN Sent: 03/11/2017  12:33 PM To: [name], MD  Dr Fletcher Anon  Mrs Yowell stopped her Amiodarone on April 29th per direction from Dr Roxy Manns . See his office note of April 16th Thanks Elbert Ewings RN [facility]

## 2017-03-16 DIAGNOSIS — Z48812 Encounter for surgical aftercare following surgery on the circulatory system: Secondary | ICD-10-CM | POA: Diagnosis not present

## 2017-03-16 DIAGNOSIS — Z9889 Other specified postprocedural states: Secondary | ICD-10-CM | POA: Diagnosis not present

## 2017-03-16 DIAGNOSIS — Z5189 Encounter for other specified aftercare: Secondary | ICD-10-CM | POA: Diagnosis not present

## 2017-03-18 DIAGNOSIS — Z9889 Other specified postprocedural states: Secondary | ICD-10-CM | POA: Diagnosis not present

## 2017-03-18 DIAGNOSIS — Z5189 Encounter for other specified aftercare: Secondary | ICD-10-CM | POA: Diagnosis not present

## 2017-03-18 DIAGNOSIS — Z48812 Encounter for surgical aftercare following surgery on the circulatory system: Secondary | ICD-10-CM | POA: Diagnosis not present

## 2017-03-23 DIAGNOSIS — Z5189 Encounter for other specified aftercare: Secondary | ICD-10-CM | POA: Diagnosis not present

## 2017-03-23 DIAGNOSIS — Z9889 Other specified postprocedural states: Secondary | ICD-10-CM | POA: Diagnosis not present

## 2017-03-23 DIAGNOSIS — Z48812 Encounter for surgical aftercare following surgery on the circulatory system: Secondary | ICD-10-CM | POA: Diagnosis not present

## 2017-03-25 ENCOUNTER — Ambulatory Visit (INDEPENDENT_AMBULATORY_CARE_PROVIDER_SITE_OTHER): Payer: Medicare HMO | Admitting: Pharmacist

## 2017-03-25 DIAGNOSIS — Z9889 Other specified postprocedural states: Secondary | ICD-10-CM | POA: Diagnosis not present

## 2017-03-25 DIAGNOSIS — Z48812 Encounter for surgical aftercare following surgery on the circulatory system: Secondary | ICD-10-CM | POA: Diagnosis not present

## 2017-03-25 DIAGNOSIS — Z5189 Encounter for other specified aftercare: Secondary | ICD-10-CM | POA: Diagnosis not present

## 2017-03-25 LAB — POCT INR: INR: 2.5

## 2017-03-28 DIAGNOSIS — G4733 Obstructive sleep apnea (adult) (pediatric): Secondary | ICD-10-CM | POA: Diagnosis not present

## 2017-03-30 DIAGNOSIS — Z48812 Encounter for surgical aftercare following surgery on the circulatory system: Secondary | ICD-10-CM | POA: Diagnosis not present

## 2017-03-30 DIAGNOSIS — Z9889 Other specified postprocedural states: Secondary | ICD-10-CM | POA: Diagnosis not present

## 2017-03-30 DIAGNOSIS — Z5189 Encounter for other specified aftercare: Secondary | ICD-10-CM | POA: Diagnosis not present

## 2017-04-01 DIAGNOSIS — Z48812 Encounter for surgical aftercare following surgery on the circulatory system: Secondary | ICD-10-CM | POA: Diagnosis not present

## 2017-04-01 DIAGNOSIS — Z9889 Other specified postprocedural states: Secondary | ICD-10-CM | POA: Diagnosis not present

## 2017-04-01 DIAGNOSIS — Z5189 Encounter for other specified aftercare: Secondary | ICD-10-CM | POA: Diagnosis not present

## 2017-04-06 DIAGNOSIS — Z48812 Encounter for surgical aftercare following surgery on the circulatory system: Secondary | ICD-10-CM | POA: Diagnosis not present

## 2017-04-06 DIAGNOSIS — Z9889 Other specified postprocedural states: Secondary | ICD-10-CM | POA: Diagnosis not present

## 2017-04-06 DIAGNOSIS — Z5189 Encounter for other specified aftercare: Secondary | ICD-10-CM | POA: Diagnosis not present

## 2017-04-07 ENCOUNTER — Other Ambulatory Visit (INDEPENDENT_AMBULATORY_CARE_PROVIDER_SITE_OTHER): Payer: Medicare HMO

## 2017-04-07 ENCOUNTER — Ambulatory Visit (INDEPENDENT_AMBULATORY_CARE_PROVIDER_SITE_OTHER): Payer: Medicare HMO | Admitting: Internal Medicine

## 2017-04-07 ENCOUNTER — Encounter: Payer: Self-pay | Admitting: Internal Medicine

## 2017-04-07 VITALS — BP 150/78 | HR 63 | Temp 98.2°F | Resp 20 | Ht 66.0 in | Wt 220.0 lb

## 2017-04-07 DIAGNOSIS — I10 Essential (primary) hypertension: Secondary | ICD-10-CM | POA: Diagnosis not present

## 2017-04-07 DIAGNOSIS — Z0000 Encounter for general adult medical examination without abnormal findings: Secondary | ICD-10-CM

## 2017-04-07 DIAGNOSIS — Z1159 Encounter for screening for other viral diseases: Secondary | ICD-10-CM | POA: Diagnosis not present

## 2017-04-07 DIAGNOSIS — Z87891 Personal history of nicotine dependence: Secondary | ICD-10-CM

## 2017-04-07 DIAGNOSIS — Z1211 Encounter for screening for malignant neoplasm of colon: Secondary | ICD-10-CM

## 2017-04-07 DIAGNOSIS — E785 Hyperlipidemia, unspecified: Secondary | ICD-10-CM

## 2017-04-07 LAB — CBC
HEMATOCRIT: 31.4 % — AB (ref 36.0–46.0)
Hemoglobin: 9.9 g/dL — ABNORMAL LOW (ref 12.0–15.0)
MCHC: 31.4 g/dL (ref 30.0–36.0)
MCV: 79.2 fl (ref 78.0–100.0)
Platelets: 335 10*3/uL (ref 150.0–400.0)
RBC: 3.96 Mil/uL (ref 3.87–5.11)
RDW: 16.7 % — ABNORMAL HIGH (ref 11.5–15.5)
WBC: 6.7 10*3/uL (ref 4.0–10.5)

## 2017-04-07 LAB — COMPREHENSIVE METABOLIC PANEL
ALBUMIN: 4.5 g/dL (ref 3.5–5.2)
ALT: 23 U/L (ref 0–35)
AST: 19 U/L (ref 0–37)
Alkaline Phosphatase: 122 U/L — ABNORMAL HIGH (ref 39–117)
BILIRUBIN TOTAL: 0.5 mg/dL (ref 0.2–1.2)
BUN: 20 mg/dL (ref 6–23)
CHLORIDE: 102 meq/L (ref 96–112)
CO2: 29 mEq/L (ref 19–32)
CREATININE: 0.95 mg/dL (ref 0.40–1.20)
Calcium: 9.5 mg/dL (ref 8.4–10.5)
GFR: 65.16 mL/min (ref >60.00)
Glucose, Bld: 119 mg/dL — ABNORMAL HIGH (ref 70–99)
Potassium: 3.6 mEq/L (ref 3.5–5.1)
Sodium: 140 mEq/L (ref 135–145)
TOTAL PROTEIN: 7.7 g/dL (ref 6.0–8.3)

## 2017-04-07 LAB — LIPID PANEL
CHOLESTEROL: 147 mg/dL (ref 0–200)
HDL: 50 mg/dL (ref >39.00)
LDL CALC: 67 mg/dL (ref 0–99)
NonHDL: 97.19
TRIGLYCERIDES: 153 mg/dL — AB (ref 0.0–149.0)
Total CHOL/HDL Ratio: 3
VLDL: 30.6 mg/dL (ref 0.0–40.0)

## 2017-04-07 LAB — HEMOGLOBIN A1C: Hgb A1c MFr Bld: 5.8 % (ref 4.6–6.5)

## 2017-04-07 NOTE — Progress Notes (Signed)
   Subjective:    Patient ID: Kayla Hopkins, female    DOB: [birth year], 56 y.o.   MRN: [id number]  HPI The patient is a 56 YO female coming in for physical. No new concerns.   PMH, [facility], social history reviewed and updated.   Review of Systems  Constitutional: Negative.   HENT: Negative.   Eyes: Negative.   Respiratory: Negative for cough, chest tightness and shortness of breath.   Cardiovascular: Negative for chest pain, palpitations and leg swelling.  Gastrointestinal: Negative for abdominal distention, abdominal pain, constipation, diarrhea, nausea and vomiting.  Musculoskeletal: Negative.   Skin: Negative.   Neurological: Negative.   Psychiatric/Behavioral: Negative.       Objective:   Physical Exam  Constitutional: She is oriented to person, place, and time. She appears well-developed and well-nourished.  Overweight  HENT:  Head: Normocephalic and atraumatic.  Eyes: EOM are normal.  Neck: Normal range of motion.  Cardiovascular: Normal rate and regular rhythm.   Pulmonary/Chest: Effort normal and breath sounds normal. No respiratory distress. She has no wheezes. She has no rales.  Abdominal: Soft. Bowel sounds are normal. She exhibits no distension. There is no tenderness. There is no rebound.  Musculoskeletal: She exhibits no edema.  Neurological: She is alert and oriented to person, place, and time. Coordination normal.  Skin: Skin is warm and dry.  Psychiatric: She has a normal mood and affect.   Vitals:   04/07/17 0924 04/07/17 1004  BP: (!) 148/86 (!) 150/78  Pulse: 63   Resp: 20   Temp: 98.2 F (36.8 C)   SpO2: 99%   Weight: 220 lb (99.8 kg)   Height: 5\' 6"  (1.676 m)       Assessment & Plan:

## 2017-04-07 NOTE — Progress Notes (Signed)
Subjective:   Kayla Hopkins is a 56 y.o. female who presents for an Initial Medicare Annual Wellness Visit.  Review of Systems    No ROS.  Medicare Wellness Visit.  Cardiac Risk Factors include: advanced age (>22men, >9 women);dyslipidemia;hypertension Sleep patterns: has frequent nighttime awakenings, gets up 1 times nightly to void and sleeps 4-5 hours nightly. Patient reports insomnia issues, discussed recommended sleep tips and stress reduction tips, education was attached to patient's AVS.  Home Safety/Smoke Alarms:Feels safe in home. Smoke alarms in place.     Living environment; residence and Firearm Safety: 1-story house/ trailer, no firearms. Lives with fiancee, no DME needed Seat Belt Safety/Bike Helmet: Wears seat belt.   Counseling:   Eye Exam- appointment every other year, patient states she will make an appointment Dental- dentures  Female:   Pap- N/A, Dr. Janeann Merl, has appointment in 3 months, patient states Dr. Sabra Heck will address mammo, pap, and Dexa scan   Mammo-  Last  03/26/15, BI-RADS CATEGORY  3: Probably benign     Dexa scan- none       [facility]- referral placed today     Objective:    Today's Vitals   04/07/17 0924 04/07/17 1004  BP: (!) 148/86 (!) 150/78  Pulse: 63   Resp: 20   Temp: 98.2 F (36.8 C)   SpO2: 99%   Weight: 220 lb (99.8 kg)   Height: 5\' 6"  (1.676 m)    Body mass index is 35.51 kg/m.   Current Medications (verified) Outpatient Encounter Prescriptions as of 04/07/2017  Medication Sig  . acetaminophen (TYLENOL) 500 MG tablet Take 1,000 mg by mouth every 6 (six) hours as needed for mild pain.  [name] atorvastatin (LIPITOR) 40 MG tablet Take 1 tablet (40 mg total) by mouth daily. (Patient taking differently: Take 40 mg by mouth daily at 6 PM. )  . esomeprazole (NEXIUM) 20 MG capsule Take 20 mg by mouth at bedtime.   . iron polysaccharides (NIFEREX) 150 MG capsule Take 1 capsule (150 mg total) by mouth daily.  [name] lisinopril  (PRINIVIL,ZESTRIL) 2.5 MG tablet Take 1 tablet (2.5 mg total) by mouth daily.  . metoprolol succinate (TOPROL-XL) 25 MG 24 hr tablet Take 1 tablet (25 mg total) by mouth daily.  [name] OVER THE COUNTER MEDICATION Inhale 1 application into the lungs at bedtime. CPAP  . potassium chloride SA (K-DUR,KLOR-CON) 20 MEQ tablet Take 20 mEq by mouth daily.  [name] warfarin (COUMADIN) 4 MG tablet Take 1 tablet daily or as directed by coumadin clinic  . furosemide (LASIX) 40 MG tablet Take 1 tablet (40 mg total) by mouth daily.  . [DISCONTINUED] amiodarone (PACERONE) 200 MG tablet Take 1 tablet (200 mg total) by mouth daily. (Patient not taking: Reported on 04/07/2017)   No facility-administered encounter medications on file as of 04/07/2017.     Allergies (verified) Pradaxa [dabigatran etexilate mesylate] and Zocor [simvastatin]   History: Past Medical History:  Diagnosis Date  . Anxiety   . Arthritis    "knees, back" (02/11/2016)  . Atrial fibrillation ([place])    a. CHA2DS2VASc = 4;  b. 12/10/2016 s/p Maze @ time of MVR/TVR.  [name] Bruises easily    d/t being on Eliquis  . Chronic back pain    stenosis  . Chronic diastolic CHF (congestive heart failure) ([place])    a. 02/2016 Echo; EF 55-60%, no rwma, mild to mod MR, mod dil LA, PASP 37mmHg;  b. 10/2016 TEE: nl EF, mod MR, mod-sev TR.  [name]  Claudication ([place])       . Daily headache   . Depression   . GERD (gastroesophageal reflux disease)    takes Nexium nightly  . History of migraine    last one a couple of wks ago  . Hyperlipidemia    takes Atorvastatin daily  . Hypertension    takes Diltiazem and Diltiazem daily  . Migraine    "probably have 3/year; had one today; tried several medications-no relief" (02/11/2016)  . Moderate mitral regurgitation    a. 10/2016 TEE: nl EF, mod MR, mod to severe LAE, mod to sev TR; b. 12/10/2016 MVR (Sorin [employer] Annuloflex posterior annuloplasty band (45mm, ref# [id number], ser # [id number]).  . Moderate to Severe Tricuspid  regurgitation    a. 10/2014 TEE: mod-sev TR; b. 12/10/2016 s/p TVR w/ Edwards mc3 ring annuloplasty (57mm, model #4900, ser # [id number]).  . Non-obstructive CAD (coronary artery disease)    a. 11/2016 Cath: mRCA 10, otw nl cors, EF 55-65%, 3+MR.  [name] Obstructive sleep apnea    wears CPAP  . Peripheral vascular disease ([place]) 06/11/2009   a. 06/2009 Bilateral common iliac kissing stents (8X24 mm);  b. 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 She underwent successful balloon angioplasty;  c. Restenosis in RCIA in 12/13 s/p  Covered stent; d. 05/2013: 70% in distal left common iliac artery s/p self expanding stent placement; e. 08/2016 ABI: R 1.1, L 0.99, nl Abd Ao.  [name] Persistent atrial fibrillation ([facility])    a. CHA2DS2VASc = 4-->prev eliquis, now coumadin; b. s/p prior DCCV x 2;  c. failed medical therapy-->poor candidate for PVI 2/2 mod MR and LAE;  d. Complete bilateral atrial lesion set using cryothermy and bipolar radiofrequency ablation via median sternotomy with clipping of LA appendage.  [name] PTSD (post-traumatic stress disorder)   . Urinary frequency   . Urinary urgency   . Vertigo    Past Surgical History:  Procedure Laterality Date  . ABDOMINAL ANGIOGRAM N/A 05/11/2012   Procedure: ABDOMINAL ANGIOGRAM;  Surgeon: [name], MD;  Location: [facility] CATH LAB;  Service: Cardiovascular;  Laterality: N/A;  . ABDOMINAL AORTAGRAM N/A 10/19/2012   Procedure: ABDOMINAL Maxcine Ham;  Surgeon: [name], MD;  Location: [place] CATH LAB;  Service: Cardiovascular;  Laterality: N/A;  . ABDOMINAL AORTAGRAM N/A 05/10/2013   Procedure: ABDOMINAL Maxcine Ham;  Surgeon: [name], MD;  Location: [facility] CATH LAB;  Service: Cardiovascular;  Laterality: N/A;  . BACK SURGERY    . CARDIAC CATHETERIZATION  2009   no significant obstruction  . CARDIAC CATHETERIZATION N/A 12/02/2016   Procedure: Right/Left Heart Cath and Coronary Angiography;  Surgeon:  [name], MD;  Location: [facility] CV LAB;  Service: Cardiovascular;  Laterality: N/A;  . CARDIOVERSION N/A 03/19/2016   Procedure: CARDIOVERSION;  Surgeon: Pixie Casino, MD;  Location[facility] ENDOSCOPY;  Service: Cardiovascular;  Laterality: N/A;  . CARDIOVERSION N/A 07/10/2016   Procedure: CARDIOVERSION;  Surgeon: Pixie Casino, MD;  Location: [facility] ENDOSCOPY;  Service: Cardiovascular;  Laterality: N/A;  . ILIAC ARTERY STENT Bilateral 06/2009   common iliac kissing stents (8X24 mm)  . KNEE ARTHROSCOPY Bilateral   . LUMBAR DISC SURGERY     spinal stenosis  . MAZE N/A 12/10/2016   Procedure: MAZE;  Surgeon: [name], MD;  Location: [place];  Service: Open Heart Surgery;  Laterality: N/A;  . MITRAL VALVE REPAIR N/A 12/10/2016   Procedure: MITRAL VALVE REPAIR (  MVR);  Surgeon: [name], MD;  Location: [place];  Service: Open Heart Surgery;  Laterality: N/A;  . MULTIPLE EXTRACTIONS WITH ALVEOLOPLASTY N/A 11/12/2016   Procedure: EXTRACTION OF TOOTH #'S 18, 20-29, AND 31 WITH  ALVEOLOPLASTY AND BILATERAL MANDIBULAR TORI REDUCTIONS.;  Surgeon: Lenn Cal, DDS;  Location: [place];  Service: Oral Surgery;  Laterality: N/A;  . ROBOTIC ASSISTED TOTAL HYSTERECTOMY WITH SALPINGECTOMY Bilateral 09/23/2015   Procedure: ROBOTIC ASSISTED TOTAL HYSTERECTOMY BILATERALSALPINGECTOMY AND OOPHORECTOMY;  Surgeon: [name], MD;  Location: [facility];  Service: Gynecology;  Laterality: Bilateral;  . TEE WITHOUT CARDIOVERSION N/A 10/13/2016   Procedure: TRANSESOPHAGEAL ECHOCARDIOGRAM (TEE);  Surgeon: Lelon Perla, MD;  Location: [facility]OSCOPY;  Service: Cardiovascular;  Laterality: N/A;  . TEE WITHOUT CARDIOVERSION N/A 12/10/2016   Procedure: TRANSESOPHAGEAL ECHOCARDIOGRAM (TEE);  Surgeon: [name], MD;  Location: [place];  Service: Open Heart Surgery;  Laterality: N/A;  . TRICUSPID VALVE REPLACEMENT N/A 12/10/2016   Procedure: TRICUSPID VALVE REPAIR;  Surgeon: [name], MD;  Location: [place];   Service: Open Heart Surgery;  Laterality: N/A;  . TUBAL LIGATION  1991  . VULVECTOMY N/A 09/23/2015   Procedure: WIDE EXCISION VULVECTOMY  WLE of vulvar VIN 2;  Surgeon: [name], MD;  Location: [facility];  Service: Gynecology;  Laterality: N/A;   Family History  Problem Relation Age of Onset  . Hypertension Mother   . Ovarian cancer Mother   . Lung cancer Mother   . Heart disease Father   . Heart attack Father   . Hypertension Brother   . Colon polyps Sister   . Lung cancer Maternal Grandfather   . Throat cancer Paternal Grandfather    Social History   Occupational History  . Not on file.   Social History Main Topics  . Smoking status: Former Smoker    Packs/day: 0.10    Years: 37.00    Types: Cigarettes    Quit date: 11/10/2015  . Smokeless tobacco: Never Used     Comment:  "using Media pl[employer]  . Alcohol use 0.0 oz/week     Comment: occcasional  . Drug use: No  . Sexual activity: Yes    Partners: Male    Tobacco Counseling Counseling given: Not Answered   Activities of Daily Living In your present state of health, do you have any difficulty performing the following activities: 04/07/2017 12/13/2016  Hearing? N N  Vision? N N  Difficulty concentrating or making decisions? - N  Walking or climbing stairs? N N  Dressing or bathing? N N  Doing errands, shopping? N N  Preparing Food and eating ? N -  Using the Toilet? N -  In the past six months, have you accidently leaked urine? N -  Do you have problems with loss of bowel control? N -  Managing your Medications? N -  Managing your Finances? N -  Housekeeping or managing your Housekeeping? N -  Some recent data might be hidden    Immunizations and Health Maintenance Immunization History  Administered Date(s) Administered  . Influenza,inj,Quad PF,36+ Mos 09/23/2015, 12/16/2016  . Pneumococcal Polysaccharide-23 12/16/2016   Health Maintenance Due  Topic Date Due  . Hepatitis C Screening   July 18, 1961  . FOOT EXAM  04/14/1973  . OPHTHALMOLOGY EXAM  04/14/1973  . TETANUS/TDAP  04/14/1982  . COLONOSCOPY  04/14/2013  . MAMMOGRAM  03/25/2017    Patient Care Team: Hoyt Koch, MD as PCP - General (Internal Medicine)  Dannielle Karvonen, RN as [place] any recent Medical Services you may have received from other than [facility] providers in the past year (date may be approximate).     Assessment:   This is a routine wellness examination for Kayla Hopkins. Physical assessment deferred to PCP.   Hearing/Vision screen Hearing Screening Comments: Able to hear conversational tones w/o difficulty. No issues reported.   Vision Screening Comments: Wears glasses  Dietary issues and exercise activities discussed: Current Exercise Habits: Structured exercise class, Type of exercise: walking, Time (Minutes): 45, Frequency (Times/Week): 5, Weekly Exercise (Minutes/Week): 225, Intensity: Moderate, Exercise limited by: cardiac condition(s)  Diet (meal preparation, eat out, water intake, caffeinated beverages, dairy products, fruits and vegetables): in general, a "healthy" diet  , well balanced, low fat/ cholesterol, low salteats a variety of fruits and vegetables daily, limits salt, fat/cholesterol, sugar, caffeine, drinks 6-8 glasses of water daily.    Goals    . Increase my strength and lose weight to be under 200 pounds          Continue to do physical therapy, go to the [facility], eat healthy.      Depression Screen PHQ 2/9 Scores 04/07/2017 02/10/2016 05/11/2015  PHQ - 2 Score 0 0 0    Fall Risk Fall Risk  04/07/2017 02/10/2016  Falls in the past year? No No    Cognitive Function:       Ad8 score reviewed for issues:  Issues making decisions: no  Less interest in hobbies / activities: no  Repeats questions, stories (family complaining): no  Trouble using ordinary gadgets (microwave, computer, phone): no  Forgets the month or year:  no  Mismanaging finances: no  Remembering appts: no  Daily problems with thinking and/or memory: no Ad8 score is= 0   Screening Tests Health Maintenance  Topic Date Due  . Hepatitis C Screening  January 17, 1961  . FOOT EXAM  04/14/1973  . OPHTHALMOLOGY EXAM  04/14/1973  . TETANUS/TDAP  04/14/1982  . COLONOSCOPY  04/14/2013  . MAMMOGRAM  03/25/2017  . HEMOGLOBIN A1C  06/07/2017  . INFLUENZA VACCINE  06/09/2017  . PAP SMEAR  08/15/2019  . PNEUMOCOCCAL POLYSACCHARIDE VACCINE (2) 12/16/2021  . HIV Screening  Completed      Plan:    Continue to eat heart healthy diet (full of fruits, vegetables, whole grains, lean protein, water--limit salt, fat, and sugar intake) and increase physical activity as tolerated.  I have personally reviewed and noted the following in the patient's chart:   . Medical and social history . Use of alcohol, tobacco or illicit drugs  . Current medications and supplements . Functional ability and status . Nutritional status . Physical activity . Advanced directives . List of other physicians . Vitals . Screenings to include cognitive, depression, and falls . Referrals and appointments  In addition, I have reviewed and discussed with patient certain preventive protocols, quality metrics, and best practice recommendations. A written personalized care plan for preventive services as well as general preventive health recommendations were provided to patient.     Michiel Cowboy, RN   04/07/2017

## 2017-04-07 NOTE — Patient Instructions (Signed)
Continue to eat heart healthy diet (full of fruits, vegetables, whole grains, lean protein, water--limit salt, fat, and sugar intake) and increase physical activity as tolerated.   Kayla Hopkins , Thank you for taking time to come for your Medicare Wellness Visit. I appreciate your ongoing commitment to your health goals. Please review the following plan we discussed and let me know if I can assist you in the future.   These are the goals we discussed: Goals    . Increase my strength and lose weight to be under 200 pounds          Continue to do physical therapy, go to the [facility], eat healthy.       This is a list of the screening recommended for you and due dates:  Health Maintenance  Topic Date Due  .  Hepatitis C: One time screening is recommended by Center for Disease Control  (CDC) for  adults born from [birth year] through 1963.   04/30/1961  . Complete foot exam   04/14/1973  . Eye exam for diabetics  04/14/1973  . Tetanus Vaccine  04/14/1982  . Colon Cancer Screening  04/14/2013  . Mammogram  03/25/2017  . Hemoglobin A1C  06/07/2017  . Flu Shot  06/09/2017  . Pap Smear  08/15/2019  . Pneumococcal vaccine (2) 12/16/2021  . HIV Screening  Completed    Stress and Stress Management Stress is a normal reaction to life events. It is what you feel when life demands more than you are used to or more than you can handle. Some stress can be useful. For example, the stress reaction can help you catch the last bus of the day, study for a test, or meet a deadline at work. But stress that occurs too often or for too long can cause problems. It can affect your emotional health and interfere with relationships and normal daily activities. Too much stress can weaken your immune system and increase your risk for physical illness. If you already have a medical problem, stress can make it worse. What are the causes? All sorts of life events may cause stress. An event that causes stress for one person may  not be stressful for another person. Major life events commonly cause stress. These may be positive or negative. Examples include losing your job, moving into a new home, getting married, having a baby, or losing a loved one. Less obvious life events may also cause stress, especially if they occur day after day or in combination. Examples include working long hours, driving in traffic, caring for children, being in debt, or being in a difficult relationship. What are the signs or symptoms? Stress may cause emotional symptoms including, the following:  Anxiety. This is feeling worried, afraid, on edge, overwhelmed, or out of control.  Anger. This is feeling irritated or impatient.  Depression. This is feeling sad, down, helpless, or guilty.  Difficulty focusing, remembering, or making decisions. Stress may cause physical symptoms, including the following:  Aches and pains. These may affect your head, neck, back, stomach, or other areas of your body.  Tight muscles or clenched jaw.  Low energy or trouble sleeping. Stress may cause unhealthy behaviors, including the following:  Eating to feel better (overeating) or skipping meals.  Sleeping too little, too much, or both.  Working too much or putting off tasks (procrastination).  Smoking, drinking alcohol, or using drugs to feel better. How is this diagnosed? Stress is diagnosed through an assessment by your health care provider.  Your health care provider will ask questions about your symptoms and any stressful life events.Your health care provider will also ask about your medical history and may order blood tests or other tests. Certain medical conditions and medicine can cause physical symptoms similar to stress. Mental illness can cause emotional symptoms and unhealthy behaviors similar to stress. Your health care provider may refer you to a mental health professional for further evaluation. How is this treated? Stress management is the  recommended treatment for stress.The goals of stress management are reducing stressful life events and coping with stress in healthy ways. Techniques for reducing stressful life events include the following:  Stress identification. Self-monitor for stress and identify what causes stress for you. These skills may help you to avoid some stressful events.  Time management. Set your priorities, keep a calendar of events, and learn to say "no." These tools can help you avoid making too many commitments. Techniques for coping with stress include the following:  Rethinking the problem. Try to think realistically about stressful events rather than ignoring them or overreacting. Try to find the positives in a stressful situation rather than focusing on the negatives.  Exercise. Physical exercise can release both physical and emotional tension. The key is to find a form of exercise you enjoy and do it regularly.  Relaxation techniques. These relax the body and mind. Examples include yoga, meditation, tai chi, biofeedback, deep breathing, progressive muscle relaxation, listening to music, being out in nature, journaling, and other hobbies. Again, the key is to find one or more that you enjoy and can do regularly.  Healthy lifestyle. Eat a balanced diet, get plenty of sleep, and do not smoke. Avoid using alcohol or drugs to relax.  Strong support network. Spend time with family, friends, or other people you enjoy being around.Express your feelings and talk things over with someone you trust. Counseling or talktherapy with a mental health professional may be helpful if you are having difficulty managing stress on your own. Medicine is typically not recommended for the treatment of stress.Talk to your health care provider if you think you need medicine for symptoms of stress. Follow these instructions at home:  Keep all follow-up visits as directed by your health care provider.  Take all medicines as  directed by your health care provider. Contact a health care provider if:  Your symptoms get worse or you start having new symptoms.  You feel overwhelmed by your problems and can no longer manage them on your own. Get help right away if:  You feel like hurting yourself or someone else. This information is not intended to replace advice given to you by your health care provider. Make sure you discuss any questions you have with your health care provider. Document Released: 04/21/2001 Document Revised: 04/02/2016 Document Reviewed: 06/20/2013 Elsevier Interactive Patient Education  2017 [place]. Insomnia Insomnia is a sleep disorder that makes it difficult to fall asleep or to stay asleep. Insomnia can cause tiredness (fatigue), low energy, difficulty concentrating, mood swings, and poor performance at work or school. There are three different ways to classify insomnia:  Difficulty falling asleep.  Difficulty staying asleep.  Waking up too early in the morning. Any type of insomnia can be long-term (chronic) or short-term (acute). Both are common. Short-term insomnia usually lasts for three months or less. Chronic insomnia occurs at least three times a week for longer than three months. What are the causes? Insomnia may be caused by another condition, situation, or substance, such  as:  Anxiety.  Certain medicines.  Gastroesophageal reflux disease (GERD) or other gastrointestinal conditions.  Asthma or other breathing conditions.  Restless legs syndrome, sleep apnea, or other sleep disorders.  Chronic pain.  Menopause. This may include hot flashes.  Stroke.  Abuse of alcohol, tobacco, or illegal drugs.  Depression.  Caffeine.  Neurological disorders, such as Alzheimer disease.  An overactive thyroid (hyperthyroidism). The cause of insomnia may not be known. What increases the risk? Risk factors for insomnia include:  Gender. Women are more commonly affected  than men.  Age. Insomnia is more common as you get older.  Stress. This may involve your professional or personal life.  Income. Insomnia is more common in people with lower income.  Lack of exercise.  Irregular work schedule or night shifts.  Traveling between different time zones. What are the signs or symptoms? If you have insomnia, trouble falling asleep or trouble staying asleep is the main symptom. This may lead to other symptoms, such as:  Feeling fatigued.  Feeling nervous about going to sleep.  Not feeling rested in the morning.  Having trouble concentrating.  Feeling irritable, anxious, or depressed. How is this treated? Treatment for insomnia depends on the cause. If your insomnia is caused by an underlying condition, treatment will focus on addressing the condition. Treatment may also include:  Medicines to help you sleep.  Counseling or therapy.  Lifestyle adjustments. Follow these instructions at home:  Take medicines only as directed by your health care provider.  Keep regular sleeping and waking hours. Avoid naps.  Keep a sleep diary to help you and your health care provider figure out what could be causing your insomnia. Include:  When you sleep.  When you wake up during the night.  How well you sleep.  How rested you feel the next day.  Any side effects of medicines you are taking.  What you eat and drink.  Make your bedroom a comfortable place where it is easy to fall asleep:  Put up shades or special blackout curtains to block light from outside.  Use a white noise machine to block noise.  Keep the temperature cool.  Exercise regularly as directed by your health care provider. Avoid exercising right before bedtime.  Use relaxation techniques to manage stress. Ask your health care provider to suggest some techniques that may work well for you. These may include:  Breathing exercises.  Routines to release muscle  tension.  Visualizing peaceful scenes.  Cut back on alcohol, caffeinated beverages, and cigarettes, especially close to bedtime. These can disrupt your sleep.  Do not overeat or eat spicy foods right before bedtime. This can lead to digestive discomfort that can make it hard for you to sleep.  Limit screen use before bedtime. This includes:  Watching TV.  Using your smartphone, tablet, and computer.  Stick to a routine. This can help you fall asleep faster. Try to do a quiet activity, brush your teeth, and go to bed at the same time each night.  Get out of bed if you are still awake after 15 minutes of trying to sleep. Keep the lights down, but try reading or doing a quiet activity. When you feel sleepy, go back to bed.  Make sure that you drive carefully. Avoid driving if you feel very sleepy.  Keep all follow-up appointments as directed by your health care provider. This is important. Contact a health care provider if:  You are tired throughout the day or have trouble in  your daily routine due to sleepiness.  You continue to have sleep problems or your sleep problems get worse. Get help right away if:  You have serious thoughts about hurting yourself or someone else. This information is not intended to replace advice given to you by your health care provider. Make sure you discuss any questions you have with your health care provider. Document Released: 10/23/2000 Document Revised: 03/27/2016 Document Reviewed: 07/27/2014 Elsevier Interactive Patient Education  2017 [employer].

## 2017-04-07 NOTE — Progress Notes (Signed)
Patient ID: Kayla Hopkins, female   DOB: Mar 26, 1961, 56 y.o.   MRN: [id number] Medical screening examination/treatment/procedure(s) were performed by non-physician practitioner and as supervising physician I was immediately available for consultation/collaboration. I agree with above. Hoyt Koch, MD

## 2017-04-07 NOTE — Progress Notes (Signed)
Pre visit review using our clinic review tool, if applicable. No additional management support is needed unless otherwise documented below in the visit note. 

## 2017-04-08 DIAGNOSIS — Z5189 Encounter for other specified aftercare: Secondary | ICD-10-CM | POA: Diagnosis not present

## 2017-04-08 DIAGNOSIS — Z9889 Other specified postprocedural states: Secondary | ICD-10-CM | POA: Diagnosis not present

## 2017-04-08 DIAGNOSIS — Z48812 Encounter for surgical aftercare following surgery on the circulatory system: Secondary | ICD-10-CM | POA: Diagnosis not present

## 2017-04-08 LAB — HEPATITIS C ANTIBODY: HCV AB: NEGATIVE

## 2017-04-09 DIAGNOSIS — Z0000 Encounter for general adult medical examination without abnormal findings: Secondary | ICD-10-CM | POA: Insufficient documentation

## 2017-04-09 NOTE — Assessment & Plan Note (Addendum)
Referral to GI for colonoscopy, reminded about mammogram. Checking hep c screening and labs. Counseled about sun safety and mole surveillance. Given 10 year screening recommendations. Pap smear up to date. Counseled about shingrix.

## 2017-04-09 NOTE — Assessment & Plan Note (Signed)
Encouraged complete smoking cessation and reminded her about the risks and harms of cigarette smoking on her health.

## 2017-04-09 NOTE — Assessment & Plan Note (Signed)
BP mildly elevated today but prior at goal on her lasix, lisinopril, metoprolol. Checking CMP and adjust as needed.

## 2017-04-09 NOTE — Assessment & Plan Note (Signed)
Checking lipid panel for goal LDL <100. Taking lipitor 40 mg daily.

## 2017-04-12 NOTE — Addendum Note (Signed)
Addendum  created 04/12/17 0951 by Oleta Mouse, MD   Sign clinical note

## 2017-04-13 DIAGNOSIS — Z48812 Encounter for surgical aftercare following surgery on the circulatory system: Secondary | ICD-10-CM | POA: Diagnosis not present

## 2017-04-13 DIAGNOSIS — Z9889 Other specified postprocedural states: Secondary | ICD-10-CM | POA: Diagnosis not present

## 2017-04-22 ENCOUNTER — Ambulatory Visit (INDEPENDENT_AMBULATORY_CARE_PROVIDER_SITE_OTHER): Payer: Medicare HMO | Admitting: *Deleted

## 2017-04-22 DIAGNOSIS — Z9889 Other specified postprocedural states: Secondary | ICD-10-CM

## 2017-04-22 DIAGNOSIS — Z48812 Encounter for surgical aftercare following surgery on the circulatory system: Secondary | ICD-10-CM | POA: Diagnosis not present

## 2017-04-22 LAB — POCT INR: INR: 2.6

## 2017-04-27 DIAGNOSIS — Z9889 Other specified postprocedural states: Secondary | ICD-10-CM | POA: Diagnosis not present

## 2017-04-27 DIAGNOSIS — Z48812 Encounter for surgical aftercare following surgery on the circulatory system: Secondary | ICD-10-CM | POA: Diagnosis not present

## 2017-04-28 DIAGNOSIS — G4733 Obstructive sleep apnea (adult) (pediatric): Secondary | ICD-10-CM | POA: Diagnosis not present

## 2017-04-29 DIAGNOSIS — Z9889 Other specified postprocedural states: Secondary | ICD-10-CM | POA: Diagnosis not present

## 2017-04-29 DIAGNOSIS — Z48812 Encounter for surgical aftercare following surgery on the circulatory system: Secondary | ICD-10-CM | POA: Diagnosis not present

## 2017-05-04 DIAGNOSIS — Z9889 Other specified postprocedural states: Secondary | ICD-10-CM | POA: Diagnosis not present

## 2017-05-04 DIAGNOSIS — Z48812 Encounter for surgical aftercare following surgery on the circulatory system: Secondary | ICD-10-CM | POA: Diagnosis not present

## 2017-05-06 DIAGNOSIS — Z48812 Encounter for surgical aftercare following surgery on the circulatory system: Secondary | ICD-10-CM | POA: Diagnosis not present

## 2017-05-06 DIAGNOSIS — Z9889 Other specified postprocedural states: Secondary | ICD-10-CM | POA: Diagnosis not present

## 2017-05-13 ENCOUNTER — Encounter: Payer: Self-pay | Admitting: Gastroenterology

## 2017-05-18 ENCOUNTER — Ambulatory Visit (INDEPENDENT_AMBULATORY_CARE_PROVIDER_SITE_OTHER): Payer: Medicare HMO | Admitting: *Deleted

## 2017-05-18 DIAGNOSIS — Z48812 Encounter for surgical aftercare following surgery on the circulatory system: Secondary | ICD-10-CM | POA: Diagnosis not present

## 2017-05-18 DIAGNOSIS — Z9889 Other specified postprocedural states: Secondary | ICD-10-CM | POA: Diagnosis not present

## 2017-05-18 LAB — POCT INR: INR: 3.5

## 2017-05-20 DIAGNOSIS — Z48812 Encounter for surgical aftercare following surgery on the circulatory system: Secondary | ICD-10-CM | POA: Diagnosis not present

## 2017-05-20 DIAGNOSIS — Z9889 Other specified postprocedural states: Secondary | ICD-10-CM | POA: Diagnosis not present

## 2017-05-20 IMAGING — CT CT HEAD W/O CM
2 series · 15 of 30 positions shown, 17 images · non-contrast
Comparison: March 08, 2014

CLINICAL DATA: Headache, primarily right periorbital

EXAM:
CT HEAD WITHOUT CONTRAST
TECHNIQUE: Contiguous axial images were obtained from the base of the skull
through the vertex without intravenous contrast.

[Series 2: head without · axial · non-contrast · 0.47mm/px · z∈[-120,-0]mm · 7 of 32 slices shown, 9 images]
[im 4/32  brain]
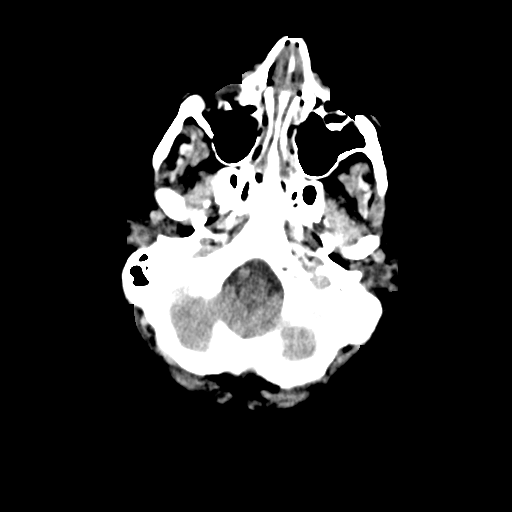
[im 4/32  bone]
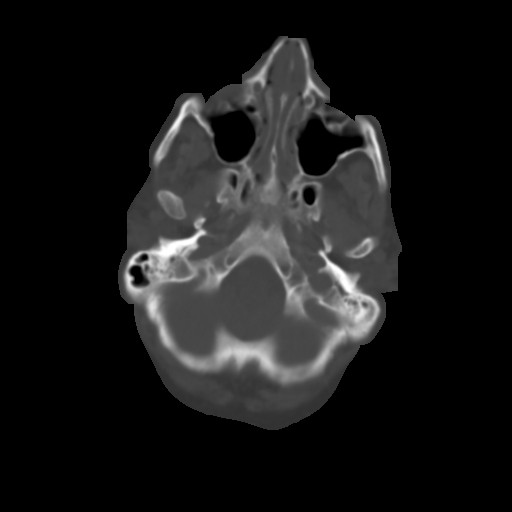
[im 8/32  brain]
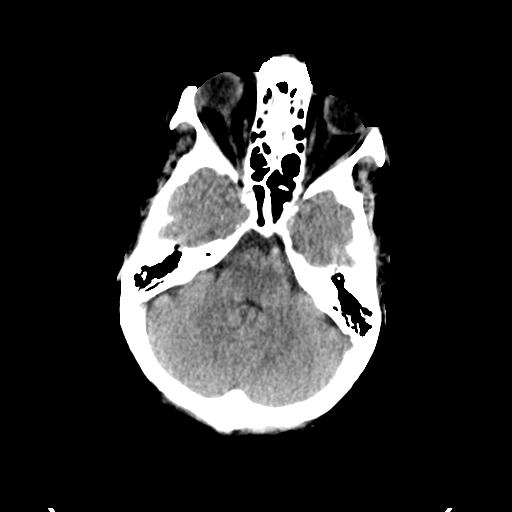
[im 12/32  brain]
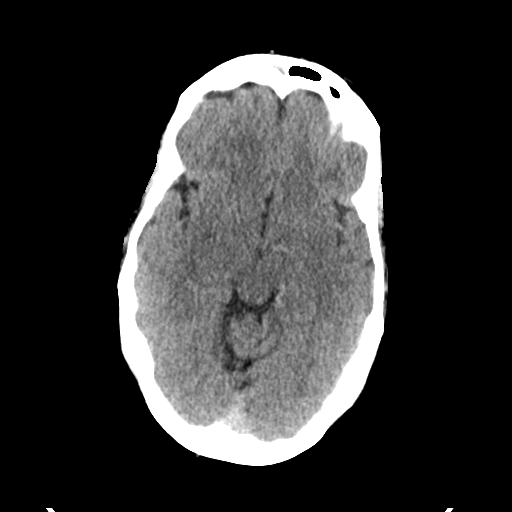
[im 16/32  brain]
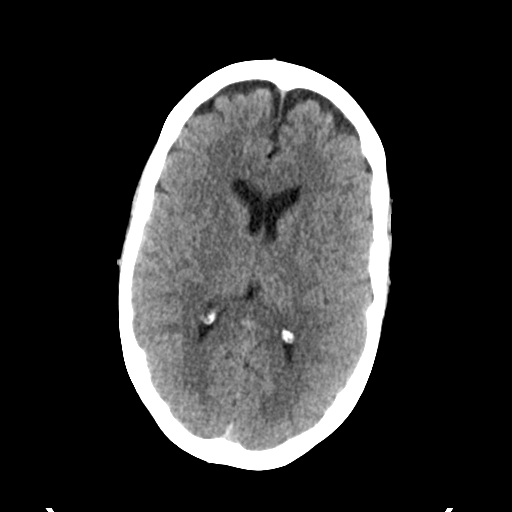
[im 20/32  brain]
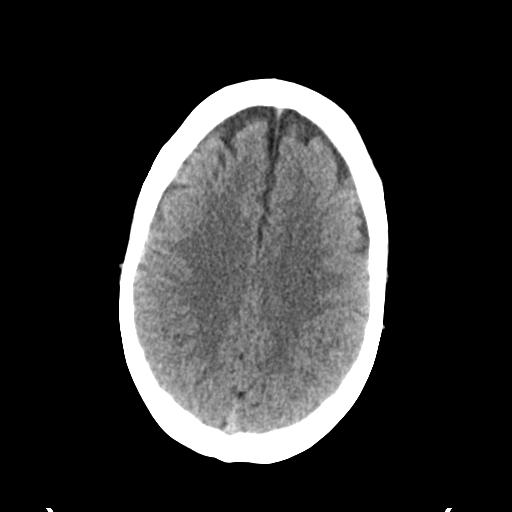
[im 20/32  bone]
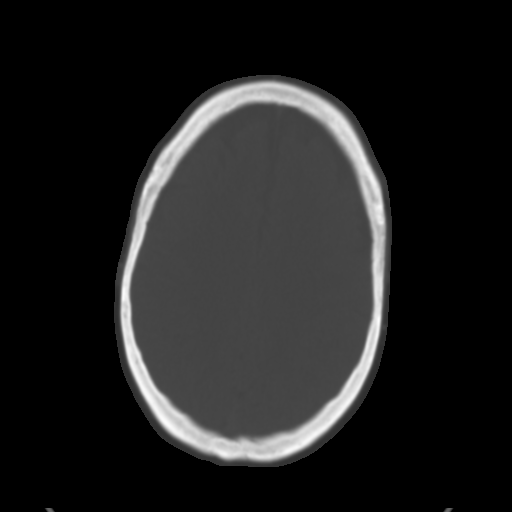
[im 24/32  brain]
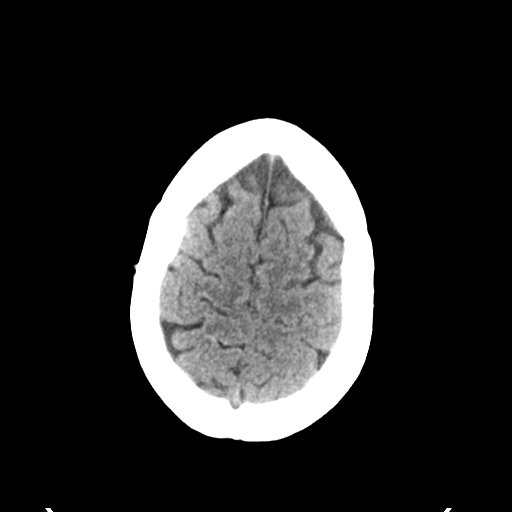
[im 28/32  brain]
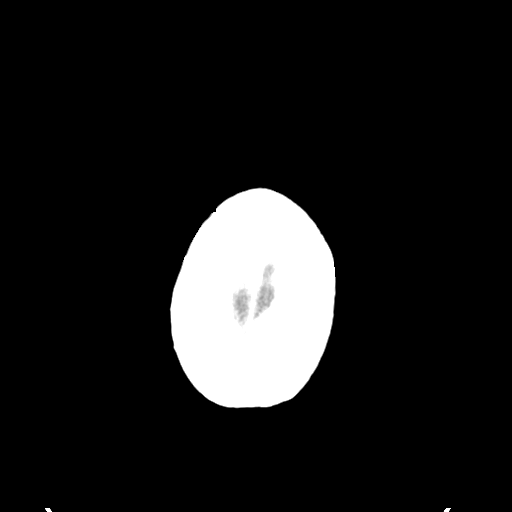

[Series 3: head bone · axial · 0.47mm/px · z∈[-121,+7]mm · 8 of 80 slices shown]
[im 8/80  bone]
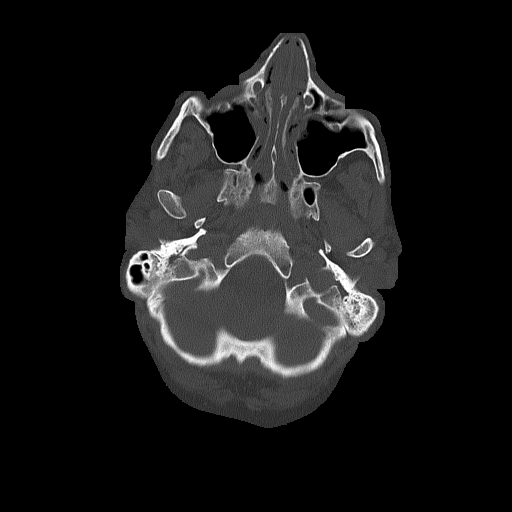
[im 16/80  bone]
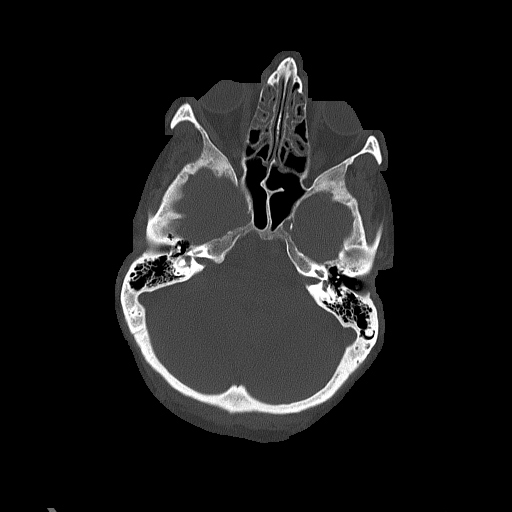
[im 24/80  bone]
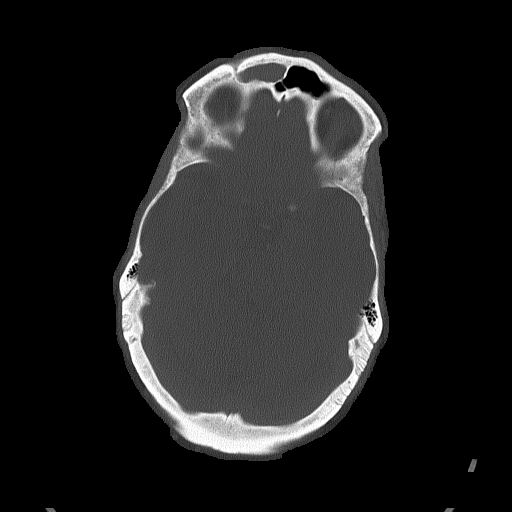
[im 36/80  bone]
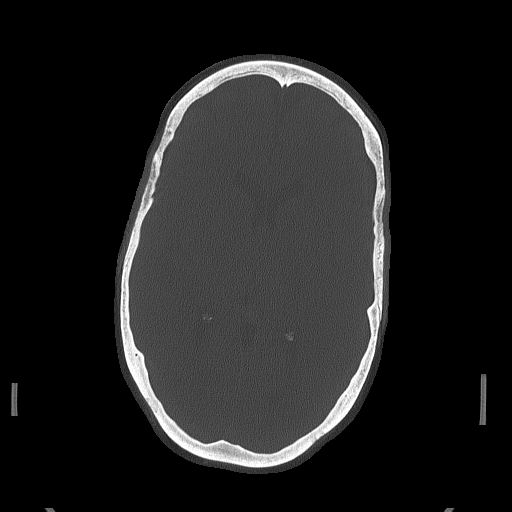
[im 44/80  bone]
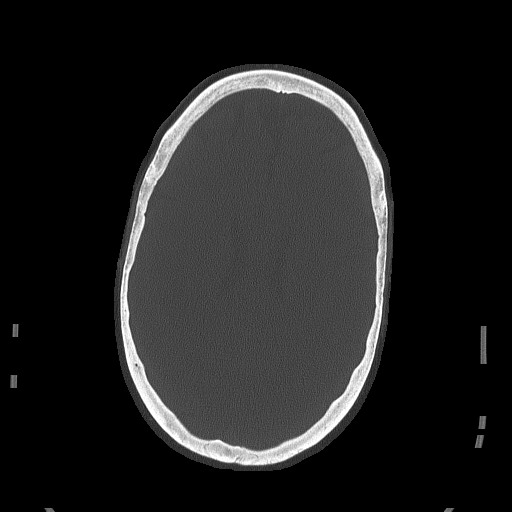
[im 56/80  bone]
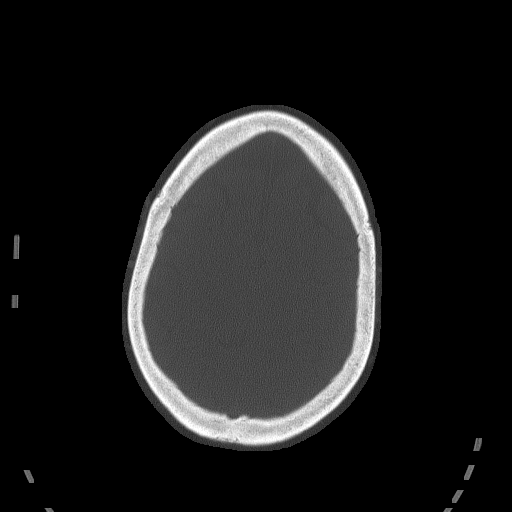
[im 64/80  bone]
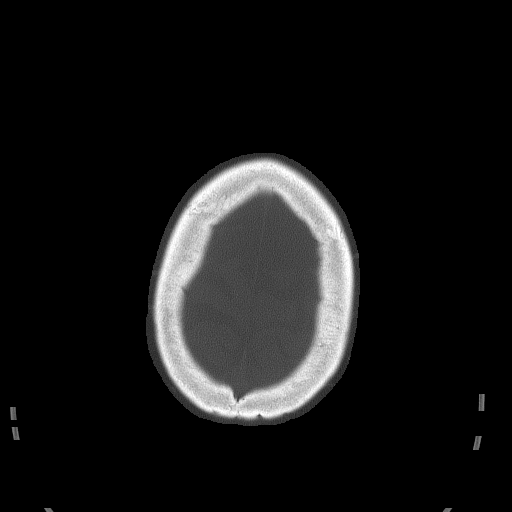
[im 72/80  bone]
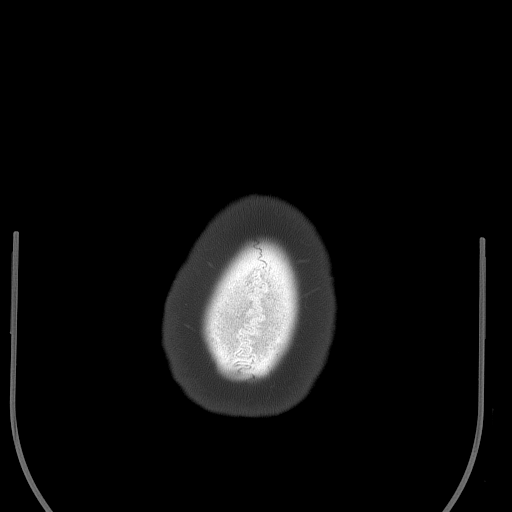

[15 of 30 positions shown; findings below may reference images not displayed]

FINDINGS: The ventricles are normal in size and configuration. There is slight
frontal atrophy bilaterally, stable. There is no intracranial mass,
hemorrhage, extra-axial fluid collection, or midline shift.
Gray-white compartments are normal. No acute infarct evident. The
bony calvarium appears intact. The mastoid air cells are clear.
There are no intraorbital lesions. There is opacification of
multiple ethmoid air cells bilaterally. There is diffuse
opacification of the right frontal sinus. There is nasal turbinate
edema with obstruction of the nares bilaterally.
IMPRESSION: Slight frontal atrophy bilaterally. Ventricles are normal in size
and configuration.

There is no mass, hemorrhage, or focal gray -white compartment
lesion.

There is multifocal paranasal sinus disease. Diffuse nasal turbinate
edema is present with obstruction of both nares. A degree of
underlying nasal polyposis is questioned.

## 2017-05-25 DIAGNOSIS — Z9889 Other specified postprocedural states: Secondary | ICD-10-CM | POA: Diagnosis not present

## 2017-05-25 DIAGNOSIS — Z48812 Encounter for surgical aftercare following surgery on the circulatory system: Secondary | ICD-10-CM | POA: Diagnosis not present

## 2017-05-27 DIAGNOSIS — Z48812 Encounter for surgical aftercare following surgery on the circulatory system: Secondary | ICD-10-CM | POA: Diagnosis not present

## 2017-05-27 DIAGNOSIS — Z9889 Other specified postprocedural states: Secondary | ICD-10-CM | POA: Diagnosis not present

## 2017-05-28 ENCOUNTER — Other Ambulatory Visit: Payer: Self-pay | Admitting: Cardiovascular Disease

## 2017-05-28 DIAGNOSIS — G4733 Obstructive sleep apnea (adult) (pediatric): Secondary | ICD-10-CM | POA: Diagnosis not present

## 2017-05-28 NOTE — Telephone Encounter (Signed)
Refill Request.  

## 2017-06-01 DIAGNOSIS — Z9889 Other specified postprocedural states: Secondary | ICD-10-CM | POA: Diagnosis not present

## 2017-06-01 DIAGNOSIS — Z48812 Encounter for surgical aftercare following surgery on the circulatory system: Secondary | ICD-10-CM | POA: Diagnosis not present

## 2017-06-07 ENCOUNTER — Telehealth: Payer: Self-pay | Admitting: *Deleted

## 2017-06-07 NOTE — Telephone Encounter (Signed)
Patient in 04 recall for 04/2017. Patient needs follow up breast imaging. Please contact patient regarding scheduling

## 2017-06-08 NOTE — Telephone Encounter (Signed)
Message left to return call to Emily at [phone].    

## 2017-06-08 NOTE — Telephone Encounter (Signed)
Routing to Karmen Bongo to please contact pt regarding [facility] follow-up.  Thanks.

## 2017-06-10 DIAGNOSIS — Z9889 Other specified postprocedural states: Secondary | ICD-10-CM | POA: Diagnosis not present

## 2017-06-10 DIAGNOSIS — Z48812 Encounter for surgical aftercare following surgery on the circulatory system: Secondary | ICD-10-CM | POA: Diagnosis not present

## 2017-06-14 ENCOUNTER — Inpatient Hospital Stay (HOSPITAL_COMMUNITY): Admit: 2017-06-14 | Payer: Self-pay | Admitting: Nurse Practitioner

## 2017-06-15 DIAGNOSIS — Z48812 Encounter for surgical aftercare following surgery on the circulatory system: Secondary | ICD-10-CM | POA: Diagnosis not present

## 2017-06-15 DIAGNOSIS — Z9889 Other specified postprocedural states: Secondary | ICD-10-CM | POA: Diagnosis not present

## 2017-06-17 DIAGNOSIS — Z9889 Other specified postprocedural states: Secondary | ICD-10-CM | POA: Diagnosis not present

## 2017-06-17 DIAGNOSIS — Z48812 Encounter for surgical aftercare following surgery on the circulatory system: Secondary | ICD-10-CM | POA: Diagnosis not present

## 2017-06-22 DIAGNOSIS — Z48812 Encounter for surgical aftercare following surgery on the circulatory system: Secondary | ICD-10-CM | POA: Diagnosis not present

## 2017-06-22 DIAGNOSIS — Z9889 Other specified postprocedural states: Secondary | ICD-10-CM | POA: Diagnosis not present

## 2017-06-22 NOTE — Telephone Encounter (Signed)
Message left to return call to Blayze Haen at [phone].    

## 2017-06-24 DIAGNOSIS — Z48812 Encounter for surgical aftercare following surgery on the circulatory system: Secondary | ICD-10-CM | POA: Diagnosis not present

## 2017-06-24 DIAGNOSIS — Z9889 Other specified postprocedural states: Secondary | ICD-10-CM | POA: Diagnosis not present

## 2017-06-24 NOTE — Telephone Encounter (Signed)
Kayla Hopkins- I have attempted to reach this patient twice in regards to MMG follow up. No return call from patient.

## 2017-06-24 NOTE — Telephone Encounter (Signed)
Please see message below and advise on recall status/letter Thanks

## 2017-06-24 NOTE — Telephone Encounter (Signed)
She and I discussed this at her last AEX in 2017.  She was aware of need for follow-up.  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  Ok to send letter and remove from recall.  Thanks.

## 2017-06-25 ENCOUNTER — Encounter: Payer: Self-pay | Admitting: *Deleted

## 2017-06-25 NOTE — Telephone Encounter (Signed)
Letter sent - removed from recall -eh 

## 2017-06-28 ENCOUNTER — Encounter: Payer: Self-pay | Admitting: Internal Medicine

## 2017-06-29 DIAGNOSIS — Z9889 Other specified postprocedural states: Secondary | ICD-10-CM | POA: Diagnosis not present

## 2017-06-29 DIAGNOSIS — Z48812 Encounter for surgical aftercare following surgery on the circulatory system: Secondary | ICD-10-CM | POA: Diagnosis not present

## 2017-07-01 ENCOUNTER — Ambulatory Visit: Payer: Self-pay | Admitting: Gastroenterology

## 2017-07-01 DIAGNOSIS — Z48812 Encounter for surgical aftercare following surgery on the circulatory system: Secondary | ICD-10-CM | POA: Diagnosis not present

## 2017-07-01 DIAGNOSIS — Z9889 Other specified postprocedural states: Secondary | ICD-10-CM | POA: Diagnosis not present

## 2017-07-06 ENCOUNTER — Ambulatory Visit (INDEPENDENT_AMBULATORY_CARE_PROVIDER_SITE_OTHER): Payer: Medicare HMO | Admitting: Pharmacist Clinician (PhC)/ Clinical Pharmacy Specialist

## 2017-07-06 DIAGNOSIS — Z48812 Encounter for surgical aftercare following surgery on the circulatory system: Secondary | ICD-10-CM | POA: Diagnosis not present

## 2017-07-06 DIAGNOSIS — Z9889 Other specified postprocedural states: Secondary | ICD-10-CM | POA: Diagnosis not present

## 2017-07-06 LAB — POCT INR: INR: 2.5

## 2017-07-08 DIAGNOSIS — Z48812 Encounter for surgical aftercare following surgery on the circulatory system: Secondary | ICD-10-CM | POA: Diagnosis not present

## 2017-07-08 DIAGNOSIS — Z9889 Other specified postprocedural states: Secondary | ICD-10-CM | POA: Diagnosis not present

## 2017-07-13 DIAGNOSIS — Z48812 Encounter for surgical aftercare following surgery on the circulatory system: Secondary | ICD-10-CM | POA: Diagnosis not present

## 2017-07-13 DIAGNOSIS — Z9889 Other specified postprocedural states: Secondary | ICD-10-CM | POA: Diagnosis not present

## 2017-07-15 DIAGNOSIS — Z9889 Other specified postprocedural states: Secondary | ICD-10-CM | POA: Diagnosis not present

## 2017-07-15 DIAGNOSIS — Z48812 Encounter for surgical aftercare following surgery on the circulatory system: Secondary | ICD-10-CM | POA: Diagnosis not present

## 2017-07-20 DIAGNOSIS — Z48812 Encounter for surgical aftercare following surgery on the circulatory system: Secondary | ICD-10-CM | POA: Diagnosis not present

## 2017-07-20 DIAGNOSIS — Z9889 Other specified postprocedural states: Secondary | ICD-10-CM | POA: Diagnosis not present

## 2017-07-22 DIAGNOSIS — Z48812 Encounter for surgical aftercare following surgery on the circulatory system: Secondary | ICD-10-CM | POA: Diagnosis not present

## 2017-07-22 DIAGNOSIS — Z9889 Other specified postprocedural states: Secondary | ICD-10-CM | POA: Diagnosis not present

## 2017-07-27 DIAGNOSIS — Z48812 Encounter for surgical aftercare following surgery on the circulatory system: Secondary | ICD-10-CM | POA: Diagnosis not present

## 2017-07-27 DIAGNOSIS — Z9889 Other specified postprocedural states: Secondary | ICD-10-CM | POA: Diagnosis not present

## 2017-08-03 ENCOUNTER — Ambulatory Visit (INDEPENDENT_AMBULATORY_CARE_PROVIDER_SITE_OTHER): Payer: Medicare HMO | Admitting: Pharmacist Clinician (PhC)/ Clinical Pharmacy Specialist

## 2017-08-03 DIAGNOSIS — Z7901 Long term (current) use of anticoagulants: Secondary | ICD-10-CM | POA: Diagnosis not present

## 2017-08-03 DIAGNOSIS — I481 Persistent atrial fibrillation: Secondary | ICD-10-CM | POA: Diagnosis not present

## 2017-08-03 DIAGNOSIS — I4819 Other persistent atrial fibrillation: Secondary | ICD-10-CM

## 2017-08-03 DIAGNOSIS — Z5181 Encounter for therapeutic drug level monitoring: Secondary | ICD-10-CM | POA: Diagnosis not present

## 2017-08-03 DIAGNOSIS — Z9889 Other specified postprocedural states: Secondary | ICD-10-CM

## 2017-08-03 LAB — POCT INR: INR: 3

## 2017-08-03 MED ORDER — POTASSIUM CHLORIDE CRYS ER 20 MEQ PO TBCR: 20.0000 meq | EXTENDED_RELEASE_TABLET | Freq: Every day | ORAL | 3 refills | Status: DC

## 2017-08-16 ENCOUNTER — Ambulatory Visit: Payer: Medicare HMO | Admitting: Obstetrics & Gynecology

## 2017-08-19 ENCOUNTER — Ambulatory Visit: Payer: Medicare HMO | Admitting: Obstetrics & Gynecology

## 2017-08-19 ENCOUNTER — Encounter: Payer: Self-pay | Admitting: Obstetrics & Gynecology

## 2017-08-19 NOTE — Progress Notes (Deleted)
56 y.o. G3P3 SingleCaucasianF here for annual exam.    Patient's last menstrual period was 11/09/2008.          Sexually active: {yes no:314532}  The current method of family planning is status post hysterectomy.    Exercising: {yes AS:341962}  {types:19826} Smoker:  Former smoker  Health Maintenance: Pap:  08/14/16 negative, 07/16/15  History of abnormal Pap:  {YES NO:22349} MMG:  *** Colonoscopy:  *** BMD:   *** TDaP:  *** Pneumonia vaccine(s):  *** Zostavax:   *** Hep C testing: *** Screening Labs: ***, Hb today: ***, Urine today: ***   reports that she quit smoking about 21 months ago. Her smoking use included Cigarettes. She has a 3.70 pack-year smoking history. She has never used smokeless tobacco. She reports that she drinks alcohol. She reports that she does not use drugs.  Past Medical History:  Diagnosis Date  . Anxiety   . Arthritis    "knees, back" (02/11/2016)  . Atrial fibrillation ([place])    a. CHA2DS2VASc = 4;  b. 12/10/2016 s/p Maze @ time of MVR/TVR.  [name] Bruises easily    d/t being on Eliquis  . Chronic back pain    stenosis  . Chronic diastolic CHF (congestive heart failure) ([place])    a. 02/2016 Echo; EF 55-60%, no rwma, mild to mod MR, mod dil LA, PASP 19mmHg;  b. 10/2016 TEE: nl EF, mod MR, mod-sev TR.  . Claudication ([place])       . Daily headache   . Depression   . GERD (gastroesophageal reflux disease)    takes Nexium nightly  . History of migraine    last one a couple of wks ago  . Hyperlipidemia    takes Atorvastatin daily  . Hypertension    takes Diltiazem and Diltiazem daily  . Migraine    "probably have 3/year; had one today; tried several medications-no relief" (02/11/2016)  . Moderate mitral regurgitation    a. 10/2016 TEE: nl EF, mod MR, mod to severe LAE, mod to sev TR; b. 12/10/2016 MVR (Sorin Carbomedics Annuloflex posterior annuloplasty band (87mm, ref# [id number], ser # [id number]).  . Moderate to Severe Tricuspid regurgitation    a. 10/2014 TEE:  mod-sev TR; b. 12/10/2016 s/p TVR w/ Edwards mc3 ring annuloplasty (31mm, model #4900, ser # [id number]).  . Non-obstructive CAD (coronary artery disease)    a. 11/2016 Cath: mRCA 10, otw nl cors, EF 55-65%, 3+MR.  [name] Obstructive sleep apnea    wears CPAP  . Peripheral vascular disease ([place]) 06/11/2009   a. 06/2009 Bilateral common iliac kissing stents (8X24 mm);  b. 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 She underwent successful balloon angioplasty;  c. Restenosis in RCIA in 12/13 s/p  Covered stent; d. 05/2013: 70% in distal left common iliac artery s/p self expanding stent placement; e. 08/2016 ABI: R 1.1, L 0.99, nl Abd Ao.  [name] Persistent atrial fibrillation ([facility])    a. CHA2DS2VASc = 4-->prev eliquis, now coumadin; b. s/p prior DCCV x 2;  c. failed medical therapy-->poor candidate for PVI 2/2 mod MR and LAE;  d. Complete bilateral atrial lesion set using cryothermy and bipolar radiofrequency ablation via median sternotomy with clipping of LA appendage.  [name] PTSD (post-traumatic stress disorder)   . Urinary frequency   . Urinary urgency   . Vertigo     Past Surgical History:  Procedure Laterality Date  . ABDOMINAL ANGIOGRAM N/A 05/11/2012   Procedure:  ABDOMINAL ANGIOGRAM;  Surgeon: [name], MD;  Location: [facility] CATH LAB;  Service: Cardiovascular;  Laterality: N/A;  . ABDOMINAL AORTAGRAM N/A 10/19/2012   Procedure: ABDOMINAL Maxcine Ham;  Surgeon: [name], MD;  Location: [place] CATH LAB;  Service: Cardiovascular;  Laterality: N/A;  . ABDOMINAL AORTAGRAM N/A 05/10/2013   Procedure: ABDOMINAL Maxcine Ham;  Surgeon: [name], MD;  Location: [place] CATH LAB;  Service: Cardiovascular;  Laterality: N/A;  . BACK SURGERY    . CARDIAC CATHETERIZATION  2009   no significant obstruction  . CARDIAC CATHETERIZATION N/A 12/02/2016   Procedure: Right/Left Heart Cath and Coronary Angiography;  Surgeon: [name], MD;  Location: [place] CV LAB;  Service: Cardiovascular;  Laterality: N/A;  . CARDIOVERSION N/A 03/19/2016   Procedure: CARDIOVERSION;  Surgeon: Pixie Casino, MD;  Location: [facility] ENDOSCOPY;  Service: Cardiovascular;  Laterality: N/A;  . CARDIOVERSION N/A 07/10/2016   Procedure: CARDIOVERSION;  Surgeon: Pixie Casino, MD;  Location: [facility] ENDOSCOPY;  Service: Cardiovascular;  Laterality: N/A;  . ILIAC ARTERY STENT Bilateral 06/2009   common iliac kissing stents (8X24 mm)  . KNEE ARTHROSCOPY Bilateral   . LUMBAR DISC SURGERY     spinal stenosis  . MAZE N/A 12/10/2016   Procedure: MAZE;  Surgeon: [name], MD;  Location: [place];  Service: Open Heart Surgery;  Laterality: N/A;  . MITRAL VALVE REPAIR N/A 12/10/2016   Procedure: MITRAL VALVE REPAIR (MVR);  Surgeon: [name], MD;  Location: [place];  Service: Open Heart Surgery;  Laterality: N/A;  . MULTIPLE EXTRACTIONS WITH ALVEOLOPLASTY N/A 11/12/2016   Procedure: EXTRACTION OF TOOTH #'S 18, 20-29, AND 31 WITH  ALVEOLOPLASTY AND BILATERAL MANDIBULAR TORI REDUCTIONS.;  Surgeon: Lenn Cal, DDS;  Location: [place];  Service: Oral Surgery;  Laterality: N/A;  . ROBOTIC ASSISTED TOTAL HYSTERECTOMY WITH SALPINGECTOMY Bilateral 09/23/2015   Procedure: ROBOTIC ASSISTED TOTAL HYSTERECTOMY BILATERALSALPINGECTOMY AND OOPHORECTOMY;  Surgeon: [name], MD;  Location: [place] [facility];  Service: Gynecology;  Laterality: Bilateral;  . TEE WITHOUT CARDIOVERSION N/A 10/13/2016   Procedure: TRANSESOPHAGEAL ECHOCARDIOGRAM (TEE);  Surgeon: Lelon Perla, MD;  Location: [facility] ENDOSCOPY;  Service: Cardiovascular;  Laterality: N/A;  . TEE WITHOUT CARDIOVERSION N/A 12/10/2016   Procedure: TRANSESOPHAGEAL ECHOCARDIOGRAM (TEE);  Surgeon: [name], MD;  Location: [place];  Service: Open Heart Surgery;  Laterality: N/A;  . TRICUSPID VALVE REPLACEMENT N/A 12/10/2016   Procedure: TRICUSPID VALVE REPAIR;  Surgeon: [name], MD;  Location: [place];  Service: Open Heart Surgery;   Laterality: N/A;  . TUBAL LIGATION  1991  . VULVECTOMY N/A 09/23/2015   Procedure: WIDE EXCISION VULVECTOMY  WLE of vulvar VIN 2;  Surgeon: [name], MD;  Location: [place] [facility];  Service: Gynecology;  Laterality: N/A;    Current Outpatient Prescriptions  Medication Sig Dispense Refill  . acetaminophen (TYLENOL) 500 MG tablet Take 1,000 mg by mouth every 6 (six) hours as needed for mild pain.    [name] atorvastatin (LIPITOR) 40 MG tablet Take 1 tablet (40 mg total) by mouth daily. (Patient taking differently: Take 40 mg by mouth daily at 6 PM. ) 90 tablet 3  . esomeprazole (NEXIUM) 20 MG capsule Take 20 mg by mouth at bedtime.     . furosemide (LASIX) 40 MG tablet Take 1 tablet (40 mg total) by mouth daily. 90 tablet 3  . iron polysaccharides (NIFEREX) 150 MG capsule Take 1 capsule (150 mg total) by mouth daily. 30 capsule 1  .  lisinopril (PRINIVIL,ZESTRIL) 2.5 MG tablet TAKE 1 TABLET EVERY DAY 90 tablet 2  . metoprolol succinate (TOPROL-XL) 25 MG 24 hr tablet Take 1 tablet (25 mg total) by mouth daily. 30 tablet 1  . OVER THE COUNTER MEDICATION Inhale 1 application into the lungs at bedtime. CPAP    . potassium chloride SA (K-DUR,KLOR-CON) 20 MEQ tablet Take 1 tablet (20 mEq total) by mouth daily. 90 tablet 3  . warfarin (COUMADIN) 4 MG tablet TAKE 1 TABLET DAILY OR AS DIRECTED BY COUMADIN CLINIC 90 tablet 0   No current facility-administered medications for this visit.     Family History  Problem Relation Age of Onset  . Hypertension Mother   . Ovarian cancer Mother   . Lung cancer Mother   . Heart disease Father   . Heart attack Father   . Hypertension Brother   . Colon polyps Sister   . Lung cancer Maternal Grandfather   . Throat cancer Paternal Grandfather     ROS:  Pertinent items are noted in HPI.  Otherwise, a comprehensive ROS was negative.  Exam:   LMP 11/09/2008   Weight change: @WEIGHTCHANGE @ Height:      Ht Readings from Last 3 Encounters:  04/07/17 5\' 6"  (1.676 m)   03/01/17 5\' 6"  (1.676 m)  02/22/17 5\' 6"  (1.676 m)    General appearance: alert, cooperative and appears stated age Head: Normocephalic, without obvious abnormality, atraumatic Neck: no adenopathy, supple, symmetrical, trachea midline and thyroid {EXAM; THYROID:18604} Lungs: clear to auscultation bilaterally Breasts: {Exam; breast:13139::"normal appearance, no masses or tenderness"} Heart: regular rate and rhythm Abdomen: soft, non-tender; bowel sounds normal; no masses,  no organomegaly Extremities: extremities normal, atraumatic, no cyanosis or edema Skin: Skin color, texture, turgor normal. No rashes or lesions Lymph nodes: Cervical, supraclavicular, and axillary nodes normal. No abnormal inguinal nodes palpated Neurologic: Grossly normal   Pelvic: External genitalia:  no lesions              Urethra:  normal appearing urethra with no masses, tenderness or lesions              Bartholins and Skenes: normal                 Vagina: normal appearing vagina with normal color and discharge, no lesions              Cervix: {exam; cervix:14595}              Pap taken: {yes no:314532} Bimanual Exam:  Uterus:  {exam; uterus:12215}              Adnexa: {exam; adnexa:12223}               Rectovaginal: Confirms               Anus:  normal sphincter tone, no lesions  Chaperone was present for exam.  A:  Well Woman with normal exam  P:   {plan; gyn:5269::"mammogram","pap smear","return annually or prn"}

## 2017-08-31 ENCOUNTER — Ambulatory Visit (INDEPENDENT_AMBULATORY_CARE_PROVIDER_SITE_OTHER): Payer: Medicare HMO | Admitting: Pharmacist Clinician (PhC)/ Clinical Pharmacy Specialist

## 2017-08-31 DIAGNOSIS — Z7901 Long term (current) use of anticoagulants: Secondary | ICD-10-CM | POA: Diagnosis not present

## 2017-08-31 DIAGNOSIS — Z9889 Other specified postprocedural states: Secondary | ICD-10-CM | POA: Diagnosis not present

## 2017-08-31 LAB — POCT INR: INR: 2.2

## 2017-09-02 ENCOUNTER — Other Ambulatory Visit: Payer: Self-pay | Admitting: *Deleted

## 2017-09-14 ENCOUNTER — Telehealth: Payer: Self-pay | Admitting: *Deleted

## 2017-09-14 DIAGNOSIS — N901 Moderate vulvar dysplasia: Secondary | ICD-10-CM

## 2017-09-14 NOTE — Telephone Encounter (Signed)
Message left to return call to Baby Gieger at [phone].    

## 2017-09-14 NOTE — Telephone Encounter (Signed)
Patient in 08 recall for 08/2017. Patient no showed her 08-19-17 AEX visit. Please contact patient regarding rescheduling.  Thanks

## 2017-09-16 ENCOUNTER — Other Ambulatory Visit: Payer: Self-pay | Admitting: Physician Assistant

## 2017-09-21 ENCOUNTER — Ambulatory Visit (INDEPENDENT_AMBULATORY_CARE_PROVIDER_SITE_OTHER): Payer: Medicare HMO | Admitting: Pharmacist Clinician (PhC)/ Clinical Pharmacy Specialist

## 2017-09-21 DIAGNOSIS — I481 Persistent atrial fibrillation: Secondary | ICD-10-CM

## 2017-09-21 DIAGNOSIS — Z9889 Other specified postprocedural states: Secondary | ICD-10-CM

## 2017-09-21 DIAGNOSIS — I4819 Other persistent atrial fibrillation: Secondary | ICD-10-CM

## 2017-09-21 DIAGNOSIS — Z7901 Long term (current) use of anticoagulants: Secondary | ICD-10-CM

## 2017-09-21 LAB — POCT INR: INR: 3.3

## 2017-09-21 MED ORDER — WARFARIN SODIUM 4 MG PO TABS: ORAL_TABLET | ORAL | 1 refills | Status: AC

## 2017-09-23 NOTE — Telephone Encounter (Signed)
Called patient. I was able to scheduled AEX and Colposcopy for 10/08/17 @10 :am.  Routed to [place] for Colpo order entry.

## 2017-09-23 NOTE — Telephone Encounter (Signed)
08 recall updated to 10-08-17.  Routing to provider for final review. Patient agreeable to disposition. Will close encounter.

## 2017-09-24 ENCOUNTER — Encounter: Payer: Self-pay | Admitting: Obstetrics & Gynecology

## 2017-10-08 ENCOUNTER — Other Ambulatory Visit: Payer: Self-pay

## 2017-10-08 ENCOUNTER — Encounter: Payer: Self-pay | Admitting: Obstetrics & Gynecology

## 2017-10-08 ENCOUNTER — Ambulatory Visit: Payer: Medicare HMO | Admitting: Obstetrics & Gynecology

## 2017-10-08 VITALS — BP 146/72 | HR 66 | Resp 14 | Ht 66.0 in | Wt 206.0 lb

## 2017-10-08 DIAGNOSIS — N764 Abscess of vulva: Secondary | ICD-10-CM | POA: Diagnosis not present

## 2017-10-08 DIAGNOSIS — Z01419 Encounter for gynecological examination (general) (routine) without abnormal findings: Secondary | ICD-10-CM | POA: Diagnosis not present

## 2017-10-08 DIAGNOSIS — Z1211 Encounter for screening for malignant neoplasm of colon: Secondary | ICD-10-CM

## 2017-10-08 DIAGNOSIS — N901 Moderate vulvar dysplasia: Secondary | ICD-10-CM | POA: Diagnosis not present

## 2017-10-08 DIAGNOSIS — Z01411 Encounter for gynecological examination (general) (routine) with abnormal findings: Secondary | ICD-10-CM

## 2017-10-08 MED ORDER — CEPHALEXIN 500 MG PO CAPS: 500.0000 mg | ORAL_CAPSULE | Freq: Four times a day (QID) | ORAL | 0 refills | Status: AC

## 2017-10-08 MED ORDER — VALACYCLOVIR HCL 500 MG PO TABS: 500.0000 mg | ORAL_TABLET | Freq: Every day | ORAL | 1 refills | Status: AC

## 2017-10-08 NOTE — Progress Notes (Signed)
56 y.o. G3P3 Single Caucasian F here for annual exam.  Doing well.  Has finally finished cardiac rehab after mitral valve repair.  On coumadin.  Denies any vulvar issues--itching, vaginal discharge, vaginal bleeding    Patient's last menstrual period was 11/09/2008.          Sexually active: Yes.    The current method of family planning is status post hysterectomy.    Exercising: Yes.    walking Smoker:  Former smoker  Health Maintenance: Pap:  07/16/15 negative, HR HPV negative  History of abnormal Pap:  no MMG:  03/26/15 BIRADS 3 probably benign, U/S same day BIRADS 3 probably benign.  Colonoscopy:  never  BMD:   never TDaP:  2017 Pneumonia vaccine(s):  12/16/16 Zostavax:   never Hep C testing: 04/07/17 negative  Screening Labs: PCP, Hb today: PCP, Urine today: has sample if needed    reports that she quit smoking about 22 months ago. Her smoking use included cigarettes. She has a 3.70 pack-year smoking history. she has never used smokeless tobacco. She reports that she drinks alcohol. She reports that she does not use drugs.  Past Medical History:  Diagnosis Date  . Anxiety   . Arthritis    "knees, back" (02/11/2016)  . Chronic back pain    stenosis  . Chronic diastolic CHF (congestive heart failure) ([place])    a. 02/2016 Echo; EF 55-60%, no rwma, mild to mod MR, mod dil LA, PASP 1mmHg;  b. 10/2016 TEE: nl EF, mod MR, mod-sev TR.  . Claudication ([place])       . Depression   . GERD (gastroesophageal reflux disease)    takes Nexium nightly  . History of atrial fibrillation    a. CHA2DS2VASc = 4-->prev eliquis, now coumadin; b. s/p prior DCCV x 2;  c. failed medical therapy-->poor candidate for PVI 2/2 mod MR and LAE;  d. Complete bilateral atrial lesion set using cryothermy and bipolar radiofrequency ablation via median sternotomy with clipping of LA appendage.  [name] History of migraine    last one a couple of wks ago  . Hyperlipidemia    takes Atorvastatin daily  . Hypertension   .  Moderate mitral regurgitation    a. 10/2016 TEE: nl EF, mod MR, mod to severe LAE, mod to sev TR; b. 12/10/2016 MVR (Sorin Carbomedics Annuloflex posterior annuloplasty band (44mm, ref# [id number], ser # [id number]).  . Moderate to Severe Tricuspid regurgitation    a. 10/2014 TEE: mod-sev TR; b. 12/10/2016 s/p TVR w/ Edwards mc3 ring annuloplasty (40mm, model #4900, ser # [id number]).  . Non-obstructive CAD (coronary artery disease)    a. 11/2016 Cath: mRCA 10, otw nl cors, EF 55-65%, 3+MR.  [name] Obstructive sleep apnea    wears CPAP  . Peripheral vascular disease ([place]) 06/11/2009   a. 06/2009 Bilateral common iliac kissing stents (8X24 mm);  b. 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 She underwent successful balloon angioplasty;  c. Restenosis in RCIA in 12/13 s/p  Covered stent; d. 05/2013: 70% in distal left common iliac artery s/p self expanding stent placement; e. 08/2016 ABI: R 1.1, L 0.99, nl Abd Ao.  [name] PTSD (post-traumatic stress disorder)   . Urinary frequency   . Urinary urgency   . Vertigo     Past Surgical History:  Procedure Laterality Date  . ABDOMINAL ANGIOGRAM N/A 05/11/2012   Procedure: ABDOMINAL ANGIOGRAM;  Surgeon: [name], MD;  Location: [facility] CATH  LAB;  Service: Cardiovascular;  Laterality: N/A;  . ABDOMINAL AORTAGRAM N/A 10/19/2012   Procedure: ABDOMINAL Maxcine Ham;  Surgeon: [name], MD;  Location: [place] CATH LAB;  Service: Cardiovascular;  Laterality: N/A;  . ABDOMINAL AORTAGRAM N/A 05/10/2013   Procedure: ABDOMINAL Maxcine Ham;  Surgeon: [name], MD;  Location: [place] CATH LAB;  Service: Cardiovascular;  Laterality: N/A;  . CARDIAC CATHETERIZATION  2009   no significant obstruction  . CARDIAC CATHETERIZATION N/A 12/02/2016   Procedure: Right/Left Heart Cath and Coronary Angiography;  Surgeon: [name], MD;  Location: [place] CV LAB;  Service: Cardiovascular;  Laterality: N/A;  . CARDIOVERSION  N/A 03/19/2016   Procedure: CARDIOVERSION;  Surgeon: Pixie Casino, MD;  Location: [facility] ENDOSCOPY;  Service: Cardiovascular;  Laterality: N/A;  . CARDIOVERSION N/A 07/10/2016   Procedure: CARDIOVERSION;  Surgeon: Pixie Casino, MD;  Location: [facility] ENDOSCOPY;  Service: Cardiovascular;  Laterality: N/A;  . ILIAC ARTERY STENT Bilateral 06/2009   common iliac kissing stents (8X24 mm)  . KNEE ARTHROSCOPY Bilateral   . LUMBAR DISC SURGERY  2010   spinal stenosis  . MAZE N/A 12/10/2016   Procedure: MAZE;  Surgeon: [name], MD;  Location: [place];  Service: Open Heart Surgery;  Laterality: N/A;  . MITRAL VALVE REPAIR N/A 12/10/2016   Procedure: MITRAL VALVE REPAIR (MVR);  Surgeon: [name], MD;  Location: [place];  Service: Open Heart Surgery;  Laterality: N/A;  . MULTIPLE EXTRACTIONS WITH ALVEOLOPLASTY N/A 11/12/2016   Procedure: EXTRACTION OF TOOTH #'S 18, 20-29, AND 31 WITH  ALVEOLOPLASTY AND BILATERAL MANDIBULAR TORI REDUCTIONS.;  Surgeon: Lenn Cal, DDS;  Location: [place];  Service: Oral Surgery;  Laterality: N/A;  . ROBOTIC ASSISTED TOTAL HYSTERECTOMY WITH SALPINGECTOMY Bilateral 09/23/2015   Procedure: ROBOTIC ASSISTED TOTAL HYSTERECTOMY BILATERALSALPINGECTOMY AND OOPHORECTOMY;  Surgeon: [name], MD;  Location: [place] [facility];  Service: Gynecology;  Laterality: Bilateral;  . TEE WITHOUT CARDIOVERSION N/A 10/13/2016   Procedure: TRANSESOPHAGEAL ECHOCARDIOGRAM (TEE);  Surgeon: Lelon Perla, MD;  Location: [facility] ENDOSCOPY;  Service: Cardiovascular;  Laterality: N/A;  . TEE WITHOUT CARDIOVERSION N/A 12/10/2016   Procedure: TRANSESOPHAGEAL ECHOCARDIOGRAM (TEE);  Surgeon: [name], MD;  Location: [place];  Service: Open Heart Surgery;  Laterality: N/A;  . TRICUSPID VALVE REPLACEMENT N/A 12/10/2016   Procedure: TRICUSPID VALVE REPAIR;  Surgeon: [name], MD;  Location: [place];  Service: Open Heart Surgery;  Laterality: N/A;  . TUBAL LIGATION  1991  . VULVECTOMY N/A 09/23/2015    Procedure: WIDE EXCISION VULVECTOMY  WLE of vulvar VIN 2;  Surgeon: [name], MD;  Location: [place] [facility];  Service: Gynecology;  Laterality: N/A;    Current Outpatient Medications  Medication Sig Dispense Refill  . acetaminophen (TYLENOL) 500 MG tablet Take 1,000 mg by mouth every 6 (six) hours as needed for mild pain.    [name] atorvastatin (LIPITOR) 40 MG tablet TAKE 1 TABLET (40 MG TOTAL) BY MOUTH DAILY. 90 tablet 1  . doxycycline (DORYX) 100 MG EC tablet Take 100 mg by mouth.    . esomeprazole (NEXIUM) 20 MG capsule Take 20 mg by mouth at bedtime.     . furosemide (LASIX) 40 MG tablet Take 40 mg by mouth.    . iron polysaccharides (NIFEREX) 150 MG capsule Take 1 capsule (150 mg total) by mouth daily. 30 capsule 1  . lisinopril (PRINIVIL,ZESTRIL) 2.5 MG tablet TAKE 1 TABLET EVERY DAY 90 tablet 2  . metoprolol succinate (TOPROL-XL) 25  MG 24 hr tablet Take 1 tablet (25 mg total) by mouth daily. 30 tablet 1  . OVER THE COUNTER MEDICATION Inhale 1 application into the lungs at bedtime. CPAP    . potassium chloride SA (K-DUR,KLOR-CON) 20 MEQ tablet Take 1 tablet (20 mEq total) by mouth daily. 90 tablet 3  . warfarin (COUMADIN) 4 MG tablet Take 1/2 to 1 tablet by mouth daily as directed by coumadin clinic 90 tablet 1  . valACYclovir (VALTREX) 500 MG tablet Take 1 tablet (500 mg total) by mouth daily. 30 tablet 1   No current facility-administered medications for this visit.     Family History  Problem Relation Age of Onset  . Hypertension Mother   . Ovarian cancer Mother   . Lung cancer Mother   . Heart disease Father   . Heart attack Father   . Hypertension Brother   . Colon polyps Sister   . Lung cancer Maternal Grandfather   . Throat cancer Paternal Grandfather     ROS:  Pertinent items are noted in HPI.  Otherwise, a comprehensive ROS was negative.  Exam:   BP (!) 146/72 (BP Location: Right Arm, Patient Position: Sitting, Cuff Size: Large)   Pulse 66   Resp 14   Ht 5\' 6"  (1.676 m)    Wt 206 lb (93.4 kg)   LMP 11/09/2008   BMI 33.25 kg/m   Weight change: -12#  Height: 5\' 6"  (167.6 cm)  Ht Readings from Last 3 Encounters:  10/08/17 5\' 6"  (1.676 m)  04/07/17 5\' 6"  (1.676 m)  03/01/17 5\' 6"  (1.676 m)    General appearance: alert, cooperative and appears stated age Head: Normocephalic, without obvious abnormality, atraumatic Neck: no adenopathy, supple, symmetrical, trachea midline and thyroid normal to inspection and palpation Lungs: clear to auscultation bilaterally Breasts: normal appearance, no masses or tenderness Heart: regular rate and rhythm Abdomen: soft, non-tender; bowel sounds normal; no masses,  no organomegaly Extremities: extremities normal, atraumatic, no cyanosis or edema Skin: Skin color, texture, turgor normal. No rashes or lesions Lymph nodes: Cervical, supraclavicular, and axillary nodes normal. No abnormal inguinal nodes palpated Neurologic: Grossly normal   Pelvic: External genitalia: firm area of draining abscess that is right in the inner thigh crease, with compression purulent material drained.  Culture obtained.              Urethra:  normal appearing urethra with no masses, tenderness or lesions              Bartholins and Skenes: normal                 Vagina: normal appearing vagina with normal color and discharge, no lesions              Cervix: absent              Pap taken: No. Bimanual Exam:  Uterus:  uterus absent              Adnexa: no mass, fullness, tenderness               Rectovaginal: Confirms               Anus:  normal sphincter tone, no lesions  Vulvar colposcopy.  Entire vulva was bathed with 3% acetic acid for 3 minutes before visualization of vulva with 7.5x magnifications.  Area of prior biopsy/excisoin noted.  No biopsies obtained.  Pt tolerated procedure well.   Chaperone was present for exam.  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  Will order cologuard. RF for valtrex 500mg  bid x 3 days with symptoms given.  Uses rarely. Wound culture pending.  Pt started on Keflex 500mg  qid x 5 days.  Results and recommendations will be made  the patient Return for vulvar colpo in six months. return annually or prn

## 2017-10-11 LAB — WOUND CULTURE

## 2017-10-26 ENCOUNTER — Ambulatory Visit (INDEPENDENT_AMBULATORY_CARE_PROVIDER_SITE_OTHER): Payer: Medicare HMO | Admitting: Pharmacist

## 2017-10-26 DIAGNOSIS — Z7901 Long term (current) use of anticoagulants: Secondary | ICD-10-CM

## 2017-10-26 DIAGNOSIS — Z9889 Other specified postprocedural states: Secondary | ICD-10-CM

## 2017-10-26 LAB — POCT INR: INR: 5

## 2017-11-04 ENCOUNTER — Telehealth: Payer: Self-pay | Admitting: Obstetrics & Gynecology

## 2017-11-04 DIAGNOSIS — Z1211 Encounter for screening for malignant neoplasm of colon: Secondary | ICD-10-CM | POA: Diagnosis not present

## 2017-11-04 DIAGNOSIS — Z1212 Encounter for screening for malignant neoplasm of rectum: Secondary | ICD-10-CM | POA: Diagnosis not present

## 2017-11-04 NOTE — Telephone Encounter (Signed)
Patient wants to make our office aware that she mailed the Cologuard packet off today.

## 2017-11-05 NOTE — Telephone Encounter (Signed)
Message noted. Will await results and call once MD receives and reviews.   Encounter closed.

## 2017-11-05 NOTE — Telephone Encounter (Signed)
Forwarding to Karmen Bongo, RN.

## 2017-11-15 ENCOUNTER — Other Ambulatory Visit: Payer: Self-pay | Admitting: *Deleted

## 2017-11-15 ENCOUNTER — Ambulatory Visit (INDEPENDENT_AMBULATORY_CARE_PROVIDER_SITE_OTHER): Payer: Medicare HMO | Admitting: Pharmacist

## 2017-11-15 ENCOUNTER — Telehealth: Payer: Self-pay | Admitting: *Deleted

## 2017-11-15 DIAGNOSIS — Z1211 Encounter for screening for malignant neoplasm of colon: Secondary | ICD-10-CM

## 2017-11-15 DIAGNOSIS — Z9889 Other specified postprocedural states: Secondary | ICD-10-CM

## 2017-11-15 LAB — COLOGUARD: Cologuard: NEGATIVE

## 2017-11-15 LAB — POCT INR: INR: 1.8

## 2017-11-15 NOTE — Telephone Encounter (Signed)
Attempted to reach patient on home phone # provided: [phone]. Error message stating, "the party you are trying to reach is not accepting calls at this time." Will attempt to reach out to patient again.

## 2017-11-16 NOTE — Telephone Encounter (Signed)
Attempted to reach patient again on home number provided on DPR: [phone]. Error message again stating, "the party you are trying to reach is not accepting calls at this time."

## 2017-11-25 DIAGNOSIS — Z23 Encounter for immunization: Secondary | ICD-10-CM | POA: Diagnosis not present

## 2017-11-25 DIAGNOSIS — H547 Unspecified visual loss: Secondary | ICD-10-CM | POA: Diagnosis not present

## 2017-11-25 DIAGNOSIS — I481 Persistent atrial fibrillation: Secondary | ICD-10-CM | POA: Diagnosis not present

## 2017-11-25 DIAGNOSIS — Z7901 Long term (current) use of anticoagulants: Secondary | ICD-10-CM | POA: Diagnosis not present

## 2017-11-25 DIAGNOSIS — K219 Gastro-esophageal reflux disease without esophagitis: Secondary | ICD-10-CM | POA: Diagnosis not present

## 2017-11-25 DIAGNOSIS — I10 Essential (primary) hypertension: Secondary | ICD-10-CM | POA: Diagnosis not present

## 2017-11-25 DIAGNOSIS — E785 Hyperlipidemia, unspecified: Secondary | ICD-10-CM | POA: Diagnosis not present

## 2017-11-25 DIAGNOSIS — I071 Rheumatic tricuspid insufficiency: Secondary | ICD-10-CM | POA: Diagnosis not present

## 2017-11-29 NOTE — Telephone Encounter (Signed)
Call to patient. Patient notified of negative cologuard results and verbalized understanding.   Patient agreeable to disposition. Will close encounter.

## 2017-11-30 DIAGNOSIS — Z23 Encounter for immunization: Secondary | ICD-10-CM | POA: Diagnosis not present

## 2017-12-06 ENCOUNTER — Ambulatory Visit (INDEPENDENT_AMBULATORY_CARE_PROVIDER_SITE_OTHER): Payer: Medicare HMO | Admitting: Pharmacist

## 2017-12-06 DIAGNOSIS — Z7901 Long term (current) use of anticoagulants: Secondary | ICD-10-CM | POA: Diagnosis not present

## 2017-12-06 DIAGNOSIS — Z9889 Other specified postprocedural states: Secondary | ICD-10-CM | POA: Diagnosis not present

## 2017-12-06 LAB — POCT INR: INR: 3.2

## 2017-12-15 DIAGNOSIS — G4733 Obstructive sleep apnea (adult) (pediatric): Secondary | ICD-10-CM | POA: Diagnosis not present

## 2017-12-21 ENCOUNTER — Ambulatory Visit: Payer: Self-pay | Admitting: Pharmacist

## 2017-12-21 DIAGNOSIS — I481 Persistent atrial fibrillation: Secondary | ICD-10-CM | POA: Diagnosis not present

## 2017-12-21 DIAGNOSIS — I071 Rheumatic tricuspid insufficiency: Secondary | ICD-10-CM | POA: Diagnosis not present

## 2017-12-21 DIAGNOSIS — Z9889 Other specified postprocedural states: Secondary | ICD-10-CM | POA: Diagnosis not present

## 2017-12-21 DIAGNOSIS — Z9989 Dependence on other enabling machines and devices: Secondary | ICD-10-CM | POA: Diagnosis not present

## 2017-12-21 DIAGNOSIS — I739 Peripheral vascular disease, unspecified: Secondary | ICD-10-CM | POA: Diagnosis not present

## 2017-12-21 DIAGNOSIS — G4733 Obstructive sleep apnea (adult) (pediatric): Secondary | ICD-10-CM | POA: Diagnosis not present

## 2017-12-21 DIAGNOSIS — Z5181 Encounter for therapeutic drug level monitoring: Secondary | ICD-10-CM | POA: Diagnosis not present

## 2017-12-21 DIAGNOSIS — Z7901 Long term (current) use of anticoagulants: Secondary | ICD-10-CM | POA: Diagnosis not present

## 2017-12-27 ENCOUNTER — Encounter: Payer: Self-pay | Admitting: Thoracic Surgery (Cardiothoracic Vascular Surgery)

## 2017-12-28 ENCOUNTER — Encounter: Payer: Self-pay | Admitting: Obstetrics & Gynecology

## 2018-01-03 ENCOUNTER — Encounter: Payer: Medicare HMO | Admitting: Thoracic Surgery (Cardiothoracic Vascular Surgery)

## 2018-01-11 DIAGNOSIS — Z952 Presence of prosthetic heart valve: Secondary | ICD-10-CM | POA: Diagnosis not present

## 2018-01-11 DIAGNOSIS — Z9889 Other specified postprocedural states: Secondary | ICD-10-CM | POA: Diagnosis not present

## 2018-01-11 DIAGNOSIS — I481 Persistent atrial fibrillation: Secondary | ICD-10-CM | POA: Diagnosis not present

## 2018-01-11 DIAGNOSIS — I371 Nonrheumatic pulmonary valve insufficiency: Secondary | ICD-10-CM | POA: Diagnosis not present

## 2018-01-25 DIAGNOSIS — I481 Persistent atrial fibrillation: Secondary | ICD-10-CM | POA: Diagnosis not present

## 2018-01-25 DIAGNOSIS — Z516 Encounter for desensitization to allergens: Secondary | ICD-10-CM | POA: Diagnosis not present

## 2018-01-25 DIAGNOSIS — Z7901 Long term (current) use of anticoagulants: Secondary | ICD-10-CM | POA: Diagnosis not present

## 2018-01-27 DIAGNOSIS — I110 Hypertensive heart disease with heart failure: Secondary | ICD-10-CM | POA: Diagnosis not present

## 2018-01-27 DIAGNOSIS — I5032 Chronic diastolic (congestive) heart failure: Secondary | ICD-10-CM | POA: Diagnosis not present

## 2018-01-27 DIAGNOSIS — L918 Other hypertrophic disorders of the skin: Secondary | ICD-10-CM | POA: Diagnosis not present

## 2018-01-27 DIAGNOSIS — Z23 Encounter for immunization: Secondary | ICD-10-CM | POA: Diagnosis not present

## 2018-01-27 DIAGNOSIS — I739 Peripheral vascular disease, unspecified: Secondary | ICD-10-CM | POA: Diagnosis not present

## 2018-01-27 DIAGNOSIS — Z7901 Long term (current) use of anticoagulants: Secondary | ICD-10-CM | POA: Diagnosis not present

## 2018-01-27 DIAGNOSIS — L570 Actinic keratosis: Secondary | ICD-10-CM | POA: Diagnosis not present

## 2018-02-03 DIAGNOSIS — Z95828 Presence of other vascular implants and grafts: Secondary | ICD-10-CM | POA: Diagnosis not present

## 2018-02-03 DIAGNOSIS — I771 Stricture of artery: Secondary | ICD-10-CM | POA: Diagnosis not present

## 2018-02-07 DIAGNOSIS — I739 Peripheral vascular disease, unspecified: Secondary | ICD-10-CM | POA: Diagnosis not present

## 2018-02-14 DIAGNOSIS — Z5181 Encounter for therapeutic drug level monitoring: Secondary | ICD-10-CM | POA: Diagnosis not present

## 2018-02-14 DIAGNOSIS — I4891 Unspecified atrial fibrillation: Secondary | ICD-10-CM | POA: Diagnosis not present

## 2018-02-14 DIAGNOSIS — Z7901 Long term (current) use of anticoagulants: Secondary | ICD-10-CM | POA: Diagnosis not present

## 2018-02-28 DIAGNOSIS — Z5181 Encounter for therapeutic drug level monitoring: Secondary | ICD-10-CM | POA: Diagnosis not present

## 2018-02-28 DIAGNOSIS — Z7901 Long term (current) use of anticoagulants: Secondary | ICD-10-CM | POA: Diagnosis not present

## 2018-02-28 DIAGNOSIS — I4891 Unspecified atrial fibrillation: Secondary | ICD-10-CM | POA: Diagnosis not present

## 2018-03-17 IMAGING — CR DG CHEST 2V
2 series · 2 of 2 positions shown · non-contrast
Comparison: 02/10/2016.

CLINICAL DATA: Heart valve repair.

EXAM:
CHEST  2 VIEW

[w chest pa]
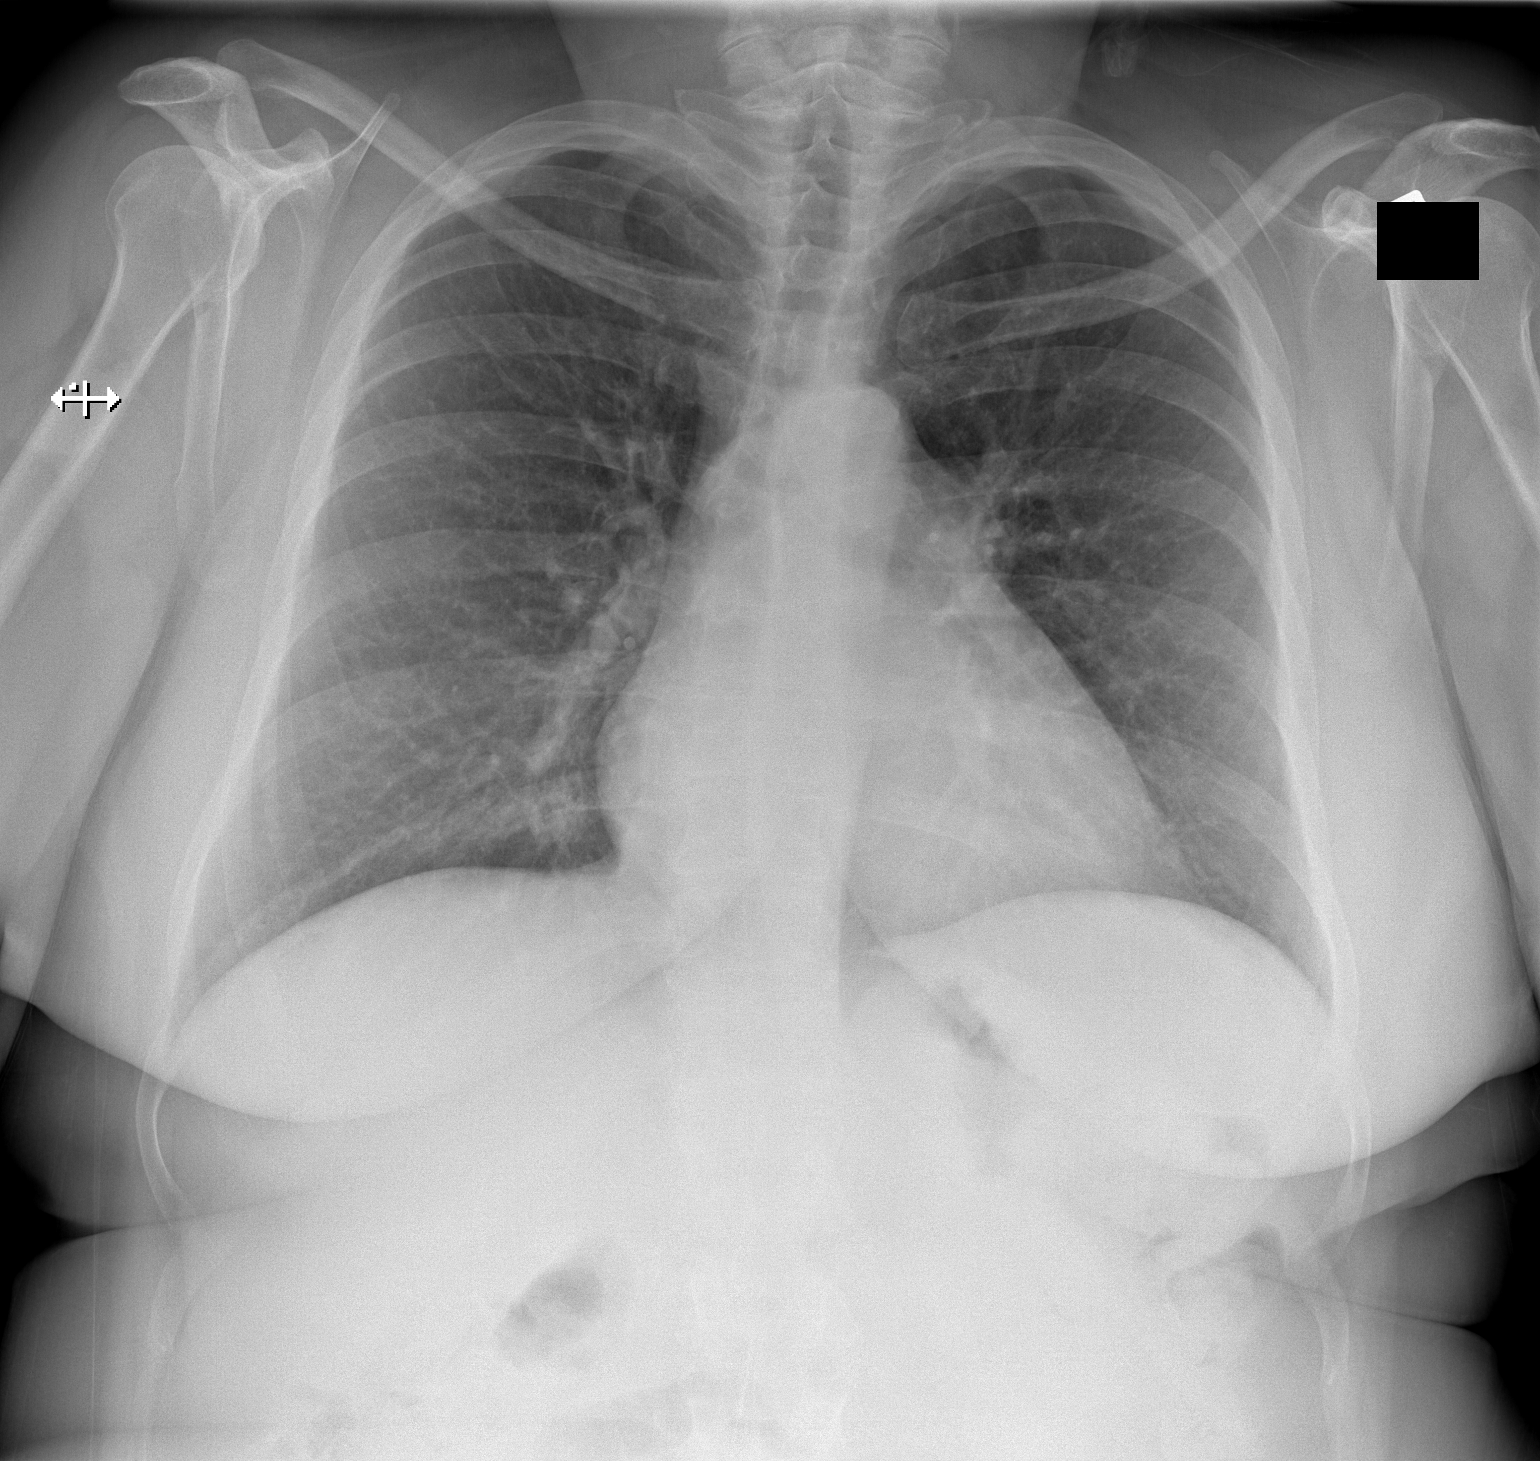

[w chest lat]
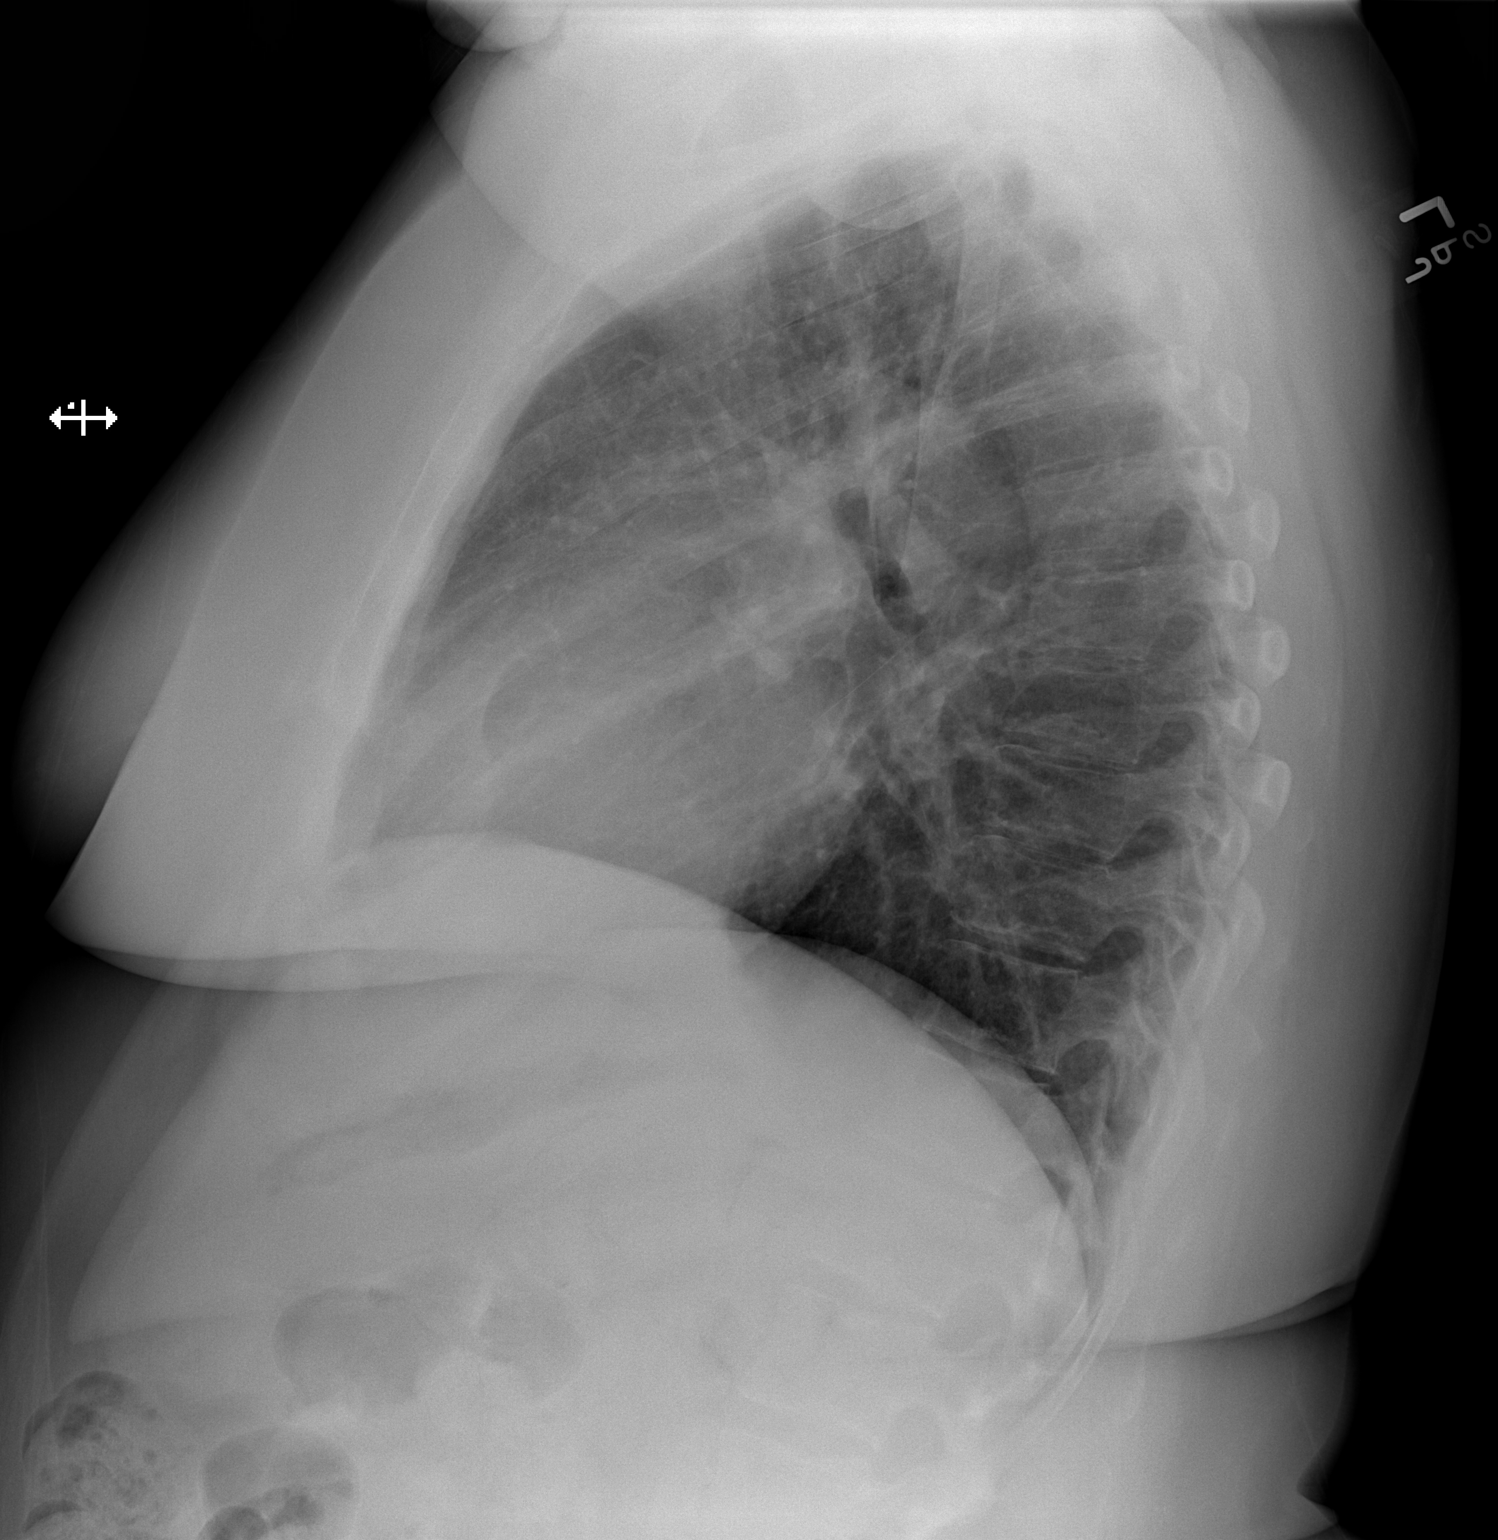

[2 of 2 positions shown; findings below may reference images not displayed]

FINDINGS: Mediastinum and hilar structures are normal. Mild cardiomegaly. No
pulmonary venous congestion . Mild right base subsegmental
atelectasis. No pleural effusion or pneumothorax .
IMPRESSION: 1.  Mild right base subsegmental atelectasis.

2.  Mild cardiomegaly.  No pulmonary venous congestion .

## 2018-03-20 IMAGING — DX DG CHEST 1V PORT
1 series · 1 of 1 positions shown · non-contrast
Comparison: Chest radiograph from one day prior.

CLINICAL DATA: Status post mitral valve replacement, chest tube.

EXAM:
PORTABLE CHEST 1 VIEW

[chest ap]
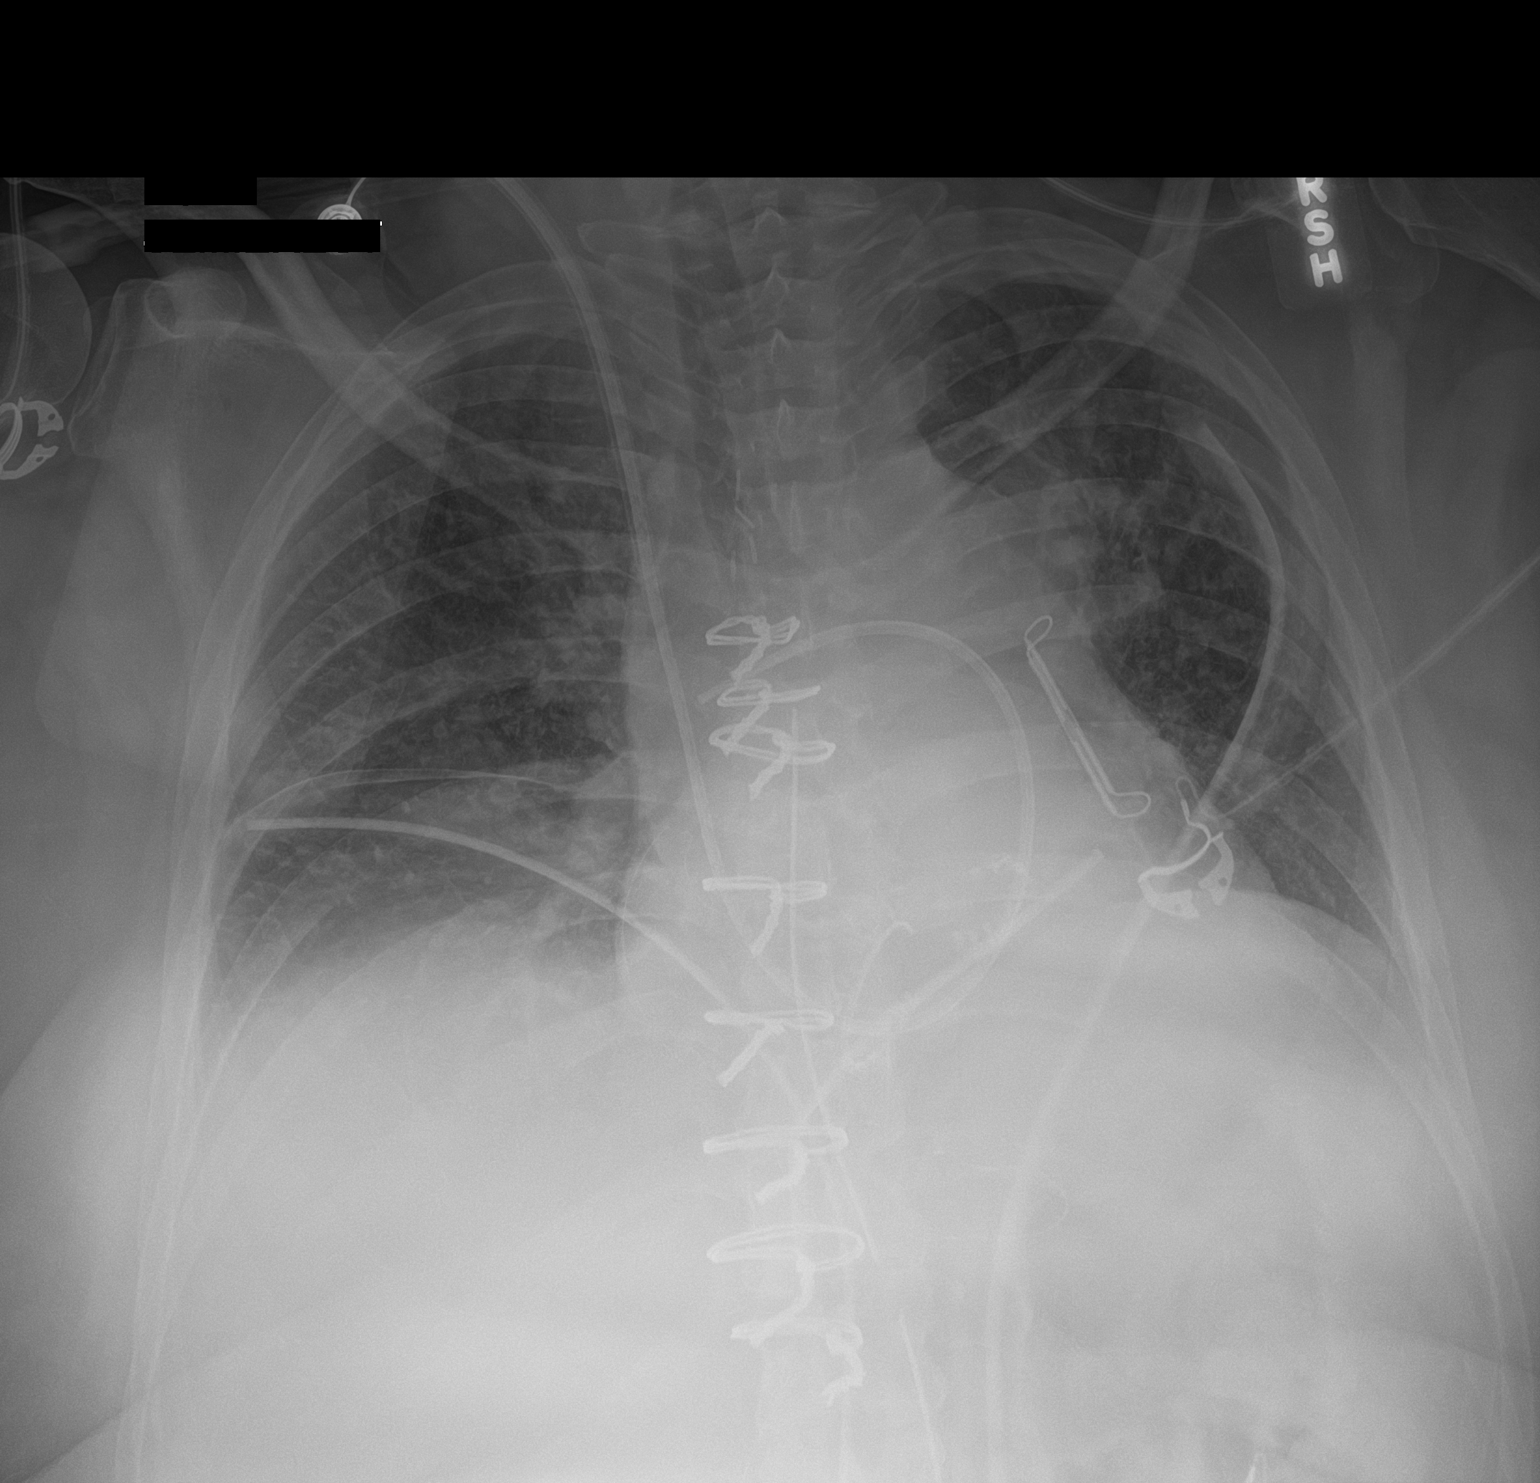

[1 of 1 positions shown; findings below may reference images not displayed]

FINDINGS: Right internal jugular Swan-Ganz catheter terminates over the right
pulmonary artery. Stable position of bilateral chest tubes,
mediastinal drains, median sternotomy wires and cardiac valve
annuloplasty ring. Stable cardiomediastinal silhouette with
top-normal heart size. No pneumothorax. No pleural effusion. Stable
low lung volumes. No overt pulmonary edema. Stable mild bibasilar
atelectasis.
IMPRESSION: Support structures in place.  No pneumothorax.

Stable low lung volumes with mild bibasilar atelectasis.

## 2018-03-21 DIAGNOSIS — Z5181 Encounter for therapeutic drug level monitoring: Secondary | ICD-10-CM | POA: Diagnosis not present

## 2018-03-21 DIAGNOSIS — Z7901 Long term (current) use of anticoagulants: Secondary | ICD-10-CM | POA: Diagnosis not present

## 2018-03-21 DIAGNOSIS — I4891 Unspecified atrial fibrillation: Secondary | ICD-10-CM | POA: Diagnosis not present

## 2018-03-21 IMAGING — CR DG CHEST 1V PORT
1 series · 1 of 1 positions shown · non-contrast
Comparison: Radiograph December 11, 2016.

CLINICAL DATA: Atelectasis.

EXAM:
PORTABLE CHEST 1 VIEW

[AP]
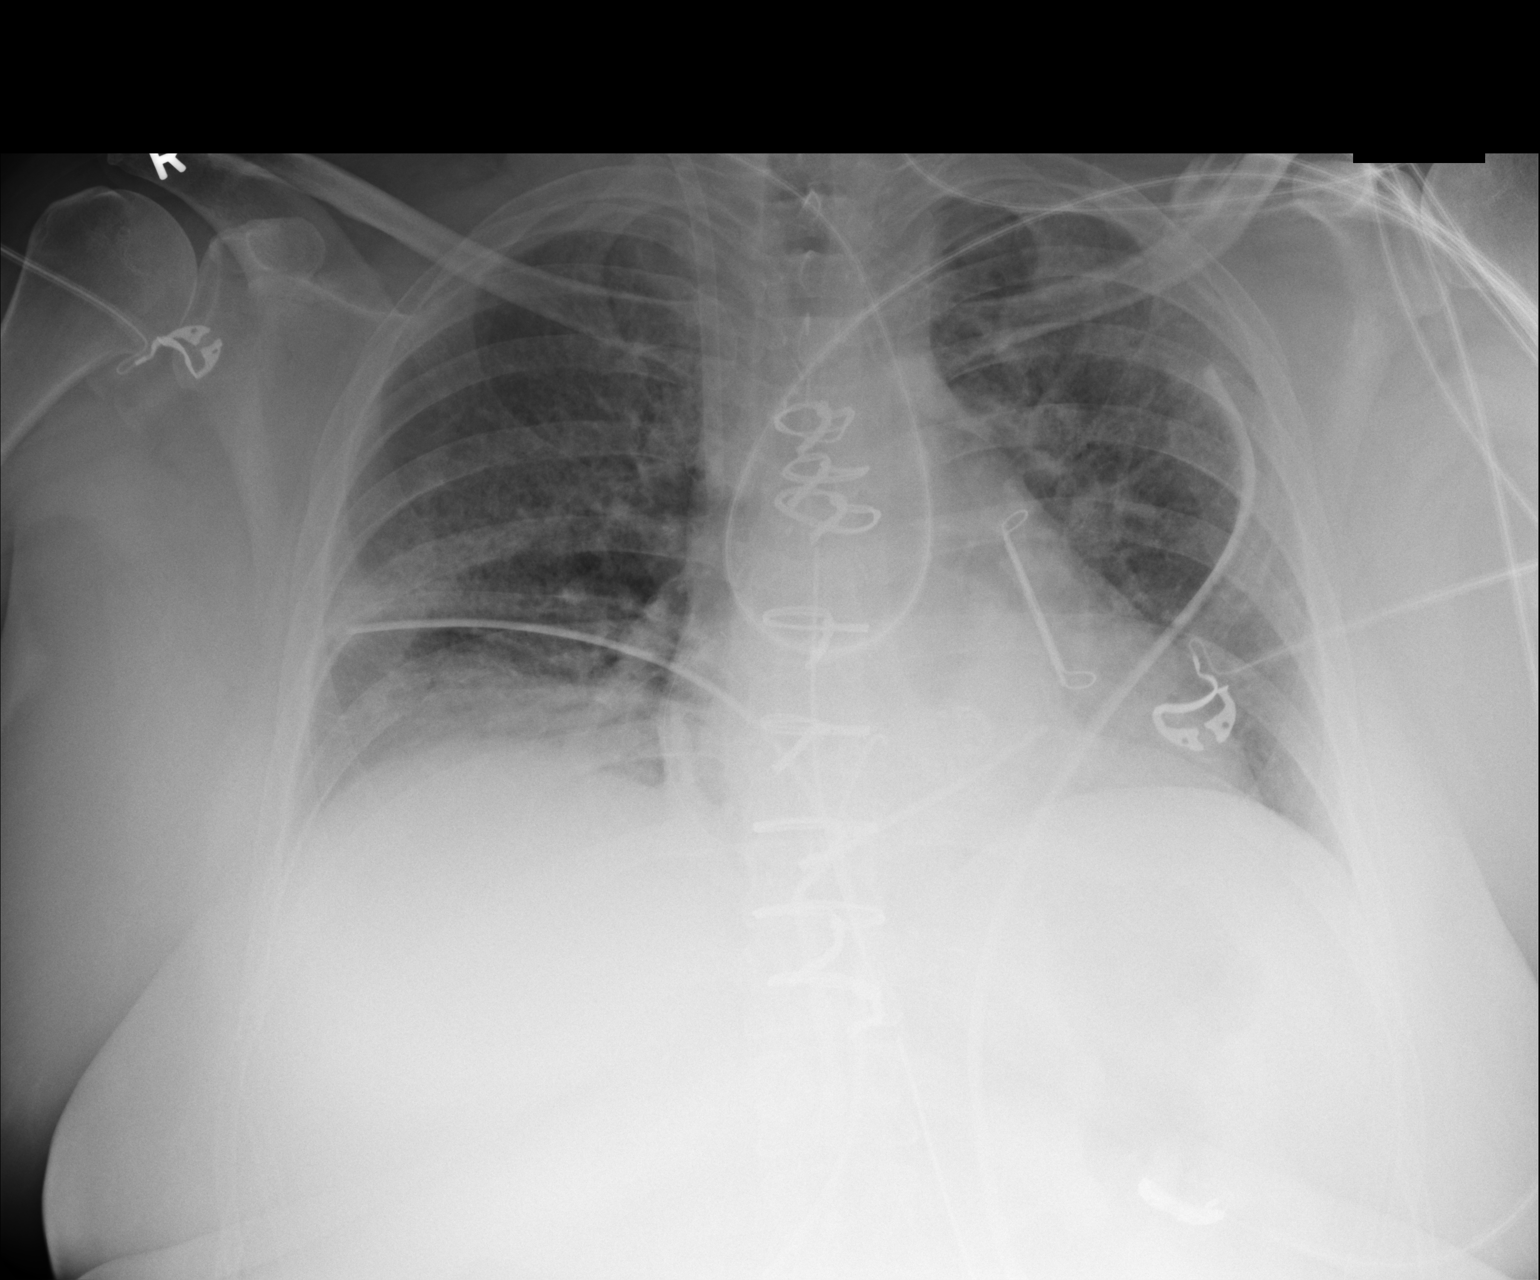

[1 of 1 positions shown; findings below may reference images not displayed]

FINDINGS: Stable cardiomediastinal silhouette. Sternotomy wires are noted.
Left-sided chest tube is again noted and unchanged with no
pneumothorax. Right-sided chest tube is also unchanged without
evidence of pneumothorax. Mild right basilar subsegmental
atelectasis or infiltrate is noted. Right internal jugular venous
sheath remains. Swan-Ganz catheter has been removed. Mediastinal
drain remains in position.
IMPRESSION: Bilateral chest tubes are noted without pneumothorax. Mild right
basilar subsegmental atelectasis or infiltrate is noted.

## 2018-04-01 ENCOUNTER — Other Ambulatory Visit: Payer: Self-pay | Admitting: *Deleted

## 2018-04-01 DIAGNOSIS — N901 Moderate vulvar dysplasia: Secondary | ICD-10-CM

## 2018-04-05 ENCOUNTER — Telehealth: Payer: Self-pay | Admitting: Obstetrics & Gynecology

## 2018-04-05 NOTE — Telephone Encounter (Signed)
Patient needs to reschedule her procedure that was scheduled for Thursday 5/30. She never got her antibiotic that she needed to take prior to the procedure. She is also going out of town.

## 2018-04-05 NOTE — Telephone Encounter (Signed)
Spoke with patient. Vulvar colpo rescheduled for 04/19/18 at 11:15am with Dr. Sabra Heck.   Patient states she did not get her pre-procedure abx. Has hx of heart valve repair, takes 3 days prior to procedure. Confirmed pharmacy on file, advised will review with Dr. Sabra Heck and return call.   Dr. Sabra Heck -please advise on RX?

## 2018-04-07 ENCOUNTER — Ambulatory Visit: Payer: Medicare HMO | Admitting: Obstetrics & Gynecology

## 2018-04-07 NOTE — Telephone Encounter (Signed)
I just reviewed Up To Date recommendations about this and antibiotic prophylaxis is not recommended for any GI or genitourinary procedure.  So, she does not need antibiotics.

## 2018-04-08 NOTE — Telephone Encounter (Signed)
Call placed to convey benefits. 

## 2018-04-08 NOTE — Telephone Encounter (Signed)
Left message to call Ailine Hefferan at [phone].  

## 2018-04-13 NOTE — Telephone Encounter (Signed)
Spoke with patient, advised as seen below per Dr. Sabra Heck. Patient verbalizes understanding and is agreeable. Business office unavailable, will forward message for return call regarding benefits. Patient verbalizes understanding and is agreeable. Will close encounter.   Routing to [employer].   Cc: Magdalene [name]

## 2018-04-13 NOTE — Telephone Encounter (Signed)
Left message to call Sharee Pimple at [phone].  Also attempted home number on file, fast busy signal.

## 2018-04-18 DIAGNOSIS — I4891 Unspecified atrial fibrillation: Secondary | ICD-10-CM | POA: Diagnosis not present

## 2018-04-18 DIAGNOSIS — Z7901 Long term (current) use of anticoagulants: Secondary | ICD-10-CM | POA: Diagnosis not present

## 2018-04-18 DIAGNOSIS — Z5181 Encounter for therapeutic drug level monitoring: Secondary | ICD-10-CM | POA: Diagnosis not present

## 2018-04-19 ENCOUNTER — Ambulatory Visit: Payer: Self-pay | Admitting: Obstetrics & Gynecology

## 2018-04-19 ENCOUNTER — Telehealth: Payer: Self-pay | Admitting: *Deleted

## 2018-04-19 NOTE — Telephone Encounter (Signed)
Left voicemail for patient. Need to reschedule appt for today

## 2018-04-25 ENCOUNTER — Telehealth: Payer: Self-pay | Admitting: *Deleted

## 2018-04-25 NOTE — Telephone Encounter (Signed)
Patient in 06 recall for 03/2018. Patient [facility] for 04-19-18 for a Vulvar Colposcopy. Patient has been left messages to call and R/S with no return call Please advise on recall status/letter Thanks

## 2018-05-09 DIAGNOSIS — I4891 Unspecified atrial fibrillation: Secondary | ICD-10-CM | POA: Diagnosis not present

## 2018-05-09 DIAGNOSIS — Z7901 Long term (current) use of anticoagulants: Secondary | ICD-10-CM | POA: Diagnosis not present

## 2018-05-09 DIAGNOSIS — Z5181 Encounter for therapeutic drug level monitoring: Secondary | ICD-10-CM | POA: Diagnosis not present

## 2018-05-23 DIAGNOSIS — I4891 Unspecified atrial fibrillation: Secondary | ICD-10-CM | POA: Diagnosis not present

## 2018-06-13 DIAGNOSIS — I4891 Unspecified atrial fibrillation: Secondary | ICD-10-CM | POA: Diagnosis not present

## 2018-06-21 DIAGNOSIS — Z09 Encounter for follow-up examination after completed treatment for conditions other than malignant neoplasm: Secondary | ICD-10-CM | POA: Diagnosis not present

## 2018-06-21 DIAGNOSIS — Z87891 Personal history of nicotine dependence: Secondary | ICD-10-CM | POA: Diagnosis not present

## 2018-06-21 DIAGNOSIS — G4733 Obstructive sleep apnea (adult) (pediatric): Secondary | ICD-10-CM | POA: Diagnosis not present

## 2018-06-21 DIAGNOSIS — Z888 Allergy status to other drugs, medicaments and biological substances status: Secondary | ICD-10-CM | POA: Diagnosis not present

## 2018-06-21 DIAGNOSIS — Z79899 Other long term (current) drug therapy: Secondary | ICD-10-CM | POA: Diagnosis not present

## 2018-06-21 DIAGNOSIS — Z8679 Personal history of other diseases of the circulatory system: Secondary | ICD-10-CM | POA: Diagnosis not present

## 2018-06-21 DIAGNOSIS — Z955 Presence of coronary angioplasty implant and graft: Secondary | ICD-10-CM | POA: Diagnosis not present

## 2018-06-21 DIAGNOSIS — Z9989 Dependence on other enabling machines and devices: Secondary | ICD-10-CM | POA: Diagnosis not present

## 2018-06-21 DIAGNOSIS — R001 Bradycardia, unspecified: Secondary | ICD-10-CM | POA: Diagnosis not present

## 2018-06-21 DIAGNOSIS — Z9889 Other specified postprocedural states: Secondary | ICD-10-CM | POA: Diagnosis not present

## 2018-06-21 DIAGNOSIS — Z7901 Long term (current) use of anticoagulants: Secondary | ICD-10-CM | POA: Diagnosis not present

## 2018-06-21 DIAGNOSIS — I481 Persistent atrial fibrillation: Secondary | ICD-10-CM | POA: Diagnosis not present

## 2018-06-21 DIAGNOSIS — I739 Peripheral vascular disease, unspecified: Secondary | ICD-10-CM | POA: Diagnosis not present

## 2018-07-03 DIAGNOSIS — K2900 Acute gastritis without bleeding: Secondary | ICD-10-CM | POA: Diagnosis not present

## 2018-07-03 DIAGNOSIS — R1013 Epigastric pain: Secondary | ICD-10-CM | POA: Diagnosis not present

## 2018-07-03 DIAGNOSIS — R160 Hepatomegaly, not elsewhere classified: Secondary | ICD-10-CM | POA: Diagnosis not present

## 2018-07-03 DIAGNOSIS — R197 Diarrhea, unspecified: Secondary | ICD-10-CM | POA: Diagnosis not present

## 2018-07-03 DIAGNOSIS — R1110 Vomiting, unspecified: Secondary | ICD-10-CM | POA: Diagnosis not present

## 2018-07-03 DIAGNOSIS — R1011 Right upper quadrant pain: Secondary | ICD-10-CM | POA: Diagnosis not present

## 2018-07-12 DIAGNOSIS — Z7901 Long term (current) use of anticoagulants: Secondary | ICD-10-CM | POA: Diagnosis not present

## 2018-07-12 DIAGNOSIS — Z5181 Encounter for therapeutic drug level monitoring: Secondary | ICD-10-CM | POA: Diagnosis not present

## 2018-07-12 DIAGNOSIS — I4891 Unspecified atrial fibrillation: Secondary | ICD-10-CM | POA: Diagnosis not present

## 2018-07-14 ENCOUNTER — Telehealth: Payer: Self-pay | Admitting: *Deleted

## 2018-07-14 NOTE — Telephone Encounter (Signed)
Follow-up call to patient. Phone number (home number) listed on DPR is out of service. Call to cell number, has first and last name confirmation.  Left message calling regarding an appointment. Requested call back to clinical supervisor, Gay Filler.

## 2018-07-25 DIAGNOSIS — Z7901 Long term (current) use of anticoagulants: Secondary | ICD-10-CM | POA: Diagnosis not present

## 2018-07-25 DIAGNOSIS — I4891 Unspecified atrial fibrillation: Secondary | ICD-10-CM | POA: Diagnosis not present

## 2018-07-25 DIAGNOSIS — Z5181 Encounter for therapeutic drug level monitoring: Secondary | ICD-10-CM | POA: Diagnosis not present

## 2018-08-08 DIAGNOSIS — I4891 Unspecified atrial fibrillation: Secondary | ICD-10-CM | POA: Diagnosis not present

## 2018-08-22 ENCOUNTER — Other Ambulatory Visit: Payer: Self-pay | Admitting: Cardiovascular Disease

## 2018-08-22 NOTE — Telephone Encounter (Signed)
Please review for refills.

## 2018-08-29 DIAGNOSIS — I4891 Unspecified atrial fibrillation: Secondary | ICD-10-CM | POA: Diagnosis not present

## 2018-08-31 ENCOUNTER — Encounter: Payer: Self-pay | Admitting: *Deleted

## 2018-08-31 NOTE — Telephone Encounter (Signed)
No response from patient. Letter written for your review and amendment.

## 2018-09-05 NOTE — Telephone Encounter (Signed)
Letter reviewed and signed by Dr Sabra Heck. Mailed 09-05-18.   Encounter closed.

## 2018-09-26 DIAGNOSIS — I4891 Unspecified atrial fibrillation: Secondary | ICD-10-CM | POA: Diagnosis not present

## 2018-09-28 ENCOUNTER — Telehealth: Payer: Self-pay | Admitting: Obstetrics & Gynecology

## 2018-09-28 NOTE — Telephone Encounter (Signed)
Appointment on 10-13-18 was annual and colpo that was scheduled last year on 09-26-17.  Letter was mailed to patient on 08-31-18 regarding recommended colposcopy.   Routing to Dr Sabra Heck for review.

## 2018-09-28 NOTE — Telephone Encounter (Signed)
Call placed to convey benefits for colposcopy. °

## 2018-09-28 NOTE — Telephone Encounter (Signed)
I spoke with patient to convey benefits for vulvar colposcopy. Patient states she already spoke with someone in the office and will not proceed with this procedure. The appointment for 10/13/18 has been cancelled per patients request.

## 2018-09-29 NOTE — Telephone Encounter (Signed)
Pt has already received a letter about the importance of this and that if left unevaluated, could cause death.  Ok to remove from recall.

## 2018-09-29 NOTE — Telephone Encounter (Signed)
Recalls completed. Encounter closed.

## 2018-10-13 ENCOUNTER — Ambulatory Visit: Payer: Medicare HMO | Admitting: Obstetrics & Gynecology

## 2018-10-13 DIAGNOSIS — L918 Other hypertrophic disorders of the skin: Secondary | ICD-10-CM | POA: Diagnosis not present

## 2018-10-13 DIAGNOSIS — Z1211 Encounter for screening for malignant neoplasm of colon: Secondary | ICD-10-CM | POA: Diagnosis not present

## 2018-10-13 DIAGNOSIS — D229 Melanocytic nevi, unspecified: Secondary | ICD-10-CM | POA: Diagnosis not present

## 2018-10-13 DIAGNOSIS — N901 Moderate vulvar dysplasia: Secondary | ICD-10-CM | POA: Diagnosis not present

## 2018-10-13 DIAGNOSIS — Z1231 Encounter for screening mammogram for malignant neoplasm of breast: Secondary | ICD-10-CM | POA: Diagnosis not present

## 2018-10-13 DIAGNOSIS — L570 Actinic keratosis: Secondary | ICD-10-CM | POA: Diagnosis not present

## 2018-10-13 DIAGNOSIS — F4310 Post-traumatic stress disorder, unspecified: Secondary | ICD-10-CM | POA: Diagnosis not present

## 2018-10-21 DIAGNOSIS — Z1231 Encounter for screening mammogram for malignant neoplasm of breast: Secondary | ICD-10-CM | POA: Diagnosis not present

## 2018-11-07 DIAGNOSIS — I4891 Unspecified atrial fibrillation: Secondary | ICD-10-CM | POA: Diagnosis not present

## 2018-11-21 DIAGNOSIS — H5203 Hypermetropia, bilateral: Secondary | ICD-10-CM | POA: Diagnosis not present

## 2018-11-24 DIAGNOSIS — Z87412 Personal history of vulvar dysplasia: Secondary | ICD-10-CM | POA: Diagnosis not present

## 2018-11-24 DIAGNOSIS — I509 Heart failure, unspecified: Secondary | ICD-10-CM | POA: Diagnosis not present

## 2018-11-24 DIAGNOSIS — Z87410 Personal history of cervical dysplasia: Secondary | ICD-10-CM | POA: Diagnosis not present

## 2018-12-05 DIAGNOSIS — I4891 Unspecified atrial fibrillation: Secondary | ICD-10-CM | POA: Diagnosis not present

## 2018-12-14 DIAGNOSIS — L814 Other melanin hyperpigmentation: Secondary | ICD-10-CM | POA: Diagnosis not present

## 2018-12-14 DIAGNOSIS — I781 Nevus, non-neoplastic: Secondary | ICD-10-CM | POA: Diagnosis not present

## 2018-12-14 DIAGNOSIS — C44619 Basal cell carcinoma of skin of left upper limb, including shoulder: Secondary | ICD-10-CM | POA: Diagnosis not present

## 2018-12-14 DIAGNOSIS — L820 Inflamed seborrheic keratosis: Secondary | ICD-10-CM | POA: Diagnosis not present

## 2018-12-14 DIAGNOSIS — D485 Neoplasm of uncertain behavior of skin: Secondary | ICD-10-CM | POA: Diagnosis not present

## 2018-12-14 DIAGNOSIS — L821 Other seborrheic keratosis: Secondary | ICD-10-CM | POA: Diagnosis not present

## 2018-12-26 DIAGNOSIS — Z5181 Encounter for therapeutic drug level monitoring: Secondary | ICD-10-CM | POA: Diagnosis not present

## 2018-12-26 DIAGNOSIS — Z7901 Long term (current) use of anticoagulants: Secondary | ICD-10-CM | POA: Diagnosis not present

## 2018-12-26 DIAGNOSIS — I4891 Unspecified atrial fibrillation: Secondary | ICD-10-CM | POA: Diagnosis not present

## 2019-01-10 DIAGNOSIS — R42 Dizziness and giddiness: Secondary | ICD-10-CM | POA: Diagnosis not present

## 2019-01-10 DIAGNOSIS — Z87891 Personal history of nicotine dependence: Secondary | ICD-10-CM | POA: Diagnosis not present

## 2019-01-10 DIAGNOSIS — I480 Paroxysmal atrial fibrillation: Secondary | ICD-10-CM | POA: Diagnosis not present

## 2019-01-10 DIAGNOSIS — G4733 Obstructive sleep apnea (adult) (pediatric): Secondary | ICD-10-CM | POA: Diagnosis not present

## 2019-01-10 DIAGNOSIS — Z9989 Dependence on other enabling machines and devices: Secondary | ICD-10-CM | POA: Diagnosis not present

## 2019-01-10 DIAGNOSIS — I4819 Other persistent atrial fibrillation: Secondary | ICD-10-CM | POA: Diagnosis not present

## 2019-01-10 DIAGNOSIS — I7389 Other specified peripheral vascular diseases: Secondary | ICD-10-CM | POA: Diagnosis not present

## 2019-01-10 DIAGNOSIS — Z9889 Other specified postprocedural states: Secondary | ICD-10-CM | POA: Diagnosis not present

## 2019-01-10 DIAGNOSIS — Z95828 Presence of other vascular implants and grafts: Secondary | ICD-10-CM | POA: Diagnosis not present

## 2019-01-10 DIAGNOSIS — Z7901 Long term (current) use of anticoagulants: Secondary | ICD-10-CM | POA: Diagnosis not present

## 2019-01-10 DIAGNOSIS — I739 Peripheral vascular disease, unspecified: Secondary | ICD-10-CM | POA: Diagnosis not present

## 2019-01-10 DIAGNOSIS — Z79899 Other long term (current) drug therapy: Secondary | ICD-10-CM | POA: Diagnosis not present

## 2019-01-22 DIAGNOSIS — I480 Paroxysmal atrial fibrillation: Secondary | ICD-10-CM | POA: Diagnosis not present

## 2019-01-22 DIAGNOSIS — R42 Dizziness and giddiness: Secondary | ICD-10-CM | POA: Diagnosis not present

## 2019-01-23 DIAGNOSIS — I482 Chronic atrial fibrillation, unspecified: Secondary | ICD-10-CM | POA: Diagnosis not present

## 2019-01-24 DIAGNOSIS — R42 Dizziness and giddiness: Secondary | ICD-10-CM | POA: Diagnosis not present

## 2019-01-24 DIAGNOSIS — I471 Supraventricular tachycardia: Secondary | ICD-10-CM | POA: Diagnosis not present

## 2019-02-23 DIAGNOSIS — Z5181 Encounter for therapeutic drug level monitoring: Secondary | ICD-10-CM | POA: Diagnosis not present

## 2019-02-23 DIAGNOSIS — I482 Chronic atrial fibrillation, unspecified: Secondary | ICD-10-CM | POA: Diagnosis not present

## 2019-02-23 DIAGNOSIS — Z7901 Long term (current) use of anticoagulants: Secondary | ICD-10-CM | POA: Diagnosis not present

## 2020-12-17 LAB — EXTERNAL GENERIC LAB PROCEDURE: COLOGUARD: POSITIVE — AB
